# Patient Record
Sex: Male | Born: 1945 | Race: Black or African American | Hispanic: No | Marital: Married | Smoking: Former smoker
Health system: Southern US, Community
[De-identification: ages and names within clinical notes are randomized; demographics above are authoritative.]

## PROBLEM LIST (undated history)

## (undated) DIAGNOSIS — K7460 Unspecified cirrhosis of liver: Secondary | ICD-10-CM

## (undated) DIAGNOSIS — F419 Anxiety disorder, unspecified: Secondary | ICD-10-CM

## (undated) DIAGNOSIS — R0609 Other forms of dyspnea: Secondary | ICD-10-CM

## (undated) DIAGNOSIS — G4730 Sleep apnea, unspecified: Secondary | ICD-10-CM

## (undated) DIAGNOSIS — I272 Pulmonary hypertension, unspecified: Secondary | ICD-10-CM

## (undated) DIAGNOSIS — Z95810 Presence of automatic (implantable) cardiac defibrillator: Secondary | ICD-10-CM

## (undated) DIAGNOSIS — Z8489 Family history of other specified conditions: Secondary | ICD-10-CM

## (undated) DIAGNOSIS — I10 Essential (primary) hypertension: Secondary | ICD-10-CM

## (undated) DIAGNOSIS — I639 Cerebral infarction, unspecified: Secondary | ICD-10-CM

## (undated) DIAGNOSIS — E785 Hyperlipidemia, unspecified: Secondary | ICD-10-CM

## (undated) DIAGNOSIS — I2510 Atherosclerotic heart disease of native coronary artery without angina pectoris: Secondary | ICD-10-CM

## (undated) DIAGNOSIS — I219 Acute myocardial infarction, unspecified: Secondary | ICD-10-CM

## (undated) DIAGNOSIS — I255 Ischemic cardiomyopathy: Secondary | ICD-10-CM

## (undated) DIAGNOSIS — R0600 Dyspnea, unspecified: Secondary | ICD-10-CM

## (undated) DIAGNOSIS — I509 Heart failure, unspecified: Secondary | ICD-10-CM

## (undated) DIAGNOSIS — I071 Rheumatic tricuspid insufficiency: Secondary | ICD-10-CM

## (undated) DIAGNOSIS — I2699 Other pulmonary embolism without acute cor pulmonale: Secondary | ICD-10-CM

## (undated) HISTORY — PX: CORONARY ANGIOPLASTY WITH STENT PLACEMENT: SHX49

## (undated) HISTORY — DX: Other pulmonary embolism without acute cor pulmonale: I26.99

## (undated) HISTORY — DX: Ischemic cardiomyopathy: I25.5

## (undated) HISTORY — DX: Other forms of dyspnea: R06.09

## (undated) HISTORY — DX: Essential (primary) hypertension: I10

## (undated) HISTORY — DX: Rheumatic tricuspid insufficiency: I07.1

## (undated) HISTORY — DX: Hyperlipidemia, unspecified: E78.5

## (undated) HISTORY — DX: Acute myocardial infarction, unspecified: I21.9

## (undated) HISTORY — DX: Pulmonary hypertension, unspecified: I27.20

## (undated) HISTORY — DX: Atherosclerotic heart disease of native coronary artery without angina pectoris: I25.10

## (undated) HISTORY — DX: Anxiety disorder, unspecified: F41.9

## (undated) HISTORY — DX: Dyspnea, unspecified: R06.00

---

## 2001-06-05 DIAGNOSIS — I219 Acute myocardial infarction, unspecified: Secondary | ICD-10-CM

## 2001-06-05 HISTORY — DX: Acute myocardial infarction, unspecified: I21.9

## 2001-06-05 HISTORY — PX: CORONARY ARTERY BYPASS GRAFT: SHX141

## 2006-11-09 HISTORY — PX: CARDIAC DEFIBRILLATOR PLACEMENT: SHX171

## 2011-06-21 DIAGNOSIS — I252 Old myocardial infarction: Secondary | ICD-10-CM | POA: Diagnosis not present

## 2011-06-21 DIAGNOSIS — I509 Heart failure, unspecified: Secondary | ICD-10-CM | POA: Diagnosis not present

## 2011-06-21 DIAGNOSIS — I428 Other cardiomyopathies: Secondary | ICD-10-CM | POA: Diagnosis not present

## 2011-06-21 DIAGNOSIS — I5022 Chronic systolic (congestive) heart failure: Secondary | ICD-10-CM | POA: Diagnosis not present

## 2011-06-21 DIAGNOSIS — Z95810 Presence of automatic (implantable) cardiac defibrillator: Secondary | ICD-10-CM | POA: Diagnosis not present

## 2011-06-23 DIAGNOSIS — R9431 Abnormal electrocardiogram [ECG] [EKG]: Secondary | ICD-10-CM | POA: Diagnosis not present

## 2011-06-23 DIAGNOSIS — R0602 Shortness of breath: Secondary | ICD-10-CM | POA: Diagnosis not present

## 2011-06-23 DIAGNOSIS — I509 Heart failure, unspecified: Secondary | ICD-10-CM | POA: Diagnosis not present

## 2011-06-23 DIAGNOSIS — I2699 Other pulmonary embolism without acute cor pulmonale: Secondary | ICD-10-CM | POA: Diagnosis not present

## 2011-06-23 DIAGNOSIS — R079 Chest pain, unspecified: Secondary | ICD-10-CM | POA: Diagnosis not present

## 2011-06-24 DIAGNOSIS — I509 Heart failure, unspecified: Secondary | ICD-10-CM | POA: Diagnosis present

## 2011-06-24 DIAGNOSIS — I2699 Other pulmonary embolism without acute cor pulmonale: Secondary | ICD-10-CM | POA: Diagnosis not present

## 2011-06-24 DIAGNOSIS — E785 Hyperlipidemia, unspecified: Secondary | ICD-10-CM | POA: Diagnosis present

## 2011-06-24 DIAGNOSIS — I2589 Other forms of chronic ischemic heart disease: Secondary | ICD-10-CM | POA: Diagnosis present

## 2011-06-24 DIAGNOSIS — I129 Hypertensive chronic kidney disease with stage 1 through stage 4 chronic kidney disease, or unspecified chronic kidney disease: Secondary | ICD-10-CM | POA: Diagnosis present

## 2011-06-24 DIAGNOSIS — J90 Pleural effusion, not elsewhere classified: Secondary | ICD-10-CM | POA: Diagnosis not present

## 2011-06-24 DIAGNOSIS — R0602 Shortness of breath: Secondary | ICD-10-CM | POA: Diagnosis not present

## 2011-06-24 DIAGNOSIS — I2510 Atherosclerotic heart disease of native coronary artery without angina pectoris: Secondary | ICD-10-CM | POA: Diagnosis not present

## 2011-06-24 DIAGNOSIS — Z9861 Coronary angioplasty status: Secondary | ICD-10-CM | POA: Diagnosis not present

## 2011-06-24 DIAGNOSIS — N189 Chronic kidney disease, unspecified: Secondary | ICD-10-CM | POA: Diagnosis not present

## 2011-06-24 DIAGNOSIS — D689 Coagulation defect, unspecified: Secondary | ICD-10-CM | POA: Diagnosis not present

## 2011-06-24 DIAGNOSIS — I5022 Chronic systolic (congestive) heart failure: Secondary | ICD-10-CM | POA: Diagnosis present

## 2011-06-24 DIAGNOSIS — R7989 Other specified abnormal findings of blood chemistry: Secondary | ICD-10-CM | POA: Diagnosis present

## 2011-06-24 DIAGNOSIS — R071 Chest pain on breathing: Secondary | ICD-10-CM | POA: Diagnosis not present

## 2011-06-28 DIAGNOSIS — I428 Other cardiomyopathies: Secondary | ICD-10-CM | POA: Diagnosis not present

## 2011-06-28 DIAGNOSIS — I2699 Other pulmonary embolism without acute cor pulmonale: Secondary | ICD-10-CM | POA: Diagnosis not present

## 2011-06-28 DIAGNOSIS — D689 Coagulation defect, unspecified: Secondary | ICD-10-CM | POA: Diagnosis not present

## 2011-06-28 DIAGNOSIS — I10 Essential (primary) hypertension: Secondary | ICD-10-CM | POA: Diagnosis not present

## 2011-06-28 DIAGNOSIS — I2510 Atherosclerotic heart disease of native coronary artery without angina pectoris: Secondary | ICD-10-CM | POA: Diagnosis not present

## 2011-06-29 DIAGNOSIS — I428 Other cardiomyopathies: Secondary | ICD-10-CM | POA: Diagnosis not present

## 2011-06-29 DIAGNOSIS — I2699 Other pulmonary embolism without acute cor pulmonale: Secondary | ICD-10-CM | POA: Diagnosis not present

## 2011-06-29 DIAGNOSIS — I10 Essential (primary) hypertension: Secondary | ICD-10-CM | POA: Diagnosis not present

## 2011-06-29 DIAGNOSIS — I2510 Atherosclerotic heart disease of native coronary artery without angina pectoris: Secondary | ICD-10-CM | POA: Diagnosis not present

## 2011-06-29 DIAGNOSIS — D689 Coagulation defect, unspecified: Secondary | ICD-10-CM | POA: Diagnosis not present

## 2011-06-30 DIAGNOSIS — D689 Coagulation defect, unspecified: Secondary | ICD-10-CM | POA: Diagnosis not present

## 2011-06-30 DIAGNOSIS — I428 Other cardiomyopathies: Secondary | ICD-10-CM | POA: Diagnosis not present

## 2011-06-30 DIAGNOSIS — I2699 Other pulmonary embolism without acute cor pulmonale: Secondary | ICD-10-CM | POA: Diagnosis not present

## 2011-06-30 DIAGNOSIS — I10 Essential (primary) hypertension: Secondary | ICD-10-CM | POA: Diagnosis not present

## 2011-06-30 DIAGNOSIS — I2510 Atherosclerotic heart disease of native coronary artery without angina pectoris: Secondary | ICD-10-CM | POA: Diagnosis not present

## 2011-07-04 DIAGNOSIS — Z7901 Long term (current) use of anticoagulants: Secondary | ICD-10-CM | POA: Diagnosis not present

## 2011-07-11 DIAGNOSIS — Z7901 Long term (current) use of anticoagulants: Secondary | ICD-10-CM | POA: Diagnosis not present

## 2011-07-25 DIAGNOSIS — I2699 Other pulmonary embolism without acute cor pulmonale: Secondary | ICD-10-CM | POA: Diagnosis not present

## 2011-07-25 DIAGNOSIS — I2510 Atherosclerotic heart disease of native coronary artery without angina pectoris: Secondary | ICD-10-CM | POA: Diagnosis not present

## 2011-07-25 DIAGNOSIS — Z7901 Long term (current) use of anticoagulants: Secondary | ICD-10-CM | POA: Diagnosis not present

## 2011-07-25 DIAGNOSIS — R0989 Other specified symptoms and signs involving the circulatory and respiratory systems: Secondary | ICD-10-CM | POA: Diagnosis not present

## 2011-08-11 DIAGNOSIS — Z7901 Long term (current) use of anticoagulants: Secondary | ICD-10-CM | POA: Diagnosis not present

## 2011-08-14 DIAGNOSIS — Z7901 Long term (current) use of anticoagulants: Secondary | ICD-10-CM | POA: Diagnosis not present

## 2011-08-16 DIAGNOSIS — Z7901 Long term (current) use of anticoagulants: Secondary | ICD-10-CM | POA: Diagnosis not present

## 2011-09-13 DIAGNOSIS — I509 Heart failure, unspecified: Secondary | ICD-10-CM | POA: Diagnosis not present

## 2011-09-13 DIAGNOSIS — Z95810 Presence of automatic (implantable) cardiac defibrillator: Secondary | ICD-10-CM | POA: Diagnosis not present

## 2011-09-16 DIAGNOSIS — R05 Cough: Secondary | ICD-10-CM | POA: Diagnosis not present

## 2011-09-16 DIAGNOSIS — I428 Other cardiomyopathies: Secondary | ICD-10-CM | POA: Diagnosis not present

## 2011-09-16 DIAGNOSIS — I5022 Chronic systolic (congestive) heart failure: Secondary | ICD-10-CM | POA: Diagnosis not present

## 2011-09-16 DIAGNOSIS — J90 Pleural effusion, not elsewhere classified: Secondary | ICD-10-CM | POA: Diagnosis not present

## 2011-09-16 DIAGNOSIS — R0609 Other forms of dyspnea: Secondary | ICD-10-CM | POA: Diagnosis not present

## 2011-09-22 DIAGNOSIS — Z7901 Long term (current) use of anticoagulants: Secondary | ICD-10-CM | POA: Diagnosis not present

## 2011-09-22 DIAGNOSIS — I428 Other cardiomyopathies: Secondary | ICD-10-CM | POA: Diagnosis not present

## 2011-09-22 DIAGNOSIS — I5022 Chronic systolic (congestive) heart failure: Secondary | ICD-10-CM | POA: Diagnosis not present

## 2011-09-22 DIAGNOSIS — J069 Acute upper respiratory infection, unspecified: Secondary | ICD-10-CM | POA: Diagnosis not present

## 2011-09-22 DIAGNOSIS — R0989 Other specified symptoms and signs involving the circulatory and respiratory systems: Secondary | ICD-10-CM | POA: Diagnosis not present

## 2011-10-04 DIAGNOSIS — I428 Other cardiomyopathies: Secondary | ICD-10-CM | POA: Diagnosis not present

## 2011-10-04 DIAGNOSIS — R05 Cough: Secondary | ICD-10-CM | POA: Diagnosis not present

## 2011-10-04 DIAGNOSIS — I5022 Chronic systolic (congestive) heart failure: Secondary | ICD-10-CM | POA: Diagnosis not present

## 2011-11-06 DIAGNOSIS — I5022 Chronic systolic (congestive) heart failure: Secondary | ICD-10-CM | POA: Diagnosis not present

## 2011-11-06 DIAGNOSIS — I10 Essential (primary) hypertension: Secondary | ICD-10-CM | POA: Diagnosis not present

## 2011-11-06 DIAGNOSIS — Z7901 Long term (current) use of anticoagulants: Secondary | ICD-10-CM | POA: Diagnosis not present

## 2011-11-06 DIAGNOSIS — I428 Other cardiomyopathies: Secondary | ICD-10-CM | POA: Diagnosis not present

## 2011-11-11 DIAGNOSIS — R0989 Other specified symptoms and signs involving the circulatory and respiratory systems: Secondary | ICD-10-CM | POA: Diagnosis not present

## 2011-11-11 DIAGNOSIS — J40 Bronchitis, not specified as acute or chronic: Secondary | ICD-10-CM | POA: Diagnosis not present

## 2011-11-11 DIAGNOSIS — I517 Cardiomegaly: Secondary | ICD-10-CM | POA: Diagnosis not present

## 2011-11-11 DIAGNOSIS — R935 Abnormal findings on diagnostic imaging of other abdominal regions, including retroperitoneum: Secondary | ICD-10-CM | POA: Diagnosis not present

## 2011-11-11 DIAGNOSIS — R918 Other nonspecific abnormal finding of lung field: Secondary | ICD-10-CM | POA: Diagnosis not present

## 2011-11-11 DIAGNOSIS — R948 Abnormal results of function studies of other organs and systems: Secondary | ICD-10-CM | POA: Diagnosis not present

## 2011-11-11 DIAGNOSIS — R0609 Other forms of dyspnea: Secondary | ICD-10-CM | POA: Diagnosis not present

## 2011-11-11 DIAGNOSIS — J90 Pleural effusion, not elsewhere classified: Secondary | ICD-10-CM | POA: Diagnosis not present

## 2011-11-11 DIAGNOSIS — R6889 Other general symptoms and signs: Secondary | ICD-10-CM | POA: Diagnosis not present

## 2011-11-11 DIAGNOSIS — I509 Heart failure, unspecified: Secondary | ICD-10-CM | POA: Diagnosis not present

## 2011-11-11 DIAGNOSIS — R188 Other ascites: Secondary | ICD-10-CM | POA: Diagnosis not present

## 2011-11-12 DIAGNOSIS — R112 Nausea with vomiting, unspecified: Secondary | ICD-10-CM | POA: Diagnosis not present

## 2011-11-12 DIAGNOSIS — R569 Unspecified convulsions: Secondary | ICD-10-CM | POA: Diagnosis not present

## 2011-11-13 DIAGNOSIS — I129 Hypertensive chronic kidney disease with stage 1 through stage 4 chronic kidney disease, or unspecified chronic kidney disease: Secondary | ICD-10-CM | POA: Diagnosis not present

## 2011-11-13 DIAGNOSIS — R55 Syncope and collapse: Secondary | ICD-10-CM | POA: Diagnosis not present

## 2011-11-13 DIAGNOSIS — I951 Orthostatic hypotension: Secondary | ICD-10-CM | POA: Diagnosis not present

## 2011-11-13 DIAGNOSIS — F411 Generalized anxiety disorder: Secondary | ICD-10-CM | POA: Diagnosis present

## 2011-11-13 DIAGNOSIS — R197 Diarrhea, unspecified: Secondary | ICD-10-CM | POA: Diagnosis not present

## 2011-11-13 DIAGNOSIS — K5289 Other specified noninfective gastroenteritis and colitis: Secondary | ICD-10-CM | POA: Diagnosis present

## 2011-11-13 DIAGNOSIS — E785 Hyperlipidemia, unspecified: Secondary | ICD-10-CM | POA: Diagnosis present

## 2011-11-13 DIAGNOSIS — Z951 Presence of aortocoronary bypass graft: Secondary | ICD-10-CM | POA: Diagnosis not present

## 2011-11-13 DIAGNOSIS — F329 Major depressive disorder, single episode, unspecified: Secondary | ICD-10-CM | POA: Diagnosis present

## 2011-11-13 DIAGNOSIS — R1909 Other intra-abdominal and pelvic swelling, mass and lump: Secondary | ICD-10-CM | POA: Diagnosis not present

## 2011-11-13 DIAGNOSIS — R404 Transient alteration of awareness: Secondary | ICD-10-CM | POA: Diagnosis not present

## 2011-11-13 DIAGNOSIS — Z7901 Long term (current) use of anticoagulants: Secondary | ICD-10-CM | POA: Diagnosis not present

## 2011-11-13 DIAGNOSIS — Z9861 Coronary angioplasty status: Secondary | ICD-10-CM | POA: Diagnosis not present

## 2011-11-13 DIAGNOSIS — I959 Hypotension, unspecified: Secondary | ICD-10-CM | POA: Diagnosis not present

## 2011-11-13 DIAGNOSIS — Z8673 Personal history of transient ischemic attack (TIA), and cerebral infarction without residual deficits: Secondary | ICD-10-CM | POA: Diagnosis not present

## 2011-11-13 DIAGNOSIS — Z86711 Personal history of pulmonary embolism: Secondary | ICD-10-CM | POA: Diagnosis not present

## 2011-11-13 DIAGNOSIS — I2510 Atherosclerotic heart disease of native coronary artery without angina pectoris: Secondary | ICD-10-CM | POA: Diagnosis present

## 2011-11-13 DIAGNOSIS — Z95810 Presence of automatic (implantable) cardiac defibrillator: Secondary | ICD-10-CM | POA: Diagnosis not present

## 2011-11-13 DIAGNOSIS — N179 Acute kidney failure, unspecified: Secondary | ICD-10-CM | POA: Diagnosis not present

## 2011-11-13 DIAGNOSIS — R0602 Shortness of breath: Secondary | ICD-10-CM | POA: Diagnosis not present

## 2011-11-13 DIAGNOSIS — R188 Other ascites: Secondary | ICD-10-CM | POA: Diagnosis not present

## 2011-11-13 DIAGNOSIS — R1110 Vomiting, unspecified: Secondary | ICD-10-CM | POA: Diagnosis not present

## 2011-11-13 DIAGNOSIS — I509 Heart failure, unspecified: Secondary | ICD-10-CM | POA: Diagnosis present

## 2011-11-13 DIAGNOSIS — R112 Nausea with vomiting, unspecified: Secondary | ICD-10-CM | POA: Diagnosis not present

## 2011-11-13 DIAGNOSIS — I2589 Other forms of chronic ischemic heart disease: Secondary | ICD-10-CM | POA: Diagnosis not present

## 2011-11-17 DIAGNOSIS — I2699 Other pulmonary embolism without acute cor pulmonale: Secondary | ICD-10-CM | POA: Diagnosis not present

## 2011-11-20 DIAGNOSIS — R55 Syncope and collapse: Secondary | ICD-10-CM | POA: Diagnosis not present

## 2011-11-21 ENCOUNTER — Encounter: Payer: Self-pay | Admitting: Cardiology

## 2011-11-21 ENCOUNTER — Telehealth: Payer: Self-pay | Admitting: *Deleted

## 2011-11-21 ENCOUNTER — Ambulatory Visit (INDEPENDENT_AMBULATORY_CARE_PROVIDER_SITE_OTHER): Payer: Medicare Other | Admitting: Cardiology

## 2011-11-21 VITALS — BP 114/75 | HR 55 | Ht 71.0 in | Wt 202.8 lb

## 2011-11-21 DIAGNOSIS — I428 Other cardiomyopathies: Secondary | ICD-10-CM

## 2011-11-21 DIAGNOSIS — I255 Ischemic cardiomyopathy: Secondary | ICD-10-CM

## 2011-11-21 DIAGNOSIS — I2699 Other pulmonary embolism without acute cor pulmonale: Secondary | ICD-10-CM

## 2011-11-21 DIAGNOSIS — E785 Hyperlipidemia, unspecified: Secondary | ICD-10-CM

## 2011-11-21 DIAGNOSIS — I10 Essential (primary) hypertension: Secondary | ICD-10-CM | POA: Diagnosis not present

## 2011-11-21 DIAGNOSIS — Z95810 Presence of automatic (implantable) cardiac defibrillator: Secondary | ICD-10-CM | POA: Diagnosis not present

## 2011-11-21 DIAGNOSIS — Z8679 Personal history of other diseases of the circulatory system: Secondary | ICD-10-CM | POA: Diagnosis not present

## 2011-11-21 DIAGNOSIS — Z4502 Encounter for adjustment and management of automatic implantable cardiac defibrillator: Secondary | ICD-10-CM | POA: Insufficient documentation

## 2011-11-21 DIAGNOSIS — I2589 Other forms of chronic ischemic heart disease: Secondary | ICD-10-CM

## 2011-11-21 DIAGNOSIS — I429 Cardiomyopathy, unspecified: Secondary | ICD-10-CM

## 2011-11-21 DIAGNOSIS — R0609 Other forms of dyspnea: Secondary | ICD-10-CM | POA: Diagnosis not present

## 2011-11-21 NOTE — Assessment & Plan Note (Signed)
I will arrange for his followup in our EP clinic.

## 2011-11-21 NOTE — Progress Notes (Signed)
HPI The patient is presenting as a new patient for followup of an apparent ischemic cardiomyopathy. He has a history of distant bypass grafting done [place]. He's also had stenting in the past. I don't have a record of the bypass grafts but he reports 4 vessels. He doesn't think he's had a cardiac catheterization since that time. He was in the hospital in [place] last month or syncope and I was able to obtain and review these records. The etiology of this event was thought possibly to be related to hypotension. He did have an echocardiogram which demonstrated severe global hypokinesis, severe tricuspid regurgitation an EF of about 20%. This was apparently not new. He has had an ICD placed. Of note he's also on Coumadin for pulmonary emboli which he thinks was last November.  Since discharge he says he's had no further episodes. He does not describe presyncope or syncope. He doesn't notice palpitations. He has had no chest pressure, neck or arm discomfort. He has had chronic dyspnea with exertion  No Known Allergies  Current Outpatient Prescriptions  Medication Sig Dispense Refill  . aspirin 325 MG tablet Take 325 mg by mouth daily.      . carvedilol (COREG) 25 MG tablet Take 25 mg by mouth 2 (two) times daily with a meal.      . doxycycline (VIBRAMYCIN) 100 MG capsule Take 100 mg by mouth 2 (two) times daily. Patient will finish this medicine in 2-3 days.      [name] FLUoxetine (PROZAC) 40 MG capsule Take 40 mg by mouth daily.      . furosemide (LASIX) 40 MG tablet Take 40 mg by mouth daily.      . isosorbide mononitrate (IMDUR) 30 MG 24 hr tablet Take 30 mg by mouth daily.      [name] omeprazole (PRILOSEC) 20 MG capsule Take 20 mg by mouth daily.      . potassium chloride (K-DUR) 10 MEQ tablet Take 10 mEq by mouth daily.      . WARFARIN SODIUM PO Take by mouth as directed.        Past Medical History  Diagnosis Date  . Essential hypertension   . H/O atherosclerotic cardiovascular disease   .  Myocardial infarction acute   . Cardiomyopathy   . Other pulmonary embolism and infarction   . Disorder of lipoid metabolism   . DOE (dyspnea on exertion)   . Chronic systolic heart failure     Past Surgical History  Procedure Date  . Cardiac catheterization     Family History  Problem Relation Age of Onset  . Heart disease Mother   . Kidney disease Father     History   Social History  . Marital Status: Married    Spouse Name: N/A    Number of Children: N/A  . Years of Education: N/A   Occupational History  . Not on file.   Social History Main Topics  . Smoking status: Former Games developer  . Smokeless tobacco: Not on file  . Alcohol Use: No  . Drug Use: No  . Sexually Active: Not on file   Other Topics Concern  . Not on file   Social History Narrative  . No narrative on file    ROS:  As stated in the HPI and negative for all other systems.  PHYSICAL EXAM BP 114/75  Pulse 55  Ht 5\' 11"  (1.803 m)  Wt 202 lb 12.8 oz (91.989 kg)  BMI 28.28 kg/m2 GENERAL:  Well appearing  HEENT:  Pupils equal round and reactive, fundi not visualized, oral mucosa unremarkable, dentures NECK:  Jugular venous distention to the jaw with a CV wave, waveform within normal limits, carotid upstroke brisk and symmetric, no bruits, no thyromegaly LYMPHATICS:  No cervical, inguinal adenopathy LUNGS:  Clear to auscultation bilaterally BACK:  No CVA tenderness CHEST:  Well healed ICD pocket, Well healed sternotomy scar. HEART:  PMI not displaced or sustained,S1 and S2 within normal limits, no S3, no S4, no clicks, no rubs, 3/6 left mid sternal border holosystolic murmurs ABD:  Flat, positive bowel sounds normal in frequency in pitch, no bruits, no rebound, no guarding, no midline pulsatile mass, no hepatomegaly, no splenomegaly EXT:  2 plus pulses throughout, no edema, no cyanosis no clubbing, absent left radial SKIN:  No rashes no nodules NEURO:  Cranial nerves II through XII grossly intact,  motor grossly intact throughout PSYCH:  Cognitively intact, oriented to person place and time  EKG:  Ventricular paced rhythm rate 55. 11/21/2011  ASSESSMENT AND PLAN

## 2011-11-21 NOTE — Assessment & Plan Note (Signed)
His blood pressure is controlled on the medications as listed. He will continue on this therapy.

## 2011-11-21 NOTE — Patient Instructions (Addendum)
Your physician recommends that you schedule a follow-up appointment in:1 month with Dr. Antoine Poche.  Your physician recommends that you continue on your current medications as directed. Please refer to the Current Medication list given to you today.   We are requesting records from [facility].  You will get established here at our device clinic.

## 2011-11-21 NOTE — Assessment & Plan Note (Signed)
I have been able to review the patient's [facility] records and understand his ejection fraction. I would like to check a BNP level to further understand his dyspnea. He may also need stress perfusion imaging in the future though I will discuss this with him when he returns. For now he will remain on the medications as listed.

## 2011-11-21 NOTE — Telephone Encounter (Signed)
Patient's wife informed and lab order faxed to [facility] lab.

## 2011-11-21 NOTE — Telephone Encounter (Signed)
Message copied by Eustace Moore on Tue Nov 21, 2011  2:42 PM ------      Message from: Rollene Rotunda      Created: Tue Nov 21, 2011  2:00 PM       He needs a BNP level.  I would like other records from [place] (?Nov of last year).  He was in the hospital with a PE at around that time.

## 2011-11-21 NOTE — Assessment & Plan Note (Signed)
I will try to obtain records from [place] concerning his pulmonary embolism to determine the duration of therapy.

## 2011-11-21 NOTE — Assessment & Plan Note (Signed)
This will be evaluated as above. 

## 2011-11-27 ENCOUNTER — Encounter: Payer: Self-pay | Admitting: Cardiology

## 2011-12-11 ENCOUNTER — Ambulatory Visit: Payer: Self-pay | Admitting: Cardiology

## 2011-12-18 ENCOUNTER — Ambulatory Visit (INDEPENDENT_AMBULATORY_CARE_PROVIDER_SITE_OTHER): Payer: Medicare Other | Admitting: *Deleted

## 2011-12-18 ENCOUNTER — Encounter: Payer: Self-pay | Admitting: Cardiology

## 2011-12-18 ENCOUNTER — Ambulatory Visit (INDEPENDENT_AMBULATORY_CARE_PROVIDER_SITE_OTHER): Payer: Medicare Other | Admitting: Cardiology

## 2011-12-18 VITALS — BP 133/76 | HR 67 | Ht 71.0 in | Wt 202.1 lb

## 2011-12-18 DIAGNOSIS — I2699 Other pulmonary embolism without acute cor pulmonale: Secondary | ICD-10-CM

## 2011-12-18 DIAGNOSIS — E785 Hyperlipidemia, unspecified: Secondary | ICD-10-CM

## 2011-12-18 DIAGNOSIS — I255 Ischemic cardiomyopathy: Secondary | ICD-10-CM

## 2011-12-18 DIAGNOSIS — I2589 Other forms of chronic ischemic heart disease: Secondary | ICD-10-CM | POA: Diagnosis not present

## 2011-12-18 DIAGNOSIS — Z7901 Long term (current) use of anticoagulants: Secondary | ICD-10-CM | POA: Insufficient documentation

## 2011-12-18 DIAGNOSIS — I10 Essential (primary) hypertension: Secondary | ICD-10-CM

## 2011-12-18 MED ORDER — LISINOPRIL 2.5 MG PO TABS: 2.5000 mg | ORAL_TABLET | Freq: Every day | ORAL | Status: DC

## 2011-12-18 MED ORDER — CARVEDILOL 6.25 MG PO TABS: 6.2500 mg | ORAL_TABLET | Freq: Two times a day (BID) | ORAL | Status: DC

## 2011-12-18 NOTE — Patient Instructions (Addendum)
Your physician recommends that you schedule a follow-up appointment on 12/25/11 @2 :00pm with Gene Serpe.  Your physician has recommended you make the following change in your medication: increase carvedilol 6.25 mg twice daily. You may take 2 of your 3.125 mg twice daily until they are finished.  Start lisinopril 2.5 mg daily.   Please expect a call from our coumadin nurse today. If you don't receive this call, please contact our office.

## 2011-12-18 NOTE — Progress Notes (Signed)
HPI The patient is presenting as a new patient for followup of an ischemic cardiomyopathy. He has a history of distant bypass grafting done [place]. He's also had stenting in the past.  He has severe tricuspid regurgitation with an EF of about 20%.  Since I last saw him he has had no new cardiovascular complaints. His wife seems to describe some Cheyne-Stokes respirations. However, he has no new exertional dyspnea, PND or orthopnea. He's not had any further syncopal episodes. He doesn't notice any palpitations, presyncope. He's had no chest pressure, neck or arm discomfort. He has no weight gain or edema. It does seem like he watches his salt somewhat.   No Known Allergies  Current Outpatient Prescriptions  Medication Sig Dispense Refill  . aspirin 325 MG tablet Take 325 mg by mouth daily.      . carvedilol (COREG) 3.125 MG tablet Take 3.125 mg by mouth 2 (two) times daily with a meal.      . FLUoxetine (PROZAC) 40 MG capsule Take 40 mg by mouth daily.      . furosemide (LASIX) 40 MG tablet Take 40 mg by mouth daily.      . isosorbide mononitrate (IMDUR) 30 MG 24 hr tablet Take 30 mg by mouth daily.      . potassium chloride (K-DUR) 10 MEQ tablet Take 10 mEq by mouth daily.      . WARFARIN SODIUM PO Take by mouth as directed.        Past Medical History  Diagnosis Date  . Essential hypertension   . H/O atherosclerotic cardiovascular disease   . Myocardial infarction acute   . Ischemic cardiomyopathy     20% he echo and rule 2013  . Other pulmonary embolism and infarction   . Hyperlipidemia   . DOE (dyspnea on exertion)   . CAD (coronary artery disease)   . CVA (cerebral infarction)   . Anxiety   . Tricuspid regurgitation     Severe  . Pulmonary hypertension     56 mm Hg    Past Surgical History  Procedure Date  . Coronary artery bypass graft 2003    4 vessel  . ICD     ROS:  As stated in the HPI and negative for all other systems.  PHYSICAL EXAM BP 133/76   Pulse 67  Ht 5\' 11"  (1.803 m)  Wt 202 lb 1.9 oz (91.681 kg)  BMI 28.19 kg/m2  SpO2 100% GENERAL:  Well appearing HEENT:  Pupils equal round and reactive, fundi not visualized, oral mucosa unremarkable, dentures NECK:  Jugular venous distention to the jaw with a CV wave, waveform within normal limits, carotid upstroke brisk and symmetric, no bruits, no thyromegaly LYMPHATICS:  No cervical, inguinal adenopathy LUNGS:  Clear to auscultation bilaterally BACK:  No CVA tenderness CHEST:  Well healed ICD pocket, Well healed sternotomy scar. HEART:  PMI not displaced or sustained,S1 and S2 within normal limits, no S3, no S4, no clicks, no rubs, 3/6 left mid sternal border holosystolic murmurs ABD:  Flat, positive bowel sounds normal in frequency in pitch, no bruits, no rebound, no guarding, no midline pulsatile mass, no hepatomegaly, no splenomegaly EXT:  2 plus pulses throughout, no edema, no cyanosis no clubbing, absent left radial SKIN:  No rashes no nodules NEURO:  Cranial nerves II through XII grossly intact, motor grossly intact throughout PSYCH:  Cognitively intact, oriented to person place and time  ASSESSMENT AND PLAN  Ischemic cardiomyopathy -   Today I  am going to increase his carvedilol to 6.25 mg twice a day. I'm going to also as lisinopril 2.5 mg daily. His last creatinine was 1.36. I will check a basic metabolic profile in one week. We will continue with med titration. At this point I don't see need for ischemia workup.  Other pulmonary embolism and infarction -  At this point with a severely reduced ejection fraction and this history  I will continue his warfarin. It turns out he has not had this checked is relocating and we will get him into our Coumadin clinic as soon as possible.  Essential hypertension -  This is being managed in the context of treating his CHF  H/O atherosclerotic cardiovascular disease -  The patient has no new sypmtoms.  No further cardiovascular testing  is indicated.  We will continue with aggressive risk reduction and meds as listed.  ICD (implantable cardiac defibrillator) battery depletion -  The patient has follow up as a new ICD patient in August

## 2011-12-25 ENCOUNTER — Ambulatory Visit (INDEPENDENT_AMBULATORY_CARE_PROVIDER_SITE_OTHER): Payer: Medicare Other | Admitting: Physician Assistant

## 2011-12-25 ENCOUNTER — Encounter: Payer: Self-pay | Admitting: Physician Assistant

## 2011-12-25 VITALS — BP 90/60 | HR 60 | Ht 71.0 in | Wt 205.1 lb

## 2011-12-25 DIAGNOSIS — I5022 Chronic systolic (congestive) heart failure: Secondary | ICD-10-CM | POA: Diagnosis not present

## 2011-12-25 DIAGNOSIS — R0609 Other forms of dyspnea: Secondary | ICD-10-CM | POA: Diagnosis not present

## 2011-12-25 DIAGNOSIS — I10 Essential (primary) hypertension: Secondary | ICD-10-CM | POA: Diagnosis not present

## 2011-12-25 DIAGNOSIS — Z79899 Other long term (current) drug therapy: Secondary | ICD-10-CM | POA: Diagnosis not present

## 2011-12-25 MED ORDER — ASPIRIN EC 81 MG PO TBEC: 81.0000 mg | DELAYED_RELEASE_TABLET | Freq: Every day | ORAL | Status: DC

## 2011-12-25 MED ORDER — POTASSIUM CHLORIDE CRYS ER 20 MEQ PO TBCR: 20.0000 meq | EXTENDED_RELEASE_TABLET | Freq: Every day | ORAL | Status: DC

## 2011-12-25 MED ORDER — FUROSEMIDE 40 MG PO TABS: 60.0000 mg | ORAL_TABLET | Freq: Every day | ORAL | Status: DC

## 2011-12-25 NOTE — Assessment & Plan Note (Signed)
Unable to further uptitrate carvedilol or ACE inhibitor, secondary to relative hypotension. We'll decrease ASA to 81 mg daily, given that patient is on  Coumadin. However, will increase Lasix to 60 mg daily for management of volume overload and chronic SHF. Will check followup labs today, with repeat BMET/BNP level in one week. We'll reassess clinical status in the following 2-3 weeks.

## 2011-12-25 NOTE — Progress Notes (Signed)
Primary Cardiologist: Rollene Rotunda, MD   HPI: Patient presents for early scheduled followup following recent referral to Dr. Antoine Poche, with whom he established. He presents with severe ICM (EF 20%), status post remote CABG, ICD implantation, and status post pulmonary embolism, earlier this year. He has since been enrolled in our Coumadin clinic. He is also scheduled to establish with Dr. Johney Frame, in the next few weeks.  Medication adjustments notable for up titration of carvedilol, and addition of lisinopril. He has not yet had followup labs.  Clinically, he reports some weight gain, but denies dietary sodium indiscretion or excessive fluid intake. He denies CP, tachycardia palpitations, syncope, or ICD discharges. He does have significant DOE with walking on level ground. He denies PND, orthopnea, or LE edema exacerbation.   No Known Allergies  Current Outpatient Prescriptions  Medication Sig Dispense Refill  . carvedilol (COREG) 6.25 MG tablet Take 1 tablet (6.25 mg total) by mouth 2 (two) times daily with a meal.  60 tablet  3  . FLUoxetine (PROZAC) 40 MG capsule Take 40 mg by mouth daily.      . furosemide (LASIX) 40 MG tablet Take 1.5 tablets (60 mg total) by mouth daily.  45 tablet  6  . isosorbide mononitrate (IMDUR) 30 MG 24 hr tablet Take 30 mg by mouth daily.      [name] lisinopril (PRINIVIL,ZESTRIL) 2.5 MG tablet Take 1 tablet (2.5 mg total) by mouth daily.  30 tablet  3  . warfarin (COUMADIN) 3 MG tablet Take 3 mg by mouth as directed. 1 daily except M-W-F take 1/2      . DISCONTD: furosemide (LASIX) 40 MG tablet Take 40 mg by mouth daily.      [name] aspirin EC 81 MG tablet Take 1 tablet (81 mg total) by mouth daily.      . potassium chloride SA (K-DUR,KLOR-CON) 20 MEQ tablet Take 1 tablet (20 mEq total) by mouth daily.  30 tablet  6    Past Medical History  Diagnosis Date  . Essential hypertension   . H/O atherosclerotic cardiovascular disease   . Myocardial infarction acute   .  Ischemic cardiomyopathy     20% he echo and rule 2013  . Other pulmonary embolism and infarction   . Hyperlipidemia   . DOE (dyspnea on exertion)   . CAD (coronary artery disease)   . CVA (cerebral infarction)   . Anxiety   . Tricuspid regurgitation     Severe  . Pulmonary hypertension     56 mm Hg    Past Surgical History  Procedure Date  . Coronary artery bypass graft 2003    4 vessel  . Icd     History   Social History  . Marital Status: Married    Spouse Name: N/A    Number of Children: 4  . Years of Education: N/A   Occupational History  . Disabled.    Social History Main Topics  . Smoking status: Former Games developer  . Smokeless tobacco: Not on file  . Alcohol Use: No  . Drug Use: No  . Sexually Active: Not on file   Other Topics Concern  . Not on file   Social History Narrative   Lives with wife and brother in [place] and grandson.      Family History  Problem Relation Age of Onset  . Heart disease Mother     CAD  . Leukemia Father     ROS: no nausea, vomiting; no fever, chills; no  melena, hematochezia; no claudication  PHYSICAL EXAM: BP 90/60  Pulse 60  Ht 5\' 11"  (1.803 m)  Wt 205 lb 1.9 oz (93.042 kg)  BMI 28.61 kg/m2 GENERAL: 66 year-old male, sitting upright; NAD HEENT: NCAT, PERRLA, EOMI; sclera clear; no xanthelasma NECK: no bruits; marked JVD noted on the right LUNGS: Diminished breath sounds, no crackles/wheezes CARDIAC: RRR (S1, S2); no significant murmurs; no rubs or gallops ABDOMEN: soft, non-tender; intact BS EXTREMETIES: 1+ bilateral peripheral edema SKIN: warm/dry; no obvious rash/lesions MUSCULOSKELETAL: no joint deformity NEURO: no focal deficit; NL affect   EKG:    ASSESSMENT & PLAN:  Ischemic cardiomyopathy Unable to further uptitrate carvedilol or ACE inhibitor, secondary to relative hypotension. We'll decrease ASA to 81 mg daily, given that patient is on  Coumadin. However, will increase Lasix to 60 mg daily for  management of volume overload and chronic SHF. Will check followup labs today, with repeat BMET/BNP level in one week. We'll reassess clinical status in the following 2-3 weeks.  ICD (implantable cardiac defibrillator) battery depletion Patient is scheduled to establish with Dr. Hillis Range of our device clinic on August 7.    Gene Dalylah Ramey, PAC

## 2011-12-25 NOTE — Assessment & Plan Note (Signed)
Patient is scheduled to establish with Dr. Hillis Range of our device clinic on August 7.

## 2011-12-25 NOTE — Patient Instructions (Signed)
   Labs:  TODAY - BMET, BNP, CBC, TSH   Labs:  Due in 7-10 days - BMET, BNP  Office will contact with results  Weigh daily  Decrease Aspirin to 81mg  daily  No added salt  Increase Lasix to 60mg  daily  Increase Potassium to daily Follow up in  2 weeks

## 2011-12-27 ENCOUNTER — Telehealth: Payer: Self-pay | Admitting: *Deleted

## 2011-12-27 NOTE — Telephone Encounter (Signed)
Notes Recorded by Lesle Chris, LPN on 1/61/0960 at 2:04 PM Patient notified and verbalized understanding.

## 2011-12-27 NOTE — Telephone Encounter (Signed)
Message copied by Lesle Chris on Wed Dec 27, 2011  2:04 PM ------      Message from: Rande Brunt      Created: Tue Dec 26, 2011 11:32 AM       NL K, TSH, and renal fxn. BNP markedly elevated at 3200 (2300, 6/13). Lasix increased to total 60 daily, at OV yesterday. F/u labs ordered. Will need close monitoring for worsening sxs. He should have early f/u OV scheduled with me, but may need intervention sooner if BNP/weight does not improve with current Lasix dose.

## 2012-01-02 ENCOUNTER — Ambulatory Visit (INDEPENDENT_AMBULATORY_CARE_PROVIDER_SITE_OTHER): Payer: Medicare Other | Admitting: *Deleted

## 2012-01-02 DIAGNOSIS — I10 Essential (primary) hypertension: Secondary | ICD-10-CM | POA: Diagnosis not present

## 2012-01-02 DIAGNOSIS — I2699 Other pulmonary embolism without acute cor pulmonale: Secondary | ICD-10-CM | POA: Diagnosis not present

## 2012-01-02 DIAGNOSIS — I5022 Chronic systolic (congestive) heart failure: Secondary | ICD-10-CM | POA: Diagnosis not present

## 2012-01-02 DIAGNOSIS — Z7901 Long term (current) use of anticoagulants: Secondary | ICD-10-CM

## 2012-01-02 DIAGNOSIS — R0609 Other forms of dyspnea: Secondary | ICD-10-CM | POA: Diagnosis not present

## 2012-01-02 DIAGNOSIS — Z79899 Other long term (current) drug therapy: Secondary | ICD-10-CM | POA: Diagnosis not present

## 2012-01-02 LAB — POCT INR: INR: 3.4

## 2012-01-08 ENCOUNTER — Ambulatory Visit (INDEPENDENT_AMBULATORY_CARE_PROVIDER_SITE_OTHER): Payer: Medicare Other | Admitting: Physician Assistant

## 2012-01-08 ENCOUNTER — Encounter: Payer: Self-pay | Admitting: Physician Assistant

## 2012-01-08 VITALS — BP 115/80 | HR 74 | Ht 71.0 in | Wt 198.8 lb

## 2012-01-08 DIAGNOSIS — I2589 Other forms of chronic ischemic heart disease: Secondary | ICD-10-CM | POA: Diagnosis not present

## 2012-01-08 DIAGNOSIS — Z7901 Long term (current) use of anticoagulants: Secondary | ICD-10-CM | POA: Diagnosis not present

## 2012-01-08 DIAGNOSIS — R0602 Shortness of breath: Secondary | ICD-10-CM

## 2012-01-08 DIAGNOSIS — E785 Hyperlipidemia, unspecified: Secondary | ICD-10-CM | POA: Diagnosis not present

## 2012-01-08 DIAGNOSIS — I255 Ischemic cardiomyopathy: Secondary | ICD-10-CM

## 2012-01-08 DIAGNOSIS — Z4502 Encounter for adjustment and management of automatic implantable cardiac defibrillator: Secondary | ICD-10-CM | POA: Diagnosis not present

## 2012-01-08 MED ORDER — LISINOPRIL 2.5 MG PO TABS: 2.5000 mg | ORAL_TABLET | Freq: Two times a day (BID) | ORAL | Status: DC

## 2012-01-08 NOTE — Assessment & Plan Note (Signed)
Patient currently not on a statin. Will assess lipid status, with further recommendations to follow.

## 2012-01-08 NOTE — Assessment & Plan Note (Addendum)
Continue scheduled followup in our Coumadin clinic. Following review Dr. Antoine Poche, ASA will be discontinued.

## 2012-01-08 NOTE — Assessment & Plan Note (Signed)
Patient is scheduled to establish with Dr. Hillis Range, this week.

## 2012-01-08 NOTE — Progress Notes (Addendum)
Primary Cardiologist: Rollene Rotunda, MD   HPI: Patient returns for early scheduled office followup.   Lasix was increased to 60 daily. Baseline labs: NL K, TSH, and renal fxn. BNP markedly elevated at 3200 (2300, 6/13). Repeat labs, July 30: Improved renal function with BUN 17, creatinine 1.1. Potassium 4.1. BNP slightly improved at 3000.  Clinically, he is reporting much less DOE. He has lost 7 pounds since last OV. He denies PND, orthopnea, and his peripheral edema has remained stable. He denies CP, taking palpitations, syncope, or ICD discharges.  He denies any overt bleeding, but has had some mild epistaxis. No hematochezia or melena. He has had intermittent redness of his right eye.  No Known Allergies  Current Outpatient Prescriptions  Medication Sig Dispense Refill  . aspirin EC 81 MG tablet Take 1 tablet (81 mg total) by mouth daily.      . carvedilol (COREG) 6.25 MG tablet Take 1 tablet (6.25 mg total) by mouth 2 (two) times daily with a meal.  60 tablet  3  . FLUoxetine (PROZAC) 40 MG capsule Take 40 mg by mouth daily.      . furosemide (LASIX) 40 MG tablet Take 1.5 tablets (60 mg total) by mouth daily.  45 tablet  6  . isosorbide mononitrate (IMDUR) 30 MG 24 hr tablet Take 30 mg by mouth daily.      [name] lisinopril (PRINIVIL,ZESTRIL) 2.5 MG tablet Take 1 tablet (2.5 mg total) by mouth daily.  30 tablet  3  . potassium chloride SA (K-DUR,KLOR-CON) 20 MEQ tablet Take 1 tablet (20 mEq total) by mouth daily.  30 tablet  6  . warfarin (COUMADIN) 3 MG tablet Take 3 mg by mouth as directed. 1 daily except M-W-F take 1/2        Past Medical History  Diagnosis Date  . Essential hypertension   . H/O atherosclerotic cardiovascular disease   . Myocardial infarction acute   . Ischemic cardiomyopathy     20% he echo and rule 2013  . Other pulmonary embolism and infarction   . Hyperlipidemia   . DOE (dyspnea on exertion)   . CAD (coronary artery disease)   . CVA (cerebral infarction)     . Anxiety   . Tricuspid regurgitation     Severe  . Pulmonary hypertension     56 mm Hg    Past Surgical History  Procedure Date  . Coronary artery bypass graft 2003    4 vessel  . Icd     History   Social History  . Marital Status: Married    Spouse Name: N/A    Number of Children: 4  . Years of Education: N/A   Occupational History  . Disabled.    Social History Main Topics  . Smoking status: Former Games developer  . Smokeless tobacco: Not on file  . Alcohol Use: No  . Drug Use: No  . Sexually Active: Not on file   Other Topics Concern  . Not on file   Social History Narrative   Lives with wife and brother in [place] and grandson.      Family History  Problem Relation Age of Onset  . Heart disease Mother     CAD  . Leukemia Father     ROS: no nausea, vomiting; no fever, chills; no melena, hematochezia; no claudication  PHYSICAL EXAM: BP 115/80  Pulse 74  Ht 5\' 11"  (1.803 m)  Wt 198 lb 12 oz (90.152 kg)  BMI 27.72 kg/m2 GENERAL:  66 year-old male, sitting upright; NAD  HEENT: NCAT, PERRLA, EOMI; sclera clear; no xanthelasma  NECK: no bruits; marked JVD noted on the right  LUNGS: Diminished breath sounds, no crackles/wheezes  CARDIAC: RRR (S1, S2); 213/6 holosystolic murmur at the base; no rubs or gallops  ABDOMEN: soft, non-tender; intact BS  EXTREMETIES: 1+ bilateral peripheral edema  SKIN: warm/dry; no obvious rash/lesions  MUSCULOSKELETAL: no joint deformity  NEURO: no focal deficit; NL affect   EKG:    ASSESSMENT & PLAN:  Ischemic cardiomyopathy Will proceed with medication titration, this time with lisinopril to twice a day dosing regimen. We'll continue carvedilol at current dose. If blood pressure remains stable, would attempt to further uptitrate carvedilol to target 25 twice a day, if BP remains stable. Will order followup metabolic profile and BNP level in one week.  ICD (implantable cardiac defibrillator) battery depletion  Patient is  scheduled to establish with Dr. Hillis Range, this week.  Encounter for long-term (current) use of anticoagulants Continue scheduled followup in our Coumadin clinic. Following review Dr. Antoine Poche, ASA will be discontinued.  Hyperlipidemia Patient currently not on a statin. Will assess lipid status, with further recommendations to follow.    Gene Klee Kolek, PAC   History and all data above reviewed.  Patient examined.  I agree with the findings as above.  He did well after the last visit.  We will adjust his medications as above.  I will plan an ischemia work up in the future. The patient exam reveals COR:No murmur,  Lungs: Clear  ,  Abd: Positive bowel sounds, no rebound no guarding, Ext  Mild edema  .  All available labs, radiology testing, previous records reviewed. Agree with documented assessment and plan. Meds titrated as above.  Marrio Hochrein  4:55 PM  01/08/2012

## 2012-01-08 NOTE — Patient Instructions (Addendum)
   Increase Lisinopril to 2.5mg  twice a day   Stop Aspirin   Labs:  CMET, BNP, FLP Reminder:  Nothing to eat or drink after 12 midnight prior to labs. Office will contact with results Follow up in  3 months

## 2012-01-08 NOTE — Assessment & Plan Note (Signed)
Will proceed with medication titration, this time with lisinopril to twice a day dosing regimen. We'll continue carvedilol at current dose. If blood pressure remains stable, would attempt to further uptitrate carvedilol to target 25 twice a day, if BP remains stable. Will order followup metabolic profile and BNP level in one week.

## 2012-01-10 ENCOUNTER — Encounter: Payer: Self-pay | Admitting: Internal Medicine

## 2012-01-10 ENCOUNTER — Encounter: Payer: Self-pay | Admitting: *Deleted

## 2012-01-10 ENCOUNTER — Ambulatory Visit (INDEPENDENT_AMBULATORY_CARE_PROVIDER_SITE_OTHER): Payer: Medicare Other | Admitting: *Deleted

## 2012-01-10 ENCOUNTER — Ambulatory Visit (INDEPENDENT_AMBULATORY_CARE_PROVIDER_SITE_OTHER): Payer: Medicare Other | Admitting: Internal Medicine

## 2012-01-10 VITALS — BP 126/82 | HR 62 | Ht 71.0 in | Wt 198.0 lb

## 2012-01-10 DIAGNOSIS — I10 Essential (primary) hypertension: Secondary | ICD-10-CM

## 2012-01-10 DIAGNOSIS — I255 Ischemic cardiomyopathy: Secondary | ICD-10-CM

## 2012-01-10 DIAGNOSIS — I2699 Other pulmonary embolism without acute cor pulmonale: Secondary | ICD-10-CM

## 2012-01-10 DIAGNOSIS — Z7901 Long term (current) use of anticoagulants: Secondary | ICD-10-CM

## 2012-01-10 DIAGNOSIS — R0602 Shortness of breath: Secondary | ICD-10-CM | POA: Diagnosis not present

## 2012-01-10 DIAGNOSIS — I2589 Other forms of chronic ischemic heart disease: Secondary | ICD-10-CM

## 2012-01-10 DIAGNOSIS — I5022 Chronic systolic (congestive) heart failure: Secondary | ICD-10-CM | POA: Diagnosis not present

## 2012-01-10 LAB — ICD DEVICE OBSERVATION
ATRIAL PACING ICD: 0 pct
BATTERY VOLTAGE: 2.49 V
BRDY-0002RV: 50 {beats}/min
BRDY-0004RV: 95 {beats}/min
HV IMPEDENCE: 34 Ohm
RV LEAD AMPLITUDE: 8.4 mv
RV LEAD IMPEDENCE ICD: 485 Ohm
RV LEAD THRESHOLD: 0.5 V
TOT-0008: 0
TZAT-0001SLOWVT: 1
TZAT-0019SLOWVT: 7.5 V
TZAT-0020SLOWVT: 1 ms
TZON-0004SLOWVT: 12
TZON-0005SLOWVT: 6
TZON-0008SLOWVT: 20 ms
TZST-0001SLOWVT: 2
TZST-0001SLOWVT: 4
TZST-0003SLOWVT: 30 J
TZST-0003SLOWVT: 30 J
TZST-0003SLOWVT: 30 J
VENTRICULAR PACING ICD: 0 pct

## 2012-01-10 NOTE — Assessment & Plan Note (Signed)
The patient has chronic systolic dysfunction and NYHA Class II/III CHF.  He is s/p ICD implantation in [place] in 2008.  His atrial lead is not functional due to low impedance.  Device function is otherwise normal.  He is approaching ERI battery status. We will follow with office checks frequently.  He will see Baxter Hire in 2 months and then likely monthly thereafter until he reaches ERI. We will defer conversation about any possible lead revisions until that time.

## 2012-01-10 NOTE — Assessment & Plan Note (Signed)
Stable No change required today  

## 2012-01-10 NOTE — Progress Notes (Signed)
icd check in clinic  

## 2012-01-10 NOTE — Patient Instructions (Addendum)
Your physician recommends that you schedule a follow-up appointment in: 2 months in [place] with [place]

## 2012-01-10 NOTE — Progress Notes (Signed)
Rick [name],[name], MD: Primary Cardiologist:  Dr Rick Rick [name] is a 66 y.o. male with a h/o ischemic cardiomyopathy sp ICD (SJM) in [place] 11/09/06 who presents today to establish care in the Electrophysiology device clinic.   The patient reports doing very well since having an ICD implanted and remains very active despite his age.  He is a bus Nature conservation officer.   His defibrillator atrial lead impedance is chronically low and has therefore been turned off.  He appears to have done well with this.   Recently he presented to establish care with Dr Rick Rick [name] in [place].  Today, he  denies symptoms of palpitations, chest pain, lower extremity edema, dizziness, or neurologic sequela.   He reports SOB with moderate activity.  He also reports having an episode of syncope in early June.  He states that he got up early am from sleeping and while walking to his bathroom passed out.  He denies any other episodes.  The patientis tolerating medications without difficulties and is otherwise without complaint today.   Past Medical History  Diagnosis Date  . Essential hypertension   . Myocardial infarction acute   . Ischemic cardiomyopathy     EF 20% by echo 2013  . Other pulmonary embolism and infarction     on coumadin  . Hyperlipidemia   . DOE (dyspnea on exertion)   . CAD (coronary artery disease)   . CVA (cerebral infarction)   . Anxiety   . Tricuspid regurgitation     Severe  . Pulmonary hypertension     56 mm Hg   Past Surgical History  Procedure Date  . Coronary artery bypass graft 2003    4 vessel  . Cardiac defibrillator placement 11/09/06    SJM Epic II DR ICD implanted in [place], atrial lead no longer functions due to low impedance    History   Social History  . Marital Status: Married    Spouse Name: N/A    Number of Children: 4  . Years of Education: N/A   Occupational History  . Disabled.    Social History Main Topics  . Smoking status: Former Games developer  .  Smokeless tobacco: Not on file  . Alcohol Use: No  . Drug Use: No  . Sexually Active: Not on file   Other Topics Concern  . Not on file   Social History Narrative   Lives with wife and brother in Social worker and grandson in [place].      Family History  Problem Relation Age of Onset  . Heart disease Mother     CAD  . Leukemia Father     No Known Allergies  Current Outpatient Prescriptions  Medication Sig Dispense Refill  . carvedilol (COREG) 6.25 MG tablet Take 1 tablet (6.25 mg total) by mouth 2 (two) times daily with a meal.  60 tablet  3  . FLUoxetine (PROZAC) 40 MG capsule Take 40 mg by mouth daily.      . furosemide (LASIX) 40 MG tablet Take 1.5 tablets (60 mg total) by mouth daily.  45 tablet  6  . isosorbide mononitrate (IMDUR) 30 MG 24 hr tablet Take 30 mg by mouth daily.      [name] lisinopril (PRINIVIL,ZESTRIL) 2.5 MG tablet Take 1 tablet (2.5 mg total) by mouth 2 (two) times daily.  60 tablet  6  . potassium chloride SA (K-DUR,KLOR-CON) 20 MEQ tablet Take 1 tablet (20 mEq total) by mouth daily.  30 tablet  6  .  warfarin (COUMADIN) 3 MG tablet Take 3 mg by mouth as directed. 1 daily except M-W-F take 1/2        ROS- all systems are reviewed and negative except as per HPI  Physical Exam: Filed Vitals:   01/10/12 1010  BP: 126/82  Pulse: 62  Height: 5\' 11"  (1.803 m)  Weight: 198 lb (89.812 kg)    GEN- The patient is well appearing, alert and oriented x 3 today.   Head- normocephalic, atraumatic Eyes-  Sclera clear, conjunctiva pink Ears- hearing intact Oropharynx- clear Neck- supple, no JVP Lymph- no cervical lymphadenopathy Lungs- Clear to ausculation bilaterally, normal work of breathing Chest- ICD pocket is well healed Heart- Regular rate and rhythm  GI- soft, NT, ND, + BS Extremities- no clubbing, cyanosis, or edema MS- no significant deformity or atrophy Skin- no rash or lesion Psych- euthymic mood, full affect Neuro- strength and sensation are  intact  ICD interrogation- reviewed in detail today,  See PACEART report EKG today reveals sinus rhythm with RBBB/LAHB, QRS , Qtc 558  Assessment and Plan:

## 2012-01-19 ENCOUNTER — Ambulatory Visit (INDEPENDENT_AMBULATORY_CARE_PROVIDER_SITE_OTHER): Payer: Medicare Other | Admitting: *Deleted

## 2012-01-19 ENCOUNTER — Telehealth: Payer: Self-pay | Admitting: *Deleted

## 2012-01-19 ENCOUNTER — Other Ambulatory Visit: Payer: Self-pay | Admitting: *Deleted

## 2012-01-19 DIAGNOSIS — I10 Essential (primary) hypertension: Secondary | ICD-10-CM

## 2012-01-19 DIAGNOSIS — Z7901 Long term (current) use of anticoagulants: Secondary | ICD-10-CM | POA: Diagnosis not present

## 2012-01-19 DIAGNOSIS — R0602 Shortness of breath: Secondary | ICD-10-CM

## 2012-01-19 DIAGNOSIS — I2699 Other pulmonary embolism without acute cor pulmonale: Secondary | ICD-10-CM | POA: Diagnosis not present

## 2012-01-19 DIAGNOSIS — I255 Ischemic cardiomyopathy: Secondary | ICD-10-CM

## 2012-01-19 LAB — POCT INR: INR: 3

## 2012-01-19 MED ORDER — SPIRONOLACTONE 25 MG PO TABS: 25.0000 mg | ORAL_TABLET | Freq: Every day | ORAL | Status: DC

## 2012-01-19 NOTE — Telephone Encounter (Signed)
Message copied by Eustace Moore on Fri Jan 19, 2012 12:04 PM ------      Message from: Rande Brunt      Created: Fri Jan 12, 2012 11:16 AM       Renal fxn stable. Albumin 2.9. LDL 132, but Total Bilirubin 4.9. Therefore, would not add statin at this time. Recommend U/A, to r/o proteinuria, and refer these results to primary MD. Pt may need GI eval for elevated T. Bilirubin. Regarding BNP level, trending down but still elevated at 2700. Therefore, recommend adding Aldactone 25 mg daily, D/cing supplemental K, and checking BMET/BNP in 5 days.

## 2012-01-19 NOTE — Telephone Encounter (Signed)
Patient and wife informed. Copy of results forwarded to PCP. Lab orders faxed to [facility] lab.

## 2012-01-22 DIAGNOSIS — I10 Essential (primary) hypertension: Secondary | ICD-10-CM | POA: Diagnosis not present

## 2012-01-22 DIAGNOSIS — R0602 Shortness of breath: Secondary | ICD-10-CM | POA: Diagnosis not present

## 2012-01-22 DIAGNOSIS — I2589 Other forms of chronic ischemic heart disease: Secondary | ICD-10-CM | POA: Diagnosis not present

## 2012-01-26 ENCOUNTER — Telehealth: Payer: Self-pay | Admitting: *Deleted

## 2012-01-26 NOTE — Telephone Encounter (Signed)
Spoke with wife and she states patient is compliant with medications; however, patient isn't refraining from added salt since she cooks with salt and patient drinks soft drinks as well.

## 2012-01-26 NOTE — Telephone Encounter (Signed)
Please emphasize importance of refraining from added Na, and to not drink more than 1.5 Liters/24 hr

## 2012-01-26 NOTE — Telephone Encounter (Signed)
Message copied by Eustace Moore on Fri Jan 26, 2012  3:15 PM ------      Message from: Rande Brunt      Created: Wed Jan 24, 2012  5:29 PM       NL K, renal fxn, but BNP still up at 2700. Is pt compliant with meds? Refraining from added salt? Weighing himself daily? Please confirm current diuretic regimen.

## 2012-01-30 ENCOUNTER — Other Ambulatory Visit: Payer: Self-pay | Admitting: *Deleted

## 2012-01-30 MED ORDER — WARFARIN SODIUM 3 MG PO TABS: 3.0000 mg | ORAL_TABLET | ORAL | Status: DC

## 2012-01-30 NOTE — Telephone Encounter (Signed)
Wife informed via voicemail.

## 2012-02-09 ENCOUNTER — Ambulatory Visit (INDEPENDENT_AMBULATORY_CARE_PROVIDER_SITE_OTHER): Payer: Medicare Other | Admitting: *Deleted

## 2012-02-09 DIAGNOSIS — Z7901 Long term (current) use of anticoagulants: Secondary | ICD-10-CM | POA: Diagnosis not present

## 2012-02-09 DIAGNOSIS — I2699 Other pulmonary embolism without acute cor pulmonale: Secondary | ICD-10-CM | POA: Diagnosis not present

## 2012-03-08 ENCOUNTER — Ambulatory Visit (INDEPENDENT_AMBULATORY_CARE_PROVIDER_SITE_OTHER): Payer: Medicare Other | Admitting: *Deleted

## 2012-03-08 DIAGNOSIS — Z7901 Long term (current) use of anticoagulants: Secondary | ICD-10-CM

## 2012-03-08 DIAGNOSIS — I2699 Other pulmonary embolism without acute cor pulmonale: Secondary | ICD-10-CM

## 2012-03-11 DIAGNOSIS — R29810 Facial weakness: Secondary | ICD-10-CM | POA: Diagnosis not present

## 2012-03-11 DIAGNOSIS — I2510 Atherosclerotic heart disease of native coronary artery without angina pectoris: Secondary | ICD-10-CM | POA: Diagnosis not present

## 2012-03-11 DIAGNOSIS — I517 Cardiomegaly: Secondary | ICD-10-CM | POA: Diagnosis not present

## 2012-03-11 DIAGNOSIS — I2789 Other specified pulmonary heart diseases: Secondary | ICD-10-CM | POA: Diagnosis not present

## 2012-03-11 DIAGNOSIS — I959 Hypotension, unspecified: Secondary | ICD-10-CM | POA: Diagnosis not present

## 2012-03-11 DIAGNOSIS — I428 Other cardiomyopathies: Secondary | ICD-10-CM | POA: Diagnosis not present

## 2012-03-11 DIAGNOSIS — Z951 Presence of aortocoronary bypass graft: Secondary | ICD-10-CM | POA: Diagnosis not present

## 2012-03-11 DIAGNOSIS — K7689 Other specified diseases of liver: Secondary | ICD-10-CM | POA: Diagnosis not present

## 2012-03-11 DIAGNOSIS — Z8673 Personal history of transient ischemic attack (TIA), and cerebral infarction without residual deficits: Secondary | ICD-10-CM | POA: Diagnosis not present

## 2012-03-11 DIAGNOSIS — R109 Unspecified abdominal pain: Secondary | ICD-10-CM | POA: Diagnosis not present

## 2012-03-11 DIAGNOSIS — I509 Heart failure, unspecified: Secondary | ICD-10-CM | POA: Diagnosis not present

## 2012-03-11 DIAGNOSIS — F411 Generalized anxiety disorder: Secondary | ICD-10-CM | POA: Diagnosis not present

## 2012-03-11 DIAGNOSIS — Z86711 Personal history of pulmonary embolism: Secondary | ICD-10-CM | POA: Diagnosis not present

## 2012-03-11 DIAGNOSIS — I5022 Chronic systolic (congestive) heart failure: Secondary | ICD-10-CM | POA: Diagnosis not present

## 2012-03-11 DIAGNOSIS — E876 Hypokalemia: Secondary | ICD-10-CM | POA: Diagnosis not present

## 2012-03-11 DIAGNOSIS — R4182 Altered mental status, unspecified: Secondary | ICD-10-CM | POA: Diagnosis not present

## 2012-03-11 DIAGNOSIS — Z9861 Coronary angioplasty status: Secondary | ICD-10-CM | POA: Diagnosis not present

## 2012-03-11 DIAGNOSIS — R55 Syncope and collapse: Secondary | ICD-10-CM | POA: Diagnosis not present

## 2012-03-11 DIAGNOSIS — I2589 Other forms of chronic ischemic heart disease: Secondary | ICD-10-CM | POA: Diagnosis not present

## 2012-03-11 DIAGNOSIS — F329 Major depressive disorder, single episode, unspecified: Secondary | ICD-10-CM | POA: Diagnosis not present

## 2012-03-11 DIAGNOSIS — R0602 Shortness of breath: Secondary | ICD-10-CM | POA: Diagnosis not present

## 2012-03-11 DIAGNOSIS — Z95818 Presence of other cardiac implants and grafts: Secondary | ICD-10-CM | POA: Diagnosis not present

## 2012-03-11 DIAGNOSIS — R17 Unspecified jaundice: Secondary | ICD-10-CM | POA: Diagnosis not present

## 2012-03-11 DIAGNOSIS — I10 Essential (primary) hypertension: Secondary | ICD-10-CM | POA: Diagnosis not present

## 2012-03-11 DIAGNOSIS — I079 Rheumatic tricuspid valve disease, unspecified: Secondary | ICD-10-CM | POA: Diagnosis not present

## 2012-03-11 DIAGNOSIS — Z7901 Long term (current) use of anticoagulants: Secondary | ICD-10-CM | POA: Diagnosis not present

## 2012-03-11 DIAGNOSIS — R188 Other ascites: Secondary | ICD-10-CM | POA: Diagnosis not present

## 2012-03-11 DIAGNOSIS — D696 Thrombocytopenia, unspecified: Secondary | ICD-10-CM | POA: Diagnosis not present

## 2012-03-11 DIAGNOSIS — E785 Hyperlipidemia, unspecified: Secondary | ICD-10-CM | POA: Diagnosis not present

## 2012-03-11 DIAGNOSIS — K869 Disease of pancreas, unspecified: Secondary | ICD-10-CM | POA: Diagnosis not present

## 2012-03-12 ENCOUNTER — Encounter: Payer: Self-pay | Admitting: *Deleted

## 2012-03-12 DIAGNOSIS — Z95810 Presence of automatic (implantable) cardiac defibrillator: Secondary | ICD-10-CM | POA: Insufficient documentation

## 2012-03-12 DIAGNOSIS — R188 Other ascites: Secondary | ICD-10-CM | POA: Diagnosis not present

## 2012-03-12 DIAGNOSIS — R109 Unspecified abdominal pain: Secondary | ICD-10-CM | POA: Diagnosis not present

## 2012-03-12 DIAGNOSIS — I509 Heart failure, unspecified: Secondary | ICD-10-CM | POA: Diagnosis not present

## 2012-03-12 DIAGNOSIS — R17 Unspecified jaundice: Secondary | ICD-10-CM | POA: Diagnosis not present

## 2012-03-12 DIAGNOSIS — R55 Syncope and collapse: Secondary | ICD-10-CM | POA: Diagnosis not present

## 2012-03-13 DIAGNOSIS — I509 Heart failure, unspecified: Secondary | ICD-10-CM | POA: Diagnosis not present

## 2012-03-13 DIAGNOSIS — R17 Unspecified jaundice: Secondary | ICD-10-CM | POA: Diagnosis not present

## 2012-03-13 DIAGNOSIS — R55 Syncope and collapse: Secondary | ICD-10-CM | POA: Diagnosis not present

## 2012-03-14 DIAGNOSIS — I509 Heart failure, unspecified: Secondary | ICD-10-CM | POA: Diagnosis not present

## 2012-03-14 DIAGNOSIS — R55 Syncope and collapse: Secondary | ICD-10-CM | POA: Diagnosis not present

## 2012-03-14 DIAGNOSIS — R17 Unspecified jaundice: Secondary | ICD-10-CM | POA: Diagnosis not present

## 2012-03-20 ENCOUNTER — Ambulatory Visit (INDEPENDENT_AMBULATORY_CARE_PROVIDER_SITE_OTHER): Payer: Medicare Other | Admitting: *Deleted

## 2012-03-20 ENCOUNTER — Encounter: Payer: Self-pay | Admitting: Internal Medicine

## 2012-03-20 DIAGNOSIS — I2589 Other forms of chronic ischemic heart disease: Secondary | ICD-10-CM

## 2012-03-20 DIAGNOSIS — I255 Ischemic cardiomyopathy: Secondary | ICD-10-CM

## 2012-03-20 LAB — ICD DEVICE OBSERVATION
AL AMPLITUDE: 1.6 mv
BRDY-0002RA: 50 {beats}/min
HV IMPEDENCE: 34 Ohm
RV LEAD IMPEDENCE ICD: 465 Ohm
TOT-0009: 0
TOT-0010: 51
TZAT-0001SLOWVT: 1
TZAT-0019SLOWVT: 7.5 V
TZAT-0020SLOWVT: 1 ms
TZON-0004SLOWVT: 12
TZON-0005SLOWVT: 6
TZON-0010SLOWVT: 80 ms
TZST-0001SLOWVT: 4
TZST-0003SLOWVT: 30 J
TZST-0003SLOWVT: 30 J
TZST-0003SLOWVT: 30 J

## 2012-03-20 NOTE — Progress Notes (Signed)
defib check in clinic  

## 2012-03-21 DIAGNOSIS — R0989 Other specified symptoms and signs involving the circulatory and respiratory systems: Secondary | ICD-10-CM | POA: Diagnosis not present

## 2012-03-21 DIAGNOSIS — E785 Hyperlipidemia, unspecified: Secondary | ICD-10-CM | POA: Diagnosis not present

## 2012-03-21 DIAGNOSIS — R0609 Other forms of dyspnea: Secondary | ICD-10-CM | POA: Diagnosis not present

## 2012-03-21 DIAGNOSIS — I10 Essential (primary) hypertension: Secondary | ICD-10-CM | POA: Diagnosis not present

## 2012-03-21 DIAGNOSIS — R7301 Impaired fasting glucose: Secondary | ICD-10-CM | POA: Diagnosis not present

## 2012-03-21 DIAGNOSIS — I428 Other cardiomyopathies: Secondary | ICD-10-CM | POA: Diagnosis not present

## 2012-03-21 DIAGNOSIS — I5022 Chronic systolic (congestive) heart failure: Secondary | ICD-10-CM | POA: Diagnosis not present

## 2012-04-10 ENCOUNTER — Ambulatory Visit: Payer: Medicare Other | Admitting: Physician Assistant

## 2012-04-12 ENCOUNTER — Ambulatory Visit (INDEPENDENT_AMBULATORY_CARE_PROVIDER_SITE_OTHER): Payer: Medicare Other | Admitting: *Deleted

## 2012-04-12 ENCOUNTER — Ambulatory Visit (INDEPENDENT_AMBULATORY_CARE_PROVIDER_SITE_OTHER): Payer: Medicare Other | Admitting: Physician Assistant

## 2012-04-12 ENCOUNTER — Encounter: Payer: Self-pay | Admitting: Physician Assistant

## 2012-04-12 ENCOUNTER — Other Ambulatory Visit: Payer: Self-pay | Admitting: Physician Assistant

## 2012-04-12 VITALS — BP 117/82 | HR 66 | Ht 71.0 in | Wt 206.0 lb

## 2012-04-12 DIAGNOSIS — M7989 Other specified soft tissue disorders: Secondary | ICD-10-CM | POA: Diagnosis not present

## 2012-04-12 DIAGNOSIS — R0602 Shortness of breath: Secondary | ICD-10-CM | POA: Diagnosis not present

## 2012-04-12 DIAGNOSIS — I2699 Other pulmonary embolism without acute cor pulmonale: Secondary | ICD-10-CM

## 2012-04-12 DIAGNOSIS — I5022 Chronic systolic (congestive) heart failure: Secondary | ICD-10-CM | POA: Insufficient documentation

## 2012-04-12 DIAGNOSIS — R945 Abnormal results of liver function studies: Secondary | ICD-10-CM | POA: Insufficient documentation

## 2012-04-12 DIAGNOSIS — R609 Edema, unspecified: Secondary | ICD-10-CM | POA: Insufficient documentation

## 2012-04-12 DIAGNOSIS — R7989 Other specified abnormal findings of blood chemistry: Secondary | ICD-10-CM

## 2012-04-12 DIAGNOSIS — M79609 Pain in unspecified limb: Secondary | ICD-10-CM | POA: Diagnosis not present

## 2012-04-12 DIAGNOSIS — G4733 Obstructive sleep apnea (adult) (pediatric): Secondary | ICD-10-CM

## 2012-04-12 DIAGNOSIS — E785 Hyperlipidemia, unspecified: Secondary | ICD-10-CM

## 2012-04-12 DIAGNOSIS — R17 Unspecified jaundice: Secondary | ICD-10-CM | POA: Diagnosis not present

## 2012-04-12 DIAGNOSIS — E8809 Other disorders of plasma-protein metabolism, not elsewhere classified: Secondary | ICD-10-CM | POA: Diagnosis not present

## 2012-04-12 DIAGNOSIS — Z95810 Presence of automatic (implantable) cardiac defibrillator: Secondary | ICD-10-CM

## 2012-04-12 DIAGNOSIS — Z7901 Long term (current) use of anticoagulants: Secondary | ICD-10-CM

## 2012-04-12 DIAGNOSIS — R55 Syncope and collapse: Secondary | ICD-10-CM | POA: Insufficient documentation

## 2012-04-12 DIAGNOSIS — G4730 Sleep apnea, unspecified: Secondary | ICD-10-CM

## 2012-04-12 LAB — POCT INR: INR: 5.9

## 2012-04-12 MED ORDER — FUROSEMIDE 80 MG PO TABS: 80.0000 mg | ORAL_TABLET | Freq: Every day | ORAL | Status: DC

## 2012-04-12 NOTE — Patient Instructions (Addendum)
   Referral to Dr. Karilyn Cota  Referral to Dr. Andrey Campanile  Referral to Cardiac Rehab  Left upper extremity doppler - today  Labs - today - CMET, CBC, BNP  Increase Lasix to 80mg  daily  Continue all other current medications.  Repeat labs in 1 week (around November 15) for BMET, BNP Office will contact with results Establish with primary MD - list provided for [facility] Follow up in  1 month

## 2012-04-12 NOTE — Assessment & Plan Note (Addendum)
Patient has gained 8 pounds since last OV. Will increase Lasix to 80 mg every morning. Check followup labs today and repeat BMET/BNP level in 1 week. We'll not uptitrate carvedilol or ACE inhibitor at present time, given recent LOC secondary to probable OH. Of note, I strongly recommend that he increase his baseline exercise tolerance level, and we will refer him to cardiac rehabilitation. Patient would also like to establish with a primary M.D., here in [place].

## 2012-04-12 NOTE — Assessment & Plan Note (Signed)
We'll order LUE duplex study to rule out DVT.

## 2012-04-12 NOTE — Assessment & Plan Note (Signed)
Will refer patient to Dr. Lionel December in [place], [place], for formal evaluation and further recommendations. It may be that this is all due to hepatic congestion from right heart failure/severe tricuspid regurgitation.

## 2012-04-12 NOTE — Progress Notes (Signed)
Primary Cardiologist: Rollene Rotunda, MD   HPI: Patient returns for early scheduled followup. Recommended labs at time of last OV, as follows:   - August 19: BUN 18, creatinine 1.2 (1.3), and potassium 3.6. LFTs: Albumin 2.9, elevated total bilirubin 4.9. BNP 2900 (2700)  Medication adjustments, as follows: ASA DC'd, lisinopril increased to 2.5 twice a day.  Since his last OV, patient was briefly hospitalized in [place] in early October, status post syncope. His wife reports that his ICD was interrogated, and apparently was within NL limits. Records indicate that head CT was negative, cardiac markers NL, potassium 3.1, and normal TSH. Creatinine 1.2. Carotid Dopplers were normal.  Patient reportedly appear jaundiced, and LFTs elevated with peak total bilirubin 7.0 and direct bilirubin 3.3. AST/ALT NL, Albumin 2.5. CT scan of abdomen: Mild ascites, markedly thickened gallbladder wall, but without evidence of gallstone, and reported negative sonographic Murphy's sign; echogenic liver suggesting fatty infiltration, but without focal liver lesion or bile duct dilatation; unremarkable appearing pancreas; evidence of underlying medical renal disease.  Patient has not had any post hospital blood work for studies.  Clinically, he continues to suggest NYHA class II/III heart failure symptoms. Of note, he has gained 8 pounds since last OV. He is extremely limited in his mobility but, when ambulating with assistance, denies any exertional CP. He does have significant DOE. He denies any orthopnea/PND, or worsening of chronic peripheral edema. He denies any ICD discharge, or recurrent syncope. He also reports sudden onset of LUE edema in the last one happened 2 days, with no prior history of such.  No Known Allergies  Current Outpatient Prescriptions  Medication Sig Dispense Refill  . aspirin 81 MG tablet Take 81 mg by mouth daily.      . carvedilol (COREG) 6.25 MG tablet Take 1 tablet (6.25 mg total)  by mouth 2 (two) times daily with a meal.  60 tablet  3  . FLUoxetine (PROZAC) 40 MG capsule Take 40 mg by mouth daily.      . furosemide (LASIX) 80 MG tablet Take 1 tablet (80 mg total) by mouth daily.  30 tablet  6  . isosorbide mononitrate (IMDUR) 30 MG 24 hr tablet Take 30 mg by mouth daily.      [name] lisinopril (PRINIVIL,ZESTRIL) 2.5 MG tablet Take 1 tablet (2.5 mg total) by mouth 2 (two) times daily.  60 tablet  6  . warfarin (COUMADIN) 3 MG tablet Take 1 tablet (3 mg total) by mouth as directed. 1 daily except M-W-F take 1/2  30 tablet  2  . [DISCONTINUED] furosemide (LASIX) 40 MG tablet Take 1.5 tablets (60 mg total) by mouth daily.  45 tablet  6  . spironolactone (ALDACTONE) 25 MG tablet Take 1 tablet (25 mg total) by mouth daily.  30 tablet  3    Past Medical History  Diagnosis Date  . Essential hypertension   . Myocardial infarction acute   . Ischemic cardiomyopathy     EF 20% by echo 2013  . Other pulmonary embolism and infarction     on coumadin  . Hyperlipidemia   . DOE (dyspnea on exertion)   . CAD (coronary artery disease)   . CVA (cerebral infarction)   . Anxiety   . Tricuspid regurgitation     Severe  . Pulmonary hypertension     56 mm Hg    Past Surgical History  Procedure Date  . Coronary artery bypass graft 2003    4 vessel  . Cardiac defibrillator  placement 11/09/06    SJM Epic II DR ICD implanted in [place], atrial lead no longer functions due to low impedance    History   Social History  . Marital Status: Married    Spouse Name: N/A    Number of Children: 4  . Years of Education: N/A   Occupational History  . Disabled.    Social History Main Topics  . Smoking status: Former Games developer  . Smokeless tobacco: Not on file  . Alcohol Use: No  . Drug Use: No  . Sexually Active: Not on file   Other Topics Concern  . Not on file   Social History Narrative   Lives with wife and brother in Social worker and grandson in [place].      Family History   Problem Relation Age of Onset  . Heart disease Mother     CAD  . Leukemia Father     ROS: no nausea, vomiting; no fever, chills; no melena, hematochezia; no claudication  PHYSICAL EXAM: BP 117/82  Pulse 66  Ht 5\' 11"  (1.803 m)  Wt 206 lb (93.441 kg)  BMI 28.73 kg/m2 GENERAL: 66 year-old male, sitting upright; NAD  HEENT: NCAT, PERRLA, EOMI; sclera clear; no xanthelasma  NECK: no bruits; marked JVD noted on the right  LUNGS: Diminished breath sounds, no crackles/wheezes  CARDIAC: RRR (S1, S2); 213/6 holosystolic murmur at the base; no rubs or gallops  ABDOMEN: soft, non-tender; intact BS  EXTREMETIES: 1+ bilateral peripheral edema; swollen LUE, nonpalpable left RP/BP  SKIN: warm/dry; no obvious rash/lesions  MUSCULOSKELETAL: no joint deformity  NEURO: no focal deficit; NL affect    EKG:    ASSESSMENT & PLAN:  Syncope This may have been secondary to orthostatic hypotension, according to the patient's wife's report. Interrogation of his ICD was apparently within NL limits, and he is to keep previously scheduled followup with Dr. Johney Frame here in our device clinic, in the next several weeks.  Abnormal liver function tests Will refer patient to Dr. Lionel December in [place], [place], for formal evaluation and further recommendations. It may be that this is all due to hepatic congestion from right heart failure/severe tricuspid regurgitation.  Chronic systolic heart failure Patient has gained 8 pounds since last OV. Will increase Lasix to 80 mg every morning. Check followup labs today and repeat BMET/BNP level in 1 week. We'll not uptitrate carvedilol or ACE inhibitor at present time, given recent LOC secondary to probable OH. Of note, I strongly recommend that he increase his baseline exercise tolerance level, and we will refer him to cardiac rehabilitation. Patient would also like to establish with a primary M.D., here in [place].  Edema We'll order LUE duplex study to rule out  DVT.  Sleep apnea Patient reportedly has been previously diagnosed with OSA, but refused CPAP. He is now inclined to reconsider this, and we'll refer him to Dr. Josephina Shih for reassessment and further recommendations.  Hyperlipidemia Recent followup LDL 132. Given underlying liver pathology, do not recommend treating with a statin.    Gene Leinaala Catanese, PAC

## 2012-04-12 NOTE — Assessment & Plan Note (Signed)
This may have been secondary to orthostatic hypotension, according to the patient's wife's report. Interrogation of his ICD was apparently within NL limits, and he is to keep previously scheduled followup with Dr. Johney Frame here in our device clinic, in the next several weeks.

## 2012-04-12 NOTE — Assessment & Plan Note (Signed)
Recent followup LDL 132. Given underlying liver pathology, do not recommend treating with a statin.

## 2012-04-12 NOTE — Assessment & Plan Note (Signed)
Patient reportedly has been previously diagnosed with OSA, but refused CPAP. He is now inclined to reconsider this, and we'll refer him to Dr. Josephina Shih for reassessment and further recommendations.

## 2012-04-16 ENCOUNTER — Telehealth: Payer: Self-pay | Admitting: *Deleted

## 2012-04-16 NOTE — Telephone Encounter (Signed)
Notes Recorded by Lesle Chris, LPN on 16/03/9603 at 9:32 AM Wife Lucendia Herrlich) notified of below. Notes Recorded by Lesle Chris, LPN on 54/02/8118 at 3:58 PM Left message to return call.

## 2012-04-16 NOTE — Telephone Encounter (Signed)
Message copied by Lesle Chris on Tue Apr 16, 2012  9:32 AM ------      Message from: Prescott Parma C      Created: Mon Apr 15, 2012 11:12 AM       Baseline labs reviewed. BNP much increased at 3900 (2900). Recommend further increasing current Lasix dose to 80 bid (x3 days), then back to 80 daily. Will reassess after f/u repeat labs, including BNP level, scheduled for later this week.

## 2012-04-19 ENCOUNTER — Encounter (INDEPENDENT_AMBULATORY_CARE_PROVIDER_SITE_OTHER): Payer: Self-pay | Admitting: Internal Medicine

## 2012-04-19 ENCOUNTER — Ambulatory Visit (HOSPITAL_COMMUNITY)
Admission: RE | Admit: 2012-04-19 | Discharge: 2012-04-19 | Disposition: A | Discharge status: Home / Self Care | Payer: Medicare Other | Source: Ambulatory Visit | Attending: Internal Medicine | Admitting: Internal Medicine

## 2012-04-19 ENCOUNTER — Ambulatory Visit (INDEPENDENT_AMBULATORY_CARE_PROVIDER_SITE_OTHER): Payer: Medicare Other | Admitting: Internal Medicine

## 2012-04-19 ENCOUNTER — Ambulatory Visit (INDEPENDENT_AMBULATORY_CARE_PROVIDER_SITE_OTHER): Payer: Medicare Other | Admitting: *Deleted

## 2012-04-19 VITALS — BP 90/60 | HR 66 | Temp 97.7°F | Ht 71.0 in | Wt 198.6 lb

## 2012-04-19 DIAGNOSIS — R17 Unspecified jaundice: Secondary | ICD-10-CM | POA: Insufficient documentation

## 2012-04-19 DIAGNOSIS — K7460 Unspecified cirrhosis of liver: Secondary | ICD-10-CM

## 2012-04-19 DIAGNOSIS — E8779 Other fluid overload: Secondary | ICD-10-CM

## 2012-04-19 DIAGNOSIS — I509 Heart failure, unspecified: Secondary | ICD-10-CM

## 2012-04-19 DIAGNOSIS — E8770 Fluid overload, unspecified: Secondary | ICD-10-CM

## 2012-04-19 DIAGNOSIS — I2699 Other pulmonary embolism without acute cor pulmonale: Secondary | ICD-10-CM

## 2012-04-19 DIAGNOSIS — I2510 Atherosclerotic heart disease of native coronary artery without angina pectoris: Secondary | ICD-10-CM

## 2012-04-19 DIAGNOSIS — Z7901 Long term (current) use of anticoagulants: Secondary | ICD-10-CM

## 2012-04-19 DIAGNOSIS — R748 Abnormal levels of other serum enzymes: Secondary | ICD-10-CM

## 2012-04-19 DIAGNOSIS — R0602 Shortness of breath: Secondary | ICD-10-CM | POA: Diagnosis not present

## 2012-04-19 DIAGNOSIS — R188 Other ascites: Secondary | ICD-10-CM

## 2012-04-19 LAB — COMPREHENSIVE METABOLIC PANEL
AST: 17 U/L (ref 0–37)
Alkaline Phosphatase: 67 U/L (ref 39–117)
BUN: 15 mg/dL (ref 6–23)
Creat: 1.01 mg/dL (ref 0.50–1.35)
Total Bilirubin: 5.6 mg/dL — ABNORMAL HIGH (ref 0.3–1.2)

## 2012-04-19 LAB — PROTIME-INR: INR: 2.5 — ABNORMAL HIGH (ref <1.50)

## 2012-04-19 LAB — POCT INR: INR: 2.8

## 2012-04-19 NOTE — Progress Notes (Signed)
Subjective:     Patient ID: Rick [name], male   DOB: 1946-01-27, 66 y.o.   MRN: [id number]  HPI  Referred to our office by Gaspar Skeeters MD for elevated bilirubin. Appetite is good.  NO weight loss. If he becomes SOB he will have upper abdominal pain. He has a BM about every day.  He say he has noticed the jaundice for about a month.   04/12/2012 CMet: ALP 72. AST 18. Total; bili 6.3, ALT 13, Albumin 2.6, Glucose 143, BUN 1, Creatinine 1.28 H and H 12.2, 37.5, MCV 97.3, Platelet ct Low 95  US abdomen 03/12/2012: Mild amt of ascites seen in the rt upper quadrant. Markedly thickened gallbladder wall without evidence of gallstone and by report a negative sonographic Murphy's sign. GB wall measure 8.62mm in thickness. This could be factitious wall thickening due to hypoproteinemia or other underlying medical conditions, though chronic cholecystitis cannot be excluded if suspected clinically. Echogenic liver suggesting fatty infiltration or other form of intrinsic liver disease. No focal liver lesion or bile duct dilatation is noted. Mildly dilated and ectatic pancreatic duct, but otherwise sonographically unremarkable appearing pancreas. Increased echogenicity of renal parenchyma suggesting underlying medical renal disease.  Review of Systems see hpi Current Outpatient Prescriptions  Medication Sig Dispense Refill  . aspirin 81 MG tablet Take 81 mg by mouth daily.      . carvedilol (COREG) 6.25 MG tablet Take 1 tablet (6.25 mg total) by mouth 2 (two) times daily with a meal.  60 tablet  3  . FLUoxetine (PROZAC) 40 MG capsule Take 40 mg by mouth daily.      . furosemide (LASIX) 80 MG tablet Take 1 tablet (80 mg total) by mouth daily.  30 tablet  6  . isosorbide mononitrate (IMDUR) 30 MG 24 hr tablet Take 30 mg by mouth daily.      . potassium chloride (K-DUR) 10 MEQ tablet Take 20 mEq by mouth 2 (two) times daily.      [name] spironolactone (ALDACTONE) 25 MG tablet Take 1 tablet (25 mg total) by mouth  daily.  30 tablet  3  . warfarin (COUMADIN) 3 MG tablet Take 3 mg by mouth as directed. take 1/2 daily of a 3mg       . lisinopril (PRINIVIL,ZESTRIL) 2.5 MG tablet Take 1 tablet (2.5 mg total) by mouth 2 (two) times daily.  60 tablet  6   Past Medical History  Diagnosis Date  . Essential hypertension   . Myocardial infarction acute   . Ischemic cardiomyopathy     EF 20% by echo 2013  . Other pulmonary embolism and infarction     on coumadin  . Hyperlipidemia   . DOE (dyspnea on exertion)   . CAD (coronary artery disease)   . CVA (cerebral infarction)   . Anxiety   . Tricuspid regurgitation     Severe  . Pulmonary hypertension     56 mm Hg   Past Surgical History  Procedure Date  . Coronary artery bypass graft 2003    4 vessel  . Cardiac defibrillator placement 11/09/06    SJM Epic II DR ICD implanted in [place], atrial lead no longer functions due to low impedance   No Known Allergies      Objective:   Physical Exam Filed Vitals:   04/19/12 1014  BP: 90/60  Pulse: 66  Temp: 97.7 F (36.5 C)  Height: 5\' 11"  (1.803 m)  Weight: 198 lb 9.6 oz (90.084 kg)  Alert and oriented. Skin warm and dry. Oral mucosa is moist.   . Sclera icteric, conjunctivae is pink. Thyroid not enlarged. No cervical lymphadenopathy. Lungs clear. Heart regular rate and rhythm.  Abdomen is soft. Bowel sounds are positive. No hepatomegaly. No abdominal masses felt. No tenderness.  No edema to lower extremities. Patient is alert and oriented.      Assessment:    Jaundice. Liver disease needs to be ruled out such as NAFLD. I discussed this case with Dr. Karilyn Cota.     Plan:    US abdomen, Cmet, BNP for [place] [place] in [place], AFP.

## 2012-04-19 NOTE — Patient Instructions (Addendum)
CMET, US abdomen, AFP,

## 2012-04-22 ENCOUNTER — Telehealth (INDEPENDENT_AMBULATORY_CARE_PROVIDER_SITE_OTHER): Payer: Self-pay | Admitting: Internal Medicine

## 2012-04-22 DIAGNOSIS — K7460 Unspecified cirrhosis of liver: Secondary | ICD-10-CM | POA: Diagnosis not present

## 2012-04-22 DIAGNOSIS — R17 Unspecified jaundice: Secondary | ICD-10-CM | POA: Diagnosis not present

## 2012-04-22 NOTE — Telephone Encounter (Signed)
I discussed this case with Dr. Karilyn Cota;. Increase Spironolactone to 75mg . Stop the Lasix.   Labs, SMA, ANA, Ceruplasmin, ferritin, alpha 1 antitrypsin, Hep B and C. sedrate   Rick [name] Needs OV in 1 month.

## 2012-04-23 ENCOUNTER — Other Ambulatory Visit (INDEPENDENT_AMBULATORY_CARE_PROVIDER_SITE_OTHER): Payer: Self-pay | Admitting: Internal Medicine

## 2012-04-23 DIAGNOSIS — R17 Unspecified jaundice: Secondary | ICD-10-CM | POA: Diagnosis not present

## 2012-04-23 DIAGNOSIS — K7460 Unspecified cirrhosis of liver: Secondary | ICD-10-CM | POA: Diagnosis not present

## 2012-04-23 LAB — HEPATITIS B SURFACE ANTIGEN: Hepatitis B Surface Ag: NEGATIVE

## 2012-04-24 LAB — ALPHA-1-ANTITRYPSIN: A-1 Antitrypsin, Ser: 203 mg/dL — ABNORMAL HIGH (ref 90–200)

## 2012-04-24 LAB — SEDIMENTATION RATE: Sed Rate: 13 mm/hr (ref 0–16)

## 2012-04-24 LAB — ANA: Anti Nuclear Antibody(ANA): NEGATIVE

## 2012-04-24 LAB — CERULOPLASMIN: Ceruloplasmin: 36 mg/dL (ref 20–60)

## 2012-04-25 NOTE — Telephone Encounter (Signed)
Apt has been scheduled for 05/21/12 with Dorene Ar, NP.

## 2012-04-26 ENCOUNTER — Ambulatory Visit (INDEPENDENT_AMBULATORY_CARE_PROVIDER_SITE_OTHER): Payer: Medicare Other | Admitting: *Deleted

## 2012-04-26 DIAGNOSIS — Z7901 Long term (current) use of anticoagulants: Secondary | ICD-10-CM | POA: Diagnosis not present

## 2012-04-26 DIAGNOSIS — I2699 Other pulmonary embolism without acute cor pulmonale: Secondary | ICD-10-CM | POA: Diagnosis not present

## 2012-04-26 MED ORDER — ISOSORBIDE MONONITRATE ER 30 MG PO TB24: 30.0000 mg | ORAL_TABLET | Freq: Every day | ORAL | Status: DC

## 2012-05-01 DIAGNOSIS — G4730 Sleep apnea, unspecified: Secondary | ICD-10-CM | POA: Diagnosis not present

## 2012-05-01 DIAGNOSIS — I2510 Atherosclerotic heart disease of native coronary artery without angina pectoris: Secondary | ICD-10-CM | POA: Diagnosis not present

## 2012-05-01 DIAGNOSIS — I10 Essential (primary) hypertension: Secondary | ICD-10-CM | POA: Diagnosis not present

## 2012-05-15 ENCOUNTER — Ambulatory Visit (INDEPENDENT_AMBULATORY_CARE_PROVIDER_SITE_OTHER): Payer: Medicare Other | Admitting: Physician Assistant

## 2012-05-15 ENCOUNTER — Ambulatory Visit (INDEPENDENT_AMBULATORY_CARE_PROVIDER_SITE_OTHER): Payer: Medicare Other | Admitting: *Deleted

## 2012-05-15 ENCOUNTER — Encounter: Payer: Self-pay | Admitting: Physician Assistant

## 2012-05-15 VITALS — BP 107/70 | HR 66 | Ht 71.0 in | Wt 198.8 lb

## 2012-05-15 DIAGNOSIS — Z95810 Presence of automatic (implantable) cardiac defibrillator: Secondary | ICD-10-CM | POA: Diagnosis not present

## 2012-05-15 DIAGNOSIS — Z7901 Long term (current) use of anticoagulants: Secondary | ICD-10-CM

## 2012-05-15 DIAGNOSIS — I2699 Other pulmonary embolism without acute cor pulmonale: Secondary | ICD-10-CM

## 2012-05-15 DIAGNOSIS — I5022 Chronic systolic (congestive) heart failure: Secondary | ICD-10-CM | POA: Diagnosis not present

## 2012-05-15 DIAGNOSIS — R609 Edema, unspecified: Secondary | ICD-10-CM

## 2012-05-15 DIAGNOSIS — R17 Unspecified jaundice: Secondary | ICD-10-CM

## 2012-05-15 DIAGNOSIS — I517 Cardiomegaly: Secondary | ICD-10-CM | POA: Diagnosis not present

## 2012-05-15 DIAGNOSIS — R0602 Shortness of breath: Secondary | ICD-10-CM | POA: Diagnosis not present

## 2012-05-15 NOTE — Assessment & Plan Note (Signed)
Patient remains on Coumadin anticoagulation, followed here in our clinic, but which was initiated back in January of this year in [place], [place]. I instructed the patient to confer with his primary M.D. as to the duration of warfarin therapy, given that he has now been treated with it for greater than 6 months.

## 2012-05-15 NOTE — Assessment & Plan Note (Signed)
Undergoing current evaluation by Dr. Karilyn Cota. Aldactone recently increased to 75 daily, per his recommendation. Followup as scheduled.

## 2012-05-15 NOTE — Progress Notes (Signed)
Primary Cardiologist: Rollene Rotunda, MD   HPI: Patient returns for scheduled one-month followup.  When last seen, Lasix dose increased to 80 daily. Same day labs, as follows:   - BUN 17, creatinine 1.3, potassium 3.4, and BNP 3900 (2900)  Clinically, he reports palpable improvement since last OV, with respect to both less DOE and peripheral edema. He has diuresed 8 pounds since last OV. He sleeps on 2 pillows, but denies PND. He denies ICD discharge.  No Known Allergies  Current Outpatient Prescriptions  Medication Sig Dispense Refill  . aspirin 81 MG tablet Take 81 mg by mouth daily.      . carvedilol (COREG) 6.25 MG tablet Take 1 tablet (6.25 mg total) by mouth 2 (two) times daily with a meal.  60 tablet  3  . FLUoxetine (PROZAC) 40 MG capsule Take 40 mg by mouth daily.      . furosemide (LASIX) 80 MG tablet Take 1 tablet (80 mg total) by mouth daily.  30 tablet  6  . isosorbide mononitrate (IMDUR) 30 MG 24 hr tablet Take 1 tablet (30 mg total) by mouth daily.  30 tablet  3  . lisinopril (PRINIVIL,ZESTRIL) 2.5 MG tablet Take 1 tablet (2.5 mg total) by mouth 2 (two) times daily.  60 tablet  6  . spironolactone (ALDACTONE) 25 MG tablet Take 75 mg by mouth daily.      [name] warfarin (COUMADIN) 3 MG tablet Take 3 mg by mouth as directed. take 1/2 daily of a 3mg  MANAGED BY LISA        Past Medical History  Diagnosis Date  . Essential hypertension   . Myocardial infarction acute   . Ischemic cardiomyopathy     EF 20% by echo 2013  . Other pulmonary embolism and infarction     on coumadin  . Hyperlipidemia   . DOE (dyspnea on exertion)   . CAD (coronary artery disease)   . CVA (cerebral infarction)   . Anxiety   . Tricuspid regurgitation     Severe  . Pulmonary hypertension     56 mm Hg    Past Surgical History  Procedure Date  . Coronary artery bypass graft 2003    4 vessel  . Cardiac defibrillator placement 11/09/06    SJM Epic II DR ICD implanted in [place], atrial  lead no longer functions due to low impedance    History   Social History  . Marital Status: Married    Spouse Name: N/A    Number of Children: 4  . Years of Education: N/A   Occupational History  . Disabled.    Social History Main Topics  . Smoking status: Former Smoker    Quit date: 06/06/1995  . Smokeless tobacco: Not on file  . Alcohol Use: No     Comment: Quit in 1997. Drank on the weekends  . Drug Use: No  . Sexually Active: Not on file   Other Topics Concern  . Not on file   Social History Narrative   Lives with wife and brother in Social worker and grandson in [place].     Social History Narrative   Lives with wife and brother in Social worker and grandson in [place].      Problem Relation Age of Onset  . Heart disease Mother     CAD  . Leukemia Father     ROS: no nausea, vomiting; no fever, chills; no melena, hematochezia; no claudication  PHYSICAL EXAM: BP 107/70  Pulse 66  Ht 5\' 11"  (1.803 m)  Wt 198 lb 12.8 oz (90.175 kg)  BMI 27.73 kg/m2  SpO2 99% GENERAL: 66 year-old male, sitting upright; NAD  HEENT: NCAT, PERRLA, EOMI; sclera icteric; no xanthelasma  NECK: no bruits; marked JVD noted on the right  LUNGS: Diminished breath sounds, no crackles/wheezes  CARDIAC: RRR (S1, S2); 213/6 holosystolic murmur at the base; no rubs or gallops  ABDOMEN: soft, non-tender; intact BS  EXTREMETIES: 1+ bilateral peripheral edema or SKIN: warm/dry; no obvious rash/lesions  MUSCULOSKELETAL: no joint deformity  NEURO: no focal deficit; NL affect    EKG:    ASSESSMENT & PLAN:  Chronic systolic heart failure Continue current diuretic regimen, given significant weight loss since last OV. We'll check followup labs, including repeat BNP level. Reassess clinical status in 6 months, with Dr. Antoine Poche.  Jaundice Undergoing current evaluation by Dr. Karilyn Cota. Aldactone recently increased to 75 daily, per his recommendation. Followup as  scheduled.  ICD-[employer] Followup with Dr. Johney Frame, as scheduled.  Other pulmonary embolism and infarction Patient remains on Coumadin anticoagulation, followed here in our clinic, but which was initiated back in January of this year in [place], [place]. I instructed the patient to confer with his primary M.D. as to the duration of warfarin therapy, given that he has now been treated with it for greater than 6 months. In the meanwhile, we will DC ASA, which we have previously recommended, until Coumadin therapy has been completed.    Gene Adamae Ricklefs, PAC

## 2012-05-15 NOTE — Assessment & Plan Note (Signed)
Continue current diuretic regimen, given significant weight loss since last OV. We'll check followup labs, including repeat BNP level. Reassess clinical status in 6 months, with Dr. Antoine Poche.

## 2012-05-15 NOTE — Patient Instructions (Addendum)
   Chest x-ray - today  Labs today for BMET, BNP  Office will contact with results  Stop Aspirin  Continue all other current medications. Your physician wants you to follow up in: 6 months.  You will receive a reminder letter in the mail one-two months in advance.  If you don't receive a letter, please call our office to schedule the follow up appointment

## 2012-05-15 NOTE — Assessment & Plan Note (Signed)
Followup with Dr. Allred, as scheduled 

## 2012-05-17 ENCOUNTER — Telehealth: Payer: Self-pay | Admitting: *Deleted

## 2012-05-17 ENCOUNTER — Ambulatory Visit (INDEPENDENT_AMBULATORY_CARE_PROVIDER_SITE_OTHER): Payer: Medicare Other | Admitting: *Deleted

## 2012-05-17 ENCOUNTER — Encounter: Payer: Self-pay | Admitting: Internal Medicine

## 2012-05-17 DIAGNOSIS — R0602 Shortness of breath: Secondary | ICD-10-CM

## 2012-05-17 DIAGNOSIS — I2589 Other forms of chronic ischemic heart disease: Secondary | ICD-10-CM | POA: Diagnosis not present

## 2012-05-17 DIAGNOSIS — I255 Ischemic cardiomyopathy: Secondary | ICD-10-CM

## 2012-05-17 DIAGNOSIS — I5022 Chronic systolic (congestive) heart failure: Secondary | ICD-10-CM

## 2012-05-17 LAB — ICD DEVICE OBSERVATION
AL AMPLITUDE: 1.7 mv
AL THRESHOLD: 1.25 V
ATRIAL PACING ICD: 13 pct
BAMS-0001: 180 {beats}/min
BATTERY VOLTAGE: 2.39 V
CHARGE TIME: 10.9 s
RV LEAD AMPLITUDE: 6.6 mv
RV LEAD IMPEDENCE ICD: 455 Ohm
RV LEAD THRESHOLD: 0.5 V
TZAT-0001SLOWVT: 1
TZAT-0004SLOWVT: 8
TZAT-0012SLOWVT: 200 ms
TZAT-0020SLOWVT: 1 ms
TZON-0005SLOWVT: 6
TZST-0001SLOWVT: 4
TZST-0001SLOWVT: 5
TZST-0003SLOWVT: 30 J
TZST-0003SLOWVT: 30 J
VENTRICULAR PACING ICD: 13 pct

## 2012-05-17 NOTE — Telephone Encounter (Signed)
Message copied by Eustace Moore on Fri May 17, 2012 10:29 AM ------      Message from: Rande Brunt      Created: Thu May 16, 2012  3:29 PM       K 3.1, BNP improving, but still up at 3400 (3900). Pt diuresed 8 lbs since last OV. Given persistently elevated BNP, however, I recommend increasing Lasix slightly to 120 mg qAM, and continuing PM dose at 80 daily. He also needs KDUR 40 mEq x2 doses total. He is otherwise not to be on maintenance dose KDUR, given that he is on Aldactone. Repeat BMET/BNP in 5 days.

## 2012-05-17 NOTE — Telephone Encounter (Signed)
Patient and wife informed. Lab orders given to patient and copy of medications with directions.

## 2012-05-17 NOTE — Progress Notes (Signed)
defib check in clinic  

## 2012-05-20 DIAGNOSIS — I2510 Atherosclerotic heart disease of native coronary artery without angina pectoris: Secondary | ICD-10-CM | POA: Diagnosis not present

## 2012-05-20 DIAGNOSIS — Z8673 Personal history of transient ischemic attack (TIA), and cerebral infarction without residual deficits: Secondary | ICD-10-CM | POA: Diagnosis not present

## 2012-05-20 DIAGNOSIS — Z6829 Body mass index (BMI) 29.0-29.9, adult: Secondary | ICD-10-CM | POA: Diagnosis not present

## 2012-05-20 DIAGNOSIS — I2789 Other specified pulmonary heart diseases: Secondary | ICD-10-CM | POA: Diagnosis not present

## 2012-05-20 DIAGNOSIS — Z7901 Long term (current) use of anticoagulants: Secondary | ICD-10-CM | POA: Diagnosis not present

## 2012-05-20 DIAGNOSIS — Z95810 Presence of automatic (implantable) cardiac defibrillator: Secondary | ICD-10-CM | POA: Diagnosis not present

## 2012-05-20 DIAGNOSIS — I509 Heart failure, unspecified: Secondary | ICD-10-CM | POA: Diagnosis not present

## 2012-05-20 DIAGNOSIS — Z79899 Other long term (current) drug therapy: Secondary | ICD-10-CM | POA: Diagnosis not present

## 2012-05-20 DIAGNOSIS — G4730 Sleep apnea, unspecified: Secondary | ICD-10-CM | POA: Diagnosis not present

## 2012-05-20 DIAGNOSIS — G4733 Obstructive sleep apnea (adult) (pediatric): Secondary | ICD-10-CM | POA: Diagnosis not present

## 2012-05-21 ENCOUNTER — Other Ambulatory Visit (INDEPENDENT_AMBULATORY_CARE_PROVIDER_SITE_OTHER): Payer: Self-pay | Admitting: Internal Medicine

## 2012-05-21 ENCOUNTER — Ambulatory Visit (HOSPITAL_COMMUNITY)
Admission: RE | Admit: 2012-05-21 | Discharge: 2012-05-21 | Disposition: A | Discharge status: Home / Self Care | Payer: Medicare Other | Source: Ambulatory Visit | Attending: Internal Medicine | Admitting: Internal Medicine

## 2012-05-21 ENCOUNTER — Other Ambulatory Visit (INDEPENDENT_AMBULATORY_CARE_PROVIDER_SITE_OTHER): Payer: Self-pay | Admitting: *Deleted

## 2012-05-21 ENCOUNTER — Ambulatory Visit (INDEPENDENT_AMBULATORY_CARE_PROVIDER_SITE_OTHER): Payer: Medicare Other | Admitting: Internal Medicine

## 2012-05-21 ENCOUNTER — Encounter (INDEPENDENT_AMBULATORY_CARE_PROVIDER_SITE_OTHER): Payer: Self-pay | Admitting: Internal Medicine

## 2012-05-21 ENCOUNTER — Telehealth (INDEPENDENT_AMBULATORY_CARE_PROVIDER_SITE_OTHER): Payer: Self-pay | Admitting: *Deleted

## 2012-05-21 VITALS — BP 80/60 | HR 60 | Temp 97.6°F | Ht 71.0 in | Wt 193.2 lb

## 2012-05-21 DIAGNOSIS — K7460 Unspecified cirrhosis of liver: Secondary | ICD-10-CM

## 2012-05-21 DIAGNOSIS — I517 Cardiomegaly: Secondary | ICD-10-CM | POA: Insufficient documentation

## 2012-05-21 DIAGNOSIS — K625 Hemorrhage of anus and rectum: Secondary | ICD-10-CM

## 2012-05-21 DIAGNOSIS — R109 Unspecified abdominal pain: Secondary | ICD-10-CM | POA: Insufficient documentation

## 2012-05-21 DIAGNOSIS — Z1211 Encounter for screening for malignant neoplasm of colon: Secondary | ICD-10-CM

## 2012-05-21 DIAGNOSIS — R188 Other ascites: Secondary | ICD-10-CM | POA: Insufficient documentation

## 2012-05-21 LAB — HEPATIC FUNCTION PANEL
AST: 16 U/L (ref 0–37)
Alkaline Phosphatase: 80 U/L (ref 39–117)
Bilirubin, Direct: 3.5 mg/dL — ABNORMAL HIGH (ref 0.0–0.3)
Total Bilirubin: 5.6 mg/dL — ABNORMAL HIGH (ref 0.3–1.2)

## 2012-05-21 MED ORDER — IOHEXOL 300 MG/ML  SOLN
100.0000 mL | Freq: Once | INTRAMUSCULAR | Status: AC | PRN
  Administered 2012-05-21: 100 mL via INTRAVENOUS

## 2012-05-21 MED ORDER — PEG-KCL-NACL-NASULF-NA ASC-C 100 G PO SOLR: 1.0000 | Freq: Once | ORAL | Status: DC

## 2012-05-21 NOTE — Patient Instructions (Addendum)
[facility] to [facility] and get Hepatitis A and B vaccine. Ct abdomen/pelvis with CM. Hepatic profile

## 2012-05-21 NOTE — Progress Notes (Addendum)
Subjective:     Patient ID: Rick [name], male   DOB: [birth year], 65 y.o.   MRN: [id number]  HPI Here today for f/u. Seen one month ago for elevated bilirubin.  Appetite is okay. Wife says he has lost about 8 pounds. Some SOB.  Sclera icteric.. Some abdominal distention. Stopped drinking in 1997 and this was social per patient and family He tells me he has rectal bleeding about once a week. Bright red in color. BM x 1 a day. No change in stools.Last colonoscopy ? 2007 in [place] and he thinks it was normal.  Appetite is okay.  Alpha 1 antitrypsin normal, Ceruloplasma normal, AFP 4.7, SMA normal. ANA negative. Ferritin 258 Hepatitis B and C markers were negative.     CBC 04/12/2012 H aND h 12.2 AND 37.5, Platelet ct 95    CMP     Component Value Date/Time   NA 141 04/19/2012 1030   K 3.7 04/19/2012 1030   CL 105 04/19/2012 1030   CO2 24 04/19/2012 1030   GLUCOSE 90 04/19/2012 1030   BUN 15 04/19/2012 1030   CREATININE 1.01 04/19/2012 1030   CALCIUM 8.9 04/19/2012 1030   PROT 6.4 04/19/2012 1030   ALBUMIN 3.2* 04/19/2012 1030   AST 17 04/19/2012 1030   ALT 12 04/19/2012 1030   ALKPHOS 67 04/19/2012 1030   BILITOT 5.6* 04/19/2012 1030      US abdomen 03/12/2012:  Mild amt of ascites seen in the rt upper quadrant. Markedly thickened gallbladder wall without evidence of gallstone and by report a negative sonographic Murphy's sign. GB wall measure 8.45mm in thickness. This could be factitious wall thickening due to hypoproteinemia or other underlying medical conditions, though chronic cholecystitis cannot be excluded if suspected clinically. Echogenic liver suggesting fatty infiltration or other form of intrinsic liver disease. No focal liver lesion or bile duct dilatation is noted. Mildly dilated and ectatic pancreatic duct, but otherwise sonographically unremarkable appearing pancreas. Increased echogenicity of renal parenchyma suggesting underlying medical renal  disease.  Review of Systems     Objective:   Physical Exam Filed Vitals:   05/21/12 1052  BP: 80/60  Pulse: 60  Temp: 97.6 F (36.4 C)  Height: 5\' 11"  (1.803 m)  Weight: 193 lb 3.2 oz (87.635 kg)    Alert and oriented. Skin warm and dry. Oral mucosa is moist.   . Sclera anicteric, conjunctivae is pink. Thyroid not enlarged. No cervical lymphadenopathy. Lungs clear. Heart regular rate and rhythm.  Abdomen is soft. Bowel sounds are positive. No hepatomegaly. No abdominal masses felt. No tenderness.Abdomen appears distended but is not tense. Soft.   No edema to lower extremities.Stool grossly positive for blood.     Assessment:    Rectal bleeding: colonic neoplasm needs to be ruled out. Elevated liver enzymes and ascites. I discussed this case with Dr. Karilyn Cota.    Plan:    Needs colonoscopy. CT abdomen and pelvis with CM today.  Hepatic profile today. Advised to go to the [facility] and get Hep A and B vaccine.   Needs EGD/Colonoscopy.

## 2012-05-21 NOTE — Telephone Encounter (Signed)
Patient needs movi prep 

## 2012-05-22 ENCOUNTER — Other Ambulatory Visit: Payer: Self-pay | Admitting: *Deleted

## 2012-05-22 ENCOUNTER — Encounter (INDEPENDENT_AMBULATORY_CARE_PROVIDER_SITE_OTHER): Payer: Self-pay

## 2012-05-22 ENCOUNTER — Telehealth: Payer: Self-pay | Admitting: Internal Medicine

## 2012-05-22 ENCOUNTER — Encounter: Payer: Self-pay | Admitting: *Deleted

## 2012-05-22 ENCOUNTER — Ambulatory Visit (HOSPITAL_COMMUNITY): Payer: Medicare Other

## 2012-05-22 ENCOUNTER — Ambulatory Visit (INDEPENDENT_AMBULATORY_CARE_PROVIDER_SITE_OTHER): Payer: Medicare Other | Admitting: Internal Medicine

## 2012-05-22 ENCOUNTER — Encounter: Payer: Self-pay | Admitting: Internal Medicine

## 2012-05-22 VITALS — BP 129/94 | HR 68 | Ht 71.0 in | Wt 194.0 lb

## 2012-05-22 DIAGNOSIS — I255 Ischemic cardiomyopathy: Secondary | ICD-10-CM

## 2012-05-22 DIAGNOSIS — I2589 Other forms of chronic ischemic heart disease: Secondary | ICD-10-CM | POA: Diagnosis not present

## 2012-05-22 DIAGNOSIS — R0602 Shortness of breath: Secondary | ICD-10-CM

## 2012-05-22 DIAGNOSIS — Z95810 Presence of automatic (implantable) cardiac defibrillator: Secondary | ICD-10-CM

## 2012-05-22 DIAGNOSIS — I2699 Other pulmonary embolism without acute cor pulmonale: Secondary | ICD-10-CM | POA: Diagnosis not present

## 2012-05-22 DIAGNOSIS — I5020 Unspecified systolic (congestive) heart failure: Secondary | ICD-10-CM | POA: Diagnosis not present

## 2012-05-22 DIAGNOSIS — Z01810 Encounter for preprocedural cardiovascular examination: Secondary | ICD-10-CM

## 2012-05-22 DIAGNOSIS — I5022 Chronic systolic (congestive) heart failure: Secondary | ICD-10-CM

## 2012-05-22 NOTE — Telephone Encounter (Signed)
Device change out - Monday, 12/23 @ [facility] Dr. Johney Frame Checking percert

## 2012-05-22 NOTE — Patient Instructions (Signed)
   Labs today for BMET, CBC, PT, PTT Continue all current medications. Device change out - see info sheet given Follow up will be given after procedure

## 2012-05-22 NOTE — Telephone Encounter (Signed)
No precert required.  Medicare only 

## 2012-05-22 NOTE — Progress Notes (Signed)
  PCP:  STAMBAUGH,[name], MD  The patient presents today for electrophysiology followup.  His ICD has reached ERI.  Since last being seen in our clinic, the patient reports doing very well.  Today, he denies symptoms of palpitations, chest pain, shortness of breath, orthopnea, PND, lower extremity edema, dizziness, presyncope, syncope, or neurologic sequela.  The patient feels that he is tolerating medications without difficulties and is otherwise without complaint today.   Past Medical History  Diagnosis Date  . Essential hypertension   . Myocardial infarction acute   . Ischemic cardiomyopathy     EF 20% by echo 2013  . Other pulmonary embolism and infarction     on coumadin  . Hyperlipidemia   . DOE (dyspnea on exertion)   . CAD (coronary artery disease)   . CVA (cerebral infarction)   . Anxiety   . Tricuspid regurgitation     Severe  . Pulmonary hypertension     56 mm Hg   Past Surgical History  Procedure Date  . Coronary artery bypass graft 2003    4 vessel  . Cardiac defibrillator placement 11/09/06    SJM Epic II DR ICD implanted in [place], atrial lead no longer functions due to low impedance    Current Outpatient Prescriptions  Medication Sig Dispense Refill  . carvedilol (COREG) 6.25 MG tablet Take 1 tablet (6.25 mg total) by mouth 2 (two) times daily with a meal.  60 tablet  3  . FLUoxetine (PROZAC) 40 MG capsule Take 40 mg by mouth daily.      . furosemide (LASIX) 80 MG tablet Take 120 mg by mouth every morning. & 80 mg in the evening      . isosorbide mononitrate (IMDUR) 30 MG 24 hr tablet Take 1 tablet (30 mg total) by mouth daily.  30 tablet  3  . lisinopril (PRINIVIL,ZESTRIL) 2.5 MG tablet Take 1 tablet (2.5 mg total) by mouth 2 (two) times daily.  60 tablet  6  . peg 3350 powder (MOVIPREP) 100 G SOLR Take 1 kit (100 g total) by mouth once.  1 kit  0  . spironolactone (ALDACTONE) 25 MG tablet Take 75 mg by mouth daily.      . warfarin (COUMADIN) 3 MG  tablet Take 3 mg by mouth as directed. take 1/2 daily of a 3mg MANAGED BY LISA        No Known Allergies  History   Social History  . Marital Status: Married    Spouse Name: N/A    Number of Children: 4  . Years of Education: N/A   Occupational History  . Disabled.    Social History Main Topics  . Smoking status: Former Smoker    Quit date: 06/06/1995  . Smokeless tobacco: Not on file  . Alcohol Use: No     Comment: Quit in 1997. Drank on the weekends  . Drug Use: No  . Sexually Active: Not on file   Other Topics Concern  . Not on file   Social History Narrative   Lives with wife and brother in law and grandson in [place].      Family History  Problem Relation Age of Onset  . Heart disease Mother     CAD  . Leukemia Father     ROS-  All systems are reviewed and are negative except as outlined in the HPI above   Physical Exam: Filed Vitals:   05/22/12 0909  BP: 129/94  Pulse: 68  Height:   5' 11" (1.803 m)  Weight: 194 lb (87.998 kg)  SpO2: 94%    GEN- The patient is well appearing, alert and oriented x 3 today.   Head- normocephalic, atraumatic Eyes-  Sclera clear, conjunctiva pink Ears- hearing intact Oropharynx- clear Neck- supple,  Lungs- Clear to ausculation bilaterally, normal work of breathing Chest- ICD pocket is well healed Heart- Regular rate and rhythm,   GI- soft, NT, ND, + BS Extremities- no clubbing, cyanosis, or edema MS- no significant deformity or atrophy Skin- no rash or lesion Psych- euthymic mood, full affect Neuro- strength and sensation are intact  ICD interrogation- reviewed in detail today,  See PACEART report  Assessment and Plan:   Ischemic cardiomyopathy  The patient has chronic systolic dysfunction and NYHA Class II/III CHF. He is s/p ICD implantation in [place] in 2008.  He has not had any recent arrhythmias or received ICD therapy.  His atrial lead is not functional due to low impedance and has  chronically been turned off.  Device function is otherwise normal. He has now reached ERI battery status.  Risks, benefits, and alternatives to ICD pulse generator replacement have been discussed in detail today.  At this time, he is ready to proceed.  I do not plan to replace his nonfunctional atrial lead as he does not presently have acute indication for atrial sensing or pacing.  Essential hypertension  Stable  No change required today  H/o Stroke/ PTE  Stable INRs recently He will not hold coumadin but will come to our [place] office prior to his procedure to make sure that his INR is <3. No change required today   

## 2012-05-23 ENCOUNTER — Encounter (HOSPITAL_COMMUNITY): Payer: Self-pay | Admitting: Pharmacy Technician

## 2012-05-23 DIAGNOSIS — Z23 Encounter for immunization: Secondary | ICD-10-CM | POA: Diagnosis not present

## 2012-05-26 DIAGNOSIS — I10 Essential (primary) hypertension: Secondary | ICD-10-CM | POA: Diagnosis not present

## 2012-05-26 DIAGNOSIS — Z86711 Personal history of pulmonary embolism: Secondary | ICD-10-CM | POA: Diagnosis not present

## 2012-05-26 DIAGNOSIS — I079 Rheumatic tricuspid valve disease, unspecified: Secondary | ICD-10-CM | POA: Diagnosis not present

## 2012-05-26 DIAGNOSIS — I2510 Atherosclerotic heart disease of native coronary artery without angina pectoris: Secondary | ICD-10-CM | POA: Diagnosis not present

## 2012-05-26 DIAGNOSIS — Z7901 Long term (current) use of anticoagulants: Secondary | ICD-10-CM | POA: Diagnosis not present

## 2012-05-26 DIAGNOSIS — I2589 Other forms of chronic ischemic heart disease: Secondary | ICD-10-CM | POA: Diagnosis not present

## 2012-05-26 DIAGNOSIS — I2789 Other specified pulmonary heart diseases: Secondary | ICD-10-CM | POA: Diagnosis not present

## 2012-05-26 DIAGNOSIS — I509 Heart failure, unspecified: Secondary | ICD-10-CM | POA: Diagnosis not present

## 2012-05-26 DIAGNOSIS — Z4502 Encounter for adjustment and management of automatic implantable cardiac defibrillator: Secondary | ICD-10-CM | POA: Diagnosis not present

## 2012-05-26 DIAGNOSIS — I4510 Unspecified right bundle-branch block: Secondary | ICD-10-CM | POA: Diagnosis not present

## 2012-05-26 DIAGNOSIS — Z8673 Personal history of transient ischemic attack (TIA), and cerebral infarction without residual deficits: Secondary | ICD-10-CM | POA: Diagnosis not present

## 2012-05-26 MED ORDER — SODIUM CHLORIDE 0.9 % IR SOLN
80.0000 mg | Status: AC
  Filled 2012-05-26: qty 2

## 2012-05-26 MED ORDER — CEFAZOLIN SODIUM-DEXTROSE 2-3 GM-% IV SOLR
2.0000 g | INTRAVENOUS | Status: AC
  Filled 2012-05-26: qty 50

## 2012-05-27 ENCOUNTER — Ambulatory Visit (HOSPITAL_COMMUNITY)
Admission: RE | Admit: 2012-05-27 | Discharge: 2012-05-27 | Disposition: A | Discharge status: Home / Self Care | Payer: Medicare Other | Source: Ambulatory Visit | Attending: Internal Medicine | Admitting: Internal Medicine

## 2012-05-27 ENCOUNTER — Ambulatory Visit (INDEPENDENT_AMBULATORY_CARE_PROVIDER_SITE_OTHER): Payer: Medicare Other | Admitting: *Deleted

## 2012-05-27 ENCOUNTER — Encounter (HOSPITAL_COMMUNITY)
Admission: RE | Disposition: A | Discharge status: Home / Self Care | Payer: Self-pay | Source: Ambulatory Visit | Attending: Internal Medicine

## 2012-05-27 DIAGNOSIS — Z4502 Encounter for adjustment and management of automatic implantable cardiac defibrillator: Secondary | ICD-10-CM | POA: Insufficient documentation

## 2012-05-27 DIAGNOSIS — Z8673 Personal history of transient ischemic attack (TIA), and cerebral infarction without residual deficits: Secondary | ICD-10-CM | POA: Insufficient documentation

## 2012-05-27 DIAGNOSIS — I2699 Other pulmonary embolism without acute cor pulmonale: Secondary | ICD-10-CM | POA: Diagnosis present

## 2012-05-27 DIAGNOSIS — I079 Rheumatic tricuspid valve disease, unspecified: Secondary | ICD-10-CM | POA: Insufficient documentation

## 2012-05-27 DIAGNOSIS — I4510 Unspecified right bundle-branch block: Secondary | ICD-10-CM | POA: Insufficient documentation

## 2012-05-27 DIAGNOSIS — I2589 Other forms of chronic ischemic heart disease: Secondary | ICD-10-CM | POA: Diagnosis not present

## 2012-05-27 DIAGNOSIS — Z95810 Presence of automatic (implantable) cardiac defibrillator: Secondary | ICD-10-CM | POA: Diagnosis present

## 2012-05-27 DIAGNOSIS — I255 Ischemic cardiomyopathy: Secondary | ICD-10-CM | POA: Diagnosis present

## 2012-05-27 DIAGNOSIS — I5022 Chronic systolic (congestive) heart failure: Secondary | ICD-10-CM | POA: Diagnosis present

## 2012-05-27 DIAGNOSIS — I2789 Other specified pulmonary heart diseases: Secondary | ICD-10-CM | POA: Insufficient documentation

## 2012-05-27 DIAGNOSIS — Z7901 Long term (current) use of anticoagulants: Secondary | ICD-10-CM | POA: Diagnosis not present

## 2012-05-27 DIAGNOSIS — I10 Essential (primary) hypertension: Secondary | ICD-10-CM | POA: Insufficient documentation

## 2012-05-27 DIAGNOSIS — Z86711 Personal history of pulmonary embolism: Secondary | ICD-10-CM | POA: Insufficient documentation

## 2012-05-27 DIAGNOSIS — I509 Heart failure, unspecified: Secondary | ICD-10-CM | POA: Insufficient documentation

## 2012-05-27 DIAGNOSIS — I2510 Atherosclerotic heart disease of native coronary artery without angina pectoris: Secondary | ICD-10-CM | POA: Insufficient documentation

## 2012-05-27 HISTORY — PX: IMPLANTABLE CARDIOVERTER DEFIBRILLATOR (ICD) GENERATOR CHANGE: SHX5469

## 2012-05-27 HISTORY — PX: IMPLANTABLE CARDIOVERTER DEFIBRILLATOR GENERATOR CHANGE: SHX5859

## 2012-05-27 LAB — POCT INR: INR: 2.2

## 2012-05-27 SURGERY — ICD GENERATOR CHANGE
Anesthesia: LOCAL

## 2012-05-27 MED ORDER — FENTANYL CITRATE 0.05 MG/ML IJ SOLN
INTRAMUSCULAR | Status: AC
  Filled 2012-05-27: qty 2

## 2012-05-27 MED ORDER — MIDAZOLAM HCL 5 MG/5ML IJ SOLN
INTRAMUSCULAR | Status: AC
  Filled 2012-05-27: qty 5

## 2012-05-27 MED ORDER — MUPIROCIN 2 % EX OINT
TOPICAL_OINTMENT | Freq: Two times a day (BID) | CUTANEOUS | Status: DC
  Administered 2012-05-27: 1 via NASAL
  Filled 2012-05-27: qty 22

## 2012-05-27 MED ORDER — SODIUM CHLORIDE 0.45 % IV SOLN
INTRAVENOUS | Status: DC
  Administered 2012-05-27: 13:00:00 via INTRAVENOUS

## 2012-05-27 MED ORDER — CHLORHEXIDINE GLUCONATE 4 % EX LIQD
60.0000 mL | Freq: Once | CUTANEOUS | Status: DC
  Filled 2012-05-27: qty 60

## 2012-05-27 MED ORDER — LIDOCAINE HCL (PF) 1 % IJ SOLN
INTRAMUSCULAR | Status: AC
  Filled 2012-05-27: qty 60

## 2012-05-27 NOTE — H&P (View-Only) (Signed)
PCP:  STAMBAUGH,[name], MD  The patient presents today for electrophysiology followup.  His ICD has reached ERI.  Since last being seen in our clinic, the patient reports doing very well.  Today, he denies symptoms of palpitations, chest pain, shortness of breath, orthopnea, PND, lower extremity edema, dizziness, presyncope, syncope, or neurologic sequela.  The patient feels that he is tolerating medications without difficulties and is otherwise without complaint today.   Past Medical History  Diagnosis Date  . Essential hypertension   . Myocardial infarction acute   . Ischemic cardiomyopathy     EF 20% by echo 2013  . Other pulmonary embolism and infarction     on coumadin  . Hyperlipidemia   . DOE (dyspnea on exertion)   . CAD (coronary artery disease)   . CVA (cerebral infarction)   . Anxiety   . Tricuspid regurgitation     Severe  . Pulmonary hypertension     56 mm Hg   Past Surgical History  Procedure Date  . Coronary artery bypass graft 2003    4 vessel  . Cardiac defibrillator placement 11/09/06    SJM Epic II DR ICD implanted in [place], atrial lead no longer functions due to low impedance    Current Outpatient Prescriptions  Medication Sig Dispense Refill  . carvedilol (COREG) 6.25 MG tablet Take 1 tablet (6.25 mg total) by mouth 2 (two) times daily with a meal.  60 tablet  3  . FLUoxetine (PROZAC) 40 MG capsule Take 40 mg by mouth daily.      . furosemide (LASIX) 80 MG tablet Take 120 mg by mouth every morning. & 80 mg in the evening      . isosorbide mononitrate (IMDUR) 30 MG 24 hr tablet Take 1 tablet (30 mg total) by mouth daily.  30 tablet  3  . lisinopril (PRINIVIL,ZESTRIL) 2.5 MG tablet Take 1 tablet (2.5 mg total) by mouth 2 (two) times daily.  60 tablet  6  . peg 3350 powder (MOVIPREP) 100 G SOLR Take 1 kit (100 g total) by mouth once.  1 kit  0  . spironolactone (ALDACTONE) 25 MG tablet Take 75 mg by mouth daily.      [name] warfarin (COUMADIN) 3 MG  tablet Take 3 mg by mouth as directed. take 1/2 daily of a 3mg  MANAGED BY LISA        No Known Allergies  History   Social History  . Marital Status: Married    Spouse Name: N/A    Number of Children: 4  . Years of Education: N/A   Occupational History  . Disabled.    Social History Main Topics  . Smoking status: Former Smoker    Quit date: 06/06/1995  . Smokeless tobacco: Not on file  . Alcohol Use: No     Comment: Quit in 1997. Drank on the weekends  . Drug Use: No  . Sexually Active: Not on file   Other Topics Concern  . Not on file   Social History Narrative   Lives with wife and brother in [place] and grandson in [place].      Family History  Problem Relation Age of Onset  . Heart disease Mother     CAD  . Leukemia Father     ROS-  All systems are reviewed and are negative except as outlined in the HPI above   Physical Exam: Filed Vitals:   05/22/12 0909  BP: 129/94  Pulse: 68  Height:  5\' 11"  (1.803 m)  Weight: 194 lb (87.998 kg)  SpO2: 94%    GEN- The patient is well appearing, alert and oriented x 3 today.   Head- normocephalic, atraumatic Eyes-  Sclera clear, conjunctiva pink Ears- hearing intact Oropharynx- clear Neck- supple,  Lungs- Clear to ausculation bilaterally, normal work of breathing Chest- ICD pocket is well healed Heart- Regular rate and rhythm,   GI- soft, NT, ND, + BS Extremities- no clubbing, cyanosis, or edema MS- no significant deformity or atrophy Skin- no rash or lesion Psych- euthymic mood, full affect Neuro- strength and sensation are intact  ICD interrogation- reviewed in detail today,  See PACEART report  Assessment and Plan:   Ischemic cardiomyopathy  The patient has chronic systolic dysfunction and NYHA Class II/III CHF. He is s/p ICD implantation in [place] in 2008.  He has not had any recent arrhythmias or received ICD therapy.  His atrial lead is not functional due to low impedance and has  chronically been turned off.  Device function is otherwise normal. He has now reached ERI battery status.  Risks, benefits, and alternatives to ICD pulse generator replacement have been discussed in detail today.  At this time, he is ready to proceed.  I do not plan to replace his nonfunctional atrial lead as he does not presently have acute indication for atrial sensing or pacing.  Essential hypertension  Stable  No change required today  H/o Stroke/ PTE  Stable INRs recently He will not hold coumadin but will come to our [place] office prior to his procedure to make sure that his INR is <3. No change required today

## 2012-05-27 NOTE — Interval H&P Note (Signed)
History and Physical Interval Note:  05/27/2012 1:40 PM  Rick [name]  has presented today for surgery, with the diagnosis of eri  The various methods of treatment have been discussed with the patient and family. After consideration of risks, benefits and other options for treatment, the patient has consented to  Procedure(s) (LRB) with comments: ICD GENERATOR CHANGE (N/A) as a surgical intervention .  The patient's history has been reviewed, patient examined, no change in status, stable for surgery.  I have reviewed the patient's chart and labs.  Questions were answered to the patient's satisfaction.     Hillis Range

## 2012-05-27 NOTE — Op Note (Signed)
SURGEON:  Rick Range, MD      PREPROCEDURE DIAGNOSES:   1. Ischemic cardiomyopathy.   2. New York Heart Association class III, heart failure chronically.   3. CAD     POSTPROCEDURE DIAGNOSES:   1. Ischemic cardiomyopathy.   2. New York Heart Association class III heart failure chronically.   3. CAD   PROCEDURES:    1. ICD lead evaluation under fluoroscopy.  2. ICD pulse generator replacement with DFT testing  3. Skin pocket revision     INTRODUCTION:  Rick [name] is a 66 y.o. male with an ischemic CM,  NYHA Class III CHF, AND CAD who presents today for ICD pulse generator replacement for ERI battery status.  She has a SJM 7040 Riata lead.  He has never received appropriate ICD therapy.  His atrial lead impedance has been chronically <200 and therefore his atrial lead was previously programmed off, without significant V pacing. The patient therefore presents today for lead fluoroscopy with possible revision and ICD pulse generator replacement.      DESCRIPTION OF PROCEDURE:  Informed written consent was obtained and the patient was brought to the electrophysiology lab in the fasting state.  Her previously implanted SJM 7040 Riata lead was thoroughly evaluated with Cine (5 minutes required).  There was no evidence for wire extrusion from the lead.  The device was interrogated and confirmed to be at [facility] battery status.  The Riata lead measurements were all satisfactory and stable.  I therefore elected to not replace the lead today.  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  Given his advanced age, severe TR, and liver disease,  His requests to have only a generator replacement at this time is felt to be reasonable.  The patient required no sedation for the procedure today.  The patient's left chest was prepped and draped in the usual sterile fashion by the EP lab staff.  The skin overlying the left  deltopectoral region was infiltrated with lidocaine for local analgesia.  A 5-cm incision was made over the existing ICD pocket.  Electrocautery was used to assure hemostasis.  The device was exposed and removed from the pocket.  The device was disconnected from the leads.  The leads were examined thoroughly and their integrity confirmed to be intact.   The right atrial lead was confirmed to be a [employer]ery [id number]  (serial # [id number]) lead implanted 11/09/2006.  The right ventricular lead was confirmed to be a [employer], model [id number] (serial number [id number]) right ventricular defibrillator lead also implanted 11/09/2006.  Atrial lead P-waves measured 1.5 mV with an impedance of <200 ohms and a threshold of 1.25 volts at 0.5 milliseconds.  The right ventricular lead R-wave measured 6 mV with impedance of 470 ohms and a threshold of 0.7 volts at 0.5 milliseconds.   Due to atrial lead impedance suggesting insulation defect, this lead was capped off and returned to the pocket. The ventricular lead was then connected to a [facility] Assura model [phone] ICD. The pocket was revised to accomodate this new device.  The pocket was  irrigated with copious gentamicin solution.  The defibrillator was placed into the pocket.  The pocket was then closed in 2 layers with 2.0 Vicryl suture  for the subcutaneous and subcuticular layers.  Steri-Strips and a  sterile dressing were then applied.   Defibrillation Threshold testing was not  performed today.  There were no early apparent complications.     CONCLUSIONS:   1. Ischemic cardiomyopathy with chronic New York Heart Association class III heart failure, CAD   2. ICD at elective replacement indicator  3. Successful ICD pulse generator replacement   4. No early apparent complications.   Rick Joya Willmott,MD 05/27/12

## 2012-06-06 ENCOUNTER — Ambulatory Visit: Payer: Medicare Other

## 2012-06-06 ENCOUNTER — Encounter (HOSPITAL_COMMUNITY)
Admission: RE | Disposition: A | Discharge status: Home / Self Care | Payer: Self-pay | Source: Ambulatory Visit | Attending: Internal Medicine

## 2012-06-06 ENCOUNTER — Ambulatory Visit (HOSPITAL_COMMUNITY)
Admission: RE | Admit: 2012-06-06 | Discharge: 2012-06-06 | Disposition: A | Discharge status: Home / Self Care | Payer: Medicare Other | Source: Ambulatory Visit | Attending: Internal Medicine | Admitting: Internal Medicine

## 2012-06-06 ENCOUNTER — Encounter (HOSPITAL_COMMUNITY): Payer: Self-pay | Admitting: *Deleted

## 2012-06-06 DIAGNOSIS — K644 Residual hemorrhoidal skin tags: Secondary | ICD-10-CM | POA: Insufficient documentation

## 2012-06-06 DIAGNOSIS — Z7901 Long term (current) use of anticoagulants: Secondary | ICD-10-CM | POA: Insufficient documentation

## 2012-06-06 DIAGNOSIS — K625 Hemorrhage of anus and rectum: Secondary | ICD-10-CM | POA: Diagnosis not present

## 2012-06-06 DIAGNOSIS — K319 Disease of stomach and duodenum, unspecified: Secondary | ICD-10-CM | POA: Diagnosis not present

## 2012-06-06 DIAGNOSIS — I10 Essential (primary) hypertension: Secondary | ICD-10-CM | POA: Diagnosis not present

## 2012-06-06 DIAGNOSIS — D1779 Benign lipomatous neoplasm of other sites: Secondary | ICD-10-CM | POA: Insufficient documentation

## 2012-06-06 HISTORY — PX: COLONOSCOPY WITH ESOPHAGOGASTRODUODENOSCOPY (EGD): SHX5779

## 2012-06-06 SURGERY — COLONOSCOPY WITH ESOPHAGOGASTRODUODENOSCOPY (EGD)
Anesthesia: Moderate Sedation

## 2012-06-06 MED ORDER — MIDAZOLAM HCL 5 MG/5ML IJ SOLN
INTRAMUSCULAR | Status: AC
  Filled 2012-06-06: qty 10

## 2012-06-06 MED ORDER — MEPERIDINE HCL 100 MG/ML IJ SOLN
INTRAMUSCULAR | Status: AC
  Filled 2012-06-06: qty 1

## 2012-06-06 MED ORDER — MEPERIDINE HCL 50 MG/ML IJ SOLN
INTRAMUSCULAR | Status: DC | PRN
  Administered 2012-06-06: 20 mg via INTRAVENOUS

## 2012-06-06 MED ORDER — SODIUM CHLORIDE 0.45 % IV SOLN
INTRAVENOUS | Status: DC
  Administered 2012-06-06: 1000 mL via INTRAVENOUS

## 2012-06-06 MED ORDER — MIDAZOLAM HCL 5 MG/5ML IJ SOLN
INTRAMUSCULAR | Status: DC | PRN
  Administered 2012-06-06: 1 mg via INTRAVENOUS
  Administered 2012-06-06: 2 mg via INTRAVENOUS

## 2012-06-06 MED ORDER — BUTAMBEN-TETRACAINE-BENZOCAINE 2-2-14 % EX AERO
INHALATION_SPRAY | CUTANEOUS | Status: DC | PRN
  Administered 2012-06-06: 2 via TOPICAL

## 2012-06-06 NOTE — H&P (Addendum)
Rick [name] is an 67 y.o. male.   Chief Complaint: Patient is here for EGD and colonoscopy. HPI: Patient is 65 old [place] American male with multiple medical problems including ischemic cardiomyopathy, placement of cardiac defibrillator initially in 2008 and redo on 05/27/2012 who was recently evaluated for jaundice and rectal bleeding. He also has very low serum albumin. Biochemical studies for liver disease all negative. CT of the abdomen does not show changes of advanced cirrhosis but he does have prominent caudate and left hepatic lobes He is undergoing EGD looking for esophageal varices and diagnostic colonoscopy. He denies nausea vomiting abdominal pain or melena. While  he has not had frank rectal bleeding he has noted intermittent hematochezia on straining. Patient has not had any alcohol since 2007 and used to drink socially and on weekends prior to that. Family history is negative for colorectal carcinoma.  Past Medical History  Diagnosis Date  . Essential hypertension   . Myocardial infarction acute   . Ischemic cardiomyopathy     EF 20% by echo 2013  . Other pulmonary embolism and infarction     on coumadin  . Hyperlipidemia   . DOE (dyspnea on exertion)   . CAD (coronary artery disease)   . CVA (cerebral infarction)   . Anxiety   . Tricuspid regurgitation     Severe  . Pulmonary hypertension     56 mm Hg    Past Surgical History  Procedure Date  . Coronary artery bypass graft 2003    4 vessel  . Cardiac defibrillator placement 11/09/06    SJM Epic II DR ICD implanted in [place], atrial lead no longer functions due to low impedance  . [employer] assura icd 05/27/2012    Model # [id number]    Family History  Problem Relation Age of Onset  . Heart disease Mother     CAD  . Leukemia Father    Social History:  reports that he quit smoking about 17 years ago. He does not have any smokeless tobacco history on file. He reports that he does not drink alcohol  or use illicit drugs.  Allergies: No Known Allergies  Medications Prior to Admission  Medication Sig Dispense Refill  . carvedilol (COREG) 6.25 MG tablet Take 1 tablet (6.25 mg total) by mouth 2 (two) times daily with a meal.  60 tablet  3  . FLUoxetine (PROZAC) 40 MG capsule Take 40 mg by mouth daily.      . furosemide (LASIX) 80 MG tablet Take 80-120 mg by mouth 2 (two) times daily. & 80 mg in the evening      . isosorbide mononitrate (IMDUR) 30 MG 24 hr tablet Take 1 tablet (30 mg total) by mouth daily.  30 tablet  3  . lisinopril (PRINIVIL,ZESTRIL) 2.5 MG tablet Take 1 tablet (2.5 mg total) by mouth 2 (two) times daily.  60 tablet  6  . peg 3350 powder (MOVIPREP) 100 G SOLR Take 1 kit (100 g total) by mouth once.  1 kit  0  . spironolactone (ALDACTONE) 25 MG tablet Take 75 mg by mouth daily.      [name] warfarin (COUMADIN) 3 MG tablet Take 3 mg by mouth as directed. take 1/2 daily of a 3mg  MANAGED BY LISA        No results found for this or any previous visit (from the past 48 hour(s)). No results found.  ROS  Blood pressure 112/75, pulse 65, temperature 98 F (36.7 C), temperature  source Oral, resp. rate 20, height 5\' 11"  (1.803 m), weight 189 lb (85.73 kg), SpO2 99.00%. Physical Exam  Constitutional: He appears well-developed and well-nourished.  HENT:  Mouth/Throat: Oropharynx is clear and moist.  Eyes: Scleral icterus is present.  Neck: No thyromegaly present.  Cardiovascular: Normal rate, regular rhythm and normal heart sounds.   Murmur: grade 3/6 holosystolic murmur at apex. Respiratory: Effort normal and breath sounds normal.  GI: Soft. He exhibits no distension and no mass. There is no tenderness.  Musculoskeletal: Edema: trace edema around ankles.  Lymphadenopathy:    He has no cervical adenopathy.  Neurological: He is alert.  Skin: Skin is warm and dry.     Assessment/Plan Jaundice possibly due to congestive hepatopathy but cannot rule out cirrhosis. Rectal  bleeding. EGD and colonoscopy.  Jordell Outten U 06/06/2012, 4:13 PM

## 2012-06-06 NOTE — Op Note (Signed)
EGD PROCEDURE REPORT  PATIENT:  Rick [name]  MR#:  [id number] Birthdate:  1946/04/11, 67 y.o., male Endoscopist:  Dr. Malissa Hippo, MD Referred By:  Dr. Gaspar [name], M.D. Procedure Date: 06/06/2012  Procedure:   EGD & Colonoscopy  Indications:  Patient is 67 year old African male with multiple medical problems including ischemic cardiomyopathy who was been jaundiced for the last few months. He also has low serum albumin her transaminases have been normal. Abdominopelvic CT shows scant amount of ascites gallbladder wall thickening as well as prominent caudate and left hepatic lobes. He is therefore undergoing EGD looking for varices followed by diagnostic colonoscopy because of rectal bleeding. Patient underwent ICD replacement on 05/27/2012 without any problems.           Informed Consent:  The risks, benefits, alternatives & imponderables which include, but are not limited to, bleeding, infection, perforation, drug reaction and potential missed lesion have been reviewed.  The potential for biopsy, lesion removal, esophageal dilation, etc. have also been discussed.  Questions have been answered.  All parties agreeable.  Please see history & physical in medical record for more information.  Medications:  Demerol 20 mg IV Versed 3 mg IV Cetacaine spray topically for oropharyngeal anesthesia  EGD  Description of procedure:  The endoscope was introduced through the mouth and advanced to the second portion of the duodenum without difficulty or limitations. The mucosal surfaces were surveyed very carefully during advancement of the scope and upon withdrawal.  Findings:  Esophagus:  Mucosa of the esophagus was normal. Wavy GE junction but no erosions or ring noted. GEJ:  41 cm Stomach:  Stomach was empty and distended very well with insufflation. Folds in the proximal revealed mosaic pattern or snake skinning as well as patchy erythema. Antral mucosa was normal. Pyloric channel was patent.  Angularis fundus and cardia were examined by retroflexing the scope and no abnormality noted. Duodenum:  Normal bulbar and post bulbar mucosa.  Therapeutic/Diagnostic Maneuvers Performed:  None  COLONOSCOPY Description of procedure:  After a digital rectal exam was performed, that colonoscope was advanced from the anus through the rectum and colon to the area of the cecum, ileocecal valve and appendiceal orifice. The cecum was deeply intubated. These structures were well-seen and photographed for the record. From the level of the cecum and ileocecal valve, the scope was slowly and cautiously withdrawn. The mucosal surfaces were carefully surveyed utilizing scope tip to flexion to facilitate fold flattening as needed. The scope was pulled down into the rectum where a thorough exam including retroflexion was performed.  Findings:   Prep excellent. Normal mucosa of the area segments of the colon. Small submucosal lesion noted in rectum. Appearance is consistent with lipoma. Small hemorrhoids below the dentate line.  Therapeutic/Diagnostic Maneuvers Performed:  None  Complications:  None  Cecal Withdrawal Time:  7 minutes  Impression:  Mild portal gastropathy but no evidence of esophageal or gastric varices. Normal colonoscopy except external hemorrhoids and small rectal lipoma. Suspect rectal bleeding secondary to hemorrhoids in the setting of anticoagulation. Cholestasis most likely secondary to congestive hepatopathy. He could also have cardiac cirrhosis but liver into her does not suggest advanced cirrhosis.  Recommendations:  Patient will resume warfarin at usual dose and get INR checked in 7-10 days. Office visit in 8 weeks at which time we'll repeat LFTs.  RickNAJEEB [name]  06/06/2012 5:02 PM  CC: Dr. Gaspar Skeeters, MD & Dr. Bonnetta [name] ref. provider found  Rick Parma, PA

## 2012-06-10 ENCOUNTER — Encounter (HOSPITAL_COMMUNITY): Payer: Self-pay | Admitting: Internal Medicine

## 2012-06-10 ENCOUNTER — Encounter: Payer: Self-pay | Admitting: Internal Medicine

## 2012-06-10 ENCOUNTER — Ambulatory Visit (INDEPENDENT_AMBULATORY_CARE_PROVIDER_SITE_OTHER): Payer: Medicare Other | Admitting: *Deleted

## 2012-06-10 DIAGNOSIS — I255 Ischemic cardiomyopathy: Secondary | ICD-10-CM

## 2012-06-10 DIAGNOSIS — I2589 Other forms of chronic ischemic heart disease: Secondary | ICD-10-CM

## 2012-06-10 DIAGNOSIS — Z95810 Presence of automatic (implantable) cardiac defibrillator: Secondary | ICD-10-CM

## 2012-06-10 LAB — ICD DEVICE OBSERVATION
DEVICE MODEL ICD: 705822
HV IMPEDENCE: 31 Ohm
RV LEAD AMPLITUDE: 11.7 mv
RV LEAD IMPEDENCE ICD: 450 Ohm
RV LEAD THRESHOLD: 0.75 V
VENTRICULAR PACING ICD: 1 pct

## 2012-06-10 NOTE — Progress Notes (Signed)
Pt seen in clinic for wound check of ICD.  No complaints of chest pain, shortness of breath, dizziness, palpitations, or shocks. Wound well healed.  Pt very thin and so device is prominent on chest.   Device functioning normally at this time.  For full details, see PaceArt report.  No programming changes made today.  Plan to follow up in 3 months with Dr Johney Frame in [place].   Gypsy Balsam, RN, BSN 06/10/2012 2:41 PM

## 2012-06-11 ENCOUNTER — Encounter: Payer: Self-pay | Admitting: *Deleted

## 2012-06-12 DIAGNOSIS — G4730 Sleep apnea, unspecified: Secondary | ICD-10-CM | POA: Diagnosis not present

## 2012-06-12 DIAGNOSIS — Z7901 Long term (current) use of anticoagulants: Secondary | ICD-10-CM | POA: Diagnosis not present

## 2012-06-12 DIAGNOSIS — Z95810 Presence of automatic (implantable) cardiac defibrillator: Secondary | ICD-10-CM | POA: Diagnosis not present

## 2012-06-12 DIAGNOSIS — G4733 Obstructive sleep apnea (adult) (pediatric): Secondary | ICD-10-CM | POA: Diagnosis not present

## 2012-06-12 DIAGNOSIS — G4761 Periodic limb movement disorder: Secondary | ICD-10-CM | POA: Diagnosis not present

## 2012-06-12 DIAGNOSIS — Z79899 Other long term (current) drug therapy: Secondary | ICD-10-CM | POA: Diagnosis not present

## 2012-06-12 DIAGNOSIS — Z6829 Body mass index (BMI) 29.0-29.9, adult: Secondary | ICD-10-CM | POA: Diagnosis not present

## 2012-06-12 DIAGNOSIS — Z7982 Long term (current) use of aspirin: Secondary | ICD-10-CM | POA: Diagnosis not present

## 2012-06-25 ENCOUNTER — Telehealth: Payer: Self-pay | Admitting: Internal Medicine

## 2012-06-25 NOTE — Telephone Encounter (Signed)
Mr. Rick [name] is feeling weak states that he is having a little pain around his heart. No Shortness of breath. Rick [name] thinks his ICD shocked him this am. States that he thinks he is losing weight.

## 2012-06-25 NOTE — Telephone Encounter (Signed)
Pt scheduled for device check in [facility] office on 06-26-12.

## 2012-06-26 ENCOUNTER — Ambulatory Visit (INDEPENDENT_AMBULATORY_CARE_PROVIDER_SITE_OTHER): Payer: Medicare Other | Admitting: *Deleted

## 2012-06-26 DIAGNOSIS — I5022 Chronic systolic (congestive) heart failure: Secondary | ICD-10-CM

## 2012-06-26 DIAGNOSIS — I2589 Other forms of chronic ischemic heart disease: Secondary | ICD-10-CM

## 2012-06-26 DIAGNOSIS — I255 Ischemic cardiomyopathy: Secondary | ICD-10-CM

## 2012-06-26 LAB — ICD DEVICE OBSERVATION
CHARGE TIME: 8.3 s
DEV-0020ICD: NEGATIVE
RV LEAD IMPEDENCE ICD: 450 Ohm
TZON-0003FASTVT: 337 ms

## 2012-06-26 NOTE — Progress Notes (Signed)
ICD check with [employer] 

## 2012-07-01 ENCOUNTER — Ambulatory Visit (INDEPENDENT_AMBULATORY_CARE_PROVIDER_SITE_OTHER): Payer: Medicare Other | Admitting: Physician Assistant

## 2012-07-01 ENCOUNTER — Encounter: Payer: Self-pay | Admitting: Physician Assistant

## 2012-07-01 VITALS — BP 119/82 | HR 79 | Ht 71.0 in | Wt 173.0 lb

## 2012-07-01 DIAGNOSIS — R55 Syncope and collapse: Secondary | ICD-10-CM

## 2012-07-01 DIAGNOSIS — R072 Precordial pain: Secondary | ICD-10-CM

## 2012-07-01 DIAGNOSIS — Z95810 Presence of automatic (implantable) cardiac defibrillator: Secondary | ICD-10-CM

## 2012-07-01 DIAGNOSIS — Z79899 Other long term (current) drug therapy: Secondary | ICD-10-CM | POA: Diagnosis not present

## 2012-07-01 DIAGNOSIS — R079 Chest pain, unspecified: Secondary | ICD-10-CM | POA: Diagnosis not present

## 2012-07-01 DIAGNOSIS — R0602 Shortness of breath: Secondary | ICD-10-CM | POA: Diagnosis not present

## 2012-07-01 DIAGNOSIS — I5022 Chronic systolic (congestive) heart failure: Secondary | ICD-10-CM

## 2012-07-01 NOTE — Progress Notes (Signed)
Primary Cardiologist: Rollene Rotunda, MD   HPI: Patient seen as an add-on for evaluation of chest pain. He is status post recent ICD generator replacement, by Dr. Johney Frame, secondary to ERI.  He presented to our [place] clinic 5 days ago, complaining of possible ICD shock. This was interrogated and revealed no episodes. It was felt that he was possibly experiencing CP, and is referred today for further evaluation.  Patient remains a difficult historian; he suggests awakening to a sensation in his chest, the morning of the aforementioned ICD interrogation. He has not had any recurrent episodes, and denies any exertional CP. He also suggests reproduction of his discomfort, when turning or lying on his right side.  No Known Allergies  Current Outpatient Prescriptions  Medication Sig Dispense Refill  . carvedilol (COREG) 6.25 MG tablet Take 1 tablet (6.25 mg total) by mouth 2 (two) times daily with a meal.  60 tablet  3  . FLUoxetine (PROZAC) 40 MG capsule Take 40 mg by mouth daily.      . furosemide (LASIX) 80 MG tablet Take 80-120 mg by mouth 2 (two) times daily. & 80 mg in the evening      . isosorbide mononitrate (IMDUR) 30 MG 24 hr tablet Take 1 tablet (30 mg total) by mouth daily.  30 tablet  3  . lisinopril (PRINIVIL,ZESTRIL) 2.5 MG tablet Take 1 tablet (2.5 mg total) by mouth 2 (two) times daily.  60 tablet  6  . spironolactone (ALDACTONE) 25 MG tablet Take 75 mg by mouth daily.      [name] warfarin (COUMADIN) 3 MG tablet Take 3 mg by mouth as directed. take 1/2 daily of a 3mg  MANAGED BY LISA        Past Medical History  Diagnosis Date  . Essential hypertension   . Myocardial infarction acute   . Ischemic cardiomyopathy     EF 20% by echo 2013  . Other pulmonary embolism and infarction     on coumadin  . Hyperlipidemia   . DOE (dyspnea on exertion)   . CAD (coronary artery disease)   . CVA (cerebral infarction)   . Anxiety   . Tricuspid regurgitation     Severe  . Pulmonary  hypertension     56 mm Hg    Past Surgical History  Procedure Date  . Coronary artery bypass graft 2003    4 vessel  . Cardiac defibrillator placement 11/09/06    SJM Epic II DR ICD implanted in [place], atrial lead no longer functions due to low impedance  . St. jude fortify assura icd 05/27/2012    Model # [id number]  . Colonoscopy with esophagogastroduodenoscopy (egd) 06/06/2012    Procedure: COLONOSCOPY WITH ESOPHAGOGASTRODUODENOSCOPY (EGD);  Surgeon: Malissa Hippo, MD;  Location: [facility];  Service: Endoscopy;  Laterality: N/A;  310    History   Social History  . Marital Status: Married    Spouse Name: N/A    Number of Children: 4  . Years of Education: N/A   Occupational History  . Disabled.    Social History Main Topics  . Smoking status: Former Smoker    Quit date: 06/06/1995  . Smokeless tobacco: Not on file  . Alcohol Use: No     Comment: Quit in 1997. Drank on the weekends  . Drug Use: No  . Sexually Active: Not on file   Other Topics Concern  . Not on file   Social History Narrative   Lives with wife and brother  in law and grandson in [place].     Social History Narrative   Lives with wife and brother in Social worker and grandson in [place].      Problem Relation Age of Onset  . Heart disease Mother     CAD  . Leukemia Father     ROS: no nausea, vomiting; no fever, chills; no melena, hematochezia; no claudication  PHYSICAL EXAM: BP 119/82  Pulse 79  Ht 5\' 11"  (1.803 m)  Wt 173 lb (78.472 kg)  BMI 24.13 kg/m2 GENERAL: 67 year-old male, sitting upright; NAD  HEENT: NCAT, PERRLA, EOMI; sclera icteric; no xanthelasma  NECK: no bruits; marked JVD noted on the right  LUNGS: CTA bilaterally  CARDIAC: RRR (S1, S2); 2-3/6 holosystolic murmur at the base; no rubs or gallops  ABDOMEN: soft, non-tender; intact BS  EXTREMETIES: Trace peripheral edema SKIN: warm/dry; no obvious rash/lesions  MUSCULOSKELETAL: no joint deformity  NEURO: no  focal deficit; NL affect    EKG: reviewed and available in Electronic Records   ASSESSMENT & PLAN:  Chest pain No further workup indicated. Patient's symptoms are atypical, reproducible by movement. He has not had any recurrent episodes, and denies any exertional component.  He also thought that it was possibly due to ICD discharge, but no evidence was found by subsequent interrogation.  Chronic systolic heart failure Patient reports significant improvement since last OV. He has diuresed 25 pounds. Continue current diuretic regimen and check followup metabolic profile, BNP level. Of note, I am reluctant to uptitrate carvedilol, given history of hypotension.  Syncope Patient was briefly hospitalized in [place], back in October 2013. He reports that he was told this was due to "low blood pressure". No records are currently available. He has requested resuming driving, and I recommended that he refrain from doing so for a full 6 months. He can resume driving this May 1610.  ICD-St.Jude Status post recent ICD generator replacement, by Dr. Johney Frame, secondary to ERI. Recent interrogation yielded no episodes. Followup as scheduled.    Gene Addysin Porco, PAC

## 2012-07-01 NOTE — Assessment & Plan Note (Signed)
Status post recent ICD generator replacement, by Dr. Johney Frame, secondary to ERI. Recent interrogation yielded no episodes. Followup as scheduled.

## 2012-07-01 NOTE — Patient Instructions (Signed)
   Patient may resume driving Oct 03, 2012  Labs today for BMET, BNP  Office will notify of results Continue all current medications. Follow up as scheduled

## 2012-07-01 NOTE — Assessment & Plan Note (Signed)
Patient was briefly hospitalized in [place], back in October 2013. He reports that he was told this was due to "low blood pressure". No records are currently available. He has requested resuming driving, and I recommended that he refrain from doing so for a full 6 months. He can resume driving this May 3086.

## 2012-07-01 NOTE — Assessment & Plan Note (Signed)
Patient reports significant improvement since last OV. He has diuresed 25 pounds. Continue current diuretic regimen and check followup metabolic profile, BNP level. Of note, I am reluctant to uptitrate carvedilol, given history of hypotension.

## 2012-07-01 NOTE — Assessment & Plan Note (Signed)
No further workup indicated. Patient's symptoms are atypical, reproducible by movement. He has not had any recurrent episodes, and denies any exertional component.  He also thought that it was possibly due to ICD discharge, but no evidence was found by subsequent interrogation.

## 2012-07-05 DIAGNOSIS — K7460 Unspecified cirrhosis of liver: Secondary | ICD-10-CM | POA: Diagnosis not present

## 2012-07-05 DIAGNOSIS — K644 Residual hemorrhoidal skin tags: Secondary | ICD-10-CM | POA: Diagnosis not present

## 2012-07-05 DIAGNOSIS — Z951 Presence of aortocoronary bypass graft: Secondary | ICD-10-CM | POA: Diagnosis not present

## 2012-07-05 DIAGNOSIS — Z95810 Presence of automatic (implantable) cardiac defibrillator: Secondary | ICD-10-CM | POA: Diagnosis not present

## 2012-07-05 DIAGNOSIS — I10 Essential (primary) hypertension: Secondary | ICD-10-CM | POA: Diagnosis not present

## 2012-07-09 ENCOUNTER — Encounter (INDEPENDENT_AMBULATORY_CARE_PROVIDER_SITE_OTHER): Payer: Self-pay | Admitting: Internal Medicine

## 2012-07-09 ENCOUNTER — Ambulatory Visit (INDEPENDENT_AMBULATORY_CARE_PROVIDER_SITE_OTHER): Payer: Medicare Other | Admitting: Internal Medicine

## 2012-07-09 ENCOUNTER — Ambulatory Visit (INDEPENDENT_AMBULATORY_CARE_PROVIDER_SITE_OTHER): Payer: Medicare Other | Admitting: *Deleted

## 2012-07-09 VITALS — BP 98/68 | HR 76 | Temp 97.6°F | Ht 71.0 in | Wt 174.6 lb

## 2012-07-09 DIAGNOSIS — I2699 Other pulmonary embolism without acute cor pulmonale: Secondary | ICD-10-CM | POA: Diagnosis not present

## 2012-07-09 DIAGNOSIS — K649 Unspecified hemorrhoids: Secondary | ICD-10-CM | POA: Diagnosis not present

## 2012-07-09 DIAGNOSIS — K7460 Unspecified cirrhosis of liver: Secondary | ICD-10-CM | POA: Diagnosis not present

## 2012-07-09 DIAGNOSIS — R17 Unspecified jaundice: Secondary | ICD-10-CM | POA: Diagnosis not present

## 2012-07-09 DIAGNOSIS — Z7901 Long term (current) use of anticoagulants: Secondary | ICD-10-CM | POA: Diagnosis not present

## 2012-07-09 DIAGNOSIS — K761 Chronic passive congestion of liver: Secondary | ICD-10-CM | POA: Insufficient documentation

## 2012-07-09 LAB — POCT INR: INR: 1.5

## 2012-07-09 NOTE — Patient Instructions (Addendum)
OV in 6 month Hepatic function today. Folic acid and MVI

## 2012-07-09 NOTE — Progress Notes (Signed)
Subjective:     Patient ID: Rick [name], male   DOB: 1946-05-09, 67 y.o.   MRN: [id number]  HPI Her e today for f/u. He tells me he has to strain to have a BM. His stools are hard. Hei s having a BM about every 3 times a day.   Appetite is okay. No weight loss.  No rectal bleeding. He was referred to our office for elevated bilirubin.   Hep B and C markers were negative Hx of cardiomyopathy.  Defibrillator.   Seen in the ED for hemorroids: Given an Rx for Hydrocort AC 25mg  Supp.  06/2012 EGD/Colonoscopy: Impression:  Mild portal gastropathy but no evidence of esophageal or gastric varices.  Normal colonoscopy except external hemorrhoids and small rectal lipoma.  Suspect rectal bleeding secondary to hemorrhoids in the setting of anticoagulation.  Cholestasis most likely secondary to congestive hepatopathy. He could also have cardiac cirrhosis but liver into her does not suggest advanced cirrhosis.  Hepatic Function Panel     Component Value Date/Time   PROT 6.8 05/21/2012 1148   ALBUMIN 2.5* 05/21/2012 1148   AST 16 05/21/2012 1148   ALT 11 05/21/2012 1148   ALKPHOS 80 05/21/2012 1148   BILITOT 5.6* 05/21/2012 1148   BILIDIR 3.5* 05/21/2012 1148   IBILI 2.1* 05/21/2012 1148   AFP 4.6     US abdomen 03/12/2012:  Mild amt of ascites seen in the rt upper quadrant. Markedly thickened gallbladder wall without evidence of gallstone and by report a negative sonographic Murphy's sign. GB wall measure 8.54mm in thickness. This could be factitious wall thickening due to hypoproteinemia or other underlying medical conditions, though chronic cholecystitis cannot be excluded if suspected clinically. Echogenic liver suggesting fatty infiltration or other form of intrinsic liver disease. No focal liver lesion or bile duct dilatation is noted. Mildly dilated and ectatic pancreatic duct, but otherwise sonographically unremarkable appearing pancreas. Increased echogenicity of renal parenchyma suggesting  underlying medical renal disease.  05/2012 Ct abdomen/pelvis with CM: 1. Moderate to large amount of ascites, diffuse body wall edema,  and tiny right pleural effusion.  2. Cardiomegaly and signs of right heart insufficiency.  3. Hepatic cirrhosis. No evidence of neoplasm or splenomegaly  Echo 11/2011: EF of 20%   Review of Systemssee hpi.     Past Surgical History  Procedure Date  . Coronary artery bypass graft 2003    4 vessel  . Cardiac defibrillator placement 11/09/06    SJM Epic II DR ICD implanted in [place], atrial lead no longer functions due to low impedance  . St. jude fortify assura icd 05/27/2012    Model # [id number]  . Colonoscopy with esophagogastroduodenoscopy (egd) 06/06/2012    Procedure: COLONOSCOPY WITH ESOPHAGOGASTRODUODENOSCOPY (EGD);  Surgeon: Malissa Hippo, MD;  Location: [facility];  Service: Endoscopy;  Laterality: N/A;  310   Past Medical History  Diagnosis Date  . Essential hypertension   . Myocardial infarction acute   . Ischemic cardiomyopathy     EF 20% by echo 2013  . Other pulmonary embolism and infarction     on coumadin  . Hyperlipidemia   . DOE (dyspnea on exertion)   . CAD (coronary artery disease)   . CVA (cerebral infarction)   . Anxiety   . Tricuspid regurgitation     Severe  . Pulmonary hypertension     56 mm Hg     Objective:   Physical Exam  Filed Vitals:   07/09/12 1540  BP: 98/68  Pulse: 76  Temp: 97.6 F (36.4 C)  Height: 5\' 11"  (1.803 m)  Weight: 174 lb 9.6 oz (79.198 kg)   Alert and oriented. Skin warm and dry. Oral mucosa is moist.   . Sclera slightly icteric, conjunctivae is pink. Thyroid not enlarged. No cervical lymphadenopathy. Lungs clear. Heart regular rate and rhythm.  Abdomen is soft. Bowel sounds are positive. No hepatomegaly. No abdominal masses felt. No tenderness.  No edema to lower extremities.       Assessment:    Hx of cirrhosis. Elevated bilirubin. Cirrhosis. EF 20%. Patient is not a  candidate for liver transplant Hemorrhoids: presently taking a Hydrocortisone supp for his hemorroids.    Plan:  Hepatic function today. Stool softner BID. OV next month with Dr. Karilyn Cota. Contine Hydrocortisone supp.

## 2012-07-10 ENCOUNTER — Encounter: Payer: Self-pay | Admitting: *Deleted

## 2012-07-10 LAB — HEPATIC FUNCTION PANEL
Bilirubin, Direct: 1.5 mg/dL — ABNORMAL HIGH (ref 0.0–0.3)
Indirect Bilirubin: 1.3 mg/dL — ABNORMAL HIGH (ref 0.0–0.9)

## 2012-07-11 ENCOUNTER — Encounter: Payer: Self-pay | Admitting: Internal Medicine

## 2012-07-11 DIAGNOSIS — Z23 Encounter for immunization: Secondary | ICD-10-CM | POA: Diagnosis not present

## 2012-07-23 ENCOUNTER — Ambulatory Visit (INDEPENDENT_AMBULATORY_CARE_PROVIDER_SITE_OTHER): Payer: Medicare Other | Admitting: *Deleted

## 2012-07-23 DIAGNOSIS — Z7901 Long term (current) use of anticoagulants: Secondary | ICD-10-CM

## 2012-07-23 DIAGNOSIS — I2699 Other pulmonary embolism without acute cor pulmonale: Secondary | ICD-10-CM | POA: Diagnosis not present

## 2012-07-23 LAB — POCT INR: INR: 1.4

## 2012-08-06 ENCOUNTER — Ambulatory Visit (INDEPENDENT_AMBULATORY_CARE_PROVIDER_SITE_OTHER): Payer: Medicare Other | Admitting: Internal Medicine

## 2012-08-06 ENCOUNTER — Ambulatory Visit (INDEPENDENT_AMBULATORY_CARE_PROVIDER_SITE_OTHER): Payer: Medicare Other | Admitting: *Deleted

## 2012-08-06 ENCOUNTER — Encounter (INDEPENDENT_AMBULATORY_CARE_PROVIDER_SITE_OTHER): Payer: Self-pay | Admitting: Internal Medicine

## 2012-08-06 VITALS — BP 98/70 | HR 76 | Temp 97.7°F | Resp 18 | Ht 71.0 in | Wt 169.7 lb

## 2012-08-06 DIAGNOSIS — D649 Anemia, unspecified: Secondary | ICD-10-CM | POA: Diagnosis not present

## 2012-08-06 DIAGNOSIS — Z7901 Long term (current) use of anticoagulants: Secondary | ICD-10-CM

## 2012-08-06 DIAGNOSIS — I2699 Other pulmonary embolism without acute cor pulmonale: Secondary | ICD-10-CM

## 2012-08-06 DIAGNOSIS — K7460 Unspecified cirrhosis of liver: Secondary | ICD-10-CM

## 2012-08-06 LAB — POCT INR: INR: 1.5

## 2012-08-06 NOTE — Progress Notes (Signed)
Presenting complaint;  Followup for cirrhosis and ascites.  Subjective:  Patient is 67 year old African male who is here for scheduled visit accompanied by his wife. He was last seen one month ago. He has lost 5 pounds. He reports no increase in abdominal distention. He feels little bit stronger and able to walk a few more yards before he gets short of breath. He has very good appetite. He still has intermittent small volume hematochezia. He said 4-5 episodes since his endoscopic evaluation in January 2014. He denies nausea vomiting or heartburn. His wife states he's never been confused and his sleeping pattern has not changed. He has chronic constipation and having good results from combination of stool softener and Exlax. He used to drink alcohol socially but has not had any in at least 4-5 years. He has received first 2 doses of Hep A and B vaccination and third dose will be due in 4 months.  Current Medications: Current Outpatient Prescriptions  Medication Sig Dispense Refill  . carvedilol (COREG) 6.25 MG tablet Take 1 tablet (6.25 mg total) by mouth 2 (two) times daily with a meal.  60 tablet  3  . docusate sodium (COLACE) 100 MG capsule Take 100 mg by mouth 3 (three) times daily.      [name] FLUoxetine (PROZAC) 40 MG capsule Take 40 mg by mouth daily.      . folic acid (FOLVITE) 400 MCG tablet Take 400 mcg by mouth daily.      . furosemide (LASIX) 80 MG tablet Take 80-120 mg by mouth 2 (two) times daily. Per patient's wife the patient is taking 1 1/2 tablet in the morning and 1tablet in the evening      . isosorbide mononitrate (IMDUR) 30 MG 24 hr tablet Take 1 tablet (30 mg total) by mouth daily.  30 tablet  3  . lisinopril (PRINIVIL,ZESTRIL) 2.5 MG tablet Take 1 tablet (2.5 mg total) by mouth 2 (two) times daily.  60 tablet  6  . spironolactone (ALDACTONE) 25 MG tablet Take 75 mg by mouth daily.      [name] warfarin (COUMADIN) 3 MG tablet Take 3 mg by mouth as directed. take 1/2 daily of a  3mg  MANAGED BY LISA       No current facility-administered medications for this visit.     Objective: Blood pressure 98/70, pulse 76, temperature 97.7 F (36.5 C), temperature source Oral, resp. rate 18, height 5\' 11"  (1.803 m), weight 169 lb 11.2 oz (76.975 kg). Patient is alert and in no acute distress. Conjunctiva is pink. Sclera is nonicteric Oropharyngeal mucosa is normal. No neck masses or thyromegaly noted. Cardiac exam with regular rhythm normal S1 and S2. He has loud systolic murmur best heard at aortic area. Lungs are clear to auscultation. Abdomen is distended but not tense. Shifting dullness is present. Abdomen is soft with palpable liver which is firm.  No LE edema or clubbing noted.  Labs/studies Results: LFTs from 07/09/2012. Total bilirubin 2.8 direct 1.5, AP 86, AST 12, ALT less than 8 and albumin 3.0    Assessment:  #1. Cirrhosis. Workup is negative for hepatitis B, hepatitis C alpha-1 antitrypsin deficiency autoimmune hepatitis or hemochromatosis. 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 Last serum albumin was 3.0, better than it was 2 months ago and may indicate improvement in hepatic function. Similarly drop in serum bilirubin felt to be secondary to decrease in hepatic congestion. EGD  in January 2014 was negative for esophageal or gastric varices but did reveal mild portal gastropathy. #2. Ascites. He has lost 5 pounds since his last visit. Ascites does not appear to be tense at this time. #3. Anemia felt to be chronic disease anemia. He has intermittent small volume hematochezia not enough to account for anemia. Will keep and I on his H&H.   Plan:  Patient will have CBC and comprehensive chemistry panel. Patient will call the office if he gains 7 pounds or develops abdominal distention. Office visit in 4 months.

## 2012-08-06 NOTE — Patient Instructions (Addendum)
Call if abdominal distention gets worse or if you have gained more than 7 pounds. Physician will contact you with results of blood work when completed.

## 2012-08-07 ENCOUNTER — Telehealth (INDEPENDENT_AMBULATORY_CARE_PROVIDER_SITE_OTHER): Payer: Self-pay | Admitting: *Deleted

## 2012-08-07 NOTE — Telephone Encounter (Signed)
The lab orders have already been put in . Patient was going to have them drawn at the time of his Cardiologist visit

## 2012-08-16 ENCOUNTER — Ambulatory Visit (INDEPENDENT_AMBULATORY_CARE_PROVIDER_SITE_OTHER): Payer: Medicare Other | Admitting: *Deleted

## 2012-08-16 DIAGNOSIS — I2699 Other pulmonary embolism without acute cor pulmonale: Secondary | ICD-10-CM | POA: Diagnosis not present

## 2012-08-16 DIAGNOSIS — D649 Anemia, unspecified: Secondary | ICD-10-CM | POA: Diagnosis not present

## 2012-08-16 DIAGNOSIS — K7460 Unspecified cirrhosis of liver: Secondary | ICD-10-CM | POA: Diagnosis not present

## 2012-08-16 DIAGNOSIS — R188 Other ascites: Secondary | ICD-10-CM | POA: Diagnosis not present

## 2012-08-16 LAB — POCT INR: INR: 1.5

## 2012-08-21 ENCOUNTER — Encounter: Payer: Self-pay | Admitting: Internal Medicine

## 2012-08-21 ENCOUNTER — Ambulatory Visit (INDEPENDENT_AMBULATORY_CARE_PROVIDER_SITE_OTHER): Payer: Medicare Other | Admitting: Internal Medicine

## 2012-08-21 VITALS — BP 109/75 | HR 75 | Ht 71.0 in | Wt 170.0 lb

## 2012-08-21 DIAGNOSIS — I5022 Chronic systolic (congestive) heart failure: Secondary | ICD-10-CM | POA: Diagnosis not present

## 2012-08-21 DIAGNOSIS — R55 Syncope and collapse: Secondary | ICD-10-CM

## 2012-08-21 DIAGNOSIS — I2589 Other forms of chronic ischemic heart disease: Secondary | ICD-10-CM

## 2012-08-21 LAB — ICD DEVICE OBSERVATION
DEVICE MODEL ICD: 705822
RV LEAD AMPLITUDE: 11.7 mv
RV LEAD IMPEDENCE ICD: 510 Ohm
RV LEAD THRESHOLD: 0.75 V
VENTRICULAR PACING ICD: 1 pct

## 2012-08-21 NOTE — Progress Notes (Signed)
PCP: Rick Melena, MD Primary Cardiologist:  Rick [name] is a 67 y.o. male who presents today for routine electrophysiology followup.  Since his recent generator change, Rick [name] reports doing very well.  Today, he denies symptoms of palpitations, chest pain, shortness of breath,  lower extremity edema, dizziness, presyncope, syncope, or ICD shocks.  Rick [name] is otherwise without complaint today.   Past Medical History  Diagnosis Date  . Essential hypertension   . Myocardial infarction acute   . Ischemic cardiomyopathy     EF 20% by echo 2013  . Other pulmonary embolism and infarction     on coumadin  . Hyperlipidemia   . DOE (dyspnea on exertion)   . CAD (coronary artery disease)   . CVA (cerebral infarction)   . Anxiety   . Tricuspid regurgitation     Severe  . Pulmonary hypertension     56 mm Hg   Past Surgical History  Procedure Laterality Date  . Coronary artery bypass graft  2003    4 vessel  . Cardiac defibrillator placement  11/09/06    SJM Epic II Rick ICD implanted in [place], atrial lead no longer functions due to low impedance  . Implantable cardioverter defibrillator generator change  05/27/2012    Gen change.  SJM Assura VR.  Riata V lead  . Colonoscopy with esophagogastroduodenoscopy (egd)  06/06/2012    Procedure: COLONOSCOPY WITH ESOPHAGOGASTRODUODENOSCOPY (EGD);  Surgeon: Malissa Hippo, MD;  Location[facility];  Service: Endoscopy;  Laterality: N/A;  310    Current Outpatient Prescriptions  Medication Sig Dispense Refill  . carvedilol (COREG) 6.25 MG tablet Take 1 tablet (6.25 mg total) by mouth 2 (two) times daily with a meal.  60 tablet  3  . docusate sodium (COLACE) 100 MG capsule Take 100 mg by mouth 3 (three) times daily.      [name] FLUoxetine (PROZAC) 40 MG capsule Take 40 mg by mouth daily.      . folic acid (FOLVITE) 400 MCG tablet Take 400 mcg by mouth daily.      . furosemide (LASIX) 80 MG tablet Take 80-120 mg by mouth 2  (two) times daily. Per [name]'s wife Rick [name] is taking 1 1/2 tablet in Rick morning and 1tablet in Rick evening      . isosorbide mononitrate (IMDUR) 30 MG 24 hr tablet Take 1 tablet (30 mg total) by mouth daily.  30 tablet  3  . lisinopril (PRINIVIL,ZESTRIL) 2.5 MG tablet Take 1 tablet (2.5 mg total) by mouth 2 (two) times daily.  60 tablet  6  . spironolactone (ALDACTONE) 25 MG tablet Take 75 mg by mouth daily.      [name] warfarin (COUMADIN) 3 MG tablet Take 3 mg by mouth as directed. take 1/2 daily of a 3mg  MANAGED BY LISA       No current facility-administered medications for this visit.    Physical Exam: Filed Vitals:   08/21/12 1102  BP: 109/75  Pulse: 75  Height: 5\' 11"  (1.803 m)  Weight: 170 lb (77.111 kg)    GEN- Rick [name] is well appearing, alert and oriented x 3 today.   Head- normocephalic, atraumatic Eyes-  Sclera clear, conjunctiva pink Ears- hearing intact Oropharynx- clear Lungs- Clear to ausculation bilaterally, normal work of breathing Chest- ICD pocket is well healed Heart- Regular rate and rhythm,3/6 SEM LLSB GI- soft, NT, ND, + BS Extremities- no clubbing, cyanosis, or edema  ICD interrogation- reviewed in detail  today,  See PACEART report  Assessment and Plan:  1.  Chronic systolic dysfunction euvolemic today Stable on an appropriate medical regimen Normal ICD function See Pace Art report No changes today  2. Syncope No further episodes Return to driving if no further episodes 5/14.  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

## 2012-08-21 NOTE — Patient Instructions (Addendum)
Your physician recommends that you schedule a follow-up appointment in: 3 months with Dr. Antoine Poche.  Your physician recommends that you schedule a follow-up appointment in 8 months with Dr. Johney Frame.  You will receive a reminder letter in the mail in about 6 months reminding you to call and schedule your appointment. If you don't receive this letter, please contact our office. Your physician recommends that you continue on your current medications as directed. Please refer to the Current Medication list given to you today.

## 2012-08-23 ENCOUNTER — Ambulatory Visit (INDEPENDENT_AMBULATORY_CARE_PROVIDER_SITE_OTHER): Payer: Medicare Other | Admitting: *Deleted

## 2012-08-23 DIAGNOSIS — I2699 Other pulmonary embolism without acute cor pulmonale: Secondary | ICD-10-CM

## 2012-08-23 DIAGNOSIS — Z7901 Long term (current) use of anticoagulants: Secondary | ICD-10-CM

## 2012-09-03 ENCOUNTER — Ambulatory Visit (INDEPENDENT_AMBULATORY_CARE_PROVIDER_SITE_OTHER): Payer: Medicare Other | Admitting: *Deleted

## 2012-09-03 DIAGNOSIS — I2699 Other pulmonary embolism without acute cor pulmonale: Secondary | ICD-10-CM | POA: Diagnosis not present

## 2012-09-03 DIAGNOSIS — Z7901 Long term (current) use of anticoagulants: Secondary | ICD-10-CM | POA: Diagnosis not present

## 2012-09-03 MED ORDER — WARFARIN SODIUM 3 MG PO TABS: ORAL_TABLET | ORAL | Status: DC

## 2012-09-05 ENCOUNTER — Other Ambulatory Visit: Payer: Self-pay | Admitting: Physician Assistant

## 2012-09-12 ENCOUNTER — Other Ambulatory Visit: Payer: Self-pay | Admitting: Cardiology

## 2012-09-16 ENCOUNTER — Ambulatory Visit (INDEPENDENT_AMBULATORY_CARE_PROVIDER_SITE_OTHER): Payer: Medicare Other | Admitting: *Deleted

## 2012-09-16 DIAGNOSIS — I2699 Other pulmonary embolism without acute cor pulmonale: Secondary | ICD-10-CM | POA: Diagnosis not present

## 2012-09-16 DIAGNOSIS — Z7901 Long term (current) use of anticoagulants: Secondary | ICD-10-CM | POA: Diagnosis not present

## 2012-09-16 DIAGNOSIS — I509 Heart failure, unspecified: Secondary | ICD-10-CM | POA: Diagnosis not present

## 2012-09-16 DIAGNOSIS — T81718A Complication of other artery following a procedure, not elsewhere classified, initial encounter: Secondary | ICD-10-CM | POA: Diagnosis not present

## 2012-09-16 DIAGNOSIS — K7460 Unspecified cirrhosis of liver: Secondary | ICD-10-CM | POA: Diagnosis not present

## 2012-09-16 LAB — POCT INR: INR: 2

## 2012-10-07 ENCOUNTER — Ambulatory Visit (INDEPENDENT_AMBULATORY_CARE_PROVIDER_SITE_OTHER): Payer: Medicare Other | Admitting: *Deleted

## 2012-10-07 DIAGNOSIS — Z7901 Long term (current) use of anticoagulants: Secondary | ICD-10-CM | POA: Diagnosis not present

## 2012-10-07 DIAGNOSIS — I2699 Other pulmonary embolism without acute cor pulmonale: Secondary | ICD-10-CM

## 2012-10-17 ENCOUNTER — Encounter: Payer: Self-pay | Admitting: Cardiology

## 2012-10-25 ENCOUNTER — Ambulatory Visit (INDEPENDENT_AMBULATORY_CARE_PROVIDER_SITE_OTHER): Payer: Medicare Other | Admitting: *Deleted

## 2012-10-25 DIAGNOSIS — Z7901 Long term (current) use of anticoagulants: Secondary | ICD-10-CM

## 2012-10-25 DIAGNOSIS — I2699 Other pulmonary embolism without acute cor pulmonale: Secondary | ICD-10-CM | POA: Diagnosis not present

## 2012-10-25 LAB — POCT INR: INR: 2

## 2012-11-25 ENCOUNTER — Encounter: Payer: Medicare Other | Admitting: *Deleted

## 2012-11-25 ENCOUNTER — Encounter: Payer: Self-pay | Admitting: *Deleted

## 2012-11-26 ENCOUNTER — Encounter: Payer: Self-pay | Admitting: Cardiology

## 2012-11-26 ENCOUNTER — Ambulatory Visit (INDEPENDENT_AMBULATORY_CARE_PROVIDER_SITE_OTHER): Payer: Medicare Other | Admitting: *Deleted

## 2012-11-26 ENCOUNTER — Ambulatory Visit (INDEPENDENT_AMBULATORY_CARE_PROVIDER_SITE_OTHER): Payer: Medicare Other | Admitting: Cardiology

## 2012-11-26 VITALS — BP 92/57 | HR 70 | Ht 71.0 in | Wt 175.8 lb

## 2012-11-26 DIAGNOSIS — I5022 Chronic systolic (congestive) heart failure: Secondary | ICD-10-CM | POA: Diagnosis not present

## 2012-11-26 DIAGNOSIS — I2589 Other forms of chronic ischemic heart disease: Secondary | ICD-10-CM

## 2012-11-26 DIAGNOSIS — I2699 Other pulmonary embolism without acute cor pulmonale: Secondary | ICD-10-CM

## 2012-11-26 DIAGNOSIS — I255 Ischemic cardiomyopathy: Secondary | ICD-10-CM

## 2012-11-26 DIAGNOSIS — Z7901 Long term (current) use of anticoagulants: Secondary | ICD-10-CM

## 2012-11-26 DIAGNOSIS — I10 Essential (primary) hypertension: Secondary | ICD-10-CM | POA: Diagnosis not present

## 2012-11-26 LAB — POCT INR: INR: 3.6

## 2012-11-26 NOTE — Patient Instructions (Addendum)

## 2012-11-26 NOTE — Progress Notes (Signed)
HPI The patient is presenting as a new patient for followup of an ischemic cardiomyopathy. He has a history of distant bypass grafting done [place]. He's also had stenting in the past.  He has severe tricuspid regurgitation with an EF of about 20%.  He is status post ICD.    Since I last saw him he has had no new cardiovascular complaints. He has no new exertional dyspnea, PND or orthopnea. He's not had recent syncopal episodes. He doesn't notice any palpitations, presyncope. He's had no chest pressure, neck or arm discomfort. He has no weight gain or edema.  He has mild ankle edema but no weight gain.    No Known Allergies  Current Outpatient Prescriptions  Medication Sig Dispense Refill  . carvedilol (COREG) 6.25 MG tablet Take 1 tablet (6.25 mg total) by mouth 2 (two) times daily with a meal.  60 tablet  3  . docusate sodium (COLACE) 100 MG capsule Take 100 mg by mouth 3 (three) times daily.      [name] FLUoxetine (PROZAC) 40 MG capsule Take 40 mg by mouth daily.      . folic acid (FOLVITE) 400 MCG tablet Take 400 mcg by mouth daily.      . furosemide (LASIX) 80 MG tablet Take 1&1/2 tablets by mouth in the morning and 1 tablet by mouth in the evening  75 tablet  3  . isosorbide mononitrate (IMDUR) 30 MG 24 hr tablet TAKE ONE TABLET BY MOUTH EVERY DAY  30 tablet  3  . lisinopril (PRINIVIL,ZESTRIL) 2.5 MG tablet Take 1 tablet (2.5 mg total) by mouth 2 (two) times daily.  60 tablet  6  . spironolactone (ALDACTONE) 25 MG tablet Take 75 mg by mouth daily.      [name] warfarin (COUMADIN) 3 MG tablet Take coumadin 1 1/2 tablet daily except 1 tablet on Mondays, Wednesdays and Fridays MANAGED BY LISA  45 tablet  3   No current facility-administered medications for this visit.    Past Medical History  Diagnosis Date  . Essential hypertension   . Myocardial infarction acute   . Ischemic cardiomyopathy     EF 20% by echo 2013  . Other pulmonary embolism and infarction     on coumadin  .  Hyperlipidemia   . DOE (dyspnea on exertion)   . CAD (coronary artery disease)   . CVA (cerebral infarction)   . Anxiety   . Tricuspid regurgitation     Severe  . Pulmonary hypertension     56 mm Hg    Past Surgical History  Procedure Date  . Coronary artery bypass graft 2003    4 vessel  . ICD     ROS:  As stated in the HPI and negative for all other systems.  PHYSICAL EXAM BP 92/57  Pulse 70  Ht 5\' 11"  (1.803 m)  Wt 175 lb 12.8 oz (79.742 kg)  BMI 24.53 kg/m2 GENERAL:  Well appearing HEENT:  Pupils equal round and reactive, fundi not visualized, oral mucosa unremarkable, dentures NECK:  Jugular venous distention to the jaw with a CV wave, waveform within normal limits, carotid upstroke brisk and symmetric, no bruits, no thyromegaly LYMPHATICS:  No cervical, inguinal adenopathy LUNGS:  Clear to auscultation bilaterally BACK:  No CVA tenderness CHEST:  Well healed ICD pocket, Well healed sternotomy scar. HEART:  PMI not displaced or sustained,S1 and S2 within normal limits, no S3, no S4, no clicks, no rubs, 3/6 left mid sternal border holosystolic murmurs  ABD:  Flat, positive bowel sounds normal in frequency in pitch, no bruits, no rebound, no guarding, no midline pulsatile mass, no hepatomegaly, no splenomegaly EXT:  2 plus pulses throughout, mild ankle edema, no cyanosis no clubbing, absent left radial  ASSESSMENT AND PLAN  Ischemic cardiomyopathy -  He seems to be euvolemic.  His blood pressure will not allow med titration.  Other pulmonary embolism and infarction -  At this point with a severely reduced ejection fraction and this history I will continue his warfarin.   Essential hypertension -  His blood pressure is actually low but he tolerates this.  No change in therapy.   H/O atherosclerotic cardiovascular disease -  The patient has no new sypmtoms.  No further cardiovascular testing is indicated.  We will continue with aggressive risk reduction and meds as  listed.  ICD (implantable cardiac defibrillator) battery depletion -  The patient is up to date with follow up.

## 2012-12-09 ENCOUNTER — Ambulatory Visit (INDEPENDENT_AMBULATORY_CARE_PROVIDER_SITE_OTHER): Payer: Medicare Other | Admitting: Internal Medicine

## 2012-12-11 ENCOUNTER — Ambulatory Visit (INDEPENDENT_AMBULATORY_CARE_PROVIDER_SITE_OTHER): Payer: Medicare Other | Admitting: *Deleted

## 2012-12-11 ENCOUNTER — Encounter (INDEPENDENT_AMBULATORY_CARE_PROVIDER_SITE_OTHER): Payer: Self-pay | Admitting: Internal Medicine

## 2012-12-11 ENCOUNTER — Ambulatory Visit (INDEPENDENT_AMBULATORY_CARE_PROVIDER_SITE_OTHER): Payer: Medicare Other | Admitting: Internal Medicine

## 2012-12-11 VITALS — BP 98/72 | HR 76 | Temp 98.4°F | Resp 18 | Ht 71.0 in | Wt 173.2 lb

## 2012-12-11 DIAGNOSIS — I2589 Other forms of chronic ischemic heart disease: Secondary | ICD-10-CM

## 2012-12-11 DIAGNOSIS — K761 Chronic passive congestion of liver: Secondary | ICD-10-CM | POA: Diagnosis not present

## 2012-12-11 DIAGNOSIS — R188 Other ascites: Secondary | ICD-10-CM | POA: Diagnosis not present

## 2012-12-11 DIAGNOSIS — Z95810 Presence of automatic (implantable) cardiac defibrillator: Secondary | ICD-10-CM | POA: Diagnosis not present

## 2012-12-11 DIAGNOSIS — I255 Ischemic cardiomyopathy: Secondary | ICD-10-CM

## 2012-12-11 NOTE — Progress Notes (Addendum)
Presenting complaint;  Followup for ascites and cholestasis.  Subjective:  Rick [name] is a 67 year old African male who is here for scheduled visit accompanied by his wife. He was initially seen in our office in November 2013 for bilirubin of 6.3 g and ascites and suspected cirrhosis. Biochemical markers for liver disease were negative. 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 He had EGD and colonoscopy and generally 2014 to get mild portal gastropathy but no evidence of his aphasia or gastric varices. He also had a colonoscopy revealing external hemorrhoids and small rectal lipoma. On his last visit of 08/06/2012 he weighed 169 pounds and 11 ounces. He has gained little over 3 pounds. He has early satiety. He gets filled up with half of his usual meal. He denies heartburn nausea vomiting or abdominal pain. His bowels move daily. He denies melena or rectal bleeding. He is due for third dose of hepatitis B. Vaccination.   Current Medications: Current Outpatient Prescriptions  Medication Sig Dispense Refill  . carvedilol (COREG) 6.25 MG tablet Take 1 tablet (6.25 mg total) by mouth 2 (two) times daily with a meal.  60 tablet  3  . DOCUSATE SODIUM PO Take 50 mg by mouth daily.      [name] FLUoxetine (PROZAC) 40 MG capsule Take 40 mg by mouth daily.      . folic acid (FOLVITE) 400 MCG tablet Take 400 mcg by mouth daily.      . furosemide (LASIX) 80 MG tablet Take 1&1/2 tablets by mouth in the morning and 1 tablet by mouth in the evening  75 tablet  3  . isosorbide mononitrate (IMDUR) 30 MG 24 hr tablet TAKE ONE TABLET BY MOUTH EVERY DAY  30 tablet  3  . lisinopril (PRINIVIL,ZESTRIL) 2.5 MG tablet Take 1 tablet (2.5 mg total) by mouth 2 (two) times daily.  60 tablet  6  . spironolactone (ALDACTONE) 25 MG tablet Take 75 mg by mouth daily.      [name] warfarin (COUMADIN) 3 MG tablet Take coumadin 1 1/2 tablet daily except 1 tablet on Mondays, Wednesdays and Fridays MANAGED BY LISA  45 tablet  3   No current  facility-administered medications for this visit.     Objective: Blood pressure 98/72, pulse 76, temperature 98.4 F (36.9 C), temperature source Oral, resp. rate 18, height 5\' 11"  (1.803 m), weight 173 lb 3.2 oz (78.563 kg). Patient is alert and in no acute distress. Conjunctiva is pink. Sclera is nonicteric Oropharyngeal mucosa is normal. No neck masses or thyromegaly noted. Cardiac exam with regular rhythm normal S1 and S2.  Grade 3/6 holosystolic murmur noted at LLSB Lungs are clear to auscultation. Abdomen is flat. Shifting dullness is absent. Liver edge is 9-10 cm below RCM at end inspiration but span is 15 cm. Liver is pulsatile.  No LE edema or clubbing noted.   Assessment:  #1. Congestive adenopathy secondary to ischemic cardiomyopathy resulting in hyperbilirubinemia and ascites. Clinically he does not have ascites and similarly he does not appear to be jaundiced. He does not have any evidence of primary liver disease but he certainly is at risk for cardiac cirrhosis. #2. Unilateral mild gynecomastia involving left breast secondary to spironolactone. Will monitor.    Plan:  Patient will go to the lab for portal indirect bilirubin and metabolic 7. Office visit in 6 months.

## 2012-12-11 NOTE — Patient Instructions (Signed)
Physician will contact you with results of blood work. 

## 2012-12-12 ENCOUNTER — Encounter: Payer: Self-pay | Admitting: Internal Medicine

## 2012-12-12 LAB — REMOTE ICD DEVICE
BRDY-0002RV: 40 {beats}/min
DEVICE MODEL ICD: 7025822
HV IMPEDENCE: 40 Ohm
RV LEAD AMPLITUDE: 11.7 mv
TZON-0010SLOWVT: 40 ms
VENTRICULAR PACING ICD: 1 pct

## 2012-12-12 LAB — BILIRUBIN, DIRECT: Bilirubin, Direct: 0.7 mg/dL — ABNORMAL HIGH (ref 0.0–0.3)

## 2012-12-12 LAB — BASIC METABOLIC PANEL
CO2: 21 mEq/L (ref 19–32)
Calcium: 9.4 mg/dL (ref 8.4–10.5)
Creat: 1.18 mg/dL (ref 0.50–1.35)
Sodium: 133 mEq/L — ABNORMAL LOW (ref 135–145)

## 2012-12-13 ENCOUNTER — Ambulatory Visit (INDEPENDENT_AMBULATORY_CARE_PROVIDER_SITE_OTHER): Payer: Medicare Other | Admitting: *Deleted

## 2012-12-13 DIAGNOSIS — I2699 Other pulmonary embolism without acute cor pulmonale: Secondary | ICD-10-CM | POA: Diagnosis not present

## 2012-12-13 DIAGNOSIS — Z7901 Long term (current) use of anticoagulants: Secondary | ICD-10-CM

## 2012-12-13 LAB — POCT INR: INR: 2.5

## 2012-12-19 DIAGNOSIS — K7460 Unspecified cirrhosis of liver: Secondary | ICD-10-CM | POA: Diagnosis not present

## 2012-12-19 DIAGNOSIS — I10 Essential (primary) hypertension: Secondary | ICD-10-CM | POA: Diagnosis not present

## 2012-12-19 DIAGNOSIS — I509 Heart failure, unspecified: Secondary | ICD-10-CM | POA: Diagnosis not present

## 2012-12-19 DIAGNOSIS — I259 Chronic ischemic heart disease, unspecified: Secondary | ICD-10-CM | POA: Diagnosis not present

## 2012-12-20 ENCOUNTER — Encounter: Payer: Self-pay | Admitting: Cardiology

## 2012-12-20 ENCOUNTER — Encounter: Payer: Self-pay | Admitting: *Deleted

## 2012-12-25 ENCOUNTER — Other Ambulatory Visit: Payer: Self-pay | Admitting: *Deleted

## 2012-12-25 MED ORDER — CARVEDILOL 6.25 MG PO TABS: 6.2500 mg | ORAL_TABLET | Freq: Two times a day (BID) | ORAL | Status: DC

## 2013-01-03 ENCOUNTER — Ambulatory Visit (INDEPENDENT_AMBULATORY_CARE_PROVIDER_SITE_OTHER): Payer: Medicare Other | Admitting: *Deleted

## 2013-01-03 DIAGNOSIS — Z7901 Long term (current) use of anticoagulants: Secondary | ICD-10-CM

## 2013-01-03 DIAGNOSIS — I2699 Other pulmonary embolism without acute cor pulmonale: Secondary | ICD-10-CM

## 2013-01-06 DIAGNOSIS — I5022 Chronic systolic (congestive) heart failure: Secondary | ICD-10-CM | POA: Diagnosis not present

## 2013-01-06 DIAGNOSIS — I10 Essential (primary) hypertension: Secondary | ICD-10-CM | POA: Diagnosis not present

## 2013-01-06 DIAGNOSIS — I428 Other cardiomyopathies: Secondary | ICD-10-CM | POA: Diagnosis not present

## 2013-01-07 ENCOUNTER — Other Ambulatory Visit: Payer: Self-pay | Admitting: *Deleted

## 2013-01-07 MED ORDER — LISINOPRIL 2.5 MG PO TABS: 2.5000 mg | ORAL_TABLET | Freq: Two times a day (BID) | ORAL | Status: DC

## 2013-01-15 ENCOUNTER — Other Ambulatory Visit: Payer: Self-pay | Admitting: Cardiology

## 2013-01-15 MED ORDER — WARFARIN SODIUM 3 MG PO TABS: ORAL_TABLET | ORAL | Status: DC

## 2013-01-15 NOTE — Telephone Encounter (Signed)
Refill

## 2013-01-31 ENCOUNTER — Telehealth: Payer: Self-pay | Admitting: Cardiology

## 2013-01-31 ENCOUNTER — Ambulatory Visit (INDEPENDENT_AMBULATORY_CARE_PROVIDER_SITE_OTHER): Payer: Medicare Other | Admitting: *Deleted

## 2013-01-31 DIAGNOSIS — Z7901 Long term (current) use of anticoagulants: Secondary | ICD-10-CM

## 2013-01-31 DIAGNOSIS — I2699 Other pulmonary embolism without acute cor pulmonale: Secondary | ICD-10-CM

## 2013-01-31 LAB — POCT INR: INR: 2

## 2013-01-31 NOTE — Telephone Encounter (Signed)
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   York Spaniel that he has application in and his old cardiologist called him telling him that he could not and he didn't understand why he was calling and not [place]?

## 2013-02-04 NOTE — Telephone Encounter (Signed)
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 Patient said they sent the information to our office around 07/14/14about his blood pressure. 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 Nurse advised patient that our office hasn't received anything from a DOT physical and he should contact the provider who did the DOT physical and have them fax to our office. Nurse also advised patient to contact his old cardiologist office and inform them that he has established cardiology care at our office and if they've received any request from his DOT physical, they should forward that request to [place]. Patient verbalized understanding of plan.

## 2013-03-10 ENCOUNTER — Ambulatory Visit (INDEPENDENT_AMBULATORY_CARE_PROVIDER_SITE_OTHER): Payer: Medicare Other | Admitting: Internal Medicine

## 2013-03-10 ENCOUNTER — Encounter: Payer: Self-pay | Admitting: Internal Medicine

## 2013-03-10 ENCOUNTER — Telehealth: Payer: Self-pay | Admitting: Cardiology

## 2013-03-10 VITALS — BP 111/70 | HR 76 | Ht 71.0 in | Wt 199.0 lb

## 2013-03-10 DIAGNOSIS — I10 Essential (primary) hypertension: Secondary | ICD-10-CM | POA: Diagnosis not present

## 2013-03-10 DIAGNOSIS — R079 Chest pain, unspecified: Secondary | ICD-10-CM

## 2013-03-10 DIAGNOSIS — R0602 Shortness of breath: Secondary | ICD-10-CM

## 2013-03-10 DIAGNOSIS — I5022 Chronic systolic (congestive) heart failure: Secondary | ICD-10-CM | POA: Diagnosis not present

## 2013-03-10 LAB — CBC WITH DIFFERENTIAL/PLATELET
Eosinophils Relative: 1 % (ref 0–5)
Lymphocytes Relative: 30 % (ref 12–46)
Lymphs Abs: 1.7 10*3/uL (ref 0.7–4.0)
MCV: 92.9 fL (ref 78.0–100.0)
Neutro Abs: 2.9 10*3/uL (ref 1.7–7.7)
Platelets: 161 10*3/uL (ref 150–400)
RBC: 3.51 MIL/uL — ABNORMAL LOW (ref 4.22–5.81)
WBC: 5.6 10*3/uL (ref 4.0–10.5)

## 2013-03-10 MED ORDER — FLUOXETINE HCL 40 MG PO CAPS: 40.0000 mg | ORAL_CAPSULE | Freq: Every day | ORAL | Status: DC

## 2013-03-10 MED ORDER — FUROSEMIDE 80 MG PO TABS: ORAL_TABLET | ORAL | Status: DC

## 2013-03-10 NOTE — Telephone Encounter (Signed)
Patient continues to have shortness of breath, retaining fluid no chest pains.

## 2013-03-10 NOTE — Progress Notes (Signed)
HPI The patient is presenting as a new patient for followup of an ischemic cardiomyopathy. He has a history of distant bypass grafting done [place]. He's also had stenting in the past. He has severe tricuspid regurgitation with an EF of about 20%. He is status post ICD.   The patient was seen by Daiva Nakayama in June  He called in to c/o increased LE edema and a 24 lb wt gain. Patient notes gaining wt since AUg. Feet are tighter.   Face swollen this morning Breathing is shorter with inclines. No PND  No orthopnea.  With SOB gets numbness in chest.  Eating out occasionally  Has stopped No Known Allergies  Current Outpatient Prescriptions  Medication Sig Dispense Refill  . carvedilol (COREG) 6.25 MG tablet Take 1 tablet (6.25 mg total) by mouth 2 (two) times daily with a meal.  60 tablet  6  . DOCUSATE SODIUM PO Take 50 mg by mouth daily.      [name] FLUoxetine (PROZAC) 40 MG capsule Take 40 mg by mouth daily.      . folic acid (FOLVITE) 400 MCG tablet Take 400 mcg by mouth daily.      . furosemide (LASIX) 80 MG tablet Take 1&1/2 tablets by mouth in the morning and 1 tablet by mouth in the evening  75 tablet  3  . isosorbide mononitrate (IMDUR) 30 MG 24 hr tablet TAKE ONE TABLET BY MOUTH EVERY DAY  30 tablet  3  . lisinopril (PRINIVIL,ZESTRIL) 2.5 MG tablet Take 1 tablet (2.5 mg total) by mouth 2 (two) times daily.  60 tablet  3  . spironolactone (ALDACTONE) 25 MG tablet Take 75 mg by mouth daily.      [name] warfarin (COUMADIN) 3 MG tablet Take coumadin 1 1/2 tablet daily except 1 tablet on Mondays, Wednesdays and Fridays MANAGED BY LISA  45 tablet  3   No current facility-administered medications for this visit.    Past Medical History  Diagnosis Date  . Essential hypertension   . Myocardial infarction acute   . Ischemic cardiomyopathy     EF 20% by echo 2013  . Other pulmonary embolism and infarction     on coumadin  . Hyperlipidemia   . DOE (dyspnea on exertion)   . CAD (coronary artery  disease)   . CVA (cerebral infarction)   . Anxiety   . Tricuspid regurgitation     Severe  . Pulmonary hypertension     56 mm Hg    Past Surgical History  Procedure Laterality Date  . Coronary artery bypass graft  2003    4 vessel  . Cardiac defibrillator placement  11/09/06    SJM Epic II DR ICD implanted in [place], atrial lead no longer functions due to low impedance  . Implantable cardioverter defibrillator generator change  05/27/2012    Gen change.  SJM Assura VR.  Riata V lead  . Colonoscopy with esophagogastroduodenoscopy (egd)  06/06/2012    Procedure: COLONOSCOPY WITH ESOPHAGOGASTRODUODENOSCOPY (EGD);  Surgeon: Malissa Hippo, MD;  Location: [facility];  Service: Endoscopy;  Laterality: N/A;  310    Family History  Problem Relation Age of Onset  . Heart disease Mother     CAD  . Leukemia Father     History   Social History  . Marital Status: Married    Spouse Name: N/A    Number of Children: 4  . Years of Education: N/A   Occupational History  . Disabled.  Social History Main Topics  . Smoking status: Former Smoker    Quit date: 06/06/1995  . Smokeless tobacco: Never Used  . Alcohol Use: No     Comment: Quit in 1997. Drank on the weekends  . Drug Use: No  . Sexual Activity: Not on file   Other Topics Concern  . Not on file   Social History Narrative   Lives with wife and brother in [place] and grandson in [place].      Review of Systems:  All systems reviewed.  They are negative to the above problem except as previously stated.  Vital Signs: BP 111/70  Pulse 76  Ht 5\' 11"  (1.803 m)  Wt 199 lb (90.266 kg)  BMI 27.77 kg/m2  SpO2 96%  Physical Exam Patient is in NAD HEENT:  Normocephalic, atraumatic. EOMI, PERRLA.  Neck: Ext JVP is at jaw.  No bruits.  Lungs: clear to auscultation. No rales no wheezes.  Heart: Regular rate and rhythm. Normal S1, S2. No S3.  III/Vi systolic murmur PMI not displaced.  Abdomen:  Supple, nontender.  Normal bowel sounds. No masses. No hepatomegaly.  Extremities:   Good distal pulses throughout. Tr to 1+ lower extremity edema.  Musculoskeletal :moving all extremities.  Neuro:   alert and oriented x3.  CN II-XII grossly intact.  EKG  SR 79  RBBB.  LAFB   Assessment and Plan:  1.  Acute on chronic systolic CHF  Volume status is up some  I am not convinced it explains all of wt gain  No real change Need to confirm lasix. Dosing  Will check BMET, BNP, TSH, CBC and get back with patient on meds  2.  CAD  I am not convinced on unstable symptoms.    3.  HL  Will need to be reviewed  Not on statin.

## 2013-03-10 NOTE — Telephone Encounter (Addendum)
Patient c/o SOB, weight gain, and edema. Patient says in the past 3 weeks he thinks he has gained about 24 pounds. Patient states he has edema all over including face. No c/o chest pain, or dizziness. Patient given an appointment today at 1:00 pm at the [facility] office and informed if he get worse, to proceed to ED at [facility]. Patient and wife verbalized understanding of plan.

## 2013-03-10 NOTE — Patient Instructions (Addendum)
Your physician recommends that you schedule a follow-up appointment in: 1 week with Dr Antoine Poche  Your physician recommends that you return for lab work today. CBC, BMET, BNP, TSH

## 2013-03-11 LAB — BASIC METABOLIC PANEL
BUN: 12 mg/dL (ref 6–23)
CO2: 22 mEq/L (ref 19–32)
Calcium: 9 mg/dL (ref 8.4–10.5)
Chloride: 108 mEq/L (ref 96–112)
Creat: 1.28 mg/dL (ref 0.50–1.35)
Glucose, Bld: 97 mg/dL (ref 70–99)
Potassium: 4.6 mEq/L (ref 3.5–5.3)
Sodium: 137 mEq/L (ref 135–145)

## 2013-03-12 NOTE — Addendum Note (Signed)
Addended by: Derry Lory A on: 03/12/2013 04:28 PM   Modules accepted: Orders

## 2013-03-14 ENCOUNTER — Ambulatory Visit (INDEPENDENT_AMBULATORY_CARE_PROVIDER_SITE_OTHER): Payer: Medicare Other | Admitting: *Deleted

## 2013-03-14 DIAGNOSIS — I2699 Other pulmonary embolism without acute cor pulmonale: Secondary | ICD-10-CM

## 2013-03-14 DIAGNOSIS — Z7901 Long term (current) use of anticoagulants: Secondary | ICD-10-CM | POA: Diagnosis not present

## 2013-03-17 ENCOUNTER — Ambulatory Visit (INDEPENDENT_AMBULATORY_CARE_PROVIDER_SITE_OTHER): Payer: Medicare Other | Admitting: *Deleted

## 2013-03-17 DIAGNOSIS — I5022 Chronic systolic (congestive) heart failure: Secondary | ICD-10-CM

## 2013-03-17 DIAGNOSIS — Z95810 Presence of automatic (implantable) cardiac defibrillator: Secondary | ICD-10-CM | POA: Diagnosis not present

## 2013-03-17 DIAGNOSIS — I255 Ischemic cardiomyopathy: Secondary | ICD-10-CM

## 2013-03-17 DIAGNOSIS — I2589 Other forms of chronic ischemic heart disease: Secondary | ICD-10-CM

## 2013-03-18 ENCOUNTER — Encounter: Payer: Self-pay | Admitting: Cardiology

## 2013-03-18 ENCOUNTER — Ambulatory Visit (INDEPENDENT_AMBULATORY_CARE_PROVIDER_SITE_OTHER): Payer: Medicare Other | Admitting: Cardiology

## 2013-03-18 VITALS — BP 106/60 | HR 70 | Ht 71.0 in | Wt 184.4 lb

## 2013-03-18 DIAGNOSIS — R55 Syncope and collapse: Secondary | ICD-10-CM

## 2013-03-18 DIAGNOSIS — I5022 Chronic systolic (congestive) heart failure: Secondary | ICD-10-CM | POA: Diagnosis not present

## 2013-03-18 DIAGNOSIS — I10 Essential (primary) hypertension: Secondary | ICD-10-CM

## 2013-03-18 NOTE — Progress Notes (Signed)
HPI The patient is presenting as a new patient for followup of an ischemic cardiomyopathy. He has a history of distant bypass grafting done [place]. He's also had stenting in the past.  He has severe tricuspid regurgitation with an EF of about 20%.  He is status post ICD.   Since I last saw him he had a problem where he ran out of Lasix and his not realize it was not on his prescription list. He has had this filled. Since then and after seeing Dr. Tenny Craw he has felt much better.   He denies any chest pressure, neck or arm discomfort. His weight is almost down to baseline. Swelling that he was having has resolved. He's not having any presyncope or syncope.    No Known Allergies  Current Outpatient Prescriptions  Medication Sig Dispense Refill  . carvedilol (COREG) 6.25 MG tablet Take 1 tablet (6.25 mg total) by mouth 2 (two) times daily with a meal.  60 tablet  6  . DOCUSATE SODIUM PO Take 50 mg by mouth daily.      [name] FLUoxetine (PROZAC) 40 MG capsule Take 1 capsule (40 mg total) by mouth daily.  30 capsule  1  . folic acid (FOLVITE) 400 MCG tablet Take 400 mcg by mouth daily.      . furosemide (LASIX) 80 MG tablet Take 1&1/2 tablets by mouth in the morning and 1 tablet by mouth in the evening  75 tablet  3  . isosorbide mononitrate (IMDUR) 30 MG 24 hr tablet TAKE ONE TABLET BY MOUTH EVERY DAY  30 tablet  3  . lisinopril (PRINIVIL,ZESTRIL) 2.5 MG tablet Take 1 tablet (2.5 mg total) by mouth 2 (two) times daily.  60 tablet  3  . spironolactone (ALDACTONE) 25 MG tablet Take 75 mg by mouth daily.      [name] warfarin (COUMADIN) 3 MG tablet Take coumadin 1 1/2 tablet daily except 1 tablet on Mondays, Wednesdays and Fridays MANAGED BY LISA  45 tablet  3   No current facility-administered medications for this visit.    Past Medical History  Diagnosis Date  . Essential hypertension   . Myocardial infarction acute   . Ischemic cardiomyopathy     EF 20% by echo 2013  . Other pulmonary embolism and  infarction     on coumadin  . Hyperlipidemia   . DOE (dyspnea on exertion)   . CAD (coronary artery disease)   . CVA (cerebral infarction)   . Anxiety   . Tricuspid regurgitation     Severe  . Pulmonary hypertension     56 mm Hg    Past Surgical History  Procedure Date  . Coronary artery bypass graft 2003    4 vessel  . ICD     ROS:  As stated in the HPI and negative for all other systems.  PHYSICAL EXAM BP 106/60  Pulse 70  Ht 5\' 11"  (1.803 m)  Wt 184 lb 6.4 oz (83.643 kg)  BMI 25.73 kg/m2 GENERAL:  Well appearing HEENT:  Pupils equal round and reactive, fundi not visualized, oral mucosa unremarkable, dentures NECK:  Jugular venous distention to the jaw with a CV wave, waveform within normal limits, carotid upstroke brisk and symmetric, no bruits, no thyromegaly LYMPHATICS:  No cervical, inguinal adenopathy LUNGS:  Clear to auscultation bilaterally BACK:  No CVA tenderness CHEST:  Well healed ICD pocket, Well healed sternotomy scar. HEART:  PMI not displaced or sustained,S1 and S2 within normal limits, no S3, no  S4, no clicks, no rubs, 3/6 left mid sternal border holosystolic murmurs ABD:  Flat, positive bowel sounds normal in frequency in pitch, no bruits, no rebound, no guarding, no midline pulsatile mass, no hepatomegaly, no splenomegaly EXT:  2 plus pulses throughout, mild ankle edema, no cyanosis no clubbing, absent left radial  ASSESSMENT AND PLAN  Ischemic cardiomyopathy -  He seems to be euvolemic after he has had his Lasix restarted. Labs are checked recently and no change in therapy is indicated.  Pulmonary embolism and infarction -  At this point with a severely reduced ejection fraction and this history I have elected to continue his warfarin.   Essential hypertension -  His blood pressure is actually low but he tolerates this.  No change in therapy.   H/O atherosclerotic cardiovascular disease -  The patient has no new sypmtoms.  No further  cardiovascular testing is indicated.  We will continue with aggressive risk reduction and meds as listed.  ICD (implantable cardiac defibrillator) battery depletion -  The patient is up to date with follow up.  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

## 2013-03-18 NOTE — Patient Instructions (Signed)
The current medical regimen is effective;  continue present plan and medications.  Follow up in 3 months with Dr Hochrein.  You will receive a letter in the mail 2 months before you are due.  Please call us when you receive this letter to schedule your follow up appointment.   

## 2013-03-19 LAB — REMOTE ICD DEVICE
BRDY-0002RV: 40 {beats}/min
DEV-0020ICD: NEGATIVE
DEVICE MODEL ICD: 7025822
HV IMPEDENCE: 37 Ohm
TZON-0003SLOWVT: 340 ms
TZON-0004SLOWVT: 40
TZON-0010SLOWVT: 40 ms
VENTRICULAR PACING ICD: 1 pct

## 2013-03-26 ENCOUNTER — Encounter: Payer: Self-pay | Admitting: Internal Medicine

## 2013-04-04 ENCOUNTER — Ambulatory Visit (INDEPENDENT_AMBULATORY_CARE_PROVIDER_SITE_OTHER): Payer: Medicare Other | Admitting: *Deleted

## 2013-04-04 DIAGNOSIS — I2699 Other pulmonary embolism without acute cor pulmonale: Secondary | ICD-10-CM

## 2013-04-04 DIAGNOSIS — Z7901 Long term (current) use of anticoagulants: Secondary | ICD-10-CM

## 2013-04-14 ENCOUNTER — Encounter: Payer: Self-pay | Admitting: Internal Medicine

## 2013-05-06 ENCOUNTER — Ambulatory Visit (INDEPENDENT_AMBULATORY_CARE_PROVIDER_SITE_OTHER): Payer: Medicare Other | Admitting: *Deleted

## 2013-05-06 DIAGNOSIS — Z7901 Long term (current) use of anticoagulants: Secondary | ICD-10-CM

## 2013-05-06 DIAGNOSIS — I2699 Other pulmonary embolism without acute cor pulmonale: Secondary | ICD-10-CM

## 2013-05-06 LAB — POCT INR: INR: 1.8

## 2013-05-08 ENCOUNTER — Other Ambulatory Visit: Payer: Self-pay | Admitting: Cardiology

## 2013-05-09 DIAGNOSIS — E785 Hyperlipidemia, unspecified: Secondary | ICD-10-CM | POA: Diagnosis not present

## 2013-05-09 DIAGNOSIS — I428 Other cardiomyopathies: Secondary | ICD-10-CM | POA: Diagnosis not present

## 2013-05-09 DIAGNOSIS — Z23 Encounter for immunization: Secondary | ICD-10-CM | POA: Diagnosis not present

## 2013-05-09 DIAGNOSIS — I10 Essential (primary) hypertension: Secondary | ICD-10-CM | POA: Diagnosis not present

## 2013-05-09 DIAGNOSIS — I5022 Chronic systolic (congestive) heart failure: Secondary | ICD-10-CM | POA: Diagnosis not present

## 2013-05-12 DIAGNOSIS — E785 Hyperlipidemia, unspecified: Secondary | ICD-10-CM | POA: Diagnosis not present

## 2013-05-13 ENCOUNTER — Other Ambulatory Visit: Payer: Self-pay | Admitting: Cardiology

## 2013-05-13 MED ORDER — WARFARIN SODIUM 3 MG PO TABS: ORAL_TABLET | ORAL | Status: DC

## 2013-05-15 DIAGNOSIS — E119 Type 2 diabetes mellitus without complications: Secondary | ICD-10-CM | POA: Diagnosis not present

## 2013-05-21 ENCOUNTER — Ambulatory Visit (INDEPENDENT_AMBULATORY_CARE_PROVIDER_SITE_OTHER): Payer: Medicare Other | Admitting: Cardiology

## 2013-05-21 ENCOUNTER — Encounter: Payer: Self-pay | Admitting: Cardiology

## 2013-05-21 VITALS — BP 107/63 | HR 65 | Ht 71.0 in | Wt 195.0 lb

## 2013-05-21 DIAGNOSIS — I2589 Other forms of chronic ischemic heart disease: Secondary | ICD-10-CM | POA: Diagnosis not present

## 2013-05-21 DIAGNOSIS — I255 Ischemic cardiomyopathy: Secondary | ICD-10-CM

## 2013-05-21 MED ORDER — LISINOPRIL 5 MG PO TABS: ORAL_TABLET | ORAL | Status: DC

## 2013-05-21 NOTE — Progress Notes (Signed)
HPI The patient is presenting for followup of an ischemic cardiomyopathy. He has a history of distant bypass grafting done [place]. He's also had stenting in the past.  He has severe tricuspid regurgitation with an EF of about 20%.  He is status post ICD.   Since I last saw him he has gained a few pounds but he says it's because he's been eating more. He's not reporting any swelling. He says some days he might be a little more dyspneic but he is vague about this. He's not describing any PND or orthopnea. He is not describing any palpitations, presyncope or syncope. He denies any chest pressure, neck or arm discomfort. I think he's been compliant with his medications although his Coumadin level recently was a little low  No Known Allergies  Current Outpatient Prescriptions  Medication Sig Dispense Refill  . carvedilol (COREG) 6.25 MG tablet Take 1 tablet (6.25 mg total) by mouth 2 (two) times daily with a meal.  60 tablet  6  . DOCUSATE SODIUM PO Take 50 mg by mouth daily.      [name] FLUoxetine (PROZAC) 40 MG capsule Take 1 capsule (40 mg total) by mouth daily.  30 capsule  1  . folic acid (FOLVITE) 400 MCG tablet Take 400 mcg by mouth daily.      . furosemide (LASIX) 80 MG tablet Take 1&1/2 tablets by mouth in the morning and 1 tablet by mouth in the evening  75 tablet  3  . isosorbide mononitrate (IMDUR) 30 MG 24 hr tablet TAKE ONE TABLET BY MOUTH EVERY DAY  30 tablet  3  . lisinopril (PRINIVIL,ZESTRIL) 2.5 MG tablet TAKE ONE TABLET BY MOUTH TWICE DAILY  60 tablet  0  . spironolactone (ALDACTONE) 25 MG tablet Take 75 mg by mouth daily.      [name] warfarin (COUMADIN) 3 MG tablet Take coumadin 1 1/2 tablet daily except 1 tablet on Mondays, Wednesdays and Fridays MANAGED BY LISA  45 tablet  3   No current facility-administered medications for this visit.    Past Medical History  Diagnosis Date  . Essential hypertension   . Myocardial infarction acute   . Ischemic cardiomyopathy     EF 20% by  echo 2013  . Other pulmonary embolism and infarction     on coumadin  . Hyperlipidemia   . DOE (dyspnea on exertion)   . CAD (coronary artery disease)   . CVA (cerebral infarction)   . Anxiety   . Tricuspid regurgitation     Severe  . Pulmonary hypertension     56 mm Hg    Past Surgical History  Procedure Date  . Coronary artery bypass graft 2003    4 vessel  . ICD     ROS:  As stated in the HPI and negative for all other systems.  PHYSICAL EXAM BP 107/63  Pulse 65  Ht 5\' 11"  (1.803 m)  Wt 195 lb (88.451 kg)  BMI 27.21 kg/m2 GENERAL:  Well appearing HEENT:  Pupils equal round and reactive, fundi not visualized, oral mucosa unremarkable, dentures NECK:  8 cm jugular venous distention to the jaw with a CV wave, waveform within normal limits, carotid upstroke brisk and symmetric, no bruits, no thyromegaly LYMPHATICS:  No cervical, inguinal adenopathy LUNGS:  Clear to auscultation bilaterally BACK:  No CVA tenderness CHEST:  Well healed ICD pocket, Well healed sternotomy scar. HEART:  PMI not displaced or sustained,S1 and S2 within normal limits, no S3, no S4, no  clicks, no rubs, 3/6 left mid sternal border holosystolic murmurs ABD:  Flat, positive bowel sounds normal in frequency in pitch, no bruits, no rebound, no guarding, no midline pulsatile mass, no hepatomegaly, no splenomegaly EXT:  2 plus pulses throughout, mild ankle edema, no cyanosis no clubbing, absent left radial  ASSESSMENT AND PLAN  Ischemic cardiomyopathy -  He seems to be euvolemic at this point. I will however try to titrate his lisinopril to 5 mg twice a day and he will get a basic metabolic profile in one week.  Pulmonary embolism and infarction -  At this point with a severely reduced ejection fraction and this history I have elected to continue his warfarin.   Essential hypertension -  His blood pressure is actually low but he tolerates this.    I will be trying to titrate the meds as above.  H/O  atherosclerotic cardiovascular disease -  The patient has no new sypmtoms.  No further cardiovascular testing is indicated.  We will continue with aggressive risk reduction and meds as listed.  ICD (implantable cardiac defibrillator) battery depletion -  The patient is up to date with follow up.

## 2013-05-21 NOTE — Patient Instructions (Signed)
Please increase you Lisinopril to 5 mg one twice a day. Continue all other medications as listed  Please have blood work in one week (BMP)  Follow up in 6 months with Dr Antoine Poche.  You will receive a letter in the mail 2 months before you are due.  Please call us when you receive this letter to schedule your follow up appointment.

## 2013-05-27 ENCOUNTER — Encounter: Payer: Self-pay | Admitting: Cardiology

## 2013-05-27 ENCOUNTER — Ambulatory Visit (INDEPENDENT_AMBULATORY_CARE_PROVIDER_SITE_OTHER): Payer: Medicare Other | Admitting: Pharmacist

## 2013-05-27 DIAGNOSIS — I509 Heart failure, unspecified: Secondary | ICD-10-CM | POA: Diagnosis not present

## 2013-05-27 DIAGNOSIS — I2699 Other pulmonary embolism without acute cor pulmonale: Secondary | ICD-10-CM

## 2013-05-27 DIAGNOSIS — Z7901 Long term (current) use of anticoagulants: Secondary | ICD-10-CM

## 2013-05-27 DIAGNOSIS — R0602 Shortness of breath: Secondary | ICD-10-CM | POA: Diagnosis not present

## 2013-05-27 LAB — POCT INR: INR: 2.2

## 2013-06-09 ENCOUNTER — Encounter: Payer: Self-pay | Admitting: Internal Medicine

## 2013-06-09 ENCOUNTER — Ambulatory Visit (INDEPENDENT_AMBULATORY_CARE_PROVIDER_SITE_OTHER): Payer: Medicare Other | Admitting: Internal Medicine

## 2013-06-09 VITALS — BP 99/62 | HR 70 | Ht 71.0 in | Wt 189.1 lb

## 2013-06-09 DIAGNOSIS — I2589 Other forms of chronic ischemic heart disease: Secondary | ICD-10-CM | POA: Diagnosis not present

## 2013-06-09 DIAGNOSIS — I428 Other cardiomyopathies: Secondary | ICD-10-CM | POA: Diagnosis not present

## 2013-06-09 DIAGNOSIS — R55 Syncope and collapse: Secondary | ICD-10-CM

## 2013-06-09 DIAGNOSIS — I255 Ischemic cardiomyopathy: Secondary | ICD-10-CM

## 2013-06-09 DIAGNOSIS — I5022 Chronic systolic (congestive) heart failure: Secondary | ICD-10-CM

## 2013-06-09 LAB — MDC_IDC_ENUM_SESS_TYPE_INCLINIC
Battery Remaining Longevity: 94.8 mo
HighPow Impedance: 40.4531
Implantable Pulse Generator Serial Number: 7025822
Lead Channel Impedance Value: 675 Ohm
Lead Channel Pacing Threshold Amplitude: 0.75 V
Lead Channel Sensing Intrinsic Amplitude: 11.9 mV
Lead Channel Setting Pacing Amplitude: 2.5 V
Lead Channel Setting Pacing Pulse Width: 0.5 ms
Lead Channel Setting Sensing Sensitivity: 0.5 mV
MDC IDC MSMT LEADCHNL RV PACING THRESHOLD PULSEWIDTH: 0.5 ms
MDC IDC SESS DTM: 20150105095548
MDC IDC SET ZONE DETECTION INTERVAL: 340 ms
MDC IDC STAT BRADY RV PERCENT PACED: 0.04 %
Zone Setting Detection Interval: 270 ms

## 2013-06-09 NOTE — Progress Notes (Signed)
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 male who presents today for routine electrophysiology followup.  Since his last visit to our office, the patient reports doing very well.  Today, he denies symptoms of palpitations, chest pain, shortness of breath,  lower extremity edema, dizziness, presyncope, syncope, or ICD shocks.  The patient is otherwise without complaint today.   Past Medical History  Diagnosis Date  . Essential hypertension   . Myocardial infarction acute   . Ischemic cardiomyopathy     EF 20% by echo 2013  . Other pulmonary embolism and infarction     on coumadin  . Hyperlipidemia   . DOE (dyspnea on exertion)   . CAD (coronary artery disease)   . CVA (cerebral infarction)   . Anxiety   . Tricuspid regurgitation     Severe  . Pulmonary hypertension     56 mm Hg   Past Surgical History  Procedure Laterality Date  . Coronary artery bypass graft  2003    4 vessel  . Cardiac defibrillator placement  11/09/06    SJM Epic II DR ICD implanted in [place], atrial lead no longer functions due to low impedance  . Implantable cardioverter defibrillator generator change  05/27/2012    Gen change.  SJM Assura VR.  Riata V lead  . Colonoscopy with esophagogastroduodenoscopy (egd)  06/06/2012    Procedure: COLONOSCOPY WITH ESOPHAGOGASTRODUODENOSCOPY (EGD);  Surgeon: Rogene Houston, MD;  Location: [facility];  Service: Endoscopy;  Laterality: N/A;  310    Current Outpatient Prescriptions  Medication Sig Dispense Refill  . carvedilol (COREG) 6.25 MG tablet Take 1 tablet (6.25 mg total) by mouth 2 (two) times daily with a meal.  60 tablet  6  . FLUoxetine (PROZAC) 40 MG capsule Take 1 capsule (40 mg total) by mouth daily.  30 capsule  1  . furosemide (LASIX) 80 MG tablet Take 1&1/2 tablets by mouth in the morning and 1 tablet by mouth in the evening  75 tablet  3  . isosorbide mononitrate (IMDUR) 30 MG 24 hr tablet TAKE ONE  TABLET BY MOUTH EVERY DAY  30 tablet  3  . lisinopril (PRINIVIL,ZESTRIL) 5 MG tablet TAKE ONE TABLET BY MOUTH TWICE DAILY  180 tablet  3  . spironolactone (ALDACTONE) 25 MG tablet Take 75 mg by mouth daily.      [name] warfarin (COUMADIN) 3 MG tablet Take coumadin 1 1/2 tablet daily except 1 tablet on Mondays, Wednesdays and Fridays MANAGED BY LISA  45 tablet  3   No current facility-administered medications for this visit.    Physical Exam: Filed Vitals:   06/09/13 0916  BP: 99/62  Pulse: 70  Height: 5\' 11"  (1.803 m)  Weight: 189 lb 1.9 oz (85.784 kg)  SpO2: 99%    GEN- The patient is well appearing, alert and oriented x 3 today.   Head- normocephalic, atraumatic Eyes-  Sclera clear, conjunctiva pink Ears- hearing intact Oropharynx- clear Lungs- Clear to ausculation bilaterally, normal work of breathing Chest- ICD pocket is well healed Heart- Regular rate and rhythm,3/6 SEM LLSB GI- soft, NT, ND, + BS Extremities- no clubbing, cyanosis, or edema  ICD interrogation- reviewed in detail today,  See PACEART report  Assessment and Plan:  1.  Chronic systolic dysfunction/ Severe valvular heart disease euvolemic today Stable on an appropriate medical regimen Normal ICD function See Pace Art report No changes today Coreview was discussed with the  patient today. We will enroll in the ICM device clinic for Coreview follow-up  2. 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

## 2013-06-09 NOTE — Patient Instructions (Signed)
Continue all current medications. Merlin remote check 09/10/2013 Your physician wants you to follow up in:  1 year.  You will receive a reminder letter in the mail one-two months in advance.  If you don't receive a letter, please call our office to schedule the follow up appointment - Dr. Rayann Heman

## 2013-06-16 ENCOUNTER — Other Ambulatory Visit (INDEPENDENT_AMBULATORY_CARE_PROVIDER_SITE_OTHER): Payer: Self-pay | Admitting: Internal Medicine

## 2013-06-16 ENCOUNTER — Ambulatory Visit (INDEPENDENT_AMBULATORY_CARE_PROVIDER_SITE_OTHER): Payer: Medicare Other | Admitting: Internal Medicine

## 2013-06-16 ENCOUNTER — Encounter (INDEPENDENT_AMBULATORY_CARE_PROVIDER_SITE_OTHER): Payer: Self-pay | Admitting: Internal Medicine

## 2013-06-16 VITALS — BP 100/60 | HR 70 | Temp 97.7°F | Resp 16 | Ht 71.0 in | Wt 190.0 lb

## 2013-06-16 DIAGNOSIS — K761 Chronic passive congestion of liver: Secondary | ICD-10-CM | POA: Diagnosis not present

## 2013-06-16 DIAGNOSIS — R188 Other ascites: Secondary | ICD-10-CM | POA: Diagnosis not present

## 2013-06-16 DIAGNOSIS — R55 Syncope and collapse: Secondary | ICD-10-CM | POA: Diagnosis not present

## 2013-06-16 DIAGNOSIS — D649 Anemia, unspecified: Secondary | ICD-10-CM

## 2013-06-16 DIAGNOSIS — R0602 Shortness of breath: Secondary | ICD-10-CM | POA: Diagnosis not present

## 2013-06-16 DIAGNOSIS — R17 Unspecified jaundice: Secondary | ICD-10-CM | POA: Diagnosis not present

## 2013-06-16 DIAGNOSIS — R0609 Other forms of dyspnea: Secondary | ICD-10-CM | POA: Diagnosis not present

## 2013-06-16 DIAGNOSIS — I10 Essential (primary) hypertension: Secondary | ICD-10-CM | POA: Diagnosis not present

## 2013-06-16 LAB — HEPATIC FUNCTION PANEL
ALT: 12 U/L (ref 0–53)
AST: 16 U/L (ref 0–37)
Albumin: 3.9 g/dL (ref 3.5–5.2)
Alkaline Phosphatase: 67 U/L (ref 39–117)
BILIRUBIN DIRECT: 0.2 mg/dL (ref 0.0–0.3)
Indirect Bilirubin: 0.7 mg/dL (ref 0.0–0.9)
Total Bilirubin: 0.9 mg/dL (ref 0.3–1.2)
Total Protein: 7.6 g/dL (ref 6.0–8.3)

## 2013-06-16 LAB — CBC
HCT: 35.4 % — ABNORMAL LOW (ref 39.0–52.0)
HEMOGLOBIN: 11.4 g/dL — AB (ref 13.0–17.0)
MCH: 29.4 pg (ref 26.0–34.0)
MCHC: 32.2 g/dL (ref 30.0–36.0)
MCV: 91.2 fL (ref 78.0–100.0)
PLATELETS: 231 10*3/uL (ref 150–400)
RBC: 3.88 MIL/uL — AB (ref 4.22–5.81)
RDW: 14.1 % (ref 11.5–15.5)
WBC: 6.3 10*3/uL (ref 4.0–10.5)

## 2013-06-16 NOTE — Patient Instructions (Signed)
Physician will contact you with results of blood work. 

## 2013-06-16 NOTE — Progress Notes (Signed)
Presenting complaint;  Followup for ascites and jaundice.  Database;  Patient is 68 year old African male who was initially evaluated in November 2013 for ascites and jaundice. On initial presentation his serum albumin was low. 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 He has chronic systolic heart failure/ischemic cardiomyopathy and he has AICD. His bilirubin on 12/11/2012 was 1.8. Bilirubin was 5.6 on 04/19/2012. He underwent EGD and colonoscopy on 06/06/2012. EGD revealed mild portal gastropathy but no evidence of varices. Colonoscopy revealed external hemorrhoids and small rectal lipoma.   Subjective:  Patient presents for scheduled visit accompanied by his wife. He has no complaints. He denies exertional dyspnea when he walks on a flat surface. He has very good appetite. He remains on low salt diet. He denies melena or rectal bleeding. His bowels move daily. He has gained 17 pounds since his last visit but he denies abdominal distention. He has noted decrease in lower extremity edema. He was seen by Dr. Thompson Grayer of cardiology last week and according to his note he appears to be stable from cardiac standpoint.   Current Medications: Current Outpatient Prescriptions  Medication Sig Dispense Refill  . carvedilol (COREG) 6.25 MG tablet Take 1 tablet (6.25 mg total) by mouth 2 (two) times daily with a meal.  60 tablet  6  . FLUoxetine (PROZAC) 40 MG capsule Take 1 capsule (40 mg total) by mouth daily.  30 capsule  1  . furosemide (LASIX) 80 MG tablet Take 1&1/2 tablets by mouth in the morning and 1 tablet by mouth in the evening  75 tablet  3  . isosorbide mononitrate (IMDUR) 30 MG 24 hr tablet TAKE ONE TABLET BY MOUTH EVERY DAY  30 tablet  3  . lisinopril (PRINIVIL,ZESTRIL) 5 MG tablet TAKE ONE TABLET BY MOUTH TWICE DAILY  180 tablet  3  . senna-docusate (SENOKOT-S) 8.6-50 MG per tablet Take 1 tablet by  mouth daily.      [name] spironolactone (ALDACTONE) 25 MG tablet Take 75 mg by mouth daily.      [name] warfarin (COUMADIN) 3 MG tablet Take coumadin 1 1/2 tablet daily except 1 tablet on Mondays, Wednesdays and Fridays MANAGED BY LISA  45 tablet  3   No current facility-administered medications for this visit.     Objective: Blood pressure 100/60, pulse 70, temperature 97.7 F (36.5 C), temperature source Oral, resp. rate 16, height 5\' 11"  (1.803 m), weight 190 lb (86.183 kg). Patient is alert and in no acute distress. Conjunctiva is pink. Sclera is nonicteric Oropharyngeal mucosa is normal. No neck masses or thyromegaly noted. Cardiac exam with regular rhythm normal S1 and S2. Grade 2/6 systolic ejection murmur at LLSB. Lungs are clear to auscultation. Abdomen symmetrical. Shifting dullness is absent. Abdomen is soft and nontender without organomegaly or masses. No LE edema or clubbing noted.  Labs/studies Results: Metabolic 7 and 37/62/8315. Serum sodium 133, potassium 3.9, chloride 101, CO2 29, BUN 24, creatinine 1.21 and glucose 110.   Assessment:  #1. 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 Clinically he does not have ascites even though he has gained 17 pounds in the last 6 months. Suspect hepatic function has improved in parallel with improved cardiac function. Similarly he is not jaundiced today. #2. He is average risk for CRC and up-to-date. Last colonoscopy was in January 2014. #3. History of anemia. H&H on 03/10/2013 was 10.4 and 32.6.  Plan:  Patient will go to the lab for CBC and LFTs. Will freeze serum in case further studies are needed regarding anemia. Office visit in one year.

## 2013-06-17 LAB — IRON AND TIBC
%SAT: 26 % (ref 20–55)
IRON: 102 ug/dL (ref 42–165)
TIBC: 399 ug/dL (ref 215–435)
UIBC: 297 ug/dL (ref 125–400)

## 2013-06-17 LAB — FOLATE: Folate: 12.1 ng/mL

## 2013-06-17 LAB — VITAMIN B12: VITAMIN B 12: 469 pg/mL (ref 211–911)

## 2013-06-17 LAB — FERRITIN: Ferritin: 117 ng/mL (ref 22–322)

## 2013-06-17 NOTE — Progress Notes (Signed)
Quick Note:  I called [facility] and spoke to [place] and these labs have been added. ______

## 2013-06-24 ENCOUNTER — Ambulatory Visit (INDEPENDENT_AMBULATORY_CARE_PROVIDER_SITE_OTHER): Payer: Medicare Other | Admitting: *Deleted

## 2013-06-24 DIAGNOSIS — Z7901 Long term (current) use of anticoagulants: Secondary | ICD-10-CM

## 2013-06-24 DIAGNOSIS — I2699 Other pulmonary embolism without acute cor pulmonale: Secondary | ICD-10-CM | POA: Diagnosis not present

## 2013-06-24 DIAGNOSIS — Z5181 Encounter for therapeutic drug level monitoring: Secondary | ICD-10-CM

## 2013-06-24 LAB — POCT INR: INR: 2.3

## 2013-07-03 ENCOUNTER — Telehealth: Payer: Self-pay | Admitting: Internal Medicine

## 2013-07-03 NOTE — Telephone Encounter (Signed)
Recall in for 3 month office visit in [place] and notation made in [facility] that patient refuses Merlin.

## 2013-07-03 NOTE — Telephone Encounter (Signed)
New problem   Pt stated he doesn't want to appt for heart monitor that he had spoken to [place] about.

## 2013-07-09 ENCOUNTER — Telehealth: Payer: Self-pay | Admitting: Internal Medicine

## 2013-07-10 NOTE — Telephone Encounter (Signed)
Spoke w/wife to let know transmission should automatically send so pt does not need to do anything.

## 2013-07-10 NOTE — Telephone Encounter (Signed)
Patient is calling back to see if he needs to go to [address].

## 2013-07-14 ENCOUNTER — Other Ambulatory Visit: Payer: Self-pay | Admitting: *Deleted

## 2013-07-14 ENCOUNTER — Ambulatory Visit (INDEPENDENT_AMBULATORY_CARE_PROVIDER_SITE_OTHER): Payer: Medicare Other | Admitting: *Deleted

## 2013-07-14 DIAGNOSIS — I255 Ischemic cardiomyopathy: Secondary | ICD-10-CM

## 2013-07-14 DIAGNOSIS — Z95810 Presence of automatic (implantable) cardiac defibrillator: Secondary | ICD-10-CM

## 2013-07-14 DIAGNOSIS — I5022 Chronic systolic (congestive) heart failure: Secondary | ICD-10-CM

## 2013-07-14 NOTE — Progress Notes (Signed)
EPIC Encounter for ICM Monitoring  Patient Name: Rick [name] is a 68 y.o. male Date: 07/14/2013 Primary Care Physican: Delphina Cahill, MD Primary Cardiologist: Hochrein Electrophysiologist: Allred Dry Weight: 184 lbs       In the past month, have you:  1. Gained more than 2 pounds in a day or more than 5 pounds in a week? no  2. Had changes in your medications (with verification of current medications)? no  3. Had more shortness of breath than is usual for you? no  4. Limited your activity because of shortness of breath? no  5. Not been able to sleep because of shortness of breath? no  6. Had increased swelling in your feet or ankles? no  7. Had symptoms of dehydration (dizziness, dry mouth, increased thirst, decreased urine output) no  8. Had changes in sodium restriction? no  9. Been compliant with medication? Yes   ** The patient reports overall that he feels well, but does report to pre-syncopal spells in the last 2 weeks where he had been standing, but then had to sit or lay down. Once he got to a resting position, he reports feeling like he was blacking out and became very diaphoretic. He states he did not pass out completely. Per the patient's wife's report, he did not start to have any episodes like this until his lisinopril was increased to 5 mg BID due to his cardiomyopathy. The patient's wife did take his blood pressure during his episodes and his BP was 70/46 & 70/44. They do not check his BP on a daily basis. I have instructed them that I would discuss with Dr. Percival Spanish and call them back. They do not check his BP daily at home. **  ICM trend:     Follow-up plan: ICM clinic phone appointment: 08/14/13.  ** Discussed the patient's pre-syncopal spells with Dr. Percival Spanish, orders received to decrease lisinopril back to 2.5 mg twice daily. I have advised that they track the patient's BP's for the next several days to see how he is doing on the lower dose. The patient's wife has  been notified to call the office back with any questions or concerns. She voices understanding.**   Copy of note sent to patient's primary care physician, primary cardiologist, and device following physician.  Alvis Lemmings, RN, BSN 07/14/2013 9:26 AM

## 2013-08-05 ENCOUNTER — Ambulatory Visit (INDEPENDENT_AMBULATORY_CARE_PROVIDER_SITE_OTHER): Payer: Medicare Other | Admitting: *Deleted

## 2013-08-05 ENCOUNTER — Other Ambulatory Visit: Payer: Self-pay | Admitting: Internal Medicine

## 2013-08-05 DIAGNOSIS — Z5181 Encounter for therapeutic drug level monitoring: Secondary | ICD-10-CM | POA: Insufficient documentation

## 2013-08-05 DIAGNOSIS — I2699 Other pulmonary embolism without acute cor pulmonale: Secondary | ICD-10-CM | POA: Diagnosis not present

## 2013-08-05 DIAGNOSIS — Z7901 Long term (current) use of anticoagulants: Secondary | ICD-10-CM | POA: Diagnosis not present

## 2013-08-05 LAB — POCT INR: INR: 1.7

## 2013-08-14 ENCOUNTER — Encounter: Payer: Medicare Other | Admitting: *Deleted

## 2013-08-14 ENCOUNTER — Other Ambulatory Visit: Payer: Self-pay | Admitting: *Deleted

## 2013-08-14 DIAGNOSIS — I5022 Chronic systolic (congestive) heart failure: Secondary | ICD-10-CM

## 2013-08-14 DIAGNOSIS — R079 Chest pain, unspecified: Secondary | ICD-10-CM

## 2013-08-14 DIAGNOSIS — I10 Essential (primary) hypertension: Secondary | ICD-10-CM

## 2013-08-14 DIAGNOSIS — R0602 Shortness of breath: Secondary | ICD-10-CM

## 2013-08-14 MED ORDER — FUROSEMIDE 80 MG PO TABS: ORAL_TABLET | ORAL | Status: DC

## 2013-08-18 ENCOUNTER — Other Ambulatory Visit: Payer: Self-pay | Admitting: *Deleted

## 2013-08-18 MED ORDER — WARFARIN SODIUM 3 MG PO TABS: ORAL_TABLET | ORAL | Status: DC

## 2013-08-22 ENCOUNTER — Other Ambulatory Visit: Payer: Self-pay | Admitting: Internal Medicine

## 2013-08-25 ENCOUNTER — Other Ambulatory Visit: Payer: Self-pay | Admitting: Internal Medicine

## 2013-08-26 ENCOUNTER — Ambulatory Visit (INDEPENDENT_AMBULATORY_CARE_PROVIDER_SITE_OTHER): Payer: Medicare Other | Admitting: *Deleted

## 2013-08-26 DIAGNOSIS — I2699 Other pulmonary embolism without acute cor pulmonale: Secondary | ICD-10-CM

## 2013-08-26 DIAGNOSIS — Z5181 Encounter for therapeutic drug level monitoring: Secondary | ICD-10-CM | POA: Diagnosis not present

## 2013-08-26 DIAGNOSIS — Z7901 Long term (current) use of anticoagulants: Secondary | ICD-10-CM

## 2013-08-26 LAB — POCT INR: INR: 1.9

## 2013-09-01 NOTE — Telephone Encounter (Signed)
Dr Harrington Challenger do you want to refill prozac or send to [facility], MD in [place]

## 2013-09-02 NOTE — Telephone Encounter (Signed)
Please send to PCP

## 2013-09-02 NOTE — Telephone Encounter (Signed)
Not sure what I am supposed to do here.

## 2013-09-03 NOTE — Telephone Encounter (Signed)
Send to primary MD Wende Neighbors

## 2013-09-04 NOTE — Telephone Encounter (Signed)
[facility] in [place] to send script to PCP Wende Neighbors for [facility]

## 2013-09-09 ENCOUNTER — Encounter: Payer: Self-pay | Admitting: *Deleted

## 2013-09-16 ENCOUNTER — Ambulatory Visit (INDEPENDENT_AMBULATORY_CARE_PROVIDER_SITE_OTHER): Payer: Medicare Other | Admitting: *Deleted

## 2013-09-16 DIAGNOSIS — I2699 Other pulmonary embolism without acute cor pulmonale: Secondary | ICD-10-CM | POA: Diagnosis not present

## 2013-09-16 DIAGNOSIS — Z5181 Encounter for therapeutic drug level monitoring: Secondary | ICD-10-CM | POA: Diagnosis not present

## 2013-09-16 DIAGNOSIS — Z7901 Long term (current) use of anticoagulants: Secondary | ICD-10-CM | POA: Diagnosis not present

## 2013-09-16 LAB — POCT INR: INR: 2.1

## 2013-10-14 ENCOUNTER — Ambulatory Visit (INDEPENDENT_AMBULATORY_CARE_PROVIDER_SITE_OTHER): Payer: Medicare Other | Admitting: *Deleted

## 2013-10-14 DIAGNOSIS — Z5181 Encounter for therapeutic drug level monitoring: Secondary | ICD-10-CM | POA: Diagnosis not present

## 2013-10-14 DIAGNOSIS — I2699 Other pulmonary embolism without acute cor pulmonale: Secondary | ICD-10-CM

## 2013-10-14 DIAGNOSIS — Z7901 Long term (current) use of anticoagulants: Secondary | ICD-10-CM | POA: Diagnosis not present

## 2013-10-14 LAB — POCT INR: INR: 1.9

## 2013-10-17 ENCOUNTER — Ambulatory Visit (INDEPENDENT_AMBULATORY_CARE_PROVIDER_SITE_OTHER): Payer: Medicare Other | Admitting: *Deleted

## 2013-10-17 DIAGNOSIS — I255 Ischemic cardiomyopathy: Secondary | ICD-10-CM

## 2013-10-17 DIAGNOSIS — I2589 Other forms of chronic ischemic heart disease: Secondary | ICD-10-CM | POA: Diagnosis not present

## 2013-10-17 DIAGNOSIS — I5022 Chronic systolic (congestive) heart failure: Secondary | ICD-10-CM

## 2013-10-17 LAB — MDC_IDC_ENUM_SESS_TYPE_INCLINIC
Battery Remaining Longevity: 91.2 mo
HighPow Impedance: 39.2493
Implantable Pulse Generator Serial Number: 7025822
Lead Channel Impedance Value: 600 Ohm
Lead Channel Pacing Threshold Pulse Width: 0.5 ms
Lead Channel Sensing Intrinsic Amplitude: 12 mV
Lead Channel Setting Pacing Amplitude: 2.5 V
Lead Channel Setting Pacing Pulse Width: 0.5 ms
Lead Channel Setting Sensing Sensitivity: 0.5 mV
MDC IDC MSMT LEADCHNL RV PACING THRESHOLD AMPLITUDE: 0.5 V
MDC IDC SESS DTM: 20150515140636
MDC IDC SET ZONE DETECTION INTERVAL: 340 ms
MDC IDC STAT BRADY RV PERCENT PACED: 0.09 %
Zone Setting Detection Interval: 270 ms

## 2013-10-17 NOTE — Progress Notes (Signed)
ICD check in clinic. Battery longevity 7.6 years. Normal device function. No ventricular high rate episodes. Merlin 01-19-14 and ROV in January with [facility].

## 2013-10-23 ENCOUNTER — Encounter: Payer: Self-pay | Admitting: Internal Medicine

## 2013-11-07 ENCOUNTER — Ambulatory Visit (INDEPENDENT_AMBULATORY_CARE_PROVIDER_SITE_OTHER): Payer: Medicare Other | Admitting: *Deleted

## 2013-11-07 DIAGNOSIS — I2699 Other pulmonary embolism without acute cor pulmonale: Secondary | ICD-10-CM | POA: Diagnosis not present

## 2013-11-07 DIAGNOSIS — Z5181 Encounter for therapeutic drug level monitoring: Secondary | ICD-10-CM

## 2013-11-07 DIAGNOSIS — Z7901 Long term (current) use of anticoagulants: Secondary | ICD-10-CM | POA: Diagnosis not present

## 2013-11-07 LAB — POCT INR: INR: 2.8

## 2013-11-25 ENCOUNTER — Telehealth: Payer: Self-pay | Admitting: Cardiology

## 2013-11-25 ENCOUNTER — Encounter: Payer: Self-pay | Admitting: *Deleted

## 2013-11-25 NOTE — Telephone Encounter (Signed)
Spoke with patient he he took his BP and HR while on the phone with nurse BP 81/69  &  HR 65. Patient c/o dizziness and pain at the top of his head when he stands up. No chest pain or sob. Patient has already taken his morning doses of all his medications.  Patient is scheduled to see MD on 12/09/13. Please advise.

## 2013-11-25 NOTE — Telephone Encounter (Signed)
Patient is faint and dizzy.  States that his pressure is 80/54 would like to know what he needs to do  Patient is on 101

## 2013-11-25 NOTE — Telephone Encounter (Signed)
His baseline blood pressures from Dr Cherlyn Cushing last notes show baseline 100/60. Have him stop his lisinopril, coreg, spironolactone, lasix for rest of the day and come see me tomorrow at 940AM or 340pm. If symptoms progress he needs to go to the ER.    Carlyle Dolly MD

## 2013-11-25 NOTE — Telephone Encounter (Signed)
This encounter was created in error - please disregard.

## 2013-11-25 NOTE — Telephone Encounter (Signed)
Patient informed and verbalized understanding of plan. 

## 2013-11-25 NOTE — Telephone Encounter (Signed)
Left message to return call 

## 2013-11-26 ENCOUNTER — Encounter: Payer: Self-pay | Admitting: Cardiology

## 2013-11-26 ENCOUNTER — Ambulatory Visit (INDEPENDENT_AMBULATORY_CARE_PROVIDER_SITE_OTHER): Payer: Medicare Other | Admitting: Cardiology

## 2013-11-26 VITALS — BP 120/80 | HR 65 | Ht 71.0 in | Wt 193.0 lb

## 2013-11-26 DIAGNOSIS — I10 Essential (primary) hypertension: Secondary | ICD-10-CM

## 2013-11-26 DIAGNOSIS — R42 Dizziness and giddiness: Secondary | ICD-10-CM | POA: Diagnosis not present

## 2013-11-26 DIAGNOSIS — I2589 Other forms of chronic ischemic heart disease: Secondary | ICD-10-CM | POA: Diagnosis not present

## 2013-11-26 DIAGNOSIS — Z79899 Other long term (current) drug therapy: Secondary | ICD-10-CM | POA: Diagnosis not present

## 2013-11-26 DIAGNOSIS — I5022 Chronic systolic (congestive) heart failure: Secondary | ICD-10-CM | POA: Diagnosis not present

## 2013-11-26 DIAGNOSIS — R609 Edema, unspecified: Secondary | ICD-10-CM | POA: Diagnosis not present

## 2013-11-26 MED ORDER — FUROSEMIDE 80 MG PO TABS: 80.0000 mg | ORAL_TABLET | Freq: Two times a day (BID) | ORAL | Status: DC

## 2013-11-26 MED ORDER — SPIRONOLACTONE 25 MG PO TABS: 25.0000 mg | ORAL_TABLET | Freq: Every day | ORAL | Status: DC

## 2013-11-26 NOTE — Patient Instructions (Signed)
   Decrease Spironolactone to 25mg  daily  Decrease Lasix to 80mg  twice a day   Continue all other medications.   Labs for BMET, CBC, Magnesium now  Repeat lab for BMET - due in 2 weeks, around July 8 Office will contact with results via phone or letter.   Follow up in  1 month

## 2013-11-26 NOTE — Progress Notes (Signed)
Clinical Summary Mr. Kavanagh is a 68 y.o.male last seen by Dr Percival Spanish, this is our first visit together. He is seen for the following medical problems. This is a focused visit on his ICM and recent episode of dizziness and hypotension  1. ICM - hx of prior CABG in 2003 as well as previous stenting - 11/2011 echo <20%, severe RV dysfunction, severe TR, PASP 56.   - ICD followed by Dr Rayann Heman, check 10/2013 with normal function with no events  - yesterday while in kitchen felt dizzy, top of head hurt. Felt better with laying. No chest pain, no SOB, no palpitations. Has had prior similar episodes when he stays out in the sun doing yard work. Low blood pressures noted yesterday 81/69 and pulse of 65. Later in day symptoms resolved, he did not take his evening medications.          Past Medical History  Diagnosis Date  . Essential hypertension   . Myocardial infarction acute   . Ischemic cardiomyopathy     EF 20% by echo 2013  . Other pulmonary embolism and infarction     on coumadin  . Hyperlipidemia   . DOE (dyspnea on exertion)   . CAD (coronary artery disease)   . CVA (cerebral infarction)   . Anxiety   . Tricuspid regurgitation     Severe  . Pulmonary hypertension     56 mm Hg     No Known Allergies   Current Outpatient Prescriptions  Medication Sig Dispense Refill  . carvedilol (COREG) 6.25 MG tablet Take 1 tablet (6.25 mg total) by mouth 2 (two) times daily with a meal.  60 tablet  6  . furosemide (LASIX) 80 MG tablet Take 1&1/2 tablets by mouth in the evening      . isosorbide mononitrate (IMDUR) 30 MG 24 hr tablet TAKE ONE TABLET BY MOUTH EVERY DAY  30 tablet  3  . lisinopril (PRINIVIL,ZESTRIL) 5 MG tablet Take 5 mg by mouth every morning.       . senna-docusate (SENOKOT-S) 8.6-50 MG per tablet Take 1 tablet by mouth daily.      [name] spironolactone (ALDACTONE) 25 MG tablet Take 50 mg by mouth every morning. & 25 mg in the evening      . warfarin (COUMADIN) 3 MG  tablet 1 1/2 tablets daily except 2 tablets on Fridays  45 tablet  3   No current facility-administered medications for this visit.     Past Surgical History  Procedure Laterality Date  . Coronary artery bypass graft  2003    4 vessel  . Cardiac defibrillator placement  11/09/06    SJM Epic II DR ICD implanted in [place], atrial lead no longer functions due to low impedance  . Implantable cardioverter defibrillator generator change  05/27/2012    Gen change.  SJM Assura VR.  Riata V lead  . Colonoscopy with esophagogastroduodenoscopy (egd)  06/06/2012    Procedure: COLONOSCOPY WITH ESOPHAGOGASTRODUODENOSCOPY (EGD);  Surgeon: Rogene Houston, MD;  Location: [facility];  Service: Endoscopy;  Laterality: N/A;  310     No Known Allergies    Family History  Problem Relation Age of Onset  . Heart disease Mother     CAD  . Leukemia Father      Social History Mr. Amory reports that he quit smoking about 18 years ago. He has never used smokeless tobacco. Mr. Swander reports that he does not drink alcohol.   Review  of Systems CONSTITUTIONAL: No weight loss, fever, chills, weakness or fatigue.  HEENT: Eyes: No visual loss, blurred vision, double vision or yellow sclerae.No hearing loss, sneezing, congestion, runny nose or sore throat.  SKIN: No rash or itching.  CARDIOVASCULAR: per HPI RESPIRATORY: No shortness of breath, cough or sputum.  GASTROINTESTINAL: No anorexia, nausea, vomiting or diarrhea. No abdominal pain or blood.  GENITOURINARY: No burning on urination, no polyuria NEUROLOGICAL: +dizziness  MUSCULOSKELETAL: No muscle, back pain, joint pain or stiffness.  LYMPHATICS: No enlarged nodes. No history of splenectomy.  PSYCHIATRIC: No history of depression or anxiety.  ENDOCRINOLOGIC: No reports of sweating, cold or heat intolerance. No polyuria or polydipsia.  [name]   Physical Examination p 65 bp 120/80 Wt 193 BMI 27 Gen: resting comfortably, no acute  distress HEENT: no scleral icterus, pupils equal round and reactive, no palptable cervical adenopathy,  CV: RRR, no m/r/g, no JVD, no carotid bruits Resp: Clear to auscultation bilaterally GI: abdomen is soft, non-tender, non-distended, normal bowel sounds, no hepatosplenomegaly MSK: extremities are warm, no edema.  Skin: warm, no rash Neuro:  no focal deficits Psych: appropriate affect    Assessment and Plan  1. ICM - LVEF <20%, he is NYHA II-III, he has an ICD - medication changes as described below   2. Dizziness/Hypotension - bp normalized today at 120/80, he has not taken his meds last night or this morning.  - orthostatics negative - dizziness resolved, symptoms likely related to low bp's caused by medications - will decrease his aldactone to 25mg  daily, decrease lasix to 80mg  bid. Will send for BMET and CBC now.       Arnoldo Lenis, M.D., F.A.C.C.

## 2013-12-07 ENCOUNTER — Other Ambulatory Visit: Payer: Self-pay | Admitting: Cardiology

## 2013-12-08 ENCOUNTER — Other Ambulatory Visit: Payer: Self-pay | Admitting: *Deleted

## 2013-12-08 MED ORDER — CARVEDILOL 6.25 MG PO TABS: 6.2500 mg | ORAL_TABLET | Freq: Two times a day (BID) | ORAL | Status: DC

## 2013-12-09 ENCOUNTER — Ambulatory Visit: Payer: Medicare Other | Admitting: Cardiology

## 2013-12-09 ENCOUNTER — Ambulatory Visit (INDEPENDENT_AMBULATORY_CARE_PROVIDER_SITE_OTHER): Payer: Medicare Other | Admitting: *Deleted

## 2013-12-09 DIAGNOSIS — Z7901 Long term (current) use of anticoagulants: Secondary | ICD-10-CM

## 2013-12-09 DIAGNOSIS — I10 Essential (primary) hypertension: Secondary | ICD-10-CM | POA: Diagnosis not present

## 2013-12-09 DIAGNOSIS — Z79899 Other long term (current) drug therapy: Secondary | ICD-10-CM | POA: Diagnosis not present

## 2013-12-09 DIAGNOSIS — I2699 Other pulmonary embolism without acute cor pulmonale: Secondary | ICD-10-CM

## 2013-12-09 DIAGNOSIS — R609 Edema, unspecified: Secondary | ICD-10-CM | POA: Diagnosis not present

## 2013-12-09 DIAGNOSIS — Z5181 Encounter for therapeutic drug level monitoring: Secondary | ICD-10-CM

## 2013-12-09 LAB — POCT INR: INR: 1.6

## 2013-12-11 ENCOUNTER — Telehealth: Payer: Self-pay

## 2013-12-11 MED ORDER — WARFARIN SODIUM 3 MG PO TABS: ORAL_TABLET | ORAL | Status: DC

## 2013-12-11 NOTE — Telephone Encounter (Signed)
Refill done as requested 

## 2013-12-22 ENCOUNTER — Encounter: Payer: Self-pay | Admitting: *Deleted

## 2013-12-25 ENCOUNTER — Ambulatory Visit (INDEPENDENT_AMBULATORY_CARE_PROVIDER_SITE_OTHER): Payer: Medicare Other | Admitting: *Deleted

## 2013-12-25 ENCOUNTER — Ambulatory Visit (INDEPENDENT_AMBULATORY_CARE_PROVIDER_SITE_OTHER): Payer: Medicare Other | Admitting: Cardiology

## 2013-12-25 ENCOUNTER — Encounter: Payer: Self-pay | Admitting: Cardiology

## 2013-12-25 VITALS — BP 117/65 | HR 55 | Ht 71.0 in | Wt 194.0 lb

## 2013-12-25 DIAGNOSIS — Z7901 Long term (current) use of anticoagulants: Secondary | ICD-10-CM | POA: Diagnosis not present

## 2013-12-25 DIAGNOSIS — I2589 Other forms of chronic ischemic heart disease: Secondary | ICD-10-CM

## 2013-12-25 DIAGNOSIS — I2699 Other pulmonary embolism without acute cor pulmonale: Secondary | ICD-10-CM

## 2013-12-25 DIAGNOSIS — R42 Dizziness and giddiness: Secondary | ICD-10-CM

## 2013-12-25 DIAGNOSIS — I10 Essential (primary) hypertension: Secondary | ICD-10-CM | POA: Diagnosis not present

## 2013-12-25 DIAGNOSIS — I5022 Chronic systolic (congestive) heart failure: Secondary | ICD-10-CM | POA: Diagnosis not present

## 2013-12-25 DIAGNOSIS — Z5181 Encounter for therapeutic drug level monitoring: Secondary | ICD-10-CM

## 2013-12-25 LAB — POCT INR: INR: 1.9

## 2013-12-25 MED ORDER — CARVEDILOL 6.25 MG PO TABS
6.2500 mg | ORAL_TABLET | Freq: Two times a day (BID) | ORAL | Status: DC
Start: 2013-12-25 — End: 2015-02-19

## 2013-12-25 MED ORDER — SILDENAFIL CITRATE 100 MG PO TABS
100.0000 mg | ORAL_TABLET | Freq: Every day | ORAL | Status: DC | PRN
Start: 2013-12-25 — End: 2014-08-05

## 2013-12-25 MED ORDER — POTASSIUM CHLORIDE CRYS ER 20 MEQ PO TBCR: 20.0000 meq | EXTENDED_RELEASE_TABLET | Freq: Every day | ORAL | Status: DC

## 2013-12-25 NOTE — Progress Notes (Signed)
Clinical Summary Mr. Kruzel is a 68 y.o.male seen today for follow up of the following medical problems.   1. ICM  - hx of prior CABG in 2003 as well as previous stenting  - 11/2011 echo <20%, severe RV dysfunction, severe TR, PASP 56.  - ICD followed by Dr Rayann Heman, check 10/2013 with normal function with no events  - last visit complained of orthostatic symptoms. We decreased his lasix and spironolactone, symptoms have improved.  - DOE with high levels of exertion that is stable. No LE edema, no orthopnea. Denies any chest pain - limiting salt intake, avoiding NSAIDs.   2. HTN - compliant with meds - does not check regularly at home  3. Hx of PE - on coumadin, denies any bleeding issues    Past Medical History  Diagnosis Date  . Essential hypertension   . Myocardial infarction acute   . Ischemic cardiomyopathy     EF 20% by echo 2013  . Other pulmonary embolism and infarction     on coumadin  . Hyperlipidemia   . DOE (dyspnea on exertion)   . CAD (coronary artery disease)   . CVA (cerebral infarction)   . Anxiety   . Tricuspid regurgitation     Severe  . Pulmonary hypertension     56 mm Hg     No Known Allergies   Current Outpatient Prescriptions  Medication Sig Dispense Refill  . carvedilol (COREG) 6.25 MG tablet Take 1 tablet (6.25 mg total) by mouth 2 (two) times daily with a meal.  60 tablet  6  . docusate sodium (COLACE) 100 MG capsule Take 100 mg by mouth daily.      [name] FLUoxetine (PROZAC) 40 MG capsule Take 40 mg by mouth daily.      . furosemide (LASIX) 80 MG tablet Take 1 tablet (80 mg total) by mouth 2 (two) times daily.      . isosorbide mononitrate (IMDUR) 30 MG 24 hr tablet TAKE ONE TABLET BY MOUTH EVERY DAY  30 tablet  3  . lisinopril (PRINIVIL,ZESTRIL) 5 MG tablet Take 5 mg by mouth every morning.       . senna-docusate (SENOKOT-S) 8.6-50 MG per tablet Take 1 tablet by mouth daily.      [name] spironolactone (ALDACTONE) 25 MG tablet Take 1 tablet (25  mg total) by mouth daily.      [name] warfarin (COUMADIN) 3 MG tablet Take as directed by coumadin clinic  60 tablet  3   No current facility-administered medications for this visit.     Past Surgical History  Procedure Laterality Date  . Coronary artery bypass graft  2003    4 vessel  . Cardiac defibrillator placement  11/09/06    SJM Epic II DR ICD implanted in [place], atrial lead no longer functions due to low impedance  . Implantable cardioverter defibrillator generator change  05/27/2012    Gen change.  SJM Assura VR.  Riata V lead  . Colonoscopy with esophagogastroduodenoscopy (egd)  06/06/2012    Procedure: COLONOSCOPY WITH ESOPHAGOGASTRODUODENOSCOPY (EGD);  Surgeon: Rogene Houston, MD;  Location: [facility];  Service: Endoscopy;  Laterality: N/A;  310     No Known Allergies    Family History  Problem Relation Age of Onset  . Heart disease Mother     CAD  . Leukemia Father      Social History Mr. Montefusco reports that he quit smoking about 18 years ago. He has never used  smokeless tobacco. Mr. Jewkes reports that he does not drink alcohol.   Review of Systems CONSTITUTIONAL: No weight loss, fever, chills, weakness or fatigue.  HEENT: Eyes: No visual loss, blurred vision, double vision or yellow sclerae.No hearing loss, sneezing, congestion, runny nose or sore throat.  SKIN: No rash or itching.  CARDIOVASCULAR: per HPI RESPIRATORY: No shortness of breath, cough or sputum.  GASTROINTESTINAL: No anorexia, nausea, vomiting or diarrhea. No abdominal pain or blood.  GENITOURINARY: No burning on urination, no polyuria NEUROLOGICAL: No headache, dizziness, syncope, paralysis, ataxia, numbness or tingling in the extremities. No change in bowel or bladder control.  MUSCULOSKELETAL: No muscle, back pain, joint pain or stiffness.  LYMPHATICS: No enlarged nodes. No history of splenectomy.  PSYCHIATRIC: No history of depression or anxiety.  ENDOCRINOLOGIC: No reports of  sweating, cold or heat intolerance. No polyuria or polydipsia.  [name]   Physical Examination p 55 bp 117/65 Wt 194 lbs BMI 27 Gen: resting comfortably, no acute distress HEENT: no scleral icterus, pupils equal round and reactive, no palptable cervical adenopathy,  CV: RRR, no m/r/g, no JVD, no carotid bruits Resp: Clear to auscultation bilaterally GI: abdomen is soft, non-tender, non-distended, normal bowel sounds, no hepatosplenomegaly MSK: extremities are warm, no edema.  Skin: warm, no rash Neuro:  no focal deficits Psych: appropriate affect   Diagnostic Studies 11/2011 Echo LVEF <20%, restrictive diastolic dysfunction, severe RV systolic dysfunction,    Assessment and Plan  1. ICM  - LVEF <20%, he is NYHA II-III, he has an ICD  - dizziness resolved with decreased diuretics, appears euvolemic today - continue current meds, further titration limited by borderline low heart rates, bp, and prior orthostatic symptoms. - describes some muscle cramps, last K 3.5. Start KCl 61m mEq daily - he wishes to start viagra, we will stop his imdur. Educated using both is strongly contraindicated  2. HTN - at goal, continue current meds  3. Hx of PE - continue coumaidn       Arnoldo Lenis, M.D., F.A.C.C.

## 2013-12-25 NOTE — Patient Instructions (Signed)
   Begin Potassium 8meq daily   Stop Imdur  Coreg refilled today  Viagra as needed  All medications sent to pharmacy. Continue all other medications.   Your physician wants you to follow up in: 6 months.  You will receive a reminder letter in the mail one-two months in advance.  If you don't receive a letter, please call our office to schedule the follow up appointment

## 2014-01-16 ENCOUNTER — Ambulatory Visit (INDEPENDENT_AMBULATORY_CARE_PROVIDER_SITE_OTHER): Payer: Medicare Other | Admitting: *Deleted

## 2014-01-16 DIAGNOSIS — I2699 Other pulmonary embolism without acute cor pulmonale: Secondary | ICD-10-CM

## 2014-01-16 DIAGNOSIS — Z5181 Encounter for therapeutic drug level monitoring: Secondary | ICD-10-CM

## 2014-01-16 DIAGNOSIS — Z7901 Long term (current) use of anticoagulants: Secondary | ICD-10-CM | POA: Diagnosis not present

## 2014-01-16 LAB — POCT INR: INR: 1.6

## 2014-01-19 ENCOUNTER — Encounter: Payer: Medicare Other | Admitting: *Deleted

## 2014-01-19 ENCOUNTER — Telehealth: Payer: Self-pay | Admitting: Cardiology

## 2014-01-19 NOTE — Telephone Encounter (Signed)
LMOVM reminding pt to send remote transmission.   

## 2014-01-20 ENCOUNTER — Encounter: Payer: Self-pay | Admitting: Cardiology

## 2014-02-06 ENCOUNTER — Ambulatory Visit (INDEPENDENT_AMBULATORY_CARE_PROVIDER_SITE_OTHER): Payer: Medicare Other | Admitting: Pharmacist

## 2014-02-06 DIAGNOSIS — I2699 Other pulmonary embolism without acute cor pulmonale: Secondary | ICD-10-CM | POA: Diagnosis not present

## 2014-02-06 DIAGNOSIS — Z5181 Encounter for therapeutic drug level monitoring: Secondary | ICD-10-CM | POA: Diagnosis not present

## 2014-02-06 DIAGNOSIS — Z7901 Long term (current) use of anticoagulants: Secondary | ICD-10-CM

## 2014-02-06 LAB — POCT INR: INR: 1.8

## 2014-02-10 ENCOUNTER — Other Ambulatory Visit: Payer: Self-pay | Admitting: Cardiology

## 2014-02-16 ENCOUNTER — Telehealth: Payer: Self-pay | Admitting: *Deleted

## 2014-02-16 NOTE — Telephone Encounter (Signed)
Message left on voice mail - has INR check on 02/27/2014 & wants to know if he can talk to Dr. Harl Bowie this day also.  Returned call, spoke with wife Letta Median) who is designated to take his calls also.  Stated she was not sure why he called.  Informed her that he is scheduled for INR check with Lattie Haw on 02/27/14 at 3:00.  Explained to her that this visit is for coumadin only and that Dr. Harl Bowie will not be in this office on that day.  Informed her that if he has something else going on that he needs to discuss with MD, I would be happy to relay message to him.  She verbalized understanding.

## 2014-02-27 ENCOUNTER — Ambulatory Visit (INDEPENDENT_AMBULATORY_CARE_PROVIDER_SITE_OTHER): Payer: Medicare Other | Admitting: *Deleted

## 2014-02-27 DIAGNOSIS — I2699 Other pulmonary embolism without acute cor pulmonale: Secondary | ICD-10-CM | POA: Diagnosis not present

## 2014-02-27 DIAGNOSIS — Z5181 Encounter for therapeutic drug level monitoring: Secondary | ICD-10-CM | POA: Diagnosis not present

## 2014-02-27 DIAGNOSIS — Z7901 Long term (current) use of anticoagulants: Secondary | ICD-10-CM | POA: Diagnosis not present

## 2014-02-27 LAB — POCT INR: INR: 1.9

## 2014-03-03 ENCOUNTER — Telehealth: Payer: Self-pay | Admitting: *Deleted

## 2014-03-03 NOTE — Telephone Encounter (Signed)
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  Advised patient that MD may require for him to come for office visit to discuss this in further detail.  Not sure that he would clear patient based off last OV in July.  Patient informed that message will be sent to provider for advice.

## 2014-03-04 NOTE — Telephone Encounter (Signed)
We can discuss in detail at next appointment, I last saw him in July it appears. Can we get an October appt for him.  Zandra Abts MD

## 2014-03-06 NOTE — Telephone Encounter (Signed)
OV scheduled for 10/8

## 2014-03-12 ENCOUNTER — Encounter: Payer: Self-pay | Admitting: Cardiology

## 2014-03-12 ENCOUNTER — Ambulatory Visit (INDEPENDENT_AMBULATORY_CARE_PROVIDER_SITE_OTHER): Payer: Medicare Other | Admitting: Cardiology

## 2014-03-12 VITALS — BP 91/54 | HR 56 | Ht 71.0 in | Wt 191.0 lb

## 2014-03-12 DIAGNOSIS — Z86711 Personal history of pulmonary embolism: Secondary | ICD-10-CM

## 2014-03-12 DIAGNOSIS — I5022 Chronic systolic (congestive) heart failure: Secondary | ICD-10-CM | POA: Diagnosis not present

## 2014-03-12 DIAGNOSIS — I255 Ischemic cardiomyopathy: Secondary | ICD-10-CM

## 2014-03-12 DIAGNOSIS — I2510 Atherosclerotic heart disease of native coronary artery without angina pectoris: Secondary | ICD-10-CM | POA: Diagnosis not present

## 2014-03-12 DIAGNOSIS — Z8679 Personal history of other diseases of the circulatory system: Secondary | ICD-10-CM

## 2014-03-12 NOTE — Progress Notes (Signed)
Clinical Summary Mr. Bitner is a 68 y.o.male seen today for follow up of the following medical problems.     1. ICM/Chronic systolic HF - hx of prior CABG in 2003 as well as previous stenting  - 11/2011 echo <20%, severe RV dysfunction, severe TR, PASP 56.  - ICD followed by Dr Rayann Heman, check 10/2013 with normal function with no events  - medical therapy has been limited by low blood pressures and orthostatic symptoms. History of prior orthostatic syncope  - DOE with high levels of exertion only that is stable. No LE edema, no orthopnea. Denies any chest pain  - limiting salt intake, avoiding NSAIDs.  2. HTN  - compliant with meds  - does not check regularly at home   3. Hx of PE  - on coumadin, denies any bleeding issues - started approx Nov 2012 when he was living in [place], [place]. The rest of the history is unclear, he reports he has been on since that time.     Past Medical History  Diagnosis Date  . Essential hypertension   . Myocardial infarction acute   . Ischemic cardiomyopathy     EF 20% by echo 2013  . Other pulmonary embolism and infarction     on coumadin  . Hyperlipidemia   . DOE (dyspnea on exertion)   . CAD (coronary artery disease)   . CVA (cerebral infarction)   . Anxiety   . Tricuspid regurgitation     Severe  . Pulmonary hypertension     56 mm Hg     No Known Allergies   Current Outpatient Prescriptions  Medication Sig Dispense Refill  . carvedilol (COREG) 6.25 MG tablet Take 1 tablet (6.25 mg total) by mouth 2 (two) times daily with a meal.  60 tablet  6  . docusate sodium (COLACE) 100 MG capsule Take 100 mg by mouth daily.      [name] FLUoxetine (PROZAC) 40 MG capsule Take 40 mg by mouth daily.      . furosemide (LASIX) 80 MG tablet Take 1 tablet (80 mg total) by mouth 2 (two) times daily.      . furosemide (LASIX) 80 MG tablet TAKE ONE & ONE-HALF TABLETS BY MOUTH IN THE MORNING AND ONE TABLET BY MOUTH  IN THE EVENING  75 tablet  3  .  lisinopril (PRINIVIL,ZESTRIL) 5 MG tablet Take 5 mg by mouth every morning.       . potassium chloride SA (K-DUR,KLOR-CON) 20 MEQ tablet Take 1 tablet (20 mEq total) by mouth daily.  30 tablet  6  . senna-docusate (SENOKOT-S) 8.6-50 MG per tablet Take 1 tablet by mouth daily.      . sildenafil (VIAGRA) 100 MG tablet Take 1 tablet (100 mg total) by mouth daily as needed for erectile dysfunction.  4 tablet  11  . spironolactone (ALDACTONE) 25 MG tablet Take 1 tablet (25 mg total) by mouth daily.      [name] warfarin (COUMADIN) 3 MG tablet Take as directed by coumadin clinic  60 tablet  3   No current facility-administered medications for this visit.     Past Surgical History  Procedure Laterality Date  . Coronary artery bypass graft  2003    4 vessel  . Cardiac defibrillator placement  11/09/06    SJM Epic II DR ICD implanted in [place] [address], atrial lead no longer functions due to low impedance  . Implantable cardioverter defibrillator generator change  05/27/2012    Gen change.  SJM Assura VR.  Riata V lead  . Colonoscopy with esophagogastroduodenoscopy (egd)  06/06/2012    Procedure: COLONOSCOPY WITH ESOPHAGOGASTRODUODENOSCOPY (EGD);  Surgeon: Rogene Houston, MD;  Location: [facility];  Service: Endoscopy;  Laterality: N/A;  310     No Known Allergies    Family History  Problem Relation Age of Onset  . Heart disease Mother     CAD  . Leukemia Father      Social History Mr. Chrisley reports that he quit smoking about 18 years ago. He has never used smokeless tobacco. Mr. Popowski reports that he does not drink alcohol.   Review of Systems CONSTITUTIONAL: No weight loss, fever, chills, weakness or fatigue.  HEENT: Eyes: No visual loss, blurred vision, double vision or yellow sclerae.No hearing loss, sneezing, congestion, runny nose or sore throat.  SKIN: No rash or itching.  CARDIOVASCULAR: per HPI RESPIRATORY: No shortness of breath, cough or sputum.  GASTROINTESTINAL: No  anorexia, nausea, vomiting or diarrhea. No abdominal pain or blood.  GENITOURINARY: No burning on urination, no polyuria NEUROLOGICAL: No headache, dizziness, syncope, paralysis, ataxia, numbness or tingling in the extremities. No change in bowel or bladder control.  MUSCULOSKELETAL: No muscle, back pain, joint pain or stiffness.  LYMPHATICS: No enlarged nodes. No history of splenectomy.  PSYCHIATRIC: No history of depression or anxiety.  ENDOCRINOLOGIC: No reports of sweating, cold or heat intolerance. No polyuria or polydipsia.  [name]   Physical Examination p 56 bp 91/54 Wt 191 lbs BMI 27 Gen: resting comfortably, no acute distress HEENT: no scleral icterus, pupils equal round and reactive, no palptable cervical adenopathy,  CV: RRR, no m/r/g, no JVD, no carotid bruits Resp: Clear to auscultation bilaterally GI: abdomen is soft, non-tender, non-distended, normal bowel sounds, no hepatosplenomegaly MSK: extremities are warm, no edema.  Skin: warm, no rash Neuro:  no focal deficits Psych: appropriate affect   Diagnostic Studies 11/2011 Echo  LVEF <20%, restrictive diastolic dysfunction, severe RV systolic dysfunction,      Assessment and Plan  1. ICM/ Chronic systolic HF - LVEF <21%, he is NYHA II-III, he has an ICD  - dizziness resolved with decreased diuretics, appears euvolemic today  - continue current meds, further titration limited by borderline low heart rates, bp, and prior orthostatic symptoms.  - former school bus driver several years ago, asked if possible he could start back. Given his multiple cardiac medical issues I have advised against it.   2. HTN  - at goal, continue current meds   3. Hx of PE  - continue coumadin - request records from [place] regarding the details of his PE and the decision to commit to long term anticoag       Arnoldo Lenis, M.D

## 2014-03-12 NOTE — Patient Instructions (Signed)
There were no changes to your medications. Continue as directed. Someone from the device clinic will contact you regarding any questions about your device. Your physician wants you to follow up in:  4 months.  You will receive a reminder letter in the mail one-two months in advance.  If you don't receive a letter, please call our office to schedule the follow up appointment.

## 2014-03-16 ENCOUNTER — Telehealth: Payer: Self-pay | Admitting: *Deleted

## 2014-03-16 NOTE — Telephone Encounter (Signed)
Left message to ask if pt could sign a release for [place] to request records

## 2014-03-16 NOTE — Telephone Encounter (Signed)
Message copied by Orion Modest on Mon Mar 16, 2014  3:43 PM ------      Message from: Burdick F      Created: Thu Mar 12, 2014  9:23 AM       Can you please request records from [facility] Nov 2012 for this patient. Also records from his previous cardiologist in [address].  ------

## 2014-03-17 NOTE — Telephone Encounter (Signed)
Pt will go to the offices below and sign a release.

## 2014-03-18 ENCOUNTER — Encounter: Payer: Self-pay | Admitting: Cardiology

## 2014-03-19 ENCOUNTER — Ambulatory Visit (INDEPENDENT_AMBULATORY_CARE_PROVIDER_SITE_OTHER): Payer: Medicare Other | Admitting: *Deleted

## 2014-03-19 DIAGNOSIS — Z5181 Encounter for therapeutic drug level monitoring: Secondary | ICD-10-CM

## 2014-03-19 DIAGNOSIS — I2699 Other pulmonary embolism without acute cor pulmonale: Secondary | ICD-10-CM

## 2014-03-19 DIAGNOSIS — Z7901 Long term (current) use of anticoagulants: Secondary | ICD-10-CM

## 2014-03-19 LAB — POCT INR: INR: 2

## 2014-03-20 DIAGNOSIS — N529 Male erectile dysfunction, unspecified: Secondary | ICD-10-CM | POA: Diagnosis not present

## 2014-03-20 DIAGNOSIS — N401 Enlarged prostate with lower urinary tract symptoms: Secondary | ICD-10-CM | POA: Diagnosis not present

## 2014-03-20 DIAGNOSIS — N400 Enlarged prostate without lower urinary tract symptoms: Secondary | ICD-10-CM | POA: Diagnosis not present

## 2014-03-23 ENCOUNTER — Telehealth: Payer: Self-pay | Admitting: *Deleted

## 2014-03-23 NOTE — Telephone Encounter (Signed)
Message copied by Malen Gauze on Mon Mar 23, 2014  4:49 PM ------      Message from: Barbarann Ehlers A      Created: Fri Mar 20, 2014  5:08 PM      Regarding: stated on gout med by pcp today       Veva Holes, Received call from Sybil at [place] office.They are going to start him on med for gout flair up and wanted you to be aware.It looks like you saw him yesterday(thurs).They didn't know how to advise his coumadin dosing.I told Golda Acre you would touch base with patient on Monday 10/19 and advise him  ------

## 2014-03-23 NOTE — Telephone Encounter (Signed)
Spoke with pt.  States he checked at [facility] and there was not a prescription for him from Dr [employer] office.  He called them back today and they are checking into it.  Pt will call me back tomorrow if they do call him in a Rx med.  States he has taken 2 Advil with some relief.  Advised to take as few of these as possible due to increased bleeding risk.  He verbalized understanding.

## 2014-03-25 ENCOUNTER — Encounter: Payer: Self-pay | Admitting: *Deleted

## 2014-03-25 ENCOUNTER — Telehealth: Payer: Self-pay | Admitting: *Deleted

## 2014-03-25 NOTE — Telephone Encounter (Signed)
Pt was started on diclofenac bid yesterday by Dr Earnest Conroy.[place] which can elevate his INR.  Pt was told to decrease his coumadin to 2 tablets daily except 3 tablets on Thursdays and INR appt was moved up to 04/02/14.  Pt verbalized understanding.

## 2014-03-25 NOTE — Telephone Encounter (Signed)
Patient was started on Diclofenac 50 mg BID by Dr.Hall / tgs

## 2014-03-25 NOTE — Progress Notes (Signed)
This encounter was created in error - please disregard.

## 2014-04-02 ENCOUNTER — Ambulatory Visit (INDEPENDENT_AMBULATORY_CARE_PROVIDER_SITE_OTHER): Payer: Medicare Other | Admitting: *Deleted

## 2014-04-02 DIAGNOSIS — I2699 Other pulmonary embolism without acute cor pulmonale: Secondary | ICD-10-CM | POA: Diagnosis not present

## 2014-04-02 DIAGNOSIS — Z7901 Long term (current) use of anticoagulants: Secondary | ICD-10-CM | POA: Diagnosis not present

## 2014-04-02 DIAGNOSIS — Z5181 Encounter for therapeutic drug level monitoring: Secondary | ICD-10-CM

## 2014-04-02 LAB — POCT INR: INR: 3

## 2014-04-06 ENCOUNTER — Telehealth: Payer: Self-pay | Admitting: Cardiology

## 2014-04-06 NOTE — Telephone Encounter (Signed)
Spoke with pt and walked him through a manual transmission. He will call back in a few minutes.

## 2014-04-16 ENCOUNTER — Ambulatory Visit (INDEPENDENT_AMBULATORY_CARE_PROVIDER_SITE_OTHER): Payer: Medicare Other | Admitting: *Deleted

## 2014-04-16 DIAGNOSIS — Z7901 Long term (current) use of anticoagulants: Secondary | ICD-10-CM | POA: Diagnosis not present

## 2014-04-16 DIAGNOSIS — Z5181 Encounter for therapeutic drug level monitoring: Secondary | ICD-10-CM | POA: Diagnosis not present

## 2014-04-16 DIAGNOSIS — I2699 Other pulmonary embolism without acute cor pulmonale: Secondary | ICD-10-CM | POA: Diagnosis not present

## 2014-04-16 LAB — POCT INR: INR: 3.4

## 2014-04-16 MED ORDER — WARFARIN SODIUM 3 MG PO TABS: ORAL_TABLET | ORAL | Status: DC

## 2014-04-23 ENCOUNTER — Encounter (INDEPENDENT_AMBULATORY_CARE_PROVIDER_SITE_OTHER): Payer: Self-pay | Admitting: *Deleted

## 2014-05-07 ENCOUNTER — Ambulatory Visit (INDEPENDENT_AMBULATORY_CARE_PROVIDER_SITE_OTHER): Payer: Medicare Other | Admitting: *Deleted

## 2014-05-07 DIAGNOSIS — Z7901 Long term (current) use of anticoagulants: Secondary | ICD-10-CM | POA: Diagnosis not present

## 2014-05-07 DIAGNOSIS — I2699 Other pulmonary embolism without acute cor pulmonale: Secondary | ICD-10-CM

## 2014-05-07 DIAGNOSIS — Z5181 Encounter for therapeutic drug level monitoring: Secondary | ICD-10-CM

## 2014-05-07 LAB — POCT INR: INR: 1.9

## 2014-05-14 ENCOUNTER — Encounter (HOSPITAL_COMMUNITY): Payer: Self-pay | Admitting: Internal Medicine

## 2014-06-11 ENCOUNTER — Ambulatory Visit (INDEPENDENT_AMBULATORY_CARE_PROVIDER_SITE_OTHER): Payer: Medicare Other | Admitting: *Deleted

## 2014-06-11 DIAGNOSIS — Z7901 Long term (current) use of anticoagulants: Secondary | ICD-10-CM

## 2014-06-11 DIAGNOSIS — Z5181 Encounter for therapeutic drug level monitoring: Secondary | ICD-10-CM

## 2014-06-11 DIAGNOSIS — I2699 Other pulmonary embolism without acute cor pulmonale: Secondary | ICD-10-CM | POA: Diagnosis not present

## 2014-06-11 LAB — POCT INR: INR: 2.4

## 2014-07-09 ENCOUNTER — Ambulatory Visit (INDEPENDENT_AMBULATORY_CARE_PROVIDER_SITE_OTHER): Payer: Medicare Other | Admitting: *Deleted

## 2014-07-09 ENCOUNTER — Encounter: Payer: Self-pay | Admitting: *Deleted

## 2014-07-09 DIAGNOSIS — Z7901 Long term (current) use of anticoagulants: Secondary | ICD-10-CM | POA: Diagnosis not present

## 2014-07-09 DIAGNOSIS — Z5181 Encounter for therapeutic drug level monitoring: Secondary | ICD-10-CM | POA: Diagnosis not present

## 2014-07-09 DIAGNOSIS — I2699 Other pulmonary embolism without acute cor pulmonale: Secondary | ICD-10-CM | POA: Diagnosis not present

## 2014-07-09 LAB — POCT INR: INR: 1.8

## 2014-07-20 ENCOUNTER — Ambulatory Visit (INDEPENDENT_AMBULATORY_CARE_PROVIDER_SITE_OTHER): Payer: Medicare Other | Admitting: Internal Medicine

## 2014-07-21 ENCOUNTER — Ambulatory Visit (INDEPENDENT_AMBULATORY_CARE_PROVIDER_SITE_OTHER): Payer: Medicare Other | Admitting: Internal Medicine

## 2014-07-21 ENCOUNTER — Encounter (INDEPENDENT_AMBULATORY_CARE_PROVIDER_SITE_OTHER): Payer: Self-pay | Admitting: Internal Medicine

## 2014-07-21 ENCOUNTER — Ambulatory Visit (HOSPITAL_COMMUNITY)
Admission: RE | Admit: 2014-07-21 | Discharge: 2014-07-21 | Disposition: A | Discharge status: Home / Self Care | Payer: Medicare Other | Source: Ambulatory Visit | Attending: Internal Medicine | Admitting: Internal Medicine

## 2014-07-21 VITALS — BP 94/70 | HR 76 | Temp 98.1°F | Ht 71.0 in | Wt 192.5 lb

## 2014-07-21 DIAGNOSIS — Z951 Presence of aortocoronary bypass graft: Secondary | ICD-10-CM | POA: Insufficient documentation

## 2014-07-21 DIAGNOSIS — R05 Cough: Secondary | ICD-10-CM

## 2014-07-21 DIAGNOSIS — R1013 Epigastric pain: Secondary | ICD-10-CM | POA: Insufficient documentation

## 2014-07-21 DIAGNOSIS — R059 Cough, unspecified: Secondary | ICD-10-CM

## 2014-07-21 DIAGNOSIS — K7469 Other cirrhosis of liver: Secondary | ICD-10-CM

## 2014-07-21 DIAGNOSIS — Z87891 Personal history of nicotine dependence: Secondary | ICD-10-CM | POA: Diagnosis not present

## 2014-07-21 MED ORDER — AZITHROMYCIN 250 MG PO TABS: ORAL_TABLET | ORAL | Status: DC

## 2014-07-21 NOTE — Patient Instructions (Addendum)
Labs, and Rx for Z-pak to his pharmacy. Further recommendations once labs and CXR back.

## 2014-07-21 NOTE — Progress Notes (Signed)
Subjective:    Patient ID: Rick [name], male    DOB: 10/31/1945, 69 y.o.   MRN: [id number]  HPI Here toeray for follow up. 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  Hx of anemia.  He presents today with c/o that he has cough. He has had the cough x 2 yrs. He says his upper abdomen has soreness from coughing. States his cough is worse. He is coughing up yellow-green mucous. He says he has felt bad since _0  mm Hg  . Cirrhosis     Past Surgical History  Procedure Laterality Date  . Coronary artery bypass graft  2003    4 vessel  . Cardiac defibrillator placement  11/09/06    SJM Epic II DR ICD implanted in [place], atrial lead no longer functions due to low impedance  . Implantable cardioverter defibrillator generator change  05/27/2012    Gen change.  SJM Assura VR.  Riata V lead  . Colonoscopy with esophagogastroduodenoscopy (egd)  06/06/2012    Procedure: COLONOSCOPY WITH ESOPHAGOGASTRODUODENOSCOPY (EGD);  Surgeon: Rogene Houston, MD;  Location[facility];  Service: Endoscopy;  Laterality: N/A;  310  .  Implantable cardioverter defibrillator (icd) generator change N/A 05/27/2012    Procedure: ICD GENERATOR CHANGE;  Surgeon: Thompson Grayer, MD;  Location: [facility] CATH LAB;  Service: Cardiovascular;  Laterality: N/A;    No Known Allergies  Current Outpatient Prescriptions on File Prior to Visit  Medication Sig Dispense Refill  . carvedilol (COREG) 6.25 MG tablet Take 1 tablet (6.25 mg total) by mouth 2 (two) times daily with a meal. 60 tablet 6  . furosemide (LASIX) 80 MG tablet 1 1/2 tab twice a day    . lisinopril (PRINIVIL,ZESTRIL) 5 MG tablet Take 5 mg by mouth every morning.     . potassium chloride SA (K-DUR,KLOR-CON) 20 MEQ tablet Take 1 tablet (20 mEq total) by mouth daily. 30 tablet 6  . spironolactone (ALDACTONE) 25 MG tablet Take 1 tablet (25 mg  total) by mouth daily.    [name] warfarin (COUMADIN) 3 MG tablet Take 2 tablets daily or as directed 180 tablet 3  . sildenafil (VIAGRA) 100 MG tablet Take 1 tablet (100 mg total) by mouth daily as needed for erectile dysfunction. 4 tablet 11   No current facility-administered medications on file prior to visit.        Objective:   Physical Exam  Filed Vitals:   07/21/14 1128  Height: _0  (1.803 m)  Weight: 192 lb 8 oz (87.317 kg)   Alert and oriented. Skin warm and dry. Oral mucosa is moist.   . Sclera anicteric, conjunctivae is pink. Thyroid not enlarged. No cervical lymphadenopathy. Lungs clear. Heart regular rate and rhythm.  Abdomen is soft. Bowel sounds are positive. No hepatomegaly. No abdominal masses felt. No tenderness.  No edema to lower extremities.  No abdominal distention. Patient coughing up yellow-green mucous while in room.        Assessment & Plan:  . History of cirrhosis/ascites secondary to congestive hepatopathy due to underlying ischemic cardiomyopathy. There has been no weight gain. No abdominal distention.  Will get a Cmet.  Hx of anemia: Hemoglobin in January of 2015 11.4 Cough: ? Viral. Will get a CBC and CXR to be sure he does not have a pneumonia.  OV in 1 year.

## 2014-07-30 ENCOUNTER — Ambulatory Visit (INDEPENDENT_AMBULATORY_CARE_PROVIDER_SITE_OTHER): Payer: Medicare Other | Admitting: Internal Medicine

## 2014-07-30 ENCOUNTER — Ambulatory Visit (INDEPENDENT_AMBULATORY_CARE_PROVIDER_SITE_OTHER): Payer: Medicare Other | Admitting: *Deleted

## 2014-07-30 DIAGNOSIS — Z5181 Encounter for therapeutic drug level monitoring: Secondary | ICD-10-CM | POA: Diagnosis not present

## 2014-07-30 DIAGNOSIS — I2699 Other pulmonary embolism without acute cor pulmonale: Secondary | ICD-10-CM

## 2014-07-30 DIAGNOSIS — I4891 Unspecified atrial fibrillation: Secondary | ICD-10-CM | POA: Diagnosis not present

## 2014-07-30 DIAGNOSIS — Z7901 Long term (current) use of anticoagulants: Secondary | ICD-10-CM

## 2014-07-30 LAB — POCT INR: INR: >8

## 2014-08-05 ENCOUNTER — Ambulatory Visit (INDEPENDENT_AMBULATORY_CARE_PROVIDER_SITE_OTHER): Payer: Medicare Other | Admitting: Cardiology

## 2014-08-05 ENCOUNTER — Ambulatory Visit: Payer: Medicare Other | Admitting: Cardiology

## 2014-08-05 ENCOUNTER — Encounter: Payer: Self-pay | Admitting: Cardiology

## 2014-08-05 ENCOUNTER — Telehealth (INDEPENDENT_AMBULATORY_CARE_PROVIDER_SITE_OTHER): Payer: Self-pay | Admitting: *Deleted

## 2014-08-05 VITALS — BP 100/63 | HR 60 | Ht 71.0 in | Wt 192.0 lb

## 2014-08-05 DIAGNOSIS — I10 Essential (primary) hypertension: Secondary | ICD-10-CM | POA: Diagnosis not present

## 2014-08-05 DIAGNOSIS — I5022 Chronic systolic (congestive) heart failure: Secondary | ICD-10-CM

## 2014-08-05 DIAGNOSIS — Z7901 Long term (current) use of anticoagulants: Secondary | ICD-10-CM

## 2014-08-05 MED ORDER — TADALAFIL 20 MG PO TABS: ORAL_TABLET | ORAL | Status: DC

## 2014-08-05 NOTE — Telephone Encounter (Signed)
Rick [name] is calling about his x-ray results.  Please call his wife at [phone] And its ok to give her results

## 2014-08-05 NOTE — Patient Instructions (Signed)
Your physician recommends that you schedule a follow-up appointment in: [place] DR. [place]  Your physician has recommended you make the following change in your medication:   TAKE CIALIS 20 MG AS NEEDED PRIOR  CONTINUE ALL OTHER MEDICATIONS AS DIRECTED  Thank you for choosing [facility]!!

## 2014-08-05 NOTE — Progress Notes (Signed)
Clinical Summary Rick [name] is a 69 y.o.male seen today for follow up of the following medical problems.   1. ICM/Chronic systolic HF - hx of prior CABG in 2003 as well as previous stenting  - 11/2011 echo <20%, severe RV dysfunction, severe TR, PASP 56.  - ICD followed by Dr Rayann Heman - medical therapy has been limited by low blood pressures and orthostatic symptoms. History of prior orthostatic syncope  - limiting salt intake, avoiding NSAIDs.  - denies any lightheadness or dizziness. Notes some SOB at times, reports DOE at 3-4 blocks. Feels slightly worst than before.Had flu 3 weeks ago, cough is improving.  - no orthopnea, no PND, no LE edema.  - compliant with meds. Weighs daily, stable 192.   2. HTN  - compliant with meds  - does not check regularly at home   3. Hx of PE  - on coumadin, denies any bleeding issues - started approx Nov 2012 when he was living in [place], [place].    Past Medical History  Diagnosis Date  . Essential hypertension   . Myocardial infarction acute   . Ischemic cardiomyopathy     EF 20% by echo 2013  . Other pulmonary embolism and infarction     on coumadin  . Hyperlipidemia   . DOE (dyspnea on exertion)   . CAD (coronary artery disease)   . CVA (cerebral infarction)   . Anxiety   . Tricuspid regurgitation     Severe  . Pulmonary hypertension     56 mm Hg  . Cirrhosis      No Known Allergies   Current Outpatient Prescriptions  Medication Sig Dispense Refill  . azithromycin (ZITHROMAX Z-PAK) 250 MG tablet As directed 6 each 0  . carvedilol (COREG) 6.25 MG tablet Take 1 tablet (6.25 mg total) by mouth 2 (two) times daily with a meal. 60 tablet 6  . furosemide (LASIX) 80 MG tablet 1 1/2 tab twice a day    . lisinopril (PRINIVIL,ZESTRIL) 5 MG tablet Take 5 mg by mouth every morning.     . potassium chloride SA (K-DUR,KLOR-CON) 20 MEQ tablet Take 1 tablet (20 mEq total) by mouth daily. 30 tablet 6  . senna-docusate (SENOKOT-S)  8.6-50 MG per tablet Take 1 tablet by mouth daily.    . sildenafil (VIAGRA) 100 MG tablet Take 1 tablet (100 mg total) by mouth daily as needed for erectile dysfunction. 4 tablet 11  . spironolactone (ALDACTONE) 25 MG tablet Take 1 tablet (25 mg total) by mouth daily.    [name] warfarin (COUMADIN) 3 MG tablet Take 2 tablets daily or as directed 180 tablet 3   No current facility-administered medications for this visit.     Past Surgical History  Procedure Laterality Date  . Coronary artery bypass graft  2003    4 vessel  . Cardiac defibrillator placement  11/09/06    SJM Epic II DR ICD implanted in [place], atrial lead no longer functions due to low impedance  . Implantable cardioverter defibrillator generator change  05/27/2012    Gen change.  SJM Assura VR.  Riata V lead  . Colonoscopy with esophagogastroduodenoscopy (egd)  06/06/2012    Procedure: COLONOSCOPY WITH ESOPHAGOGASTRODUODENOSCOPY (EGD);  Surgeon: Rogene Houston, MD;  [facility];  Service: Endoscopy;  Laterality: N/A;  310  . Implantable cardioverter defibrillator (icd) generator change N/A 05/27/2012    Procedure: ICD GENERATOR CHANGE;  Surgeon: Thompson Grayer, MD;  Location: [facility] CATH LAB;  Service: Cardiovascular;  Laterality: N/A;     No Known Allergies    Family History  Problem Relation Age of Onset  . Heart disease Mother     CAD  . Leukemia Father      Social History Mr. Clingenpeel reports that he quit smoking about 19 years ago. He has never used smokeless tobacco. Mr. Vandunk reports that he does not drink alcohol.   Review of Systems CONSTITUTIONAL: No weight loss, fever, chills, weakness or fatigue.  HEENT: Eyes: No visual loss, blurred vision, double vision or yellow sclerae.No hearing loss, sneezing, congestion, runny nose or sore throat.  SKIN: No rash or itching.  CARDIOVASCULAR: per HPI RESPIRATORY: No shortness of breath, cough or sputum.  GASTROINTESTINAL: No anorexia, nausea, vomiting  or diarrhea. No abdominal pain or blood.  GENITOURINARY: No burning on urination, no polyuria NEUROLOGICAL: No headache, dizziness, syncope, paralysis, ataxia, numbness or tingling in the extremities. No change in bowel or bladder control.  MUSCULOSKELETAL: No muscle, back pain, joint pain or stiffness.  LYMPHATICS: No enlarged nodes. No history of splenectomy.  PSYCHIATRIC: No history of depression or anxiety.  ENDOCRINOLOGIC: No reports of sweating, cold or heat intolerance. No polyuria or polydipsia.  [name]   Physical Examination p 60 bp 100/63 Wt 192 lbs BMI 27 Gen: resting comfortably, no acute distress HEENT: no scleral icterus, pupils equal round and reactive, no palptable cervical adenopathy,  CV: RRR, no m/r/g,no JVD Resp: Clear to auscultation bilaterally GI: abdomen is soft, non-tender, non-distended, normal bowel sounds, no hepatosplenomegaly MSK: extremities are warm, no edema.  Skin: warm, no rash Neuro:  no focal deficits Psych: appropriate affect   Diagnostic Studies 11/2011 Echo  LVEF <20%, restrictive diastolic dysfunction, severe RV systolic dysfunction,      Assessment and Plan  1. ICM/ Chronic systolic HF - LVEF <86%, he is NYHA II-III, he has an ICD  - continue current meds, further titration limited by borderline low heart rates, bp, and prior orthostatic symptoms.  - overall stable symptoms   2. HTN  - at goal, continue current meds   3. Hx of PE  - continue coumadin  4. Erectile dysfunction - Rx for viagra did not work given Rx to try cialis   Arnoldo Lenis, M.D.   F/u 4 months

## 2014-08-06 ENCOUNTER — Ambulatory Visit (INDEPENDENT_AMBULATORY_CARE_PROVIDER_SITE_OTHER): Payer: Medicare Other | Admitting: *Deleted

## 2014-08-06 DIAGNOSIS — Z7901 Long term (current) use of anticoagulants: Secondary | ICD-10-CM

## 2014-08-06 DIAGNOSIS — Z5181 Encounter for therapeutic drug level monitoring: Secondary | ICD-10-CM

## 2014-08-06 DIAGNOSIS — I2699 Other pulmonary embolism without acute cor pulmonale: Secondary | ICD-10-CM

## 2014-08-06 LAB — POCT INR: INR: 2.1

## 2014-08-07 NOTE — Telephone Encounter (Signed)
No answer at home #

## 2014-08-13 ENCOUNTER — Telehealth: Payer: Self-pay | Admitting: Internal Medicine

## 2014-08-13 NOTE — Telephone Encounter (Signed)
New Message  Pt hadnt had defib check since 10/2013. Pt has appt sched for June w/ [facility]. Pt wants to know what to do going forward. VM on mobile # is okay. Please call back and discuss.

## 2014-08-13 NOTE — Telephone Encounter (Signed)
LM w/ wife for pt to call back//sss

## 2014-08-17 DIAGNOSIS — H01003 Unspecified blepharitis right eye, unspecified eyelid: Secondary | ICD-10-CM | POA: Diagnosis not present

## 2014-08-18 NOTE — Telephone Encounter (Signed)
LMOM--2nd attempt//kwm

## 2014-08-18 NOTE — Telephone Encounter (Signed)
Message left at home. Results left.

## 2014-08-20 DIAGNOSIS — J069 Acute upper respiratory infection, unspecified: Secondary | ICD-10-CM | POA: Diagnosis not present

## 2014-08-20 DIAGNOSIS — Z6826 Body mass index (BMI) 26.0-26.9, adult: Secondary | ICD-10-CM | POA: Diagnosis not present

## 2014-08-27 ENCOUNTER — Ambulatory Visit (INDEPENDENT_AMBULATORY_CARE_PROVIDER_SITE_OTHER): Payer: Medicare Other | Admitting: *Deleted

## 2014-08-27 DIAGNOSIS — Z7901 Long term (current) use of anticoagulants: Secondary | ICD-10-CM | POA: Diagnosis not present

## 2014-08-27 DIAGNOSIS — Z5181 Encounter for therapeutic drug level monitoring: Secondary | ICD-10-CM

## 2014-08-27 DIAGNOSIS — I2699 Other pulmonary embolism without acute cor pulmonale: Secondary | ICD-10-CM | POA: Diagnosis not present

## 2014-08-27 LAB — POCT INR: INR: 2.9

## 2014-09-09 DIAGNOSIS — I10 Essential (primary) hypertension: Secondary | ICD-10-CM | POA: Diagnosis not present

## 2014-09-09 DIAGNOSIS — Z87891 Personal history of nicotine dependence: Secondary | ICD-10-CM | POA: Diagnosis not present

## 2014-09-09 DIAGNOSIS — R531 Weakness: Secondary | ICD-10-CM | POA: Diagnosis not present

## 2014-09-09 DIAGNOSIS — I252 Old myocardial infarction: Secondary | ICD-10-CM | POA: Diagnosis not present

## 2014-09-09 DIAGNOSIS — I509 Heart failure, unspecified: Secondary | ICD-10-CM | POA: Diagnosis not present

## 2014-09-09 DIAGNOSIS — R109 Unspecified abdominal pain: Secondary | ICD-10-CM | POA: Diagnosis not present

## 2014-09-10 DIAGNOSIS — R531 Weakness: Secondary | ICD-10-CM | POA: Diagnosis not present

## 2014-09-10 DIAGNOSIS — R109 Unspecified abdominal pain: Secondary | ICD-10-CM | POA: Diagnosis not present

## 2014-09-14 DIAGNOSIS — H2513 Age-related nuclear cataract, bilateral: Secondary | ICD-10-CM | POA: Diagnosis not present

## 2014-09-15 DIAGNOSIS — Z23 Encounter for immunization: Secondary | ICD-10-CM | POA: Diagnosis not present

## 2014-09-17 ENCOUNTER — Ambulatory Visit (INDEPENDENT_AMBULATORY_CARE_PROVIDER_SITE_OTHER): Payer: Medicare Other | Admitting: *Deleted

## 2014-09-17 DIAGNOSIS — R109 Unspecified abdominal pain: Secondary | ICD-10-CM | POA: Diagnosis not present

## 2014-09-17 DIAGNOSIS — D509 Iron deficiency anemia, unspecified: Secondary | ICD-10-CM | POA: Diagnosis not present

## 2014-09-17 DIAGNOSIS — I2699 Other pulmonary embolism without acute cor pulmonale: Secondary | ICD-10-CM | POA: Diagnosis not present

## 2014-09-17 DIAGNOSIS — K7460 Unspecified cirrhosis of liver: Secondary | ICD-10-CM | POA: Diagnosis not present

## 2014-09-17 DIAGNOSIS — Z5181 Encounter for therapeutic drug level monitoring: Secondary | ICD-10-CM

## 2014-09-17 DIAGNOSIS — Z7901 Long term (current) use of anticoagulants: Secondary | ICD-10-CM | POA: Diagnosis not present

## 2014-09-17 DIAGNOSIS — Z6826 Body mass index (BMI) 26.0-26.9, adult: Secondary | ICD-10-CM | POA: Diagnosis not present

## 2014-09-17 LAB — POCT INR: INR: 6.3

## 2014-09-24 ENCOUNTER — Ambulatory Visit (INDEPENDENT_AMBULATORY_CARE_PROVIDER_SITE_OTHER): Payer: Medicare Other | Admitting: Pharmacist

## 2014-09-24 DIAGNOSIS — I2699 Other pulmonary embolism without acute cor pulmonale: Secondary | ICD-10-CM | POA: Diagnosis not present

## 2014-09-24 DIAGNOSIS — Z5181 Encounter for therapeutic drug level monitoring: Secondary | ICD-10-CM | POA: Diagnosis not present

## 2014-09-24 DIAGNOSIS — Z7901 Long term (current) use of anticoagulants: Secondary | ICD-10-CM | POA: Diagnosis not present

## 2014-09-24 LAB — POCT INR: INR: 1.8

## 2014-10-01 ENCOUNTER — Ambulatory Visit (INDEPENDENT_AMBULATORY_CARE_PROVIDER_SITE_OTHER): Payer: Medicare Other | Admitting: *Deleted

## 2014-10-01 DIAGNOSIS — I2699 Other pulmonary embolism without acute cor pulmonale: Secondary | ICD-10-CM

## 2014-10-01 DIAGNOSIS — Z7901 Long term (current) use of anticoagulants: Secondary | ICD-10-CM

## 2014-10-01 DIAGNOSIS — Z5181 Encounter for therapeutic drug level monitoring: Secondary | ICD-10-CM | POA: Diagnosis not present

## 2014-10-01 LAB — POCT INR: INR: 4.8

## 2014-10-14 DIAGNOSIS — R109 Unspecified abdominal pain: Secondary | ICD-10-CM | POA: Diagnosis not present

## 2014-10-15 ENCOUNTER — Ambulatory Visit (INDEPENDENT_AMBULATORY_CARE_PROVIDER_SITE_OTHER): Payer: Medicare Other | Admitting: *Deleted

## 2014-10-15 DIAGNOSIS — I2699 Other pulmonary embolism without acute cor pulmonale: Secondary | ICD-10-CM

## 2014-10-15 DIAGNOSIS — Z7901 Long term (current) use of anticoagulants: Secondary | ICD-10-CM

## 2014-10-15 DIAGNOSIS — Z5181 Encounter for therapeutic drug level monitoring: Secondary | ICD-10-CM

## 2014-10-15 LAB — POCT INR: INR: 7.9

## 2014-10-16 DIAGNOSIS — R109 Unspecified abdominal pain: Secondary | ICD-10-CM | POA: Diagnosis not present

## 2014-10-20 ENCOUNTER — Ambulatory Visit (INDEPENDENT_AMBULATORY_CARE_PROVIDER_SITE_OTHER): Payer: Medicare Other | Admitting: *Deleted

## 2014-10-20 DIAGNOSIS — Z5181 Encounter for therapeutic drug level monitoring: Secondary | ICD-10-CM | POA: Diagnosis not present

## 2014-10-20 DIAGNOSIS — I4891 Unspecified atrial fibrillation: Secondary | ICD-10-CM

## 2014-10-20 LAB — POCT INR: INR: 2.5

## 2014-10-27 ENCOUNTER — Ambulatory Visit (INDEPENDENT_AMBULATORY_CARE_PROVIDER_SITE_OTHER): Payer: Medicare Other | Admitting: *Deleted

## 2014-10-27 DIAGNOSIS — I4891 Unspecified atrial fibrillation: Secondary | ICD-10-CM

## 2014-10-27 DIAGNOSIS — Z5181 Encounter for therapeutic drug level monitoring: Secondary | ICD-10-CM | POA: Diagnosis not present

## 2014-10-27 LAB — POCT INR: INR: 2.3

## 2014-11-12 DIAGNOSIS — R7301 Impaired fasting glucose: Secondary | ICD-10-CM | POA: Diagnosis not present

## 2014-11-12 DIAGNOSIS — D509 Iron deficiency anemia, unspecified: Secondary | ICD-10-CM | POA: Diagnosis not present

## 2014-11-12 DIAGNOSIS — Z79899 Other long term (current) drug therapy: Secondary | ICD-10-CM | POA: Diagnosis not present

## 2014-11-13 ENCOUNTER — Telehealth: Payer: Self-pay | Admitting: Cardiology

## 2014-11-13 ENCOUNTER — Encounter: Payer: Self-pay | Admitting: Internal Medicine

## 2014-11-13 ENCOUNTER — Ambulatory Visit (INDEPENDENT_AMBULATORY_CARE_PROVIDER_SITE_OTHER): Payer: Medicare Other | Admitting: Internal Medicine

## 2014-11-13 VITALS — BP 118/84 | HR 88 | Ht 71.0 in | Wt 189.0 lb

## 2014-11-13 DIAGNOSIS — M79605 Pain in left leg: Secondary | ICD-10-CM

## 2014-11-13 DIAGNOSIS — M79606 Pain in leg, unspecified: Secondary | ICD-10-CM | POA: Insufficient documentation

## 2014-11-13 DIAGNOSIS — I255 Ischemic cardiomyopathy: Secondary | ICD-10-CM

## 2014-11-13 DIAGNOSIS — M79604 Pain in right leg: Secondary | ICD-10-CM

## 2014-11-13 DIAGNOSIS — I5022 Chronic systolic (congestive) heart failure: Secondary | ICD-10-CM | POA: Diagnosis not present

## 2014-11-13 DIAGNOSIS — I10 Essential (primary) hypertension: Secondary | ICD-10-CM | POA: Diagnosis not present

## 2014-11-13 LAB — CUP PACEART INCLINIC DEVICE CHECK
Battery Remaining Longevity: 82.8 mo
HighPow Impedance: 38.2672
Lead Channel Impedance Value: 675 Ohm
Lead Channel Pacing Threshold Amplitude: 1 V
Lead Channel Pacing Threshold Pulse Width: 0.5 ms
Lead Channel Sensing Intrinsic Amplitude: 11.9 mV
Lead Channel Setting Pacing Amplitude: 2.5 V
Lead Channel Setting Pacing Pulse Width: 0.5 ms
MDC IDC PG SERIAL: 7025822
MDC IDC SESS DTM: 20160610100152
MDC IDC SET LEADCHNL RV SENSING SENSITIVITY: 0.5 mV
MDC IDC SET ZONE DETECTION INTERVAL: 270 ms
MDC IDC SET ZONE DETECTION INTERVAL: 340 ms
MDC IDC STAT BRADY RV PERCENT PACED: 0.17 %

## 2014-11-13 NOTE — Patient Instructions (Signed)
Your physician recommends that you continue on your current medications as directed. Please refer to the Current Medication list given to you today. Your physician has requested that you have a lower extremity arterial exercise duplex with ABI's. During this test, exercise and ultrasound are used to evaluate arterial blood flow in the legs. Allow one hour for this exam. There are no restrictions or special instructions. Merlin device check on 02/15/15. Your physician recommends that you schedule a follow-up appointment in: 12 months with Dr. Rayann Heman. You will receive a reminder letter in the mail in about 10 months reminding you to call and schedule your appointment. If you don't receive this letter, please contact our office. Please follow the information that was given to you today for a 2 gram per day sodium diet. Your physician recommends that you weigh, daily, at the same time every day, and in the same amount of clothing. Please record your daily weights on the handout provided and bring it to your next appointment.

## 2014-11-13 NOTE — Progress Notes (Signed)
PCP: Rick Cahill, MD Primary Cardiologist:  Dr  Rick [name] is a 69 y.o. male who presents today for routine electrophysiology followup.  Since his last visit to our office, the patient reports doing very well.  His primary concern is with L leg pain.  This occurs occasionally and improves with ASA.  He smoked for 40 years previously.  SOB and edema are stable. Today, he denies symptoms of palpitations, chest pain, dizziness, presyncope, syncope, or ICD shocks.  The patient is otherwise without complaint today.   Past Medical History  Diagnosis Date  . Essential hypertension   . Myocardial infarction acute   . Ischemic cardiomyopathy     EF 20% by echo 2013  . Other pulmonary embolism and infarction     on coumadin  . Hyperlipidemia   . DOE (dyspnea on exertion)   . CAD (coronary artery disease)   . CVA (cerebral infarction)   . Anxiety   . Tricuspid regurgitation     Severe  . Pulmonary hypertension     56 mm Hg  . Cirrhosis    Past Surgical History  Procedure Laterality Date  . Coronary artery bypass graft  2003    4 vessel  . Cardiac defibrillator placement  11/09/06    SJM Epic II DR ICD implanted in [place], atrial lead no longer functions due to low impedance  . Implantable cardioverter defibrillator generator change  05/27/2012    Gen change.  SJM Assura VR.  Riata V lead  . Colonoscopy with esophagogastroduodenoscopy (egd)  06/06/2012    Procedure: COLONOSCOPY WITH ESOPHAGOGASTRODUODENOSCOPY (EGD);  Surgeon: Rogene Houston, MD;  [facility];  Service: Endoscopy;  Laterality: N/A;  310  . Implantable cardioverter defibrillator (icd) generator change N/A 05/27/2012    Procedure: ICD GENERATOR CHANGE;  Surgeon: Thompson Grayer, MD;  Location: [facility] CATH LAB;  Service: Cardiovascular;  Laterality: N/A;   ROS- all systems are reviewed and negative except as per HPI above  Current Outpatient Prescriptions  Medication Sig Dispense Refill  . aspirin 81 MG  tablet Take 81 mg by mouth daily.    . carvedilol (COREG) 6.25 MG tablet Take 1 tablet (6.25 mg total) by mouth 2 (two) times daily with a meal. 60 tablet 6  . furosemide (LASIX) 80 MG tablet Take one by mouth daily    . lisinopril (PRINIVIL,ZESTRIL) 5 MG tablet Take 5 mg by mouth every morning.     . potassium chloride SA (K-DUR,KLOR-CON) 20 MEQ tablet Take 1 tablet (20 mEq total) by mouth daily. 30 tablet 6  . senna-docusate (SENOKOT-S) 8.6-50 MG per tablet Take 1 tablet by mouth daily.    [name] spironolactone (ALDACTONE) 25 MG tablet Take 1 tablet (25 mg total) by mouth daily.    . tadalafil (CIALIS) 20 MG tablet TAKE 1 TAB AS NEEDED PRIOR 3 tablet 11  . warfarin (COUMADIN) 3 MG tablet Take 2 tablets daily or as directed 180 tablet 3   No current facility-administered medications for this visit.    Physical Exam: Filed Vitals:   11/13/14 0933  BP: 118/84  Pulse: 88  Height: 5\' 11"  (1.803 m)  Weight: 85.73 kg (189 lb)  SpO2: 99%    GEN- The patient is well appearing, alert and oriented x 3 today.   Head- normocephalic, atraumatic Eyes-  Sclera clear, conjunctiva pink Ears- hearing intact Oropharynx- clear Lungs- Clear to ausculation bilaterally, normal work of breathing Chest- ICD pocket is well healed Heart- Regular rate  and rhythm,3/6 SEM LLSB GI- soft, NT, ND, + BS Extremities- no clubbing, cyanosis, or edema Diminished DP/PT pulses bilaterally Neuro- strength/ sensation intact MS- no atrophy  ICD interrogation- reviewed in detail today,  See PACEART report  Dr Rick [name] note is reviewed ekgs are reviewed   Assessment and Plan:  1.  Chronic systolic dysfunction/ Severe valvular heart disease euvolemic today Stable on an appropriate medical regimen Normal ICD function See Pace Art report No changes today Coreview was discussed with the patient today. We will enroll in the ICM device clinic for Coreview follow-up 2 gram sodium diet/ daily weights encouraged  2.  Syncope No further episodes  3. Leg pain Reduce pulses and 40 pkyr h/o tobacco is worrisome for PAD Will order ABIs and arterial dopplers today  3. HTN Stable No change required today  4. Prior PTE Has severe TR on exam and by echo Continue coumadin long term  Return to see me in 1year [facility] Follow-up with Dr Harl Bowie as scheduled

## 2014-11-13 NOTE — Telephone Encounter (Signed)
Pt wife will have pt return call in regards to [facility] clinic.

## 2014-11-16 ENCOUNTER — Telehealth: Payer: Self-pay | Admitting: Cardiology

## 2014-11-16 ENCOUNTER — Other Ambulatory Visit: Payer: Self-pay | Admitting: Internal Medicine

## 2014-11-16 ENCOUNTER — Telehealth: Payer: Self-pay | Admitting: *Deleted

## 2014-11-16 DIAGNOSIS — K7460 Unspecified cirrhosis of liver: Secondary | ICD-10-CM | POA: Diagnosis not present

## 2014-11-16 DIAGNOSIS — E782 Mixed hyperlipidemia: Secondary | ICD-10-CM | POA: Diagnosis not present

## 2014-11-16 DIAGNOSIS — K5900 Constipation, unspecified: Secondary | ICD-10-CM | POA: Diagnosis not present

## 2014-11-16 DIAGNOSIS — D509 Iron deficiency anemia, unspecified: Secondary | ICD-10-CM | POA: Diagnosis not present

## 2014-11-16 DIAGNOSIS — I509 Heart failure, unspecified: Secondary | ICD-10-CM | POA: Diagnosis not present

## 2014-11-16 DIAGNOSIS — I739 Peripheral vascular disease, unspecified: Secondary | ICD-10-CM

## 2014-11-16 DIAGNOSIS — R7301 Impaired fasting glucose: Secondary | ICD-10-CM | POA: Diagnosis not present

## 2014-11-16 NOTE — Telephone Encounter (Signed)
LMOVM for pt to return call in regards to ICM clinic.  

## 2014-11-17 ENCOUNTER — Telehealth: Payer: Self-pay | Admitting: Cardiology

## 2014-11-17 NOTE — Telephone Encounter (Signed)
Pt enrolled in [facility] clinic 1st transmission is scheduled for 12-17-14. Pt aware that home monitor is updating every night.

## 2014-11-24 ENCOUNTER — Ambulatory Visit (INDEPENDENT_AMBULATORY_CARE_PROVIDER_SITE_OTHER): Payer: Medicare Other | Admitting: *Deleted

## 2014-11-24 DIAGNOSIS — Z5181 Encounter for therapeutic drug level monitoring: Secondary | ICD-10-CM

## 2014-11-24 DIAGNOSIS — I4891 Unspecified atrial fibrillation: Secondary | ICD-10-CM

## 2014-11-24 LAB — POCT INR: INR: 1.6

## 2014-12-02 ENCOUNTER — Ambulatory Visit (INDEPENDENT_AMBULATORY_CARE_PROVIDER_SITE_OTHER): Payer: Medicare Other

## 2014-12-02 ENCOUNTER — Ambulatory Visit: Payer: Medicare Other

## 2014-12-02 DIAGNOSIS — I739 Peripheral vascular disease, unspecified: Secondary | ICD-10-CM

## 2014-12-03 ENCOUNTER — Ambulatory Visit (INDEPENDENT_AMBULATORY_CARE_PROVIDER_SITE_OTHER): Payer: Medicare Other | Admitting: Cardiology

## 2014-12-03 ENCOUNTER — Encounter: Payer: Self-pay | Admitting: Cardiology

## 2014-12-03 VITALS — BP 115/76 | HR 75 | Ht 71.0 in | Wt 187.0 lb

## 2014-12-03 DIAGNOSIS — I5022 Chronic systolic (congestive) heart failure: Secondary | ICD-10-CM | POA: Diagnosis not present

## 2014-12-03 DIAGNOSIS — I255 Ischemic cardiomyopathy: Secondary | ICD-10-CM | POA: Diagnosis not present

## 2014-12-03 DIAGNOSIS — M79604 Pain in right leg: Secondary | ICD-10-CM | POA: Diagnosis not present

## 2014-12-03 DIAGNOSIS — R0602 Shortness of breath: Secondary | ICD-10-CM | POA: Diagnosis not present

## 2014-12-03 DIAGNOSIS — I10 Essential (primary) hypertension: Secondary | ICD-10-CM

## 2014-12-03 DIAGNOSIS — Z7901 Long term (current) use of anticoagulants: Secondary | ICD-10-CM

## 2014-12-03 DIAGNOSIS — I2699 Other pulmonary embolism without acute cor pulmonale: Secondary | ICD-10-CM

## 2014-12-03 DIAGNOSIS — M79605 Pain in left leg: Secondary | ICD-10-CM

## 2014-12-03 DIAGNOSIS — R1084 Generalized abdominal pain: Secondary | ICD-10-CM

## 2014-12-03 MED ORDER — ALBUTEROL SULFATE HFA 108 (90 BASE) MCG/ACT IN AERS: 1.0000 | INHALATION_SPRAY | Freq: Four times a day (QID) | RESPIRATORY_TRACT | Status: DC | PRN

## 2014-12-03 NOTE — Patient Instructions (Addendum)
Your physician recommends that you schedule a follow-up appointment in: 3 months with Dr. Harl Bowie  Your physician has recommended you make the following change in your medication:   TAKE LASIX 80 MG AS NEEDED FOR SWELLING  TAKE 1 PUFF ALBUTEROL INHALER AS NEEDED EVERY 6 HOURS  TAKE OVER THE COUNTER ZANTAC 150 MG TWICE DAILY  Your physician has recommended that you have a pulmonary function test. Pulmonary Function Tests are a group of tests that measure how well air moves in and out of your lungs.   Thank you for choosing [facility]!!

## 2014-12-03 NOTE — Progress Notes (Signed)
Clinical Summary Rick [name] is a 69 y.o.male seen today for follow up of the following medical problems.   1. ICM/Chronic systolic HF - hx of prior CABG in 2003 as well as previous stenting  - 11/2011 echo <20%, severe RV dysfunction, severe TR, PASP 56.  - ICD followed by Dr Rayann Heman, normal check 11/2014 - medical therapy has been limited by low blood pressures and orthostatic symptoms. History of prior orthostatic syncope  - limiting salt intake, avoiding NSAIDs.   - denies any SOB/ DOE. Occas LE edema, no orthopnea. Weights stable at home around 187 lbs.  - compliant with meds  2. HTN  - compliant with meds  - does not check regularly at home   3. Hx of PE  - on coumadin, occasional nosebleeds but no other bleeding - started approx Nov 2012 when he was living in [place], [place].   4. Leg pain - 11/2014 normal ABIs - seems primarily to be related to taking his lasix. Now taking just prn, symptoms have improved  5. Abdominal pain - started late last week abdominal discomfort. No diarrhea, no constipation. Symptoms better with burping.   6. Hx of tobacco absuse - 40 year hx of smoking, quit several years ago - reports occasional SOB and wheezing. - reports never tested for COPD  7. OSA  - reports abnormal sleep study several years ago. Did not tolerate CPAP and has not work.  - + snoring, no apneic episodes, some daytime fatigue. Reports old positive time.  Past Medical History  Diagnosis Date  . Essential hypertension   . Myocardial infarction acute   . Ischemic cardiomyopathy     EF 20% by echo 2013  . Other pulmonary embolism and infarction     on coumadin  . Hyperlipidemia   . DOE (dyspnea on exertion)   . CAD (coronary artery disease)   . CVA (cerebral infarction)   . Anxiety   . Tricuspid regurgitation     Severe  . Pulmonary hypertension     56 mm Hg  . Cirrhosis      No Known Allergies   Current Outpatient Prescriptions  Medication Sig  Dispense Refill  . aspirin 81 MG tablet Take 81 mg by mouth daily.    . carvedilol (COREG) 6.25 MG tablet Take 1 tablet (6.25 mg total) by mouth 2 (two) times daily with a meal. 60 tablet 6  . furosemide (LASIX) 80 MG tablet Take one by mouth daily    . lisinopril (PRINIVIL,ZESTRIL) 5 MG tablet Take 5 mg by mouth every morning.     . potassium chloride SA (K-DUR,KLOR-CON) 20 MEQ tablet Take 1 tablet (20 mEq total) by mouth daily. 30 tablet 6  . senna-docusate (SENOKOT-S) 8.6-50 MG per tablet Take 1 tablet by mouth daily.    [name] spironolactone (ALDACTONE) 25 MG tablet Take 1 tablet (25 mg total) by mouth daily.    . tadalafil (CIALIS) 20 MG tablet TAKE 1 TAB AS NEEDED PRIOR 3 tablet 11  . warfarin (COUMADIN) 3 MG tablet Take 2 tablets daily or as directed 180 tablet 3   No current facility-administered medications for this visit.     Past Surgical History  Procedure Laterality Date  . Coronary artery bypass graft  2003    4 vessel  . Cardiac defibrillator placement  11/09/06    SJM Epic II DR ICD implanted in [place], atrial lead no longer functions due to low impedance  . Implantable cardioverter defibrillator generator  change  05/27/2012    Gen change.  SJM Assura VR.  Riata V lead  . Colonoscopy with esophagogastroduodenoscopy (egd)  06/06/2012    Procedure: COLONOSCOPY WITH ESOPHAGOGASTRODUODENOSCOPY (EGD);  Surgeon: Rogene Houston, MD;  [facility];  Service: Endoscopy;  Laterality: N/A;  310  . Implantable cardioverter defibrillator (icd) generator change N/A 05/27/2012    Procedure: ICD GENERATOR CHANGE;  Surgeon: Thompson Grayer, MD;  Location: [facility] CATH LAB;  Service: Cardiovascular;  Laterality: N/A;     No Known Allergies    Family History  Problem Relation Age of Onset  . Heart disease Mother     CAD  . Leukemia Father      Social History Rick [name] reports that he quit smoking about 19 years ago. His smoking use included Cigarettes. He started smoking  about 57 years ago. He has a 19 pack-year smoking history. He has never used smokeless tobacco. Rick [name] reports that he does not drink alcohol.   Review of Systems CONSTITUTIONAL: No weight loss, fever, chills, weakness or fatigue.  HEENT: Eyes: No visual loss, blurred vision, double vision or yellow sclerae.No hearing loss, sneezing, congestion, runny nose or sore throat.  SKIN: No rash or itching.  CARDIOVASCULAR: per HPI RESPIRATORY: No shortness of breath, cough or sputum.  GASTROINTESTINAL: per HPI GENITOURINARY: No burning on urination, no polyuria NEUROLOGICAL: No headache, dizziness, syncope, paralysis, ataxia, numbness or tingling in the extremities. No change in bowel or bladder control.  MUSCULOSKELETAL: No muscle, back pain, joint pain or stiffness.  LYMPHATICS: No enlarged nodes. No history of splenectomy.  PSYCHIATRIC: No history of depression or anxiety.  ENDOCRINOLOGIC: No reports of sweating, cold or heat intolerance. No polyuria or polydipsia.  [name]   Physical Examination Filed Vitals:   12/03/14 0856  BP: 115/76  Pulse: 75   Filed Vitals:   12/03/14 0856  Height: 5\' 11"  (1.803 m)  Weight: 187 lb (84.823 kg)    Gen: resting comfortably, no acute distress HEENT: no scleral icterus, pupils equal round and reactive, no palptable cervical adenopathy,  CV: RRR, 3/6 systolic murmur LLSB, prominent JVD Resp: Clear to auscultation bilaterally GI: abdomen is soft, non-tender, non-distended, normal bowel sounds, no hepatosplenomegaly MSK: extremities are warm, no edema.  Skin: warm, no rash Neuro:  no focal deficits Psych: appropriate affect   Diagnostic Studies 11/2011 Echo  LVEF <20%, restrictive diastolic dysfunction, severe RV systolic dysfunction,   12/3530 ABI Right 1 Left 1.1  Assessment and Plan  1. ICM/ Chronic systolic HF - LVEF <99%, he is NYHA II-III, he has an ICD  - continue current meds, further titration limited by borderline low heart  rates, bp, and prior orthostatic symptoms.  - overall stable symptoms   2. HTN  - at goal, continue current meds   3. Hx of PE  - continue coumadin - discussed NOACs, he is not interested at this time  4. Leg pain - negative ABIs, primarily associated with taking lasix. Improved since started taking lasix prn only  5. Tobacco abuse - long history of smoking, quit several years ago - reports occasional SOB and wheezing - will order PFTs, order rescue inhaler albuterol  6. OSA - per his report, states he never was compliant with CPAP due to discomfort - explained in detail association between OSA and CHF, including potential to worsen his CHF - he continues to elect to defer on repeat sleep testing.   7. Abdominal pain - probable indigestion, recommended taking zantac 150mg   bid over weekend and if symptoms persist to call pcp   F/u 3 months Arnoldo Lenis, M.D..

## 2014-12-04 DIAGNOSIS — I5189 Other ill-defined heart diseases: Secondary | ICD-10-CM | POA: Diagnosis not present

## 2014-12-04 DIAGNOSIS — K761 Chronic passive congestion of liver: Secondary | ICD-10-CM | POA: Diagnosis not present

## 2014-12-04 DIAGNOSIS — I509 Heart failure, unspecified: Secondary | ICD-10-CM | POA: Diagnosis not present

## 2014-12-04 DIAGNOSIS — R1013 Epigastric pain: Secondary | ICD-10-CM | POA: Diagnosis not present

## 2014-12-04 DIAGNOSIS — Z7901 Long term (current) use of anticoagulants: Secondary | ICD-10-CM | POA: Diagnosis not present

## 2014-12-04 DIAGNOSIS — R109 Unspecified abdominal pain: Secondary | ICD-10-CM | POA: Diagnosis not present

## 2014-12-04 DIAGNOSIS — Z79899 Other long term (current) drug therapy: Secondary | ICD-10-CM | POA: Diagnosis not present

## 2014-12-04 DIAGNOSIS — I10 Essential (primary) hypertension: Secondary | ICD-10-CM | POA: Diagnosis not present

## 2014-12-04 DIAGNOSIS — I252 Old myocardial infarction: Secondary | ICD-10-CM | POA: Diagnosis not present

## 2014-12-04 DIAGNOSIS — Z951 Presence of aortocoronary bypass graft: Secondary | ICD-10-CM | POA: Diagnosis not present

## 2014-12-04 DIAGNOSIS — Z885 Allergy status to narcotic agent status: Secondary | ICD-10-CM | POA: Diagnosis not present

## 2014-12-04 DIAGNOSIS — I2510 Atherosclerotic heart disease of native coronary artery without angina pectoris: Secondary | ICD-10-CM | POA: Diagnosis not present

## 2014-12-04 DIAGNOSIS — I517 Cardiomegaly: Secondary | ICD-10-CM | POA: Diagnosis not present

## 2014-12-04 DIAGNOSIS — K859 Acute pancreatitis, unspecified: Secondary | ICD-10-CM | POA: Diagnosis not present

## 2014-12-10 NOTE — Addendum Note (Signed)
Addended by: Merlene Laughter on: 12/10/2014 03:52 PM   Modules accepted: Orders

## 2014-12-11 ENCOUNTER — Ambulatory Visit (HOSPITAL_COMMUNITY)
Admission: RE | Admit: 2014-12-11 | Discharge: 2014-12-11 | Disposition: A | Discharge status: Home / Self Care | Payer: Medicare Other | Source: Ambulatory Visit | Attending: Cardiology | Admitting: Cardiology

## 2014-12-11 DIAGNOSIS — R0602 Shortness of breath: Secondary | ICD-10-CM | POA: Insufficient documentation

## 2014-12-11 LAB — PULMONARY FUNCTION TEST
DL/VA % PRED: 90 %
DL/VA: 4.22 ml/min/mmHg/L
DLCO UNC: 19.19 ml/min/mmHg
DLCO unc % pred: 56 %
FEF 25-75 Post: 2.01 L/sec
FEF 25-75 Pre: 1.35 L/sec
FEF2575-%Change-Post: 48 %
FEF2575-%PRED-PRE: 51 %
FEF2575-%Pred-Post: 76 %
FEV1-%Change-Post: 4 %
FEV1-%Pred-Post: 73 %
FEV1-%Pred-Pre: 70 %
FEV1-PRE: 2.13 L
FEV1-Post: 2.24 L
FEV1FVC-%Change-Post: 9 %
FEV1FVC-%PRED-PRE: 95 %
FEV6-%Change-Post: -2 %
FEV6-%PRED-POST: 72 %
FEV6-%PRED-PRE: 74 %
FEV6-POST: 2.78 L
FEV6-Pre: 2.86 L
FEV6FVC-%Change-Post: 0 %
FEV6FVC-%PRED-POST: 104 %
FEV6FVC-%PRED-PRE: 103 %
FVC-%CHANGE-POST: -3 %
FVC-%PRED-POST: 69 %
FVC-%PRED-PRE: 72 %
FVC-POST: 2.78 L
FVC-Pre: 2.89 L
POST FEV6/FVC RATIO: 100 %
PRE FEV1/FVC RATIO: 74 %
Post FEV1/FVC ratio: 81 %
Pre FEV6/FVC Ratio: 99 %
RV % PRED: 116 %
RV: 2.89 L
TLC % PRED: 80 %
TLC: 5.84 L

## 2014-12-11 MED ORDER — ALBUTEROL SULFATE (2.5 MG/3ML) 0.083% IN NEBU
2.5000 mg | INHALATION_SOLUTION | Freq: Once | RESPIRATORY_TRACT | Status: AC
  Administered 2014-12-11: 2.5 mg via RESPIRATORY_TRACT

## 2014-12-14 NOTE — Telephone Encounter (Signed)
Spoke w/pt and pt contacted wrong office.

## 2014-12-15 ENCOUNTER — Ambulatory Visit (INDEPENDENT_AMBULATORY_CARE_PROVIDER_SITE_OTHER): Payer: Medicare Other | Admitting: *Deleted

## 2014-12-15 DIAGNOSIS — Z5181 Encounter for therapeutic drug level monitoring: Secondary | ICD-10-CM

## 2014-12-15 DIAGNOSIS — I4891 Unspecified atrial fibrillation: Secondary | ICD-10-CM | POA: Diagnosis not present

## 2014-12-15 LAB — POCT INR: INR: 2.2

## 2014-12-17 ENCOUNTER — Telehealth: Payer: Self-pay | Admitting: Cardiology

## 2014-12-17 ENCOUNTER — Encounter: Payer: Medicare Other | Admitting: *Deleted

## 2014-12-17 NOTE — Telephone Encounter (Signed)
LMOVM reminding pt to send remote transmission.   

## 2014-12-18 ENCOUNTER — Telehealth: Payer: Self-pay | Admitting: Internal Medicine

## 2014-12-18 ENCOUNTER — Other Ambulatory Visit (HOSPITAL_COMMUNITY): Payer: Self-pay | Admitting: Respiratory Therapy

## 2014-12-18 ENCOUNTER — Ambulatory Visit (HOSPITAL_COMMUNITY): Admission: RE | Admit: 2014-12-18 | Payer: Medicare Other | Source: Ambulatory Visit

## 2014-12-18 NOTE — Telephone Encounter (Signed)
°  1. Has your device fired? No ° °2. Is you device beeping? No ° °3. Are you experiencing draining or swelling at device site? No ° °4. Are you calling to see if we received your device transmission? Yes ° °5. Have you passed out? No ° °

## 2014-12-18 NOTE — Telephone Encounter (Signed)
Attempted to call pt. Phone continually rings. No VM.

## 2014-12-22 NOTE — Telephone Encounter (Signed)
Called other # on file which was wife's cell. Wife was not at home w/ pt.   Redialed landline #, phone continually rings without answer or VM.   Pt called device clinic while I was trying to simultaneously call him. We attempted to trouble shoot but w/out success. I gave pt tech svcs #. Pt understood to call them (vs. Me calling on his behalf).

## 2014-12-22 NOTE — Telephone Encounter (Signed)
Attempted to trouble shoot Merlin w/ pt. Phone call was disconnected.

## 2014-12-28 ENCOUNTER — Telehealth: Payer: Self-pay | Admitting: *Deleted

## 2014-12-28 MED ORDER — TIOTROPIUM BROMIDE MONOHYDRATE 18 MCG IN CAPS: 18.0000 ug | ORAL_CAPSULE | Freq: Every day | RESPIRATORY_TRACT | Status: DC

## 2014-12-28 NOTE — Telephone Encounter (Signed)
-----   Message from Arnoldo Lenis, MD sent at 12/28/2014 11:16 AM EDT ----- Breathing tests show mild COPD. Please start on spiriva 45mcg daily and forward results to pcp  Zandra Abts MD

## 2014-12-28 NOTE — Telephone Encounter (Signed)
Pt aware, Spiriva sent to pharmacy, routed to pcp for further management

## 2015-01-12 ENCOUNTER — Ambulatory Visit (INDEPENDENT_AMBULATORY_CARE_PROVIDER_SITE_OTHER): Payer: Medicare Other | Admitting: *Deleted

## 2015-01-12 DIAGNOSIS — Z5181 Encounter for therapeutic drug level monitoring: Secondary | ICD-10-CM

## 2015-01-12 DIAGNOSIS — I4891 Unspecified atrial fibrillation: Secondary | ICD-10-CM

## 2015-01-12 LAB — POCT INR: INR: 1.9

## 2015-01-17 ENCOUNTER — Other Ambulatory Visit: Payer: Self-pay | Admitting: Cardiology

## 2015-01-21 ENCOUNTER — Other Ambulatory Visit: Payer: Self-pay | Admitting: Cardiology

## 2015-01-25 DIAGNOSIS — D649 Anemia, unspecified: Secondary | ICD-10-CM | POA: Diagnosis not present

## 2015-01-25 DIAGNOSIS — R109 Unspecified abdominal pain: Secondary | ICD-10-CM | POA: Diagnosis not present

## 2015-01-30 DIAGNOSIS — Z951 Presence of aortocoronary bypass graft: Secondary | ICD-10-CM | POA: Diagnosis not present

## 2015-01-30 DIAGNOSIS — S6992XA Unspecified injury of left wrist, hand and finger(s), initial encounter: Secondary | ICD-10-CM | POA: Diagnosis not present

## 2015-01-30 DIAGNOSIS — S60812A Abrasion of left wrist, initial encounter: Secondary | ICD-10-CM | POA: Diagnosis not present

## 2015-01-30 DIAGNOSIS — S59802A Other specified injuries of left elbow, initial encounter: Secondary | ICD-10-CM | POA: Diagnosis not present

## 2015-01-30 DIAGNOSIS — Z23 Encounter for immunization: Secondary | ICD-10-CM | POA: Diagnosis not present

## 2015-01-30 DIAGNOSIS — S59801A Other specified injuries of right elbow, initial encounter: Secondary | ICD-10-CM | POA: Diagnosis not present

## 2015-01-30 DIAGNOSIS — S50312A Abrasion of left elbow, initial encounter: Secondary | ICD-10-CM | POA: Diagnosis not present

## 2015-01-30 DIAGNOSIS — S4990XA Unspecified injury of shoulder and upper arm, unspecified arm, initial encounter: Secondary | ICD-10-CM | POA: Diagnosis not present

## 2015-01-30 DIAGNOSIS — S63502A Unspecified sprain of left wrist, initial encounter: Secondary | ICD-10-CM | POA: Diagnosis not present

## 2015-02-02 ENCOUNTER — Ambulatory Visit (INDEPENDENT_AMBULATORY_CARE_PROVIDER_SITE_OTHER): Payer: Medicare Other | Admitting: *Deleted

## 2015-02-02 DIAGNOSIS — I4891 Unspecified atrial fibrillation: Secondary | ICD-10-CM | POA: Diagnosis not present

## 2015-02-02 DIAGNOSIS — Z5181 Encounter for therapeutic drug level monitoring: Secondary | ICD-10-CM | POA: Diagnosis not present

## 2015-02-02 LAB — POCT INR: INR: 1.7

## 2015-02-04 DIAGNOSIS — M1000 Idiopathic gout, unspecified site: Secondary | ICD-10-CM | POA: Diagnosis not present

## 2015-02-10 ENCOUNTER — Telehealth: Payer: Self-pay | Admitting: Internal Medicine

## 2015-02-10 NOTE — Telephone Encounter (Signed)
New Message      Pt calling stating that he doesn't have a house phone and doesn't know how he is suppose to do his remote check. Please call back and advise.

## 2015-02-10 NOTE — Telephone Encounter (Signed)
Spoke w/ pt and he informed me that he doesn't have a cell adapter. Pt agreed to appt on 02-26-15 at 1:00 PM in the [place] office w/ the device clinic and he will bring his home monitor with him.

## 2015-02-15 DIAGNOSIS — D649 Anemia, unspecified: Secondary | ICD-10-CM | POA: Diagnosis not present

## 2015-02-15 DIAGNOSIS — R109 Unspecified abdominal pain: Secondary | ICD-10-CM | POA: Diagnosis not present

## 2015-02-16 ENCOUNTER — Ambulatory Visit (INDEPENDENT_AMBULATORY_CARE_PROVIDER_SITE_OTHER): Payer: Medicare Other | Admitting: *Deleted

## 2015-02-16 DIAGNOSIS — Z5181 Encounter for therapeutic drug level monitoring: Secondary | ICD-10-CM

## 2015-02-16 DIAGNOSIS — I4891 Unspecified atrial fibrillation: Secondary | ICD-10-CM | POA: Diagnosis not present

## 2015-02-16 LAB — POCT INR: INR: 1.7

## 2015-02-19 ENCOUNTER — Encounter: Payer: Self-pay | Admitting: Cardiology

## 2015-02-19 ENCOUNTER — Ambulatory Visit (INDEPENDENT_AMBULATORY_CARE_PROVIDER_SITE_OTHER): Payer: Medicare Other | Admitting: Cardiology

## 2015-02-19 VITALS — BP 113/78 | HR 67 | Ht 71.0 in | Wt 190.0 lb

## 2015-02-19 DIAGNOSIS — I5022 Chronic systolic (congestive) heart failure: Secondary | ICD-10-CM | POA: Diagnosis not present

## 2015-02-19 DIAGNOSIS — I2510 Atherosclerotic heart disease of native coronary artery without angina pectoris: Secondary | ICD-10-CM | POA: Diagnosis not present

## 2015-02-19 DIAGNOSIS — I255 Ischemic cardiomyopathy: Secondary | ICD-10-CM

## 2015-02-19 MED ORDER — CARVEDILOL 3.125 MG PO TABS: 3.1250 mg | ORAL_TABLET | Freq: Two times a day (BID) | ORAL | Status: DC

## 2015-02-19 MED ORDER — FUROSEMIDE 80 MG PO TABS: 80.0000 mg | ORAL_TABLET | Freq: Every day | ORAL | Status: DC

## 2015-02-19 NOTE — Patient Instructions (Signed)
Your physician recommends that you schedule a follow-up appointment in: 2 MONTHS WITH DR. BRANCH  Your physician has recommended you make the following change in your medication:   DECREASE COREG TO 3.125 MG [place] physician has requested that you have an echocardiogram. Echocardiography is a painless test that uses sound waves to create images of your heart. It provides your doctor with information about the size and shape of your heart and how well your heart's chambers and valves are working. This procedure takes approximately one hour. There are no restrictions for this procedure.  Thank you for choosing [name]!!

## 2015-02-19 NOTE — Progress Notes (Signed)
Patient ID: Rick [name], male   DOB: Jul 24, 1945, 69 y.o.   MRN: [id number]     Clinical Summary Rick [name] is a 69 y.o.male seen today for follow up of the following medical problems.   1. ICM/Chronic systolic HF - hx of prior CABG in 2003 as well as previous stenting  - 11/2011 echo <20%, severe RV dysfunction, severe TR, PASP 56.  - ICD followed by Dr Rayann Heman, normal check 11/2014 - medical therapy has been limited by low blood pressures and orthostatic symptoms. History of prior orthostatic syncope  - limiting salt intake, avoiding NSAIDs.   - stable DOE with moderate activity. Weights at home stable 186 lbs.  - compliant with meds. Notes signficant fatigue typically after taking his medications.   2. HTN  - compliant with meds  - does not check regularly at home   3. Hx of PE  - on coumadin, occasional nosebleeds but no other bleeding - started approx Nov 2012 when he was living in [place], [place].    4. OSA  - reports abnormal sleep study several years ago. Did not tolerate CPAP and has not work.  - + snoring, no apneic episodes, some daytime fatigue.   5. COPD - difficulty affording inhalers  6. Severe TR - no LE edema, stable DOE   Past Medical History  Diagnosis Date  . Essential hypertension   . Myocardial infarction acute   . Ischemic cardiomyopathy     EF 20% by echo 2013  . Other pulmonary embolism and infarction     on coumadin  . Hyperlipidemia   . DOE (dyspnea on exertion)   . CAD (coronary artery disease)   . CVA (cerebral infarction)   . Anxiety   . Tricuspid regurgitation     Severe  . Pulmonary hypertension     56 mm Hg  . Cirrhosis      No Known Allergies   Current Outpatient Prescriptions  Medication Sig Dispense Refill  . albuterol (PROVENTIL HFA;VENTOLIN HFA) 108 (90 BASE) MCG/ACT inhaler Inhale 1 puff into the lungs every 6 (six) hours as needed for wheezing or shortness of breath. 1 Inhaler 2  . aspirin 81 MG tablet  Take 81 mg by mouth daily.    . carvedilol (COREG) 6.25 MG tablet Take 1 tablet (6.25 mg total) by mouth 2 (two) times daily with a meal. 60 tablet 6  . furosemide (LASIX) 80 MG tablet Take 1 tablet (80 mg total) by mouth daily. As needed for swelling 90 tablet 3  . lisinopril (PRINIVIL,ZESTRIL) 5 MG tablet Take 1 tablet (5 mg total) by mouth daily. 90 tablet 3  . potassium chloride SA (K-DUR,KLOR-CON) 20 MEQ tablet Take 1 tablet (20 mEq total) by mouth daily. 30 tablet 6  . ranitidine (ZANTAC) 150 MG capsule Take 150 mg by mouth 2 (two) times daily.    [name] senna-docusate (SENOKOT-S) 8.6-50 MG per tablet Take 1 tablet by mouth daily.    [name] spironolactone (ALDACTONE) 25 MG tablet Take 1 tablet (25 mg total) by mouth daily.    . tadalafil (CIALIS) 20 MG tablet TAKE 1 TAB AS NEEDED PRIOR 3 tablet 11  . tiotropium (SPIRIVA) 18 MCG inhalation capsule Place 1 capsule (18 mcg total) into inhaler and inhale daily. 30 capsule 3  . warfarin (COUMADIN) 3 MG tablet Take 2 tablets daily or as directed 180 tablet 3   No current facility-administered medications for this visit.     Past Surgical History  Procedure Laterality  Date  . Coronary artery bypass graft  2003    4 vessel  . Cardiac defibrillator placement  11/09/06    SJM Epic II DR ICD implanted in [place], atrial lead no longer functions due to low impedance  . Implantable cardioverter defibrillator generator change  05/27/2012    Gen change.  SJM Assura VR.  Riata V lead  . Colonoscopy with esophagogastroduodenoscopy (egd)  06/06/2012    Procedure: COLONOSCOPY WITH ESOPHAGOGASTRODUODENOSCOPY (EGD);  Surgeon: Rogene Houston, MD;  [facility];  Service: Endoscopy;  Laterality: N/A;  310  . Implantable cardioverter defibrillator (icd) generator change N/A 05/27/2012    Procedure: ICD GENERATOR CHANGE;  Surgeon: Thompson Grayer, MD;  Location: [facility] CATH LAB;  Service: Cardiovascular;  Laterality: N/A;     No Known Allergies    Family  History  Problem Relation Age of Onset  . Heart disease Mother     CAD  . Leukemia Father      Social History Rick [name] reports that he quit smoking about 19 years ago. His smoking use included Cigarettes. He started smoking about 58 years ago. He has a 19 pack-year smoking history. He has never used smokeless tobacco. Rick [name] reports that he does not drink alcohol.   Review of Systems CONSTITUTIONAL: +fatigue HEENT: Eyes: No visual loss, blurred vision, double vision or yellow sclerae.No hearing loss, sneezing, congestion, runny nose or sore throat.  SKIN: No rash or itching.  CARDIOVASCULAR: per HPI RESPIRATORY: No shortness of breath, cough or sputum.  GASTROINTESTINAL: No anorexia, nausea, vomiting or diarrhea. No abdominal pain or blood.  GENITOURINARY: No burning on urination, no polyuria NEUROLOGICAL: No headache, dizziness, syncope, paralysis, ataxia, numbness or tingling in the extremities. No change in bowel or bladder control.  MUSCULOSKELETAL: No muscle, back pain, joint pain or stiffness.  LYMPHATICS: No enlarged nodes. No history of splenectomy.  PSYCHIATRIC: No history of depression or anxiety.  ENDOCRINOLOGIC: No reports of sweating, cold or heat intolerance. No polyuria or polydipsia.  [name]   Physical Examination Filed Vitals:   02/19/15 0857  BP: 113/78  Pulse: 67   Filed Vitals:   02/19/15 0857  Height: 5\' 11"  (1.803 m)  Weight: 190 lb (86.183 kg)    Gen: resting comfortably, no acute distress HEENT: no scleral icterus, pupils equal round and reactive, no palptable cervical adenopathy,  CV: RRR, 3/6 systolic murmur LLSB, +JVD Resp: Clear to auscultation bilaterally GI: abdomen is soft, non-tender, non-distended, normal bowel sounds, no hepatosplenomegaly MSK: extremities are warm, no edema.  Skin: warm, no rash Neuro:  no focal deficits Psych: appropriate affect   Diagnostic Studies 11/2011 Echo  LVEF <20%, restrictive diastolic dysfunction,  severe RV systolic dysfunction,   01/6760 ABI Right 1 Left 1.1  12/2014 PFTs Mild COPD  Assessment and Plan  1. ICM/ Chronic systolic HF - LVEF <95%, he is NYHA II-III, he has an ICD  - continue current meds, further titration limited by borderline low heart rates, bp, and prior orthostatic symptoms.  - will repeat echo, last study over 3 years ago - reports some fatigue after taking meds, will decrease coreg to 3.125mg  bid   2. HTN  - at goal, continue current meds   3. Hx of PE  - continue coumadin - discussed NOACs, he is not interested at this time  4. OSA - per his report, states he never was compliant with CPAP due to discomfort - he continues to elect to defer on repeat sleep testing.  5. Severe TR - repeat echo   F/u 2 months  Arnoldo Lenis, M.D.

## 2015-02-26 ENCOUNTER — Ambulatory Visit (INDEPENDENT_AMBULATORY_CARE_PROVIDER_SITE_OTHER): Payer: Medicare Other | Admitting: *Deleted

## 2015-02-26 DIAGNOSIS — I5022 Chronic systolic (congestive) heart failure: Secondary | ICD-10-CM

## 2015-02-26 DIAGNOSIS — I255 Ischemic cardiomyopathy: Secondary | ICD-10-CM

## 2015-02-26 DIAGNOSIS — Z95810 Presence of automatic (implantable) cardiac defibrillator: Secondary | ICD-10-CM | POA: Diagnosis not present

## 2015-02-26 LAB — CUP PACEART INCLINIC DEVICE CHECK
HIGH POWER IMPEDANCE MEASURED VALUE: 37 Ohm
Lead Channel Impedance Value: 587.5 Ohm
Lead Channel Pacing Threshold Amplitude: 0.75 V
Lead Channel Sensing Intrinsic Amplitude: 11.7 mV
Lead Channel Setting Sensing Sensitivity: 0.5 mV
MDC IDC MSMT BATTERY REMAINING LONGEVITY: 79.2 mo
MDC IDC MSMT LEADCHNL RV PACING THRESHOLD PULSEWIDTH: 0.5 ms
MDC IDC PG SERIAL: 7025822
MDC IDC SESS DTM: 20160923114917
MDC IDC SET LEADCHNL RV PACING AMPLITUDE: 2.5 V
MDC IDC SET LEADCHNL RV PACING PULSEWIDTH: 0.5 ms
MDC IDC SET ZONE DETECTION INTERVAL: 270 ms
MDC IDC SET ZONE DETECTION INTERVAL: 340 ms
MDC IDC STAT BRADY RV PERCENT PACED: 0.14 %

## 2015-02-26 NOTE — Progress Notes (Signed)
ICD check in clinic. Normal device function. Threshold and sensing consistent with previous device measurements. Impedance trends stable over time. No evidence of any ventricular arrhythmias. Histogram distribution appropriate for patient and level of activity. No changes made this session. Device programmed at appropriate safety margins. Device programmed to optimize intrinsic conduction. Estimated longevity 6.6 years. Pt enrolled in remote follow-up. Plan to order cell adapter as pt no longer has home landline for monitor. Merlin 05/26/15, ROV with [facility] in June.

## 2015-03-04 ENCOUNTER — Ambulatory Visit (INDEPENDENT_AMBULATORY_CARE_PROVIDER_SITE_OTHER): Payer: Medicare Other

## 2015-03-04 ENCOUNTER — Ambulatory Visit (INDEPENDENT_AMBULATORY_CARE_PROVIDER_SITE_OTHER): Payer: Medicare Other | Admitting: *Deleted

## 2015-03-04 ENCOUNTER — Other Ambulatory Visit: Payer: Self-pay

## 2015-03-04 DIAGNOSIS — I4891 Unspecified atrial fibrillation: Secondary | ICD-10-CM

## 2015-03-04 DIAGNOSIS — Z5181 Encounter for therapeutic drug level monitoring: Secondary | ICD-10-CM | POA: Diagnosis not present

## 2015-03-04 DIAGNOSIS — I5022 Chronic systolic (congestive) heart failure: Secondary | ICD-10-CM

## 2015-03-04 LAB — POCT INR: INR: 3

## 2015-03-10 ENCOUNTER — Telehealth: Payer: Self-pay | Admitting: *Deleted

## 2015-03-10 NOTE — Telephone Encounter (Signed)
-----   Message from Arnoldo Lenis, MD sent at 03/10/2015  1:40 PM EDT ----- Echo overall unchanged from last study. Heart pumping function remains weak, not improved since last study. We will continue current meds  Zandra Abts MD

## 2015-03-10 NOTE — Telephone Encounter (Signed)
Pt aware, routed to pcp 

## 2015-04-01 ENCOUNTER — Ambulatory Visit (INDEPENDENT_AMBULATORY_CARE_PROVIDER_SITE_OTHER): Payer: Medicare Other | Admitting: *Deleted

## 2015-04-01 DIAGNOSIS — Z5181 Encounter for therapeutic drug level monitoring: Secondary | ICD-10-CM | POA: Diagnosis not present

## 2015-04-01 DIAGNOSIS — I4891 Unspecified atrial fibrillation: Secondary | ICD-10-CM | POA: Diagnosis not present

## 2015-04-01 LAB — POCT INR: INR: 2.7

## 2015-04-15 ENCOUNTER — Encounter (INDEPENDENT_AMBULATORY_CARE_PROVIDER_SITE_OTHER): Payer: Self-pay | Admitting: *Deleted

## 2015-04-20 ENCOUNTER — Encounter: Payer: Self-pay | Admitting: Internal Medicine

## 2015-04-22 ENCOUNTER — Ambulatory Visit: Payer: Medicare Other | Admitting: Cardiology

## 2015-05-03 DIAGNOSIS — R2240 Localized swelling, mass and lump, unspecified lower limb: Secondary | ICD-10-CM | POA: Diagnosis not present

## 2015-05-03 DIAGNOSIS — R040 Epistaxis: Secondary | ICD-10-CM | POA: Diagnosis not present

## 2015-05-03 DIAGNOSIS — K7460 Unspecified cirrhosis of liver: Secondary | ICD-10-CM | POA: Diagnosis not present

## 2015-05-03 DIAGNOSIS — I5040 Unspecified combined systolic (congestive) and diastolic (congestive) heart failure: Secondary | ICD-10-CM | POA: Diagnosis not present

## 2015-05-03 DIAGNOSIS — Z86711 Personal history of pulmonary embolism: Secondary | ICD-10-CM | POA: Diagnosis not present

## 2015-05-03 DIAGNOSIS — I509 Heart failure, unspecified: Secondary | ICD-10-CM | POA: Diagnosis not present

## 2015-05-03 DIAGNOSIS — R609 Edema, unspecified: Secondary | ICD-10-CM | POA: Diagnosis not present

## 2015-05-13 ENCOUNTER — Ambulatory Visit (INDEPENDENT_AMBULATORY_CARE_PROVIDER_SITE_OTHER): Payer: Medicare Other | Admitting: *Deleted

## 2015-05-13 ENCOUNTER — Encounter: Payer: Self-pay | Admitting: *Deleted

## 2015-05-13 ENCOUNTER — Encounter: Payer: Self-pay | Admitting: Cardiology

## 2015-05-13 ENCOUNTER — Ambulatory Visit (INDEPENDENT_AMBULATORY_CARE_PROVIDER_SITE_OTHER): Payer: Medicare Other | Admitting: Cardiology

## 2015-05-13 VITALS — BP 108/71 | HR 77 | Ht 71.0 in | Wt 178.0 lb

## 2015-05-13 DIAGNOSIS — I10 Essential (primary) hypertension: Secondary | ICD-10-CM

## 2015-05-13 DIAGNOSIS — I2510 Atherosclerotic heart disease of native coronary artery without angina pectoris: Secondary | ICD-10-CM

## 2015-05-13 DIAGNOSIS — I255 Ischemic cardiomyopathy: Secondary | ICD-10-CM

## 2015-05-13 DIAGNOSIS — I4891 Unspecified atrial fibrillation: Secondary | ICD-10-CM

## 2015-05-13 DIAGNOSIS — I5022 Chronic systolic (congestive) heart failure: Secondary | ICD-10-CM

## 2015-05-13 DIAGNOSIS — Z5181 Encounter for therapeutic drug level monitoring: Secondary | ICD-10-CM

## 2015-05-13 LAB — POCT INR: INR: 2.9

## 2015-05-13 NOTE — Progress Notes (Signed)
Patient ID: MELVA CISNEROS, male   DOB: 10/02/1945, 69 y.o.   MRN: [id number]     Clinical Summary Mr. Curles is a 69 y.o.male seen today for follow up of the following medical problems.   1. ICM/Chronic systolic HF - hx of prior CABG in 2003 as well as previous stenting  - 11/2011 echo <20%, severe RV dysfunction, severe TR, PASP 56.  - 02/2015 echo LVEF 25%, restrictive diastolic dysfunction, mod RV dysfunction, PASP 64 - ICD followed by Dr Rayann Heman, normal check 02/2015 - medical therapy has been limited by low blood pressures and orthostatic symptoms. History of prior orthostatic syncope  - limiting salt intake, avoiding NSAIDs.  - last visit we decreased coreg to 3.125mg  bid due to fatigue. Energy improved some.  - SOB is stable. Weight is down 6 lbs since last visit. Notes some LE edema at times - compliant with meds - home weights around 170 lbs and stable   2. HTN  - compliant with meds  - does not check regularly at home   3. Hx of PE  - on coumadin, occasional nosebleeds but no other bleeding - started approx Nov 2012 when he was living in [place], [place].    4. OSA  - reports abnormal sleep study several years ago. Did not tolerate CPAP  5. COPD - difficulty affording inhalers  6. Tricuspid regurgitation - moderate by last echo 02/2015 - no LE edema, stable DOE  7. Hyperlipidemia - has not been on statin due to history of NASH Past Medical History  Diagnosis Date  . Essential hypertension   . Myocardial infarction acute   . Ischemic cardiomyopathy     EF 20% by echo 2013  . Other pulmonary embolism and infarction     on coumadin  . Hyperlipidemia   . DOE (dyspnea on exertion)   . CAD (coronary artery disease)   . CVA (cerebral infarction)   . Anxiety   . Tricuspid regurgitation     Severe  . Pulmonary hypertension     56 mm Hg  . Cirrhosis      No Known Allergies   Current Outpatient Prescriptions  Medication Sig Dispense Refill  .  albuterol (PROVENTIL HFA;VENTOLIN HFA) 108 (90 BASE) MCG/ACT inhaler Inhale 1 puff into the lungs every 6 (six) hours as needed for wheezing or shortness of breath. (Patient not taking: Reported on 02/26/2015) 1 Inhaler 2  . aspirin 81 MG tablet Take 81 mg by mouth daily.    . carvedilol (COREG) 3.125 MG tablet Take 1 tablet (3.125 mg total) by mouth 2 (two) times daily. 180 tablet 3  . furosemide (LASIX) 80 MG tablet Take 1 tablet (80 mg total) by mouth daily. As needed for swelling 90 tablet 3  . lisinopril (PRINIVIL,ZESTRIL) 5 MG tablet Take 1 tablet (5 mg total) by mouth daily. 90 tablet 3  . potassium chloride SA (K-DUR,KLOR-CON) 20 MEQ tablet Take 1 tablet (20 mEq total) by mouth daily. 30 tablet 6  . ranitidine (ZANTAC) 150 MG capsule Take 150 mg by mouth 2 (two) times daily.    [name] senna-docusate (SENOKOT-S) 8.6-50 MG per tablet Take 1 tablet by mouth daily.    [name] spironolactone (ALDACTONE) 25 MG tablet Take 1 tablet (25 mg total) by mouth daily.    . tadalafil (CIALIS) 20 MG tablet TAKE 1 TAB AS NEEDED PRIOR 3 tablet 11  . tiotropium (SPIRIVA) 18 MCG inhalation capsule Place 1 capsule (18 mcg total) into inhaler and inhale daily. (  Patient not taking: Reported on 02/26/2015) 30 capsule 3  . warfarin (COUMADIN) 3 MG tablet Take 2 tablets daily or as directed 180 tablet 3   No current facility-administered medications for this visit.     Past Surgical History  Procedure Laterality Date  . Coronary artery bypass graft  2003    4 vessel  . Cardiac defibrillator placement  11/09/06    SJM Epic II DR ICD implanted in [place], atrial lead no longer functions due to low impedance  . Implantable cardioverter defibrillator generator change  05/27/2012    Gen change.  SJM Assura VR.  Riata V lead  . Colonoscopy with esophagogastroduodenoscopy (egd)  06/06/2012    Procedure: COLONOSCOPY WITH ESOPHAGOGASTRODUODENOSCOPY (EGD);  Surgeon: Rogene Houston, MD;  [facility];  Service:  Endoscopy;  Laterality: N/A;  310  . Implantable cardioverter defibrillator (icd) generator change N/A 05/27/2012    Procedure: ICD GENERATOR CHANGE;  Surgeon: Thompson Grayer, MD;  Location: [facility] CATH LAB;  Service: Cardiovascular;  Laterality: N/A;     No Known Allergies    Family History  Problem Relation Age of Onset  . Heart disease Mother     CAD  . Leukemia Father      Social History Mr. Sumi reports that he quit smoking about 19 years ago. His smoking use included Cigarettes. He started smoking about 58 years ago. He has a 19 pack-year smoking history. He has never used smokeless tobacco. Mr. Curless reports that he does not drink alcohol.   Review of Systems CONSTITUTIONAL: No weight loss, fever, chills, weakness or fatigue.  HEENT: Eyes: No visual loss, blurred vision, double vision or yellow sclerae.No hearing loss, sneezing, congestion, runny nose or sore throat.  SKIN: No rash or itching.  CARDIOVASCULAR: per hpi RESPIRATORY: No shortness of breath, cough or sputum.  GASTROINTESTINAL: No anorexia, nausea, vomiting or diarrhea. No abdominal pain or blood.  GENITOURINARY: No burning on urination, no polyuria NEUROLOGICAL: No headache, dizziness, syncope, paralysis, ataxia, numbness or tingling in the extremities. No change in bowel or bladder control.  MUSCULOSKELETAL: No muscle, back pain, joint pain or stiffness.  LYMPHATICS: No enlarged nodes. No history of splenectomy.  PSYCHIATRIC: No history of depression or anxiety.  ENDOCRINOLOGIC: No reports of sweating, cold or heat intolerance. No polyuria or polydipsia.  [name]   Physical Examination Filed Vitals:   05/13/15 1439  BP: 108/71  Pulse: 77   Filed Vitals:   05/13/15 1439  Height: 5\' 11"  (1.803 m)  Weight: 178 lb (80.74 kg)    Gen: resting comfortably, no acute distress HEENT: no scleral icterus, pupils equal round and reactive, no palptable cervical adenopathy,  CV: RRR, 3/6 sysotlic murmur LLSB, +  jvd Resp: Clear to auscultation bilaterally GI: abdomen is soft, non-tender, non-distended, normal bowel sounds, no hepatosplenomegaly MSK: extremities are warm, no edema.  Skin: warm, no rash Neuro:  no focal deficits Psych: appropriate affect   Diagnostic Studies 11/2011 Echo  LVEF <20%, restrictive diastolic dysfunction, severe RV systolic dysfunction,   [id number] ABI Right 1 Left 1.1  12/2014 PFTs Mild COPD  02/2015 echo Study Conclusions  - Left ventricle: The cavity size was severely dilated. Wall thickness was normal. Systolic function was severely reduced. The estimated ejection fraction was 25%. There is akinesis of the apicalanteroseptal and anterolateral myocardium. There is akinesis of the basalinferior myocardium. Doppler parameters are consistent with restrictive physiology, indicative of decreased left ventricular diastolic compliance and/or increased left atrial pressure. - Aortic valve: Moderately  calcified annulus. Trileaflet; mildly calcified leaflets. There was trivial regurgitation. - Mitral valve: Mildly thickened leaflets . There was mild regurgitation. - Left atrium: The atrium was moderately dilated. - Right ventricle: The cavity size was moderately dilated. Pacer wire or catheter noted in right ventricle. Systolic function was mildly to moderately reduced. - Right atrium: The atrium was severely dilated. - Tricuspid valve: There was moderate regurgitation. - Pulmonary arteries: Systolic pressure was severely increased. PA peak pressure: 64 mm Hg (S). - Pericardium, extracardiac: There was no pericardial effusion.  Impressions:  - Severe LV chamber dilatation with LVEF approximately 25%, wall motion abnormalities consistent with ischemic cardiomyopathy. Restrictive diastolic filling pattern. Moderate left atrial enlargement. Mild mitral regurgitation. Sclerotic aortic valve with trivial aortic regurgitation.  Moderate RV dysfunction. Device wire present in right heart. Moderate tricuspid regurgitation with severe pulmonary hypertension, PASP 64 mmHg. Severe right atrial enlargement.   Assessment and Plan  1. ICM/ Chronic systolic HF - LVEF 99991111, he is NYHA II-III, he has an ICD  - continue current meds, further titration limited by borderline low heart rates, bp, and prior orthostatic symptoms.    2. HTN  - at goal, continue current meds   3. Hx of PE  - continue coumadin - discussed NOACs, he has not neem interested   4. OSA - per his report, states he never was compliant with CPAP due to discomfort - he continues to elect to defer on repeat sleep testing.   5. Hyperlipidemia - request pcp labs - continue statin    Arnoldo Lenis, M.D

## 2015-05-13 NOTE — Patient Instructions (Signed)
Your physician recommends that you schedule a follow-up appointment in: 3 months with Dr. Branch  Your physician recommends that you continue on your current medications as directed. Please refer to the Current Medication list given to you today.  Thank you for choosing [facility]!!    

## 2015-05-20 DIAGNOSIS — I10 Essential (primary) hypertension: Secondary | ICD-10-CM | POA: Diagnosis not present

## 2015-05-20 DIAGNOSIS — E782 Mixed hyperlipidemia: Secondary | ICD-10-CM | POA: Diagnosis not present

## 2015-05-20 DIAGNOSIS — R7301 Impaired fasting glucose: Secondary | ICD-10-CM | POA: Diagnosis not present

## 2015-05-20 DIAGNOSIS — Z125 Encounter for screening for malignant neoplasm of prostate: Secondary | ICD-10-CM | POA: Diagnosis not present

## 2015-05-24 DIAGNOSIS — K5900 Constipation, unspecified: Secondary | ICD-10-CM | POA: Diagnosis not present

## 2015-05-24 DIAGNOSIS — D696 Thrombocytopenia, unspecified: Secondary | ICD-10-CM | POA: Diagnosis not present

## 2015-05-24 DIAGNOSIS — Z86711 Personal history of pulmonary embolism: Secondary | ICD-10-CM | POA: Diagnosis not present

## 2015-05-24 DIAGNOSIS — R7301 Impaired fasting glucose: Secondary | ICD-10-CM | POA: Diagnosis not present

## 2015-05-24 DIAGNOSIS — R040 Epistaxis: Secondary | ICD-10-CM | POA: Diagnosis not present

## 2015-05-24 DIAGNOSIS — E782 Mixed hyperlipidemia: Secondary | ICD-10-CM | POA: Diagnosis not present

## 2015-05-24 DIAGNOSIS — I5040 Unspecified combined systolic (congestive) and diastolic (congestive) heart failure: Secondary | ICD-10-CM | POA: Diagnosis not present

## 2015-05-24 DIAGNOSIS — D509 Iron deficiency anemia, unspecified: Secondary | ICD-10-CM | POA: Diagnosis not present

## 2015-05-24 DIAGNOSIS — K7460 Unspecified cirrhosis of liver: Secondary | ICD-10-CM | POA: Diagnosis not present

## 2015-06-01 ENCOUNTER — Ambulatory Visit (INDEPENDENT_AMBULATORY_CARE_PROVIDER_SITE_OTHER): Payer: Medicare Other | Admitting: *Deleted

## 2015-06-01 ENCOUNTER — Telehealth: Payer: Self-pay | Admitting: Cardiology

## 2015-06-01 DIAGNOSIS — I255 Ischemic cardiomyopathy: Secondary | ICD-10-CM

## 2015-06-01 NOTE — Telephone Encounter (Signed)
Spoke with pt and reminded pt of remote transmission that is due today. Pt verbalized understanding.   

## 2015-06-02 ENCOUNTER — Other Ambulatory Visit: Payer: Self-pay | Admitting: Cardiology

## 2015-06-02 LAB — CUP PACEART REMOTE DEVICE CHECK
Battery Remaining Longevity: 77 mo
Battery Remaining Percentage: 70 %
Date Time Interrogation Session: 20161228003855
HIGH POWER IMPEDANCE MEASURED VALUE: 37 Ohm
Lead Channel Setting Sensing Sensitivity: 0.5 mV
MDC IDC LEAD IMPLANT DT: 20080606
MDC IDC LEAD LOCATION: 753860
MDC IDC LEAD MODEL: 7040
MDC IDC MSMT BATTERY VOLTAGE: 2.98 V
MDC IDC MSMT LEADCHNL RV IMPEDANCE VALUE: 550 Ohm
MDC IDC MSMT LEADCHNL RV SENSING INTR AMPL: 11.7 mV
MDC IDC PG SERIAL: 7025822
MDC IDC SET LEADCHNL RV PACING AMPLITUDE: 2.5 V
MDC IDC SET LEADCHNL RV PACING PULSEWIDTH: 0.5 ms
MDC IDC STAT BRADY RV PERCENT PACED: 1 %

## 2015-06-02 NOTE — Progress Notes (Signed)
Remote ICD transmission.   

## 2015-06-03 DIAGNOSIS — K7030 Alcoholic cirrhosis of liver without ascites: Secondary | ICD-10-CM | POA: Diagnosis not present

## 2015-06-03 DIAGNOSIS — R17 Unspecified jaundice: Secondary | ICD-10-CM | POA: Diagnosis not present

## 2015-06-11 ENCOUNTER — Other Ambulatory Visit: Payer: Self-pay | Admitting: *Deleted

## 2015-06-11 MED ORDER — WARFARIN SODIUM 3 MG PO TABS: ORAL_TABLET | ORAL | Status: DC

## 2015-06-17 DIAGNOSIS — R17 Unspecified jaundice: Secondary | ICD-10-CM | POA: Diagnosis not present

## 2015-06-17 DIAGNOSIS — K7030 Alcoholic cirrhosis of liver without ascites: Secondary | ICD-10-CM | POA: Diagnosis not present

## 2015-06-21 ENCOUNTER — Telehealth: Payer: Self-pay | Admitting: Cardiology

## 2015-06-21 NOTE — Telephone Encounter (Signed)
LMOVM for pt requesting that he send a manual transmission b/c his home monitor has not updated in at least 8 days.

## 2015-06-23 ENCOUNTER — Telehealth: Payer: Self-pay | Admitting: Cardiology

## 2015-06-23 DIAGNOSIS — I10 Essential (primary) hypertension: Secondary | ICD-10-CM | POA: Diagnosis not present

## 2015-06-23 DIAGNOSIS — Z79891 Long term (current) use of opiate analgesic: Secondary | ICD-10-CM | POA: Diagnosis not present

## 2015-06-23 DIAGNOSIS — R609 Edema, unspecified: Secondary | ICD-10-CM | POA: Diagnosis not present

## 2015-06-23 DIAGNOSIS — R252 Cramp and spasm: Secondary | ICD-10-CM | POA: Diagnosis not present

## 2015-06-23 DIAGNOSIS — Z8679 Personal history of other diseases of the circulatory system: Secondary | ICD-10-CM | POA: Diagnosis not present

## 2015-06-23 DIAGNOSIS — Z79899 Other long term (current) drug therapy: Secondary | ICD-10-CM | POA: Diagnosis not present

## 2015-06-23 DIAGNOSIS — R600 Localized edema: Secondary | ICD-10-CM | POA: Diagnosis not present

## 2015-06-23 DIAGNOSIS — R4181 Age-related cognitive decline: Secondary | ICD-10-CM | POA: Diagnosis not present

## 2015-06-23 DIAGNOSIS — E876 Hypokalemia: Secondary | ICD-10-CM | POA: Diagnosis not present

## 2015-06-23 DIAGNOSIS — Z7982 Long term (current) use of aspirin: Secondary | ICD-10-CM | POA: Diagnosis not present

## 2015-06-23 DIAGNOSIS — R531 Weakness: Secondary | ICD-10-CM | POA: Diagnosis not present

## 2015-06-23 DIAGNOSIS — Z7901 Long term (current) use of anticoagulants: Secondary | ICD-10-CM | POA: Diagnosis not present

## 2015-06-23 DIAGNOSIS — Z950 Presence of cardiac pacemaker: Secondary | ICD-10-CM | POA: Diagnosis not present

## 2015-06-23 DIAGNOSIS — Z951 Presence of aortocoronary bypass graft: Secondary | ICD-10-CM | POA: Diagnosis not present

## 2015-06-23 DIAGNOSIS — Z9889 Other specified postprocedural states: Secondary | ICD-10-CM | POA: Diagnosis not present

## 2015-06-23 DIAGNOSIS — I6789 Other cerebrovascular disease: Secondary | ICD-10-CM | POA: Diagnosis not present

## 2015-06-23 DIAGNOSIS — R4182 Altered mental status, unspecified: Secondary | ICD-10-CM | POA: Diagnosis not present

## 2015-06-23 DIAGNOSIS — Z720 Tobacco use: Secondary | ICD-10-CM | POA: Diagnosis not present

## 2015-06-23 NOTE — Telephone Encounter (Signed)
LMOVM informing pt that we received his remote transmission on 06-01-15.

## 2015-06-23 NOTE — Telephone Encounter (Signed)
Follow Up ° °4. Are you calling to see if we received your device transmission? Yes  ° ° ° °

## 2015-06-25 ENCOUNTER — Encounter: Payer: Self-pay | Admitting: Cardiology

## 2015-06-26 DIAGNOSIS — Z7901 Long term (current) use of anticoagulants: Secondary | ICD-10-CM | POA: Diagnosis not present

## 2015-06-28 ENCOUNTER — Other Ambulatory Visit (HOSPITAL_COMMUNITY): Payer: Self-pay | Admitting: Internal Medicine

## 2015-06-28 ENCOUNTER — Ambulatory Visit (HOSPITAL_COMMUNITY)
Admission: RE | Admit: 2015-06-28 | Discharge: 2015-06-28 | Disposition: A | Discharge status: Home / Self Care | Payer: Medicare Other | Source: Ambulatory Visit | Attending: Internal Medicine | Admitting: Internal Medicine

## 2015-06-28 DIAGNOSIS — R609 Edema, unspecified: Secondary | ICD-10-CM | POA: Diagnosis present

## 2015-06-28 DIAGNOSIS — R600 Localized edema: Secondary | ICD-10-CM | POA: Diagnosis not present

## 2015-07-19 ENCOUNTER — Telehealth: Payer: Self-pay | Admitting: Cardiology

## 2015-07-19 NOTE — Telephone Encounter (Signed)
LMOVM request that pt send remote transmission b/c his home monitor has not updated in at least 8 days.

## 2015-07-22 ENCOUNTER — Ambulatory Visit (INDEPENDENT_AMBULATORY_CARE_PROVIDER_SITE_OTHER): Payer: Medicare Other | Admitting: *Deleted

## 2015-07-22 DIAGNOSIS — Z5181 Encounter for therapeutic drug level monitoring: Secondary | ICD-10-CM

## 2015-07-22 DIAGNOSIS — I4891 Unspecified atrial fibrillation: Secondary | ICD-10-CM | POA: Diagnosis not present

## 2015-07-22 LAB — POCT INR: INR: 6.6

## 2015-07-27 ENCOUNTER — Encounter (INDEPENDENT_AMBULATORY_CARE_PROVIDER_SITE_OTHER): Payer: Self-pay | Admitting: *Deleted

## 2015-07-27 ENCOUNTER — Ambulatory Visit (INDEPENDENT_AMBULATORY_CARE_PROVIDER_SITE_OTHER): Payer: Medicare Other | Admitting: Internal Medicine

## 2015-07-27 ENCOUNTER — Encounter (INDEPENDENT_AMBULATORY_CARE_PROVIDER_SITE_OTHER): Payer: Self-pay | Admitting: Internal Medicine

## 2015-07-27 ENCOUNTER — Ambulatory Visit (INDEPENDENT_AMBULATORY_CARE_PROVIDER_SITE_OTHER): Payer: Medicare Other | Admitting: *Deleted

## 2015-07-27 VITALS — BP 146/70 | HR 64 | Temp 98.0°F | Ht 71.0 in | Wt 187.0 lb

## 2015-07-27 DIAGNOSIS — I4891 Unspecified atrial fibrillation: Secondary | ICD-10-CM | POA: Diagnosis not present

## 2015-07-27 DIAGNOSIS — Z5181 Encounter for therapeutic drug level monitoring: Secondary | ICD-10-CM

## 2015-07-27 DIAGNOSIS — K7030 Alcoholic cirrhosis of liver without ascites: Secondary | ICD-10-CM | POA: Diagnosis not present

## 2015-07-27 LAB — POCT INR: INR: 2.5

## 2015-07-27 NOTE — Patient Instructions (Signed)
OV in 6 months.  Labs today.  

## 2015-07-27 NOTE — Progress Notes (Addendum)
Subjective:    Patient ID: Rick [name], male    DOB: [birth year], 70 y.o.   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 Hx of anemia.  He tells me today he is doing pretty good. His appetite is okay. He has lost 5 pounds since his last visit in February. He usually has a BM daily. He takes Miralax as needed.  He may have a BM daily and may skip a day.  He does have some SOB on exertion.  Hepatitis markers were negative.  Hx of ischemic cardiomyopathy/systolic HF 123456 Echo: EF 25%   05/03/2015 ALP 67, ALT 25, ALT 10, Total protein 6.8, albumin 3.6,  Total bili 2.5.  H and H 10.5 and 36.2, platelet ct 130. BUN 18, Creatinine 2.4  Hepatic Function Panel   CMP Latest Ref Rng 06/16/2013 03/10/2013 12/11/2012  Glucose 70 - 99 mg/dL - 97 116(H)  BUN 6 - 23 mg/dL - 12 40(H)  Creatinine 0.50 - 1.35 mg/dL - 1.28 1.18  Sodium 135 - 145 mEq/L - 137 133(L)  Potassium 3.5 - 5.3 mEq/L - 4.6 4.3  Chloride 96 - 112 mEq/L - 108 99  CO2 19 - 32 mEq/L - 22 21  Calcium 8.4 - 10.5 mg/dL - 9.0 9.4  Total Protein 6.0 - 8.3 g/dL 7.6 - -  Total Bilirubin 0.3 - 1.2 mg/dL 0.9 - 1.8(H)  Alkaline Phos 39 - 117 U/L 67 - -  AST 0 - 37 U/L 16 - -  ALT 0 - 53 U/L 12 - -          06/2012 EGD/Colonoscopy: Impression:  Mild portal gastropathy but no evidence of esophageal or gastric varices.  Normal colonoscopy except external hemorrhoids and small rectal lipoma.  Suspect rectal bleeding secondary to hemorrhoids in the setting of anticoagulation.  Cholestasis most likely secondary to congestive hepatopathy. He could also have cardiac cirrhosis but liver into her does not suggest advanced cirrhosis.  Hepatic Function Panel       AFP 4.6    US abdomen 03/12/2012:  Mild amt of ascites seen in the rt upper quadrant. Markedly thickened gallbladder wall without evidence of gallstone and by  report a negative sonographic Murphy's sign. GB wall measure 8.30mm in thickness. This could be factitious wall thickening due to hypoproteinemia or other underlying medical conditions, though chronic cholecystitis cannot be excluded if suspected clinically. Echogenic liver suggesting fatty infiltration or other form of intrinsic liver disease. No focal liver lesion or bile duct dilatation is noted. Mildly dilated and ectatic pancreatic duct, but otherwise sonographically unremarkable appearing pancreas. Increased echogenicity of renal parenchyma suggesting underlying medical renal disease.  05/2012 Ct abdomen/pelvis with CM: 1. Moderate to large amount of ascites, diffuse body wall edema,  and tiny right pleural effusion.  2. Cardiomegaly and signs of right heart insufficiency.  3. Hepatic cirrhosis. No evidence of neoplasm or splenomegaly  Echo 11/2011: EF of 20%         Review of Systems Past Medical History  Diagnosis Date  . Essential hypertension   . Myocardial infarction acute ([place])   . Ischemic cardiomyopathy     EF 20% by echo 2013  . Other pulmonary embolism and infarction     on coumadin  . Hyperlipidemia   . DOE (dyspnea on exertion)   . CAD (coronary artery disease)   . CVA (cerebral infarction)   . Anxiety   .  Tricuspid regurgitation     Severe  . Pulmonary hypertension (H[facility])     56 mm Hg  . Cirrhosis [facility])     Past Surgical History  Procedure Laterality Date  . Coronary artery bypass graft  2003    4 vessel  . Cardiac defibrillator placement  11/09/06    SJM Epic II DR ICD implanted in [place], atrial lead no longer functions due to low impedance  . Implantable cardioverter defibrillator generator change  05/27/2012    Gen change.  SJM Assura VR.  Riata V lead  . Colonoscopy with esophagogastroduodenoscopy (egd)  06/06/2012    Procedure: COLONOSCOPY WITH ESOPHAGOGASTRODUODENOSCOPY (EGD);  Surgeon: Rogene Houston, MD;  [facility]: [facility];  Service:  Endoscopy;  Laterality: N/A;  310  . Implantable cardioverter defibrillator (icd) generator change N/A 05/27/2012    Procedure: ICD GENERATOR CHANGE;  Surgeon: Thompson Grayer, MD;  Location: [facility] CATH LAB;  Service: Cardiovascular;  Laterality: N/A;    No Known Allergies  Current Outpatient Prescriptions on File Prior to Visit  Medication Sig Dispense Refill  . aspirin 81 MG tablet Take 81 mg by mouth as needed.     . carvedilol (COREG) 3.125 MG tablet Take 1 tablet (3.125 mg total) by mouth 2 (two) times daily. 180 tablet 3  . furosemide (LASIX) 80 MG tablet Take 1 tablet (80 mg total) by mouth daily. As needed for swelling 90 tablet 3  . KLOR-CON M20 20 MEQ tablet TAKE ONE TABLET BY MOUTH ONCE DAILY 30 tablet 6  . lisinopril (PRINIVIL,ZESTRIL) 5 MG tablet Take 1 tablet (5 mg total) by mouth daily. 90 tablet 3  . magnesium oxide (MAG-OX) 400 MG tablet Take 400 mg by mouth daily.    [name] oxyCODONE-acetaminophen (PERCOCET/ROXICET) 5-325 MG tablet Take 1 tablet by mouth every 6 (six) hours as needed for severe pain. Reported on 07/27/2015    . spironolactone (ALDACTONE) 25 MG tablet Take 1 tablet (25 mg total) by mouth daily. (Patient taking differently: Take 25 mg by mouth daily. Take 3 tablets once daily)    . warfarin (COUMADIN) 3 MG tablet Take 2 tablets daily or as directed 180 tablet 3   No current facility-administered medications on file prior to visit.        Objective:   Physical Exam Blood pressure 146/70, pulse 64, temperature 98 F (36.7 C), height 5\' 11"  (1.803 m), weight 187 lb (84.823 kg).  Alert and oriented. Skin warm and dry. Oral mucosa is moist.   . Sclera anicteric, conjunctivae is pink. Thyroid not enlarged. No cervical lymphadenopathy. Lungs clear. Heart regular rate and rhythm.  Abdomen is soft. Bowel sounds are positive. No hepatomegaly. No abdominal masses felt. No tenderness. 2-3+ edema to lower extremities.         Assessment & Plan:      . History of ascites  and cholestasis secondary to congestive hepatopathy due to underlying ischemic cardiomyopathy. There has been no weight gain. No abdominal distention. Will get a CBC, Hepatic function and PT/INR, US abdomen.  OV in 6 months.

## 2015-07-30 ENCOUNTER — Ambulatory Visit (HOSPITAL_COMMUNITY)
Admission: RE | Admit: 2015-07-30 | Discharge: 2015-07-30 | Disposition: A | Discharge status: Home / Self Care | Payer: Medicare Other | Source: Ambulatory Visit | Attending: Internal Medicine | Admitting: Internal Medicine

## 2015-07-30 DIAGNOSIS — K7030 Alcoholic cirrhosis of liver without ascites: Secondary | ICD-10-CM | POA: Insufficient documentation

## 2015-07-30 DIAGNOSIS — K7460 Unspecified cirrhosis of liver: Secondary | ICD-10-CM | POA: Diagnosis not present

## 2015-08-04 DIAGNOSIS — I110 Hypertensive heart disease with heart failure: Secondary | ICD-10-CM | POA: Diagnosis not present

## 2015-08-04 DIAGNOSIS — R1030 Lower abdominal pain, unspecified: Secondary | ICD-10-CM | POA: Diagnosis not present

## 2015-08-04 DIAGNOSIS — N390 Urinary tract infection, site not specified: Secondary | ICD-10-CM | POA: Diagnosis not present

## 2015-08-04 DIAGNOSIS — I252 Old myocardial infarction: Secondary | ICD-10-CM | POA: Diagnosis not present

## 2015-08-04 DIAGNOSIS — Z7982 Long term (current) use of aspirin: Secondary | ICD-10-CM | POA: Diagnosis not present

## 2015-08-04 DIAGNOSIS — I509 Heart failure, unspecified: Secondary | ICD-10-CM | POA: Diagnosis not present

## 2015-08-04 DIAGNOSIS — Z7901 Long term (current) use of anticoagulants: Secondary | ICD-10-CM | POA: Diagnosis not present

## 2015-08-04 DIAGNOSIS — R300 Dysuria: Secondary | ICD-10-CM | POA: Diagnosis not present

## 2015-08-04 DIAGNOSIS — R319 Hematuria, unspecified: Secondary | ICD-10-CM | POA: Diagnosis not present

## 2015-08-04 DIAGNOSIS — Z87891 Personal history of nicotine dependence: Secondary | ICD-10-CM | POA: Diagnosis not present

## 2015-08-04 DIAGNOSIS — Z79899 Other long term (current) drug therapy: Secondary | ICD-10-CM | POA: Diagnosis not present

## 2015-08-04 DIAGNOSIS — Z95810 Presence of automatic (implantable) cardiac defibrillator: Secondary | ICD-10-CM | POA: Diagnosis not present

## 2015-08-05 ENCOUNTER — Telehealth (INDEPENDENT_AMBULATORY_CARE_PROVIDER_SITE_OTHER): Payer: Self-pay | Admitting: Internal Medicine

## 2015-08-05 DIAGNOSIS — N390 Urinary tract infection, site not specified: Secondary | ICD-10-CM | POA: Diagnosis not present

## 2015-08-05 DIAGNOSIS — N401 Enlarged prostate with lower urinary tract symptoms: Secondary | ICD-10-CM | POA: Diagnosis not present

## 2015-08-05 DIAGNOSIS — K828 Other specified diseases of gallbladder: Secondary | ICD-10-CM

## 2015-08-05 NOTE — Telephone Encounter (Signed)
I have spoken with patient 

## 2015-08-06 NOTE — Telephone Encounter (Signed)
appt sch'd 09/06/15 at 830, patient aware

## 2015-08-09 DIAGNOSIS — N390 Urinary tract infection, site not specified: Secondary | ICD-10-CM | POA: Diagnosis not present

## 2015-08-17 ENCOUNTER — Encounter: Payer: Self-pay | Admitting: Cardiology

## 2015-08-17 ENCOUNTER — Ambulatory Visit (INDEPENDENT_AMBULATORY_CARE_PROVIDER_SITE_OTHER): Payer: Medicare Other | Admitting: *Deleted

## 2015-08-17 ENCOUNTER — Ambulatory Visit (INDEPENDENT_AMBULATORY_CARE_PROVIDER_SITE_OTHER): Payer: Medicare Other | Admitting: Cardiology

## 2015-08-17 ENCOUNTER — Encounter: Payer: Self-pay | Admitting: *Deleted

## 2015-08-17 VITALS — BP 99/67 | HR 79 | Ht 71.0 in | Wt 193.0 lb

## 2015-08-17 DIAGNOSIS — I4891 Unspecified atrial fibrillation: Secondary | ICD-10-CM

## 2015-08-17 DIAGNOSIS — I255 Ischemic cardiomyopathy: Secondary | ICD-10-CM | POA: Diagnosis not present

## 2015-08-17 DIAGNOSIS — G4733 Obstructive sleep apnea (adult) (pediatric): Secondary | ICD-10-CM

## 2015-08-17 DIAGNOSIS — E785 Hyperlipidemia, unspecified: Secondary | ICD-10-CM

## 2015-08-17 DIAGNOSIS — I10 Essential (primary) hypertension: Secondary | ICD-10-CM

## 2015-08-17 DIAGNOSIS — Z5181 Encounter for therapeutic drug level monitoring: Secondary | ICD-10-CM

## 2015-08-17 DIAGNOSIS — I5023 Acute on chronic systolic (congestive) heart failure: Secondary | ICD-10-CM | POA: Diagnosis not present

## 2015-08-17 LAB — POCT INR: INR: 2.3

## 2015-08-17 MED ORDER — TORSEMIDE 20 MG PO TABS: 20.0000 mg | ORAL_TABLET | Freq: Two times a day (BID) | ORAL | Status: DC

## 2015-08-17 NOTE — Progress Notes (Addendum)
Patient ID: MATI STELLWAGEN, male   DOB: 10-10-1945, 70 y.o.   MRN: [id number]     Clinical Summary Mr. Ruedas is a 70 y.o.male seen today for follow up of the following medical problems.   1. ICM/Chronic systolic HF - hx of prior CABG in 2003 as well as previous stenting  - 11/2011 echo <20%, severe RV dysfunction, severe TR, PASP 56.  - 02/2015 echo LVEF 25%, restrictive diastolic dysfunction, mod RV dysfunction, PASP 64 - ICD followed by Dr Rayann Heman, normal check 02/2015 - medical therapy has been limited by low blood pressures and orthostatic symptoms. History of prior orthostatic syncope  - limiting salt intake, avoiding NSAIDs.  -increased SOB recently. Home weights 188 lbs. Clinic scale 178 up to 193 lbs. He is taking laisx daily.  - no orthopnea. Limiting sodium intake.    2. HTN  - compliant with meds  - does not check regularly at home   3. Hx of PE  - on coumadin, occasional nosebleeds but no other bleeding   4. OSA  - reports abnormal sleep study several years ago. Did not tolerate CPAPand has not been wearing.   5. COPD - difficulty affording inhalers, has not been taking.   6. Tricuspid regurgitation - moderate by last echo 02/2015 - reports increased LE edema recently  7. Hyperlipidemia - has not been on statin due to history of NASH  8. Gallbladder thickening - has referral to surgery pending, may require surgery at some point   Past Medical History  Diagnosis Date  . Essential hypertension   . Myocardial infarction acute ([place])   . Ischemic cardiomyopathy     EF 20% by echo 2013  . Other pulmonary embolism and infarction     on coumadin  . Hyperlipidemia   . DOE (dyspnea on exertion)   . CAD (coronary artery disease)   . CVA (cerebral infarction)   . Anxiety   . Tricuspid regurgitation     Severe  . Pulmonary hypertension (HCC)     56 mm Hg  . Cirrhosis ([place])      No Known Allergies   Current Outpatient Prescriptions  Medication Sig  Dispense Refill  . aspirin 81 MG tablet Take 81 mg by mouth as needed.     . carvedilol (COREG) 3.125 MG tablet Take 1 tablet (3.125 mg total) by mouth 2 (two) times daily. 180 tablet 3  . furosemide (LASIX) 80 MG tablet Take 1 tablet (80 mg total) by mouth daily. As needed for swelling 90 tablet 3  . KLOR-CON M20 20 MEQ tablet TAKE ONE TABLET BY MOUTH ONCE DAILY 30 tablet 6  . lisinopril (PRINIVIL,ZESTRIL) 5 MG tablet Take 1 tablet (5 mg total) by mouth daily. 90 tablet 3  . magnesium oxide (MAG-OX) 400 MG tablet Take 400 mg by mouth daily.    [name] oxyCODONE-acetaminophen (PERCOCET/ROXICET) 5-325 MG tablet Take 1 tablet by mouth every 6 (six) hours as needed for severe pain. Reported on 07/27/2015    . polyethylene glycol (MIRALAX / GLYCOLAX) packet Take 17 g by mouth daily as needed.    [name] spironolactone (ALDACTONE) 25 MG tablet Take 1 tablet (25 mg total) by mouth daily. (Patient taking differently: Take 25 mg by mouth daily. Take 3 tablets once daily)    . traMADol (ULTRAM) 50 MG tablet Take by mouth every 6 (six) hours as needed.    . warfarin (COUMADIN) 3 MG tablet Take 2 tablets daily or as directed 180 tablet 3  No current facility-administered medications for this visit.     Past Surgical History  Procedure Laterality Date  . Coronary artery bypass graft  2003    4 vessel  . Cardiac defibrillator placement  11/09/06    SJM Epic II DR ICD implanted in [place], atrial lead no longer functions due to low impedance  . Implantable cardioverter defibrillator generator change  05/27/2012    Gen change.  SJM Assura VR.  Riata V lead  . Colonoscopy with esophagogastroduodenoscopy (egd)  06/06/2012    Procedure: COLONOSCOPY WITH ESOPHAGOGASTRODUODENOSCOPY (EGD);  Surgeon: Rogene Houston, MD;  [facility];  Service: Endoscopy;  Laterality: N/A;  310  . Implantable cardioverter defibrillator (icd) generator change N/A 05/27/2012    Procedure: ICD GENERATOR CHANGE;  Surgeon: Thompson Grayer, MD;  Location: [facility] CATH LAB;  Service: Cardiovascular;  Laterality: N/A;     No Known Allergies    Family History  Problem Relation Age of Onset  . Heart disease Mother     CAD  . Leukemia Father      Social History Mr. Salzberg reports that he quit smoking about 20 years ago. His smoking use included Cigarettes. He started smoking about 58 years ago. He has a 19 pack-year smoking history. He has never used smokeless tobacco. Mr. Alfaro reports that he does not drink alcohol.   Review of Systems CONSTITUTIONAL: No weight loss, fever, chills, weakness or fatigue.  HEENT: Eyes: No visual loss, blurred vision, double vision or yellow sclerae.No hearing loss, sneezing, congestion, runny nose or sore throat.  SKIN: No rash or itching.  CARDIOVASCULAR: per HPI RESPIRATORY: +SOB GASTROINTESTINAL: No anorexia, nausea, vomiting or diarrhea. No abdominal pain or blood.  GENITOURINARY: No burning on urination, no polyuria NEUROLOGICAL: No headache, dizziness, syncope, paralysis, ataxia, numbness or tingling in the extremities. No change in bowel or bladder control.  MUSCULOSKELETAL: No muscle, back pain, joint pain or stiffness.  LYMPHATICS: No enlarged nodes. No history of splenectomy.  PSYCHIATRIC: No history of depression or anxiety.  ENDOCRINOLOGIC: No reports of sweating, cold or heat intolerance. No polyuria or polydipsia.  [name]   Physical Examination Filed Vitals:   08/17/15 1546  BP: 99/67  Pulse: 79   Filed Vitals:   08/17/15 1546  Height: 5\' 11"  (1.803 m)  Weight: 193 lb (87.544 kg)    Gen: resting comfortably, no acute distress HEENT: no scleral icterus, pupils equal round and reactive, no palptable cervical adenopathy,  CV: RRR, 3/6 systolic murmur LLSB, JVD to ear lobe Resp: Clear to auscultation bilaterally GI: abdomen is soft, non-tender, non-distended, normal bowel sounds, no hepatosplenomegaly MSK: extremities are warm, no edema.  Skin: warm, no  rash Neuro:  no focal deficits Psych: appropriate affect   Diagnostic Studies 11/2011 Echo  LVEF <20%, restrictive diastolic dysfunction, severe RV systolic dysfunction,   Q000111Q ABI Right 1 Left 1.1  12/2014 PFTs Mild COPD  02/2015 echo Study Conclusions  - Left ventricle: The cavity size was severely dilated. Wall thickness was normal. Systolic function was severely reduced. The estimated ejection fraction was 25%. There is akinesis of the apicalanteroseptal and anterolateral myocardium. There is akinesis of the basalinferior myocardium. Doppler parameters are consistent with restrictive physiology, indicative of decreased left ventricular diastolic compliance and/or increased left atrial pressure. - Aortic valve: Moderately calcified annulus. Trileaflet; mildly calcified leaflets. There was trivial regurgitation. - Mitral valve: Mildly thickened leaflets . There was mild regurgitation. - Left atrium: The atrium was moderately dilated. - Right ventricle: The  cavity size was moderately dilated. Pacer wire or catheter noted in right ventricle. Systolic function was mildly to moderately reduced. - Right atrium: The atrium was severely dilated. - Tricuspid valve: There was moderate regurgitation. - Pulmonary arteries: Systolic pressure was severely increased. PA peak pressure: 64 mm Hg (S). - Pericardium, extracardiac: There was no pericardial effusion.  Impressions:  - Severe LV chamber dilatation with LVEF approximately 25%, wall motion abnormalities consistent with ischemic cardiomyopathy. Restrictive diastolic filling pattern. Moderate left atrial enlargement. Mild mitral regurgitation. Sclerotic aortic valve with trivial aortic regurgitation. Moderate RV dysfunction. Device wire present in right heart. Moderate tricuspid regurgitation with severe pulmonary hypertension, PASP 64 mmHg. Severe right atrial  enlargement.    08/17/15 Clinic EKG SR, RBBB  Assessment and Plan  1. ICM/ Acute on Chronic systolic HF - LVEF 99991111, he is NYHA II-III, he has an ICD  - evidence of severe volume overload. We will stop lasix and start torsemide 20mg  bid. Repeat BMET and Mg in 2 weeks. He will check daily weights and call us Friday with update.    2. HTN  - at goal, continue current meds   3. Hx of PE  - continue coumadin  4. OSA - per his report, states he never was compliant with CPAP due to discomfort - he remains resistant to repeat sleep evalatuion  5. Hyperlipidemia - continue statin - request labs from Dr Nevada Crane   F/u 2 weeks Arnoldo Lenis, M.D.

## 2015-08-17 NOTE — Patient Instructions (Addendum)
Your physician recommends that you schedule a follow-up appointment in: 2-3 [place] DR. Aurelia  Your physician has recommended you make the following change in your medication:   STOP LASIX  START TORSEMIDE 20 MG TWICE DAILY  Your physician recommends that you return for lab work IN 1 WEEK BMP/MG  PLEASE CALL us Friday WITH AN UPDATE ON YOUR WEIGHT  Thank you for choosing [facility]!!

## 2015-08-25 DIAGNOSIS — R600 Localized edema: Secondary | ICD-10-CM | POA: Diagnosis not present

## 2015-08-25 DIAGNOSIS — N390 Urinary tract infection, site not specified: Secondary | ICD-10-CM | POA: Diagnosis not present

## 2015-08-31 ENCOUNTER — Ambulatory Visit (INDEPENDENT_AMBULATORY_CARE_PROVIDER_SITE_OTHER): Payer: Medicare Other | Admitting: *Deleted

## 2015-08-31 DIAGNOSIS — I255 Ischemic cardiomyopathy: Secondary | ICD-10-CM

## 2015-08-31 DIAGNOSIS — Z95810 Presence of automatic (implantable) cardiac defibrillator: Secondary | ICD-10-CM

## 2015-08-31 NOTE — Progress Notes (Signed)
Remote ICD transmission.   

## 2015-09-01 ENCOUNTER — Ambulatory Visit (INDEPENDENT_AMBULATORY_CARE_PROVIDER_SITE_OTHER): Payer: Medicare Other | Admitting: Cardiology

## 2015-09-01 ENCOUNTER — Encounter: Payer: Self-pay | Admitting: Cardiology

## 2015-09-01 ENCOUNTER — Telehealth: Payer: Self-pay | Admitting: Cardiology

## 2015-09-01 VITALS — BP 101/68 | HR 74 | Ht 71.0 in | Wt 181.0 lb

## 2015-09-01 DIAGNOSIS — Z950 Presence of cardiac pacemaker: Secondary | ICD-10-CM | POA: Diagnosis not present

## 2015-09-01 DIAGNOSIS — I255 Ischemic cardiomyopathy: Secondary | ICD-10-CM | POA: Diagnosis not present

## 2015-09-01 DIAGNOSIS — Z951 Presence of aortocoronary bypass graft: Secondary | ICD-10-CM | POA: Diagnosis not present

## 2015-09-01 DIAGNOSIS — R0602 Shortness of breath: Secondary | ICD-10-CM

## 2015-09-01 DIAGNOSIS — I5022 Chronic systolic (congestive) heart failure: Secondary | ICD-10-CM

## 2015-09-01 NOTE — Progress Notes (Signed)
Patient ID: Rick [name], male   DOB: 1946/05/23, 70 y.o.   MRN: [id number]     Clinical Summary Rick [name] is a 70 y.o.male seen today for follow up of the following medical problems. This is a focused visit on his history of CHF   1. ICM/Chronic systolic HF - hx of prior CABG in 2003 as well as previous stenting  - 11/2011 echo <20%, severe RV dysfunction, severe TR, PASP 56.  - 02/2015 echo LVEF 25%, restrictive diastolic dysfunction, mod RV dysfunction, PASP 64 - ICD followed by Dr Rayann Heman, normal check 02/2015 - medical therapy has been limited by low blood pressures and orthostatic symptoms. History of prior orthostatic syncope  - limiting salt intake, avoiding NSAIDs.    - last visit evidence of severe volume overload, we stopped lasix and started torsemide 20mg  bid. Weight is down 12 lbs but our scales.  - still short SOB, still with some swelling. Notes new mildly productive cough since last visit.    Past Medical History  Diagnosis Date  . Essential hypertension   . Myocardial infarction acute ([place])   . Ischemic cardiomyopathy     EF 20% by echo 2013  . Other pulmonary embolism and infarction     on coumadin  . Hyperlipidemia   . DOE (dyspnea on exertion)   . CAD (coronary artery disease)   . CVA (cerebral infarction)   . Anxiety   . Tricuspid regurgitation     Severe  . Pulmonary hypertension (HCC)     56 mm Hg  . Cirrhosis ([place])      No Known Allergies   Current Outpatient Prescriptions  Medication Sig Dispense Refill  . aspirin 81 MG tablet Take 81 mg by mouth as needed.     . carvedilol (COREG) 3.125 MG tablet Take 1 tablet (3.125 mg total) by mouth 2 (two) times daily. 180 tablet 3  . KLOR-CON M20 20 MEQ tablet TAKE ONE TABLET BY MOUTH ONCE DAILY (Patient taking differently: TAKE ONE TABLET BY MOUTH ONCE DAILY as needed) 30 tablet 6  . lisinopril (PRINIVIL,ZESTRIL) 5 MG tablet Take 1 tablet (5 mg total) by mouth daily. 90 tablet 3  . magnesium oxide  (MAG-OX) 400 MG tablet Take 400 mg by mouth daily.    [name] oxyCODONE-acetaminophen (PERCOCET/ROXICET) 5-325 MG tablet Take 1 tablet by mouth every 6 (six) hours as needed for severe pain. Reported on 07/27/2015    . polyethylene glycol (MIRALAX / GLYCOLAX) packet Take 17 g by mouth daily as needed.    [name] spironolactone (ALDACTONE) 25 MG tablet Take 75 mg by mouth every evening.    . torsemide (DEMADEX) 20 MG tablet Take 1 tablet (20 mg total) by mouth 2 (two) times daily. 60 tablet 3  . traMADol (ULTRAM) 50 MG tablet Take by mouth every 6 (six) hours as needed.    . warfarin (COUMADIN) 3 MG tablet Take 2 tablets daily or as directed 180 tablet 3   No current facility-administered medications for this visit.     Past Surgical History  Procedure Laterality Date  . Coronary artery bypass graft  2003    4 vessel  . Cardiac defibrillator placement  11/09/06    SJM Epic II DR ICD implanted in [place], atrial lead no longer functions due to low impedance  . Implantable cardioverter defibrillator generator change  05/27/2012    Gen change.  SJM Assura VR.  Riata V lead  . Colonoscopy with esophagogastroduodenoscopy (egd)  06/06/2012  Procedure: COLONOSCOPY WITH ESOPHAGOGASTRODUODENOSCOPY (EGD);  Surgeon: Rogene Houston, MD;  [facility];  Service: Endoscopy;  Laterality: N/A;  310  . Implantable cardioverter defibrillator (icd) generator change N/A 05/27/2012    Procedure: ICD GENERATOR CHANGE;  Surgeon: Thompson Grayer, MD;  Location: [facility] CATH LAB;  Service: Cardiovascular;  Laterality: N/A;     No Known Allergies    Family History  Problem Relation Age of Onset  . Heart disease Mother     CAD  . Leukemia Father      Social History Rick [name] reports that he quit smoking about 20 years ago. His smoking use included Cigarettes. He started smoking about 58 years ago. He has a 19 pack-year smoking history. He has never used smokeless tobacco. Rick [name] reports that he does not  drink alcohol.   Review of Systems CONSTITUTIONAL: No weight loss, fever, chills, weakness or fatigue.  HEENT: Eyes: No visual loss, blurred vision, double vision or yellow sclerae.No hearing loss, sneezing, congestion, runny nose or sore throat.  SKIN: No rash or itching.  CARDIOVASCULAR: per HPI RESPIRATORY: +cough, +SOB  GASTROINTESTINAL: No anorexia, nausea, vomiting or diarrhea. No abdominal pain or blood.  GENITOURINARY: No burning on urination, no polyuria NEUROLOGICAL: No headache, dizziness, syncope, paralysis, ataxia, numbness or tingling in the extremities. No change in bowel or bladder control.  MUSCULOSKELETAL: No muscle, back pain, joint pain or stiffness.  LYMPHATICS: No enlarged nodes. No history of splenectomy.  PSYCHIATRIC: No history of depression or anxiety.  ENDOCRINOLOGIC: No reports of sweating, cold or heat intolerance. No polyuria or polydipsia.  [name]   Physical Examination Filed Vitals:   09/01/15 1138  BP: 101/68  Pulse: 74   Filed Vitals:   09/01/15 1138  Height: 5\' 11"  (1.803 m)  Weight: 181 lb (82.101 kg)    Gen: resting comfortably, no acute distress HEENT: no scleral icterus, pupils equal round and reactive, no palptable cervical adenopathy,  CV: RRR, 3/6 systolic murmur LLSB, +JVD Resp: Clear to auscultation bilaterally GI: abdomen is soft, non-tender, non-distended, normal bowel sounds, no hepatosplenomegaly MSK: extremities are warm, no edema.  Skin: warm, no rash Neuro:  no focal deficits Psych: appropriate affect   Diagnostic Studies 11/2011 Echo  LVEF <20%, restrictive diastolic dysfunction, severe RV systolic dysfunction,   Q000111Q ABI Right 1 Left 1.1  12/2014 PFTs Mild COPD  02/2015 echo Study Conclusions  - Left ventricle: The cavity size was severely dilated. Wall thickness was normal. Systolic function was severely reduced. The estimated ejection fraction was 25%. There is akinesis of the apicalanteroseptal and  anterolateral myocardium. There is akinesis of the basalinferior myocardium. Doppler parameters are consistent with restrictive physiology, indicative of decreased left ventricular diastolic compliance and/or increased left atrial pressure. - Aortic valve: Moderately calcified annulus. Trileaflet; mildly calcified leaflets. There was trivial regurgitation. - Mitral valve: Mildly thickened leaflets . There was mild regurgitation. - Left atrium: The atrium was moderately dilated. - Right ventricle: The cavity size was moderately dilated. Pacer wire or catheter noted in right ventricle. Systolic function was mildly to moderately reduced. - Right atrium: The atrium was severely dilated. - Tricuspid valve: There was moderate regurgitation. - Pulmonary arteries: Systolic pressure was severely increased. PA peak pressure: 64 mm Hg (S). - Pericardium, extracardiac: There was no pericardial effusion.  Impressions:  - Severe LV chamber dilatation with LVEF approximately 25%, wall motion abnormalities consistent with ischemic cardiomyopathy. Restrictive diastolic filling pattern. Moderate left atrial enlargement. Mild mitral regurgitation. Sclerotic aortic valve with  trivial aortic regurgitation. Moderate RV dysfunction. Device wire present in right heart. Moderate tricuspid regurgitation with severe pulmonary hypertension, PASP 64 mmHg. Severe right atrial enlargement.    08/17/15 Clinic EKG SR, RBBB    Assessment and Plan   1. ICM/ Acute on Chronic systolic HF - LVEF 99991111, he is NYHA III, he has an ICD  - evidence of severe volume overload. Somewhat improved after changing to toresmide but not resolved. We will continue current dose of torsemide, good net diuresis based on weights - repeat labs - with new cough will check CXR.   F/u 2 weeks        Arnoldo Lenis, M.D.

## 2015-09-01 NOTE — Patient Instructions (Signed)
Your physician recommends that you schedule a follow-up appointment in: 2 WEEKS WITH DR. Custer  Your physician recommends that you continue on your current medications as directed. Please refer to the Current Medication list given to you today.  A chest x-ray takes a picture of the organs and structures inside the chest, including the heart, lungs, and blood vessels. This test can show several things, including, whether the heart is enlarges; whether fluid is building up in the lungs; and whether pacemaker / defibrillator leads are still in place.  Your physician recommends that you return for lab work TODAY [email or web address]  Thank you for choosing [facility]!!

## 2015-09-01 NOTE — Telephone Encounter (Signed)
OPENED IN ERROR

## 2015-09-02 ENCOUNTER — Encounter: Payer: Self-pay | Admitting: *Deleted

## 2015-09-02 ENCOUNTER — Other Ambulatory Visit: Payer: Self-pay | Admitting: *Deleted

## 2015-09-02 DIAGNOSIS — R0602 Shortness of breath: Secondary | ICD-10-CM

## 2015-09-03 ENCOUNTER — Telehealth: Payer: Self-pay | Admitting: *Deleted

## 2015-09-03 NOTE — Telephone Encounter (Signed)
-----   Message from Arnoldo Lenis, MD sent at 09/01/2015  5:02 PM EDT ----- Labs overall look ok. They do suggest his liver is filled with fluid as well, we will monitor as the fluid continues to come off  J BrancH MD

## 2015-09-03 NOTE — Telephone Encounter (Signed)
Pt daughter Marlowe Kays made aware. Routed to pcp

## 2015-09-03 NOTE — Telephone Encounter (Signed)
-----   Message from Arnoldo Lenis, MD sent at 09/02/2015  4:50 PM EDT ----- Please let pt know cxr looks good  Zandra Abts MD

## 2015-09-03 NOTE — Telephone Encounter (Signed)
Pt daughter Marlowe Kays made aware, routed to pcp

## 2015-09-06 ENCOUNTER — Other Ambulatory Visit (HOSPITAL_COMMUNITY): Payer: Self-pay | Admitting: General Surgery

## 2015-09-06 DIAGNOSIS — IMO0002 Reserved for concepts with insufficient information to code with codable children: Secondary | ICD-10-CM

## 2015-09-06 DIAGNOSIS — K828 Other specified diseases of gallbladder: Secondary | ICD-10-CM | POA: Diagnosis not present

## 2015-09-06 DIAGNOSIS — R932 Abnormal findings on diagnostic imaging of liver and biliary tract: Secondary | ICD-10-CM | POA: Diagnosis not present

## 2015-09-06 DIAGNOSIS — R229 Localized swelling, mass and lump, unspecified: Principal | ICD-10-CM

## 2015-09-15 ENCOUNTER — Ambulatory Visit (INDEPENDENT_AMBULATORY_CARE_PROVIDER_SITE_OTHER): Payer: Medicare Other | Admitting: Cardiology

## 2015-09-15 ENCOUNTER — Ambulatory Visit (HOSPITAL_COMMUNITY)
Admission: RE | Admit: 2015-09-15 | Discharge: 2015-09-15 | Disposition: A | Discharge status: Home / Self Care | Payer: Medicare Other | Source: Ambulatory Visit | Attending: General Surgery | Admitting: General Surgery

## 2015-09-15 ENCOUNTER — Ambulatory Visit (INDEPENDENT_AMBULATORY_CARE_PROVIDER_SITE_OTHER): Payer: Medicare Other | Admitting: *Deleted

## 2015-09-15 ENCOUNTER — Encounter: Payer: Self-pay | Admitting: Cardiology

## 2015-09-15 VITALS — BP 102/60 | HR 77 | Ht 71.0 in | Wt 179.0 lb

## 2015-09-15 DIAGNOSIS — I5023 Acute on chronic systolic (congestive) heart failure: Secondary | ICD-10-CM | POA: Diagnosis not present

## 2015-09-15 DIAGNOSIS — J90 Pleural effusion, not elsewhere classified: Secondary | ICD-10-CM | POA: Diagnosis not present

## 2015-09-15 DIAGNOSIS — R188 Other ascites: Secondary | ICD-10-CM | POA: Insufficient documentation

## 2015-09-15 DIAGNOSIS — K828 Other specified diseases of gallbladder: Secondary | ICD-10-CM | POA: Diagnosis not present

## 2015-09-15 DIAGNOSIS — IMO0002 Reserved for concepts with insufficient information to code with codable children: Secondary | ICD-10-CM

## 2015-09-15 DIAGNOSIS — I255 Ischemic cardiomyopathy: Secondary | ICD-10-CM

## 2015-09-15 DIAGNOSIS — I2510 Atherosclerotic heart disease of native coronary artery without angina pectoris: Secondary | ICD-10-CM | POA: Insufficient documentation

## 2015-09-15 DIAGNOSIS — Z79899 Other long term (current) drug therapy: Secondary | ICD-10-CM | POA: Diagnosis not present

## 2015-09-15 DIAGNOSIS — Z5181 Encounter for therapeutic drug level monitoring: Secondary | ICD-10-CM | POA: Diagnosis not present

## 2015-09-15 DIAGNOSIS — R229 Localized swelling, mass and lump, unspecified: Secondary | ICD-10-CM

## 2015-09-15 DIAGNOSIS — I4891 Unspecified atrial fibrillation: Secondary | ICD-10-CM

## 2015-09-15 LAB — POCT INR: INR: 2.3

## 2015-09-15 MED ORDER — IOPAMIDOL (ISOVUE-300) INJECTION 61%
100.0000 mL | Freq: Once | INTRAVENOUS | Status: AC | PRN
  Administered 2015-09-15: 100 mL via INTRAVENOUS

## 2015-09-15 MED ORDER — TORSEMIDE 20 MG PO TABS: ORAL_TABLET | ORAL | Status: DC

## 2015-09-15 NOTE — Patient Instructions (Signed)
Medication Instructions:  INCREASE TORSEMIDE TO 40 MG IN THE MORNING, TAKE 20 MG IN THE EVENING  Labwork: Your physician recommends that you return for lab work in: [place]   Testing/Procedures: NONE  Follow-Up: Your physician recommends that you schedule a follow-up appointment in: 2-3 [place]   Any Other Special Instructions Will Be Listed Below (If Applicable).     If you need a refill on your cardiac medications before your next appointment, please call your pharmacy.

## 2015-09-15 NOTE — Progress Notes (Signed)
Quick Note:  Please let patient know that this CT did not show a gallbladder mass. Based on his risks, we will plan to hold off on surgery. ______

## 2015-09-15 NOTE — Progress Notes (Signed)
Patient ID: Rick [name], male   DOB: [birth year], 70 y.o.   MRN: [id number]     Clinical Summary Rick [name] is a 70 y.o.male seen today for follow up of the following medical problems. This is a focused visit on recent fluid overload and CHF.   1. ICM/Chronic systolic HF - hx of prior CABG in 2003 as well as previous stenting  - 11/2011 echo <20%, severe RV dysfunction, severe TR, PASP 56.  - 02/2015 echo LVEF 25%, restrictive diastolic dysfunction, mod RV dysfunction, PASP 64 - ICD followed by Dr Rayann Heman, normal check 02/2015 - medical therapy has been limited by low blood pressures and orthostatic symptoms. History of prior orthostatic syncope    - last visit evidence of severe volume overload, we stopped lasix and started torsemide 20mg  bid. Weight is down 14 lbs over the last few visits  - weight 181 to 179 lbs since last visit. Still with some LE edema, still with SOB.  - not limiting salt intake.     Past Medical History  Diagnosis Date  . Essential hypertension   . Myocardial infarction acute ([place])   . Ischemic cardiomyopathy     EF 20% by echo 2013  . Other pulmonary embolism and infarction     on coumadin  . Hyperlipidemia   . DOE (dyspnea on exertion)   . CAD (coronary artery disease)   . CVA (cerebral infarction)   . Anxiety   . Tricuspid regurgitation     Severe  . Pulmonary hypertension (HCC)     56 mm Hg  . Cirrhosis ([place])      No Known Allergies   Current Outpatient Prescriptions  Medication Sig Dispense Refill  . aspirin 81 MG tablet Take 81 mg by mouth as needed.     . carvedilol (COREG) 6.25 MG tablet Take 9.375 mg by mouth daily.    [name] KLOR-CON M20 20 MEQ tablet TAKE ONE TABLET BY MOUTH ONCE DAILY (Patient taking differently: TAKE ONE TABLET BY MOUTH ONCE DAILY as needed) 30 tablet 6  . lisinopril (PRINIVIL,ZESTRIL) 5 MG tablet Take 1 tablet (5 mg total) by mouth daily. 90 tablet 3  . polyethylene glycol (MIRALAX / GLYCOLAX) packet Take 17 g by  mouth daily as needed.    [name] spironolactone (ALDACTONE) 25 MG tablet Take 75 mg by mouth every evening.    . torsemide (DEMADEX) 20 MG tablet Take 1 tablet (20 mg total) by mouth 2 (two) times daily. 60 tablet 3  . traMADol (ULTRAM) 50 MG tablet Take by mouth every 6 (six) hours as needed.    . warfarin (COUMADIN) 3 MG tablet Take 2 tablets daily or as directed 180 tablet 3   No current facility-administered medications for this visit.     Past Surgical History  Procedure Laterality Date  . Coronary artery bypass graft  2003    4 vessel  . Cardiac defibrillator placement  11/09/06    SJM Epic II DR ICD implanted in [place], atrial lead no longer functions due to low impedance  . Implantable cardioverter defibrillator generator change  05/27/2012    Gen change.  SJM Assura VR.  Riata V lead  . Colonoscopy with esophagogastroduodenoscopy (egd)  06/06/2012    Procedure: COLONOSCOPY WITH ESOPHAGOGASTRODUODENOSCOPY (EGD);  Surgeon: Rogene Houston, MD;  Location: [facility];  Service: Endoscopy;  Laterality: N/A;  310  . Implantable cardioverter defibrillator (icd) generator change N/A 05/27/2012    Procedure: ICD GENERATOR CHANGE;  Surgeon: Jeneen Rinks  Allred, MD;  Location: [facility] CATH LAB;  Service: Cardiovascular;  Laterality: N/A;     No Known Allergies    Family History  Problem Relation Age of Onset  . Heart disease Mother     CAD  . Leukemia Father      Social History [name] reports that he quit smoking about 20 years ago. His smoking use included Cigarettes. He started smoking about 58 years ago. He has a 19 pack-year smoking history. He has never used smokeless tobacco. [name] reports that he does not drink alcohol.   Review of Systems CONSTITUTIONAL: No weight loss, fever, chills, weakness or fatigue.  HEENT: Eyes: No visual loss, blurred vision, double vision or yellow sclerae.No hearing loss, sneezing, congestion, runny nose or sore throat.  SKIN: No rash or  itching.  CARDIOVASCULAR: per HPI RESPIRATORY: No shortness of breath, cough or sputum.  GASTROINTESTINAL: No anorexia, nausea, vomiting or diarrhea. No abdominal pain or blood.  GENITOURINARY: No burning on urination, no polyuria NEUROLOGICAL: No headache, dizziness, syncope, paralysis, ataxia, numbness or tingling in the extremities. No change in bowel or bladder control.  MUSCULOSKELETAL: No muscle, back pain, joint pain or stiffness.  LYMPHATICS: No enlarged nodes. No history of splenectomy.  PSYCHIATRIC: No history of depression or anxiety.  ENDOCRINOLOGIC: No reports of sweating, cold or heat intolerance. No polyuria or polydipsia.  [name]   Physical Examination Filed Vitals:   09/15/15 1256  BP: 102/60  Pulse: 77   Filed Vitals:   09/15/15 1256  Height: 5\' 11"  (1.803 m)  Weight: 179 lb (81.194 kg)    Gen: resting comfortably, no acute distress HEENT: no scleral icterus, pupils equal round and reactive, no palptable cervical adenopathy,  CV: RRR, 3/6 systolic murmur LLSB, JVD to angle of jaw Resp: Clear to auscultation bilaterally GI: abdomen is soft, non-tender, non-distended, normal bowel sounds, no hepatosplenomegaly MSK: extremities are warm, 1+ bilateral edema Skin: warm, no rash Neuro:  no focal deficits Psych: appropriate affect   Diagnostic Studies 11/2011 Echo  LVEF <20%, restrictive diastolic dysfunction, severe RV systolic dysfunction,   Q000111Q ABI Right 1 Left 1.1  12/2014 PFTs Mild COPD  02/2015 echo Study Conclusions  - Left ventricle: The cavity size was severely dilated. Wall thickness was normal. Systolic function was severely reduced. The estimated ejection fraction was 25%. There is akinesis of the apicalanteroseptal and anterolateral myocardium. There is akinesis of the basalinferior myocardium. Doppler parameters are consistent with restrictive physiology, indicative of decreased left ventricular diastolic compliance and/or  increased left atrial pressure. - Aortic valve: Moderately calcified annulus. Trileaflet; mildly calcified leaflets. There was trivial regurgitation. - Mitral valve: Mildly thickened leaflets . There was mild regurgitation. - Left atrium: The atrium was moderately dilated. - Right ventricle: The cavity size was moderately dilated. Pacer wire or catheter noted in right ventricle. Systolic function was mildly to moderately reduced. - Right atrium: The atrium was severely dilated. - Tricuspid valve: There was moderate regurgitation. - Pulmonary arteries: Systolic pressure was severely increased. PA peak pressure: 64 mm Hg (S). - Pericardium, extracardiac: There was no pericardial effusion.  Impressions:  - Severe LV chamber dilatation with LVEF approximately 25%, wall motion abnormalities consistent with ischemic cardiomyopathy. Restrictive diastolic filling pattern. Moderate left atrial enlargement. Mild mitral regurgitation. Sclerotic aortic valve with trivial aortic regurgitation. Moderate RV dysfunction. Device wire present in right heart. Moderate tricuspid regurgitation with severe pulmonary hypertension, PASP 64 mmHg. Severe right atrial enlargement.    08/17/15 Clinic EKG SR, RBBB  Assessment and Plan  1. ICM/ Acute on Chronic systolic HF - LVEF 99991111, he is NYHA III, he has an ICD  - recent significant volume overload. He initially dropped 12 lbs after changing to torsemide, since last visit only down additional 2 lbs with some continued swelling. We will change to torsemide 40mg  in AM and 20mg  in PM.  - repeat BMET and Mg level - fluid accumulation likely multifactorial including systolic HF and cirrhosis, moderate pulm HTN with RV dysfunction as well. Likely repeat echo next visit once more euvolemic.      F/u 2-3 weeks.     Arnoldo Lenis, M.D.

## 2015-09-21 DIAGNOSIS — I10 Essential (primary) hypertension: Secondary | ICD-10-CM | POA: Diagnosis not present

## 2015-09-28 ENCOUNTER — Telehealth: Payer: Self-pay | Admitting: *Deleted

## 2015-09-28 NOTE — Telephone Encounter (Signed)
-----   Message from Arnoldo Lenis, MD sent at 09/23/2015  4:15 PM EDT ----- Labs look good  Zandra Abts MD

## 2015-09-28 NOTE — Telephone Encounter (Signed)
Pt aware, routed to pcp 

## 2015-10-04 DIAGNOSIS — E782 Mixed hyperlipidemia: Secondary | ICD-10-CM | POA: Diagnosis not present

## 2015-10-04 DIAGNOSIS — R7301 Impaired fasting glucose: Secondary | ICD-10-CM | POA: Diagnosis not present

## 2015-10-04 DIAGNOSIS — I10 Essential (primary) hypertension: Secondary | ICD-10-CM | POA: Diagnosis not present

## 2015-10-07 ENCOUNTER — Inpatient Hospital Stay (HOSPITAL_COMMUNITY)
Admission: EM | Admit: 2015-10-07 | Discharge: 2015-10-13 | DRG: 293 | Disposition: A | Discharge status: Home / Self Care | Payer: Medicare Other | Attending: Internal Medicine | Admitting: Internal Medicine

## 2015-10-07 ENCOUNTER — Ambulatory Visit (INDEPENDENT_AMBULATORY_CARE_PROVIDER_SITE_OTHER): Payer: Medicare Other | Admitting: Cardiology

## 2015-10-07 ENCOUNTER — Encounter (HOSPITAL_COMMUNITY): Payer: Self-pay | Admitting: Emergency Medicine

## 2015-10-07 ENCOUNTER — Encounter: Payer: Self-pay | Admitting: Cardiology

## 2015-10-07 ENCOUNTER — Emergency Department (HOSPITAL_COMMUNITY): Payer: Medicare Other

## 2015-10-07 VITALS — BP 107/74 | HR 80 | Ht 71.0 in | Wt 187.4 lb

## 2015-10-07 DIAGNOSIS — Z806 Family history of leukemia: Secondary | ICD-10-CM | POA: Diagnosis not present

## 2015-10-07 DIAGNOSIS — Z95810 Presence of automatic (implantable) cardiac defibrillator: Secondary | ICD-10-CM | POA: Diagnosis not present

## 2015-10-07 DIAGNOSIS — I517 Cardiomegaly: Secondary | ICD-10-CM | POA: Diagnosis not present

## 2015-10-07 DIAGNOSIS — D638 Anemia in other chronic diseases classified elsewhere: Secondary | ICD-10-CM | POA: Diagnosis present

## 2015-10-07 DIAGNOSIS — I509 Heart failure, unspecified: Secondary | ICD-10-CM | POA: Diagnosis not present

## 2015-10-07 DIAGNOSIS — R601 Generalized edema: Secondary | ICD-10-CM

## 2015-10-07 DIAGNOSIS — I5043 Acute on chronic combined systolic (congestive) and diastolic (congestive) heart failure: Secondary | ICD-10-CM | POA: Diagnosis not present

## 2015-10-07 DIAGNOSIS — Z7901 Long term (current) use of anticoagulants: Secondary | ICD-10-CM | POA: Diagnosis not present

## 2015-10-07 DIAGNOSIS — R600 Localized edema: Secondary | ICD-10-CM | POA: Diagnosis not present

## 2015-10-07 DIAGNOSIS — E785 Hyperlipidemia, unspecified: Secondary | ICD-10-CM | POA: Diagnosis not present

## 2015-10-07 DIAGNOSIS — I110 Hypertensive heart disease with heart failure: Principal | ICD-10-CM | POA: Diagnosis present

## 2015-10-07 DIAGNOSIS — I5021 Acute systolic (congestive) heart failure: Secondary | ICD-10-CM | POA: Diagnosis not present

## 2015-10-07 DIAGNOSIS — I5023 Acute on chronic systolic (congestive) heart failure: Secondary | ICD-10-CM

## 2015-10-07 DIAGNOSIS — I252 Old myocardial infarction: Secondary | ICD-10-CM | POA: Diagnosis not present

## 2015-10-07 DIAGNOSIS — K7460 Unspecified cirrhosis of liver: Secondary | ICD-10-CM | POA: Diagnosis present

## 2015-10-07 DIAGNOSIS — Z8673 Personal history of transient ischemic attack (TIA), and cerebral infarction without residual deficits: Secondary | ICD-10-CM

## 2015-10-07 DIAGNOSIS — I10 Essential (primary) hypertension: Secondary | ICD-10-CM | POA: Diagnosis not present

## 2015-10-07 DIAGNOSIS — I272 Other secondary pulmonary hypertension: Secondary | ICD-10-CM | POA: Diagnosis present

## 2015-10-07 DIAGNOSIS — Z87891 Personal history of nicotine dependence: Secondary | ICD-10-CM

## 2015-10-07 DIAGNOSIS — I255 Ischemic cardiomyopathy: Secondary | ICD-10-CM | POA: Diagnosis present

## 2015-10-07 DIAGNOSIS — D649 Anemia, unspecified: Secondary | ICD-10-CM

## 2015-10-07 DIAGNOSIS — Z951 Presence of aortocoronary bypass graft: Secondary | ICD-10-CM | POA: Diagnosis not present

## 2015-10-07 DIAGNOSIS — Z8249 Family history of ischemic heart disease and other diseases of the circulatory system: Secondary | ICD-10-CM | POA: Diagnosis not present

## 2015-10-07 DIAGNOSIS — I2510 Atherosclerotic heart disease of native coronary artery without angina pectoris: Secondary | ICD-10-CM | POA: Diagnosis not present

## 2015-10-07 DIAGNOSIS — Z86711 Personal history of pulmonary embolism: Secondary | ICD-10-CM | POA: Diagnosis not present

## 2015-10-07 LAB — COMPREHENSIVE METABOLIC PANEL
ALBUMIN: 2.8 g/dL — AB (ref 3.5–5.0)
ALT: 15 U/L — ABNORMAL LOW (ref 17–63)
AST: 33 U/L (ref 15–41)
Alkaline Phosphatase: 97 U/L (ref 38–126)
Anion gap: 10 (ref 5–15)
BUN: 26 mg/dL — AB (ref 6–20)
CHLORIDE: 101 mmol/L (ref 101–111)
CO2: 22 mmol/L (ref 22–32)
Calcium: 8.7 mg/dL — ABNORMAL LOW (ref 8.9–10.3)
Creatinine, Ser: 1.2 mg/dL (ref 0.61–1.24)
GFR calc Af Amer: >60 mL/min (ref >60)
GFR calc non Af Amer: 60 mL/min — ABNORMAL LOW (ref >60)
GLUCOSE: 144 mg/dL — AB (ref 65–99)
POTASSIUM: 4 mmol/L (ref 3.5–5.1)
Sodium: 133 mmol/L — ABNORMAL LOW (ref 135–145)
Total Bilirubin: 4.8 mg/dL — ABNORMAL HIGH (ref 0.3–1.2)
Total Protein: 8 g/dL (ref 6.5–8.1)

## 2015-10-07 LAB — PROTIME-INR
INR: 2.29 — ABNORMAL HIGH (ref 0.00–1.49)
Prothrombin Time: 25 seconds — ABNORMAL HIGH (ref 11.6–15.2)

## 2015-10-07 LAB — FERRITIN: Ferritin: 42 ng/mL (ref 24–336)

## 2015-10-07 LAB — CBC WITH DIFFERENTIAL/PLATELET
BASOS PCT: 0 %
Basophils Absolute: 0 10*3/uL (ref 0.0–0.1)
EOS ABS: 0.1 10*3/uL (ref 0.0–0.7)
Eosinophils Relative: 1 %
HEMATOCRIT: 31.4 % — AB (ref 39.0–52.0)
Hemoglobin: 9.8 g/dL — ABNORMAL LOW (ref 13.0–17.0)
LYMPHS ABS: 1 10*3/uL (ref 0.7–4.0)
Lymphocytes Relative: 18 %
MCH: 25.7 pg — AB (ref 26.0–34.0)
MCHC: 31.2 g/dL (ref 30.0–36.0)
MCV: 82.4 fL (ref 78.0–100.0)
MONO ABS: 0.9 10*3/uL (ref 0.1–1.0)
MONOS PCT: 15 %
NEUTROS ABS: 3.6 10*3/uL (ref 1.7–7.7)
Neutrophils Relative %: 66 %
Platelets: 148 10*3/uL — ABNORMAL LOW (ref 150–400)
RBC: 3.81 MIL/uL — ABNORMAL LOW (ref 4.22–5.81)
RDW: 20.3 % — AB (ref 11.5–15.5)
WBC: 5.5 10*3/uL (ref 4.0–10.5)

## 2015-10-07 LAB — RETICULOCYTES
RBC.: 3.88 MIL/uL — ABNORMAL LOW (ref 4.22–5.81)
RETIC CT PCT: 3.1 % (ref 0.4–3.1)
Retic Count, Absolute: 120.3 10*3/uL (ref 19.0–186.0)

## 2015-10-07 LAB — IRON AND TIBC
IRON: 35 ug/dL — AB (ref 45–182)
Saturation Ratios: 9 % — ABNORMAL LOW (ref 17.9–39.5)
TIBC: 409 ug/dL (ref 250–450)
UIBC: 374 ug/dL

## 2015-10-07 LAB — TROPONIN I: Troponin I: <0.03 ng/mL (ref <0.031)

## 2015-10-07 LAB — VITAMIN B12: Vitamin B-12: 689 pg/mL (ref 180–914)

## 2015-10-07 LAB — BRAIN NATRIURETIC PEPTIDE: B Natriuretic Peptide: 2346 pg/mL — ABNORMAL HIGH (ref 0.0–100.0)

## 2015-10-07 LAB — POC OCCULT BLOOD, ED: Fecal Occult Bld: NEGATIVE

## 2015-10-07 MED ORDER — FUROSEMIDE 10 MG/ML IJ SOLN
80.0000 mg | Freq: Once | INTRAMUSCULAR | Status: AC
  Administered 2015-10-07: 80 mg via INTRAVENOUS
  Filled 2015-10-07: qty 8

## 2015-10-07 MED ORDER — WARFARIN SODIUM 2 MG PO TABS
3.0000 mg | ORAL_TABLET | Freq: Once | ORAL | Status: AC
  Administered 2015-10-07: 3 mg via ORAL
  Filled 2015-10-07: qty 1

## 2015-10-07 MED ORDER — POLYETHYLENE GLYCOL 3350 17 G PO PACK
17.0000 g | PACK | Freq: Every day | ORAL | Status: DC
  Administered 2015-10-07 – 2015-10-13 (×7): 17 g via ORAL
  Filled 2015-10-07 (×7): qty 1

## 2015-10-07 MED ORDER — FUROSEMIDE 10 MG/ML IJ SOLN
80.0000 mg | Freq: Two times a day (BID) | INTRAMUSCULAR | Status: DC
  Administered 2015-10-07 – 2015-10-11 (×8): 80 mg via INTRAVENOUS
  Filled 2015-10-07 (×9): qty 8

## 2015-10-07 MED ORDER — ACETAMINOPHEN 325 MG PO TABS: 650.0000 mg | ORAL_TABLET | ORAL | Status: DC | PRN

## 2015-10-07 MED ORDER — WARFARIN - PHARMACIST DOSING INPATIENT
Status: DC
  Administered 2015-10-11 – 2015-10-12 (×2)

## 2015-10-07 MED ORDER — SODIUM CHLORIDE 0.9% FLUSH: 3.0000 mL | INTRAVENOUS | Status: DC | PRN

## 2015-10-07 MED ORDER — POTASSIUM CHLORIDE CRYS ER 20 MEQ PO TBCR
20.0000 meq | EXTENDED_RELEASE_TABLET | Freq: Every day | ORAL | Status: DC
  Administered 2015-10-07 – 2015-10-13 (×7): 20 meq via ORAL
  Filled 2015-10-07 (×7): qty 1

## 2015-10-07 MED ORDER — ONDANSETRON HCL 4 MG/2ML IJ SOLN: 4.0000 mg | Freq: Four times a day (QID) | INTRAMUSCULAR | Status: DC | PRN

## 2015-10-07 MED ORDER — LISINOPRIL 5 MG PO TABS
5.0000 mg | ORAL_TABLET | Freq: Every day | ORAL | Status: DC
  Administered 2015-10-07 – 2015-10-09 (×3): 5 mg via ORAL
  Filled 2015-10-07 (×7): qty 1

## 2015-10-07 MED ORDER — SPIRONOLACTONE 25 MG PO TABS
75.0000 mg | ORAL_TABLET | Freq: Every evening | ORAL | Status: DC
  Administered 2015-10-07 – 2015-10-10 (×4): 75 mg via ORAL
  Filled 2015-10-07 (×5): qty 3

## 2015-10-07 MED ORDER — SODIUM CHLORIDE 0.9% FLUSH
3.0000 mL | Freq: Two times a day (BID) | INTRAVENOUS | Status: DC
  Administered 2015-10-07 – 2015-10-13 (×9): 3 mL via INTRAVENOUS

## 2015-10-07 MED ORDER — SODIUM CHLORIDE 0.9 % IV SOLN: 250.0000 mL | INTRAVENOUS | Status: DC | PRN

## 2015-10-07 MED ORDER — ASPIRIN EC 81 MG PO TBEC
81.0000 mg | DELAYED_RELEASE_TABLET | Freq: Every day | ORAL | Status: DC
  Administered 2015-10-07 – 2015-10-13 (×7): 81 mg via ORAL
  Filled 2015-10-07 (×7): qty 1

## 2015-10-07 MED ORDER — CARVEDILOL 3.125 MG PO TABS
9.3750 mg | ORAL_TABLET | Freq: Every day | ORAL | Status: DC
  Administered 2015-10-08 – 2015-10-13 (×5): 9.375 mg via ORAL
  Filled 2015-10-07 (×6): qty 3

## 2015-10-07 NOTE — Patient Instructions (Signed)
Your physician recommends that you schedule a follow-up TO BE DETERMINED   PLEASE REPORT TO [facility] EMERGENCY ROOM.  Thank you for choosing [facility]!!

## 2015-10-07 NOTE — ED Provider Notes (Signed)
CSN: [id number]     Arrival date & time 10/07/15  1149 History   First MD Initiated Contact with Patient 10/07/15 1159     Chief Complaint  Patient presents with  . Leg Swelling    both     (Consider location/radiation/quality/duration/timing/severity/associated sxs/prior Treatment) Patient is a 70 y.o. male presenting with leg pain.  Leg Pain Location:  Leg Leg location:  L leg and R leg Pain details:    Quality:  Aching and pressure   Severity:  Mild   Onset quality:  Gradual   Duration:  3 weeks   Timing:  Constant   Progression:  Worsening Chronicity:  Recurrent Relieved by:  Nothing Ineffective treatments: torsemide. Associated symptoms: no back pain, no neck pain and no numbness     Past Medical History  Diagnosis Date  . Essential hypertension   . Myocardial infarction acute ([place])   . Ischemic cardiomyopathy     EF 20% by echo 2013  . Other pulmonary embolism and infarction     On coumadin  . Hyperlipidemia   . DOE (dyspnea on exertion)   . CAD (coronary artery disease)   . CVA (cerebral infarction)   . Anxiety   . Tricuspid regurgitation     Severe  . Pulmonary hypertension (HCC)     56 mm Hg  . Cirrhosis [facility])    Past Surgical History  Procedure Laterality Date  . Coronary artery bypass graft  2003    4 vessel  . Cardiac defibrillator placement  11/09/06    SJM Epic II DR ICD implanted in [place], atrial lead no longer functions due to low impedance  . Implantable cardioverter defibrillator generator change  05/27/2012    Gen change.  SJM Assura VR.  Riata V lead  . Colonoscopy with esophagogastroduodenoscopy (egd)  06/06/2012    Procedure: COLONOSCOPY WITH ESOPHAGOGASTRODUODENOSCOPY (EGD);  Surgeon: Rogene Houston, MD;  Location[facility];  Service: Endoscopy;  Laterality: N/A;  310  . Implantable cardioverter defibrillator (icd) generator change N/A 05/27/2012    Procedure: ICD GENERATOR CHANGE;  Surgeon: Thompson Grayer, MD;  [facility] CATH  LAB;  Service: Cardiovascular;  Laterality: N/A;   Family History  Problem Relation Age of Onset  . Heart disease Mother     CAD  . Leukemia Father    Social History  Substance Use Topics  . Smoking status: Former Smoker -- 0.50 packs/day for 38 years    Types: Cigarettes    Start date: 12/30/1956    Quit date: 06/06/1995  . Smokeless tobacco: Never Used  . Alcohol Use: No     Comment: Quit in 1997. Drank on the weekends    Review of Systems  Respiratory: Positive for cough and shortness of breath.   Cardiovascular: Positive for leg swelling. Negative for chest pain.  Musculoskeletal: Negative for back pain and neck pain.  All other systems reviewed and are negative.     Allergies  Review of patient's allergies indicates no known allergies.  Home Medications   Prior to Admission medications   Medication Sig Start Date End Date Taking? Authorizing Provider  carvedilol (COREG) 6.25 MG tablet Take 9.375 mg by mouth daily.   Yes Historical Provider, MD  KLOR-CON M20 20 MEQ tablet TAKE ONE TABLET BY MOUTH ONCE DAILY Patient taking differently: TAKE ONE TABLET BY MOUTH ONCE DAILY as needed 06/02/15  Yes Arnoldo Lenis, MD  lisinopril (PRINIVIL,ZESTRIL) 5 MG tablet Take 1 tablet (5 mg total) by mouth  daily. 01/18/15  Yes Arnoldo Lenis, MD  polyethylene glycol [facility] / GLYCOLAX) packet Take 17 g by mouth daily as needed.   Yes Historical Provider, MD  spironolactone (ALDACTONE) 25 MG tablet Take 75 mg by mouth every evening.   Yes Historical Provider, MD  torsemide (DEMADEX) 20 MG tablet Take 40 mg in the morning & take 20 mg in the evening 09/15/15  Yes Arnoldo Lenis, MD  traMADol (ULTRAM) 50 MG tablet Take by mouth every 6 (six) hours as needed.   Yes Historical Provider, MD  warfarin (COUMADIN) 3 MG tablet Take 2 tablets daily or as directed 06/11/15  Yes Arnoldo Lenis, MD   BP 108/77 mmHg  Pulse 75  Temp(Src) 97.4 F (36.3 C) (Oral)  Resp 21  Ht 5\' 11"   (1.803 m)  Wt 175 lb 3.2 oz (79.47 kg)  BMI 24.45 kg/m2  SpO2 100% Physical Exam  Constitutional: He is oriented to person, place, and time. He appears well-developed and well-nourished.  HENT:  Head: Normocephalic and atraumatic.  Neck: Normal range of motion.  Cardiovascular: Normal rate.   Pulmonary/Chest: Effort normal. No respiratory distress.  Abdominal: Soft. He exhibits no distension. There is no tenderness.  Musculoskeletal: Normal range of motion. He exhibits edema (BLE 2+ to just above knee).  Neurological: He is alert and oriented to person, place, and time.  Skin: Skin is warm and dry.  Nursing note and vitals reviewed.   ED Course  Procedures (including critical care time) Labs Review Labs Reviewed  CBC WITH DIFFERENTIAL/PLATELET - Abnormal; Notable for the following:    RBC 3.81 (*)    Hemoglobin 9.8 (*)    HCT 31.4 (*)    MCH 25.7 (*)    RDW 20.3 (*)    Platelets 148 (*)    All other components within normal limits  COMPREHENSIVE METABOLIC PANEL - Abnormal; Notable for the following:    Sodium 133 (*)    Glucose, Bld 144 (*)    BUN 26 (*)    Calcium 8.7 (*)    Albumin 2.8 (*)    ALT 15 (*)    Total Bilirubin 4.8 (*)    GFR calc non Af Amer 60 (*)    All other components within normal limits  BRAIN NATRIURETIC PEPTIDE - Abnormal; Notable for the following:    B Natriuretic Peptide 2346.0 (*)    All other components within normal limits  TROPONIN I  POC OCCULT BLOOD, ED    Imaging Review Dg Chest 2 View  10/07/2015  CLINICAL DATA:  Lower extremity swelling EXAM: CHEST  2 VIEW COMPARISON:  09/01/2015 chest radiograph. FINDINGS: Sternotomy wires appear aligned and intact. Stable configuration of 2 lead left subclavian ICD. Stable cardiomediastinal silhouette with mild-to-moderate cardiomegaly. No pneumothorax. No pleural effusion. No overt pulmonary edema. No acute consolidative airspace disease. IMPRESSION: Stable cardiomegaly without overt pulmonary  edema. Electronically Signed   By: Ilona Sorrel M.D.   On: 10/07/2015 12:45   I have personally reviewed and evaluated these images and lab results as part of my medical decision-making.   EKG Interpretation   Date/Time:  Thursday Oct 07 2015 12:25:35 EDT Ventricular Rate:  77 PR Interval:  193 QRS Duration: 205 QT Interval:  467 QTC Calculation: 529 R Axis:   -106 Text Interpretation:  Sinus rhythm Ventricular premature complex RBBB and  LAFB No significant change since last tracing Confirmed by Christy Gentles  MD,  DONALD ([id number]) on 10/07/2015 12:31:32 PM  MDM   Final diagnoses:  Acute on chronic systolic congestive heart failure (HCC)   Likely CHF exacerbation. H/o cirrhosis as well. Decreased UOP recently, suspect renal involvement. Will eval and treat appropriately. High likelihood he may need to be admitted.  After review of Dr. Nelly Laurence note, patient will be admitted for further workup of CHF, repeat echo 80 IV lasix given in ED, d/w triad who will admit to tele bed here at [facility].       Merrily Pew, MD 10/07/15 [phone]

## 2015-10-07 NOTE — ED Notes (Signed)
Patient presents with bilateral lower extremity edema +3 into thighs. Patient states shortness of breath, but denies chest pain. Patient also presents with jaundice to eyes and states he "has liver problems"

## 2015-10-07 NOTE — Progress Notes (Signed)
Patient ID: ADELL ESHBACH, male   DOB: Feb 23, 1946, 70 y.o.   MRN: [id number]     Clinical Summary Mr. Kidane is a 70 y.o.male seen today for follow up of the following medical problems. This is a focused visit on recent fluid overload and CHF.   1. ICM/Chronic systolic HF - hx of prior CABG in 2003 as well as previous stenting  - 11/2011 echo <20%, severe RV dysfunction, severe TR, PASP 56.  - 02/2015 echo LVEF 25%, restrictive diastolic dysfunction, mod RV dysfunction, PASP 64 - ICD followed by Dr Rayann Heman, normal check 02/2015 - medical therapy has been limited by low blood pressures and orthostatic symptoms. History of prior orthostatic syncope    - recently changed from lasix to torsemide. He initially dropped 12 lbs but last visit weight decline and leveled off and torsemide was increased to 40mg  in AM and 20mg  in PM. Since last visit by our scale is up another 8 pounds. His home scale appears to be inaccurate, he reports his weight are down to 172 lbs. Increased LE edema well above the knee, increased SOB and DOE.     Past Medical History  Diagnosis Date  . Essential hypertension   . Myocardial infarction acute ([place])   . Ischemic cardiomyopathy     EF 20% by echo 2013  . Other pulmonary embolism and infarction     on coumadin  . Hyperlipidemia   . DOE (dyspnea on exertion)   . CAD (coronary artery disease)   . CVA (cerebral infarction)   . Anxiety   . Tricuspid regurgitation     Severe  . Pulmonary hypertension (HCC)     56 mm Hg  . Cirrhosis ([place])      No Known Allergies   Current Outpatient Prescriptions  Medication Sig Dispense Refill  . aspirin 81 MG tablet Take 81 mg by mouth as needed.     . carvedilol (COREG) 6.25 MG tablet Take 9.375 mg by mouth daily.    [name] KLOR-CON M20 20 MEQ tablet TAKE ONE TABLET BY MOUTH ONCE DAILY (Patient taking differently: TAKE ONE TABLET BY MOUTH ONCE DAILY as needed) 30 tablet 6  . lisinopril (PRINIVIL,ZESTRIL) 5 MG tablet Take 1  tablet (5 mg total) by mouth daily. 90 tablet 3  . polyethylene glycol (MIRALAX / GLYCOLAX) packet Take 17 g by mouth daily as needed.    [name] spironolactone (ALDACTONE) 25 MG tablet Take 75 mg by mouth every evening.    . torsemide (DEMADEX) 20 MG tablet Take 40 mg in the morning & take 20 mg in the evening 60 tablet 3  . traMADol (ULTRAM) 50 MG tablet Take by mouth every 6 (six) hours as needed.    . warfarin (COUMADIN) 3 MG tablet Take 2 tablets daily or as directed 180 tablet 3   No current facility-administered medications for this visit.     Past Surgical History  Procedure Laterality Date  . Coronary artery bypass graft  2003    4 vessel  . Cardiac defibrillator placement  11/09/06    SJM Epic II DR ICD implanted in [place], atrial lead no longer functions due to low impedance  . Implantable cardioverter defibrillator generator change  05/27/2012    Gen change.  SJM Assura VR.  Riata V lead  . Colonoscopy with esophagogastroduodenoscopy (egd)  06/06/2012    Procedure: COLONOSCOPY WITH ESOPHAGOGASTRODUODENOSCOPY (EGD);  Surgeon: Rogene Houston, MD;  Location: [facility];  Service: Endoscopy;  Laterality: N/A;  310  . Implantable cardioverter defibrillator (icd) generator change N/A 05/27/2012    Procedure: ICD GENERATOR CHANGE;  Surgeon: Thompson Grayer, MD;  Location: [facility] CATH LAB;  Service: Cardiovascular;  Laterality: N/A;     No Known Allergies    Family History  Problem Relation Age of Onset  . Heart disease Mother     CAD  . Leukemia Father      Social History Mr. Charon reports that he quit smoking about 20 years ago. His smoking use included Cigarettes. He started smoking about 58 years ago. He has a 19 pack-year smoking history. He has never used smokeless tobacco. Mr. Hochstetler reports that he does not drink alcohol.   Review of Systems CONSTITUTIONAL: No weight loss, fever, chills, weakness or fatigue.  HEENT: Eyes: No visual loss, blurred vision, double  vision or yellow sclerae.No hearing loss, sneezing, congestion, runny nose or sore throat.  SKIN: No rash or itching.  CARDIOVASCULAR: no chest pain, no palpitations.  RESPIRATORY: +SOB GASTROINTESTINAL: No anorexia, nausea, vomiting or diarrhea. No abdominal pain or blood.  GENITOURINARY: No burning on urination, no polyuria NEUROLOGICAL: No headache, dizziness, syncope, paralysis, ataxia, numbness or tingling in the extremities. No change in bowel or bladder control.  MUSCULOSKELETAL: No muscle, back pain, joint pain or stiffness.  LYMPHATICS: No enlarged nodes. No history of splenectomy.  PSYCHIATRIC: No history of depression or anxiety.  ENDOCRINOLOGIC: No reports of sweating, cold or heat intolerance. No polyuria or polydipsia.  [name]   Physical Examination Filed Vitals:   10/07/15 1021  BP: 107/74  Pulse: 80   Filed Vitals:   10/07/15 1021  Height: 5\' 11"  (1.803 m)  Weight: 187 lb 6.4 oz (85.004 kg)    Gen: resting comfortably, no acute distress HEENT: no scleral icterus, pupils equal round and reactive, no palptable cervical adenopathy,  CV: RRR, 3/6 systolic murmur LLSB, JVD above angle of jaw Resp: Clear to auscultation bilaterally GI: abdomen is soft, non-tender, non-distended, normal bowel sounds, no hepatosplenomegaly MSK: extremities are warm, 2+ bilateral LE edema well above knee  Skin: warm, no rash Neuro:  no focal deficits Psych: appropriate affect   Diagnostic Studies 11/2011 Echo  LVEF <20%, restrictive diastolic dysfunction, severe RV systolic dysfunction,   Q000111Q ABI Right 1 Left 1.1  12/2014 PFTs Mild COPD  02/2015 echo Study Conclusions  - Left ventricle: The cavity size was severely dilated. Wall thickness was normal. Systolic function was severely reduced. The estimated ejection fraction was 25%. There is akinesis of the apicalanteroseptal and anterolateral myocardium. There is akinesis of the basalinferior myocardium. Doppler  parameters are consistent with restrictive physiology, indicative of decreased left ventricular diastolic compliance and/or increased left atrial pressure. - Aortic valve: Moderately calcified annulus. Trileaflet; mildly calcified leaflets. There was trivial regurgitation. - Mitral valve: Mildly thickened leaflets . There was mild regurgitation. - Left atrium: The atrium was moderately dilated. - Right ventricle: The cavity size was moderately dilated. Pacer wire or catheter noted in right ventricle. Systolic function was mildly to moderately reduced. - Right atrium: The atrium was severely dilated. - Tricuspid valve: There was moderate regurgitation. - Pulmonary arteries: Systolic pressure was severely increased. PA peak pressure: 64 mm Hg (S). - Pericardium, extracardiac: There was no pericardial effusion.  Impressions:  - Severe LV chamber dilatation with LVEF approximately 25%, wall motion abnormalities consistent with ischemic cardiomyopathy. Restrictive diastolic filling pattern. Moderate left atrial enlargement. Mild mitral regurgitation. Sclerotic aortic valve with trivial aortic regurgitation. Moderate RV dysfunction. Device wire present in  right heart. Moderate tricuspid regurgitation with severe pulmonary hypertension, PASP 64 mmHg. Severe right atrial enlargement.    Assessment and Plan  1. ICM/ Acute on Chronic systolic HF - LVEF 123456, he is NYHA III, he has an ICD  - recent significant volume overload. He initially dropped 12 lbs after changing to torsemide, but despite increased torsemide has significant 8 lbs weight gain since last visit and severe anasarca. He continues to eat high sodium at home including ramen noodles which we have discussed in the past about there sodium content.  - fluid accumulation likely multifactorial including systolic HF and cirrhosis, moderate pulm HTN with RV dysfunction as well.  - we will have patient  travel to [facility] ER from clinic for admission for decompensated heart failure and anasarca. I would recommend starting with lasix IV 80mg  bid, he will likely need several days of diuresis. Please repeat echocardiogram as well.            Arnoldo Lenis, M.D.

## 2015-10-07 NOTE — ED Notes (Signed)
Seen by Dr Harl Bowie at [facility] in [place].

## 2015-10-07 NOTE — H&P (Signed)
History and Physical    Rick [name] [id number] DOB: May 27, 1946 DOA: 10/07/2015  Referring MD/NP/PA: Dorise Bullion, M.D. PCP: Wende Neighbors, MD  Patient coming from: Home  Chief Complaint: Lower extremity edema  HPI: [name] is a 70 y.o. male with multiple medical comorbidities and also significant for ischemic cardiomyopathy and systolic CHF with unknown ejection fraction of less than 20% status post AICD placement, history of pulmonary embolism on Coumadin, hypertension, hyperlipidemia pulmonary hypertension among other things who presents today from the cardiologist's office for treatment of volume overload. Patient was recently switched from Lasix to torsemide and his weight initially dropped by to both pounds. Cardiology notes state that his weight decline leveled off and his torsemide was subsequently increased to 40 mg in the morning and 20 mg at night but he continues to gain weight. He states his weight has increased from 172-187. He was noted to have 3+ pitting edema up to his hips with increased shortness of breath and dyspnea on exertion as well as orthopnea and was referred to the hospital for the purpose of diuresis.   Past Medical History  Diagnosis Date  . Essential hypertension   . Myocardial infarction acute ([place])   . Ischemic cardiomyopathy     EF 20% by echo 2013  . Other pulmonary embolism and infarction     On coumadin  . Hyperlipidemia   . DOE (dyspnea on exertion)   . CAD (coronary artery disease)   . CVA (cerebral infarction)   . Anxiety   . Tricuspid regurgitation     Severe  . Pulmonary hypertension (HCC)     56 mm Hg  . Cirrhosis [facility])     Past Surgical History  Procedure Laterality Date  . Coronary artery bypass graft  2003    4 vessel  . Cardiac defibrillator placement  11/09/06    SJM Epic II DR ICD implanted in [place], atrial lead no longer functions due to low impedance  . Implantable cardioverter defibrillator generator change   05/27/2012    Gen change.  SJM Assura VR.  Riata V lead  . Colonoscopy with esophagogastroduodenoscopy (egd)  06/06/2012    Procedure: COLONOSCOPY WITH ESOPHAGOGASTRODUODENOSCOPY (EGD);  Surgeon: Rogene Houston, MD;  Location: [facility] SUITE;  Service: Endoscopy;  Laterality: N/A;  310  . Implantable cardioverter defibrillator (icd) generator change N/A 05/27/2012    Procedure: ICD GENERATOR CHANGE;  Surgeon: Thompson Grayer, MD;  Location: [facility] CATH LAB;  Service: Cardiovascular;  Laterality: N/A;     reports that he quit smoking about 20 years ago. His smoking use included Cigarettes. He started smoking about 58 years ago. He has a 19 pack-year smoking history. He has never used smokeless tobacco. He reports that he does not drink alcohol or use illicit drugs.  No Known Allergies  Family History  Problem Relation Age of Onset  . Heart disease Mother     CAD  . Leukemia Father     Prior to Admission medications   Medication Sig Start Date End Date Taking? Authorizing Provider  carvedilol (COREG) 6.25 MG tablet Take 9.375 mg by mouth daily.   Yes Historical Provider, MD  KLOR-CON M20 20 MEQ tablet TAKE ONE TABLET BY MOUTH ONCE DAILY Patient taking differently: TAKE ONE TABLET BY MOUTH ONCE DAILY as needed 06/02/15  Yes Arnoldo Lenis, MD  lisinopril (PRINIVIL,ZESTRIL) 5 MG tablet Take 1 tablet (5 mg total) by mouth daily. 01/18/15  Yes Arnoldo Lenis, MD  polyethylene glycol (  MIRALAX / GLYCOLAX) packet Take 17 g by mouth daily as needed.   Yes Historical Provider, MD  spironolactone (ALDACTONE) 25 MG tablet Take 75 mg by mouth every evening.   Yes Historical Provider, MD  torsemide (DEMADEX) 20 MG tablet Take 40 mg in the morning & take 20 mg in the evening 09/15/15  Yes Arnoldo Lenis, MD  traMADol (ULTRAM) 50 MG tablet Take by mouth every 6 (six) hours as needed.   Yes Historical Provider, MD  warfarin (COUMADIN) 3 MG tablet Take 2 tablets daily or as directed 06/11/15  Yes Arnoldo Lenis, MD    Review of Systems:  Constitutional: Denies fever, chills, diaphoresis, appetite change and fatigue.  HEENT: Denies photophobia, eye pain, redness, hearing loss, ear pain, congestion, sore throat, rhinorrhea, sneezing, mouth sores, trouble swallowing, neck pain, neck stiffness and tinnitus.   Respiratory: Denies cough, chest tightness,  and wheezing.   Cardiovascular: Denies chest pain, palpitations and leg swelling.  Gastrointestinal: Denies nausea, vomiting, abdominal pain, diarrhea, constipation, blood in stool and abdominal distention.  Genitourinary: Denies dysuria, urgency, frequency, hematuria, flank pain and difficulty urinating.  Endocrine: Denies: hot or cold intolerance, sweats, changes in hair or nails, polyuria, polydipsia. Musculoskeletal: Denies myalgias, back pain,arthralgias and gait problem.  Skin: Denies pallor, rash and wound.  Neurological: Denies dizziness, seizures, syncope, weakness, light-headedness, numbness and headaches.  Hematological: Denies adenopathy. Easy bruising, personal or family bleeding history  Psychiatric/Behavioral: Denies suicidal ideation, mood changes, confusion, nervousness, sleep disturbance and agitation    Physical Exam: Filed Vitals:   10/07/15 1430 10/07/15 1500 10/07/15 1530 10/07/15 1641  BP: 104/79 102/76 113/88 108/77  Pulse: 78 77 154 75  Temp:    97.4 F (36.3 C)  TempSrc:    Oral  Resp: 23 17 29 21   Height:      Weight:    79.47 kg (175 lb 3.2 oz)  SpO2: 95% 100% 93% 100%    Constitutional: NAD, calm, comfortable Eyes: PERRL, lids and conjunctivae normal ENMT: Mucous membranes are moist. Posterior pharynx clear of any exudate or lesions.Normal dentition.  Neck: normal, supple, no masses, no thyromegaly Respiratory: clear to auscultation bilaterally, no wheezing, no crackles. Normal respiratory effort. No accessory muscle use.  Cardiovascular: Regular rate and rhythm, no murmurs / rubs / gallops. No extremity  edema. 2+ pedal pulses. No carotid bruits.  Abdomen: no tenderness, no masses palpated. No hepatosplenomegaly. Bowel sounds positive.  Musculoskeletal: 3-4+ pitting edema bilaterally up to the hip area. Skin: no rashes, lesions, ulcers. No induration Neurologic: CN 2-12 grossly intact. Sensation intact, DTR normal. Strength 5/5 in all 4.  Psychiatric: Normal judgment and insight. Alert and oriented x 3. Normal mood.    Labs on Admission: I have personally reviewed following labs and imaging studies  CBC:  Recent Labs Lab 10/07/15 1227  WBC 5.5  NEUTROABS 3.6  HGB 9.8*  HCT 31.4*  MCV 82.4  PLT 123456*   Basic Metabolic Panel:  Recent Labs Lab 10/07/15 1227  NA 133*  K 4.0  CL 101  CO2 22  GLUCOSE 144*  BUN 26*  CREATININE 1.20  CALCIUM 8.7*   GFR: Estimated Creatinine Clearance: 61.9 mL/min (by C-G formula based on Cr of 1.2). Liver Function Tests:  Recent Labs Lab 10/07/15 1227  AST 33  ALT 15*  ALKPHOS 97  BILITOT 4.8*  PROT 8.0  ALBUMIN 2.8*   No results for input(s): LIPASE, AMYLASE in the last 168 hours. No results for input(s): AMMONIA in  the last 168 hours. Coagulation Profile: No results for input(s): INR, PROTIME in the last 168 hours. Cardiac Enzymes:  Recent Labs Lab 10/07/15 1227  TROPONINI <0.03   BNP (last 3 results) No results for input(s): PROBNP in the last 8760 hours. HbA1C: No results for input(s): HGBA1C in the last 72 hours. CBG: No results for input(s): GLUCAP in the last 168 hours. Lipid Profile: No results for input(s): CHOL, HDL, LDLCALC, TRIG, CHOLHDL, LDLDIRECT in the last 72 hours. Thyroid Function Tests: No results for input(s): TSH, T4TOTAL, FREET4, T3FREE, THYROIDAB in the last 72 hours. Anemia Panel: No results for input(s): VITAMINB12, FOLATE, FERRITIN, TIBC, IRON, RETICCTPCT in the last 72 hours. Urine analysis: No results found for: COLORURINE, APPEARANCEUR, LABSPEC, PHURINE, GLUCOSEU, HGBUR, BILIRUBINUR,  KETONESUR, PROTEINUR, UROBILINOGEN, NITRITE, LEUKOCYTESUR Sepsis Labs: @LABRCNTIP (procalcitonin:4,lacticidven:4) )No results found for this or any previous visit (from the past 240 hour(s)).   Radiological Exams on Admission: Dg Chest 2 View  10/07/2015  CLINICAL DATA:  Lower extremity swelling EXAM: CHEST  2 VIEW COMPARISON:  09/01/2015 chest radiograph. FINDINGS: Sternotomy wires appear aligned and intact. Stable configuration of 2 lead left subclavian ICD. Stable cardiomediastinal silhouette with mild-to-moderate cardiomegaly. No pneumothorax. No pleural effusion. No overt pulmonary edema. No acute consolidative airspace disease. IMPRESSION: Stable cardiomegaly without overt pulmonary edema. Electronically Signed   By: Ilona Sorrel M.D.   On: 10/07/2015 12:45    EKG: Independently reviewed. Normal sinus rhythm at a rate of 77, right bundle branch block, no acute ischemic changes  Assessment/Plan Principal Problem:   Acute systolic CHF (congestive heart failure) (HCC) Active Problems:   Essential hypertension   Ischemic cardiomyopathy   Hyperlipidemia   Automatic implantable cardioverter-defibrillator in situ   Acute exacerbation of CHF (congestive heart failure) (HCC)   Anasarca   Normocytic anemia    Acute on chronic systolic CHF -As per echo in September 2016 has an ejection fraction of less than 25% with wall motion abnormalities, pulmonary hypertension with a PA pressure of 64 and a moderately reduced RV function. -Start Lasix 80 mg IV twice daily at the recommendation of cardiology, strict I's and O's, daily weights, strive for negative fluid balance. No need to repeat echocardiogram.  -Continue beta blocker and ACE inhibitor, aspirin, spironolactone. Not on a statin (will need to investigate why before starting). -Has AICD in place.  Normocytic anemia  -Suspect anemia of chronic disease, anemia panel requested. No need for transfusion at present.  History of PE  -continue  Coumadin as dosed by pharmacy.   DVT prophylaxis: On Coumadin  Code Status: Full code  Family Communication: Discussed with wife at bedside and all questions answered  Disposition Plan: Anticipate hospital stay of at least 72 hours  Consults called: Cardiology  Admission status: Inpatient    Time Spent: 75 minutes  Lelon Frohlich MD [facility] Pager [phone]  If 7PM-7AM, please contact night-coverage [email or web address] Password [facility]  10/07/2015, 6:37 PM

## 2015-10-07 NOTE — Progress Notes (Signed)
ANTICOAGULATION CONSULT NOTE - Initial Consult  Pharmacy Consult for Coumadin from home Indication: h/o VTE  No Known Allergies  Patient Measurements: Height: 5\' 11"  (180.3 cm) Weight: 175 lb 3.2 oz (79.47 kg) IBW/kg (Calculated) : 75.3  Vital Signs: Temp: 97.4 F (36.3 C) (05/04 1641) Temp Source: Oral (05/04 1641) BP: 108/77 mmHg (05/04 1641) Pulse Rate: 75 (05/04 1641)  Labs:  Recent Labs  10/07/15 1227 10/07/15 1859  HGB 9.8*  --   HCT 31.4*  --   PLT 148*  --   LABPROT  --  25.0*  INR  --  2.29*  CREATININE 1.20  --   TROPONINI <0.03  --     Estimated Creatinine Clearance: 61.9 mL/min (by C-G formula based on Cr of 1.2).   Medical History: Past Medical History  Diagnosis Date  . Essential hypertension   . Myocardial infarction acute ([place])   . Ischemic cardiomyopathy     EF 20% by echo 2013  . Other pulmonary embolism and infarction     On coumadin  . Hyperlipidemia   . DOE (dyspnea on exertion)   . CAD (coronary artery disease)   . CVA (cerebral infarction)   . Anxiety   . Tricuspid regurgitation     Severe  . Pulmonary hypertension (HCC)     56 mm Hg  . Cirrhosis ([place])     Medications:  Prescriptions prior to admission  Medication Sig Dispense Refill Last Dose  . carvedilol (COREG) 6.25 MG tablet Take 9.375 mg by mouth daily.   Past Week at Unknown time  . KLOR-CON M20 20 MEQ tablet TAKE ONE TABLET BY MOUTH ONCE DAILY (Patient taking differently: TAKE ONE TABLET BY MOUTH ONCE DAILY as needed) 30 tablet 6 Past Week at Unknown time  . lisinopril (PRINIVIL,ZESTRIL) 5 MG tablet Take 1 tablet (5 mg total) by mouth daily. 90 tablet 3 Past Week at Unknown time  . polyethylene glycol (MIRALAX / GLYCOLAX) packet Take 17 g by mouth daily as needed.   10/06/2015 at Unknown time  . spironolactone (ALDACTONE) 25 MG tablet Take 75 mg by mouth every evening.   Past Week at Unknown time  . torsemide (DEMADEX) 20 MG tablet Take 40 mg in the morning & take 20 mg in  the evening 60 tablet 3 10/06/2015 at Unknown time  . traMADol (ULTRAM) 50 MG tablet Take by mouth every 6 (six) hours as needed.   unknown at Unknown time  . warfarin (COUMADIN) 3 MG tablet Take 2 tablets daily or as directed 180 tablet 3 10/06/2015 at 1900    Assessment: INR therapeutic on admission.    Goal of Therapy:  INR 2-3 Monitor platelets by anticoagulation protocol: Yes   Plan:  Coumadin 3mg  po tonight x 1 INR daily  Hart Robinsons A 10/07/2015,7:22 PM

## 2015-10-08 ENCOUNTER — Inpatient Hospital Stay (HOSPITAL_COMMUNITY): Payer: Medicare Other

## 2015-10-08 DIAGNOSIS — I5043 Acute on chronic combined systolic (congestive) and diastolic (congestive) heart failure: Secondary | ICD-10-CM

## 2015-10-08 DIAGNOSIS — I509 Heart failure, unspecified: Secondary | ICD-10-CM

## 2015-10-08 LAB — BASIC METABOLIC PANEL
Anion gap: 9 (ref 5–15)
BUN: 25 mg/dL — AB (ref 6–20)
CHLORIDE: 101 mmol/L (ref 101–111)
CO2: 23 mmol/L (ref 22–32)
Calcium: 8.7 mg/dL — ABNORMAL LOW (ref 8.9–10.3)
Creatinine, Ser: 1.21 mg/dL (ref 0.61–1.24)
GFR calc non Af Amer: 59 mL/min — ABNORMAL LOW (ref >60)
Glucose, Bld: 100 mg/dL — ABNORMAL HIGH (ref 65–99)
POTASSIUM: 3.5 mmol/L (ref 3.5–5.1)
SODIUM: 133 mmol/L — AB (ref 135–145)

## 2015-10-08 LAB — FOLATE: FOLATE: 13.1 ng/mL (ref >5.9)

## 2015-10-08 LAB — ECHOCARDIOGRAM COMPLETE
Height: 71 in
Weight: 2928 oz

## 2015-10-08 LAB — PROTIME-INR
INR: 2.12 — ABNORMAL HIGH (ref 0.00–1.49)
PROTHROMBIN TIME: 23.6 s — AB (ref 11.6–15.2)

## 2015-10-08 MED ORDER — WARFARIN SODIUM 2 MG PO TABS
3.0000 mg | ORAL_TABLET | Freq: Once | ORAL | Status: AC
  Administered 2015-10-08: 3 mg via ORAL
  Filled 2015-10-08: qty 1

## 2015-10-08 NOTE — Care Management Important Message (Signed)
Important Message  Patient Details  Name: Rick [name] MRN: [id number] Date of Birth: 1945/09/05   Medicare Important Message Given:  Yes    Alvie Heidelberg, RN 10/08/2015, 2:41 PM

## 2015-10-08 NOTE — Progress Notes (Signed)
ANTICOAGULATION CONSULT NOTE - follow up  Pharmacy Consult for Coumadin from home Indication: h/o VTE  No Known Allergies  Patient Measurements: Height: 5\' 11"  (180.3 cm) Weight: 183 lb (83.008 kg) IBW/kg (Calculated) : 75.3  Vital Signs: Temp: 97.8 F (36.6 C) (05/05 0555) Temp Source: Oral (05/05 0555) BP: 92/62 mmHg (05/05 0555) Pulse Rate: 69 (05/05 0555)  Labs:  Recent Labs  10/07/15 1227 10/07/15 1859 10/08/15 0547  HGB 9.8*  --   --   HCT 31.4*  --   --   PLT 148*  --   --   LABPROT  --  25.0* 23.6*  INR  --  2.29* 2.12*  CREATININE 1.20  --  1.21  TROPONINI <0.03  --   --    Estimated Creatinine Clearance: 61.4 mL/min (by C-G formula based on Cr of 1.21).  Medical History: Past Medical History  Diagnosis Date  . Essential hypertension   . Myocardial infarction acute ([place])   . Ischemic cardiomyopathy     EF 20% by echo 2013  . Other pulmonary embolism and infarction     On coumadin  . Hyperlipidemia   . DOE (dyspnea on exertion)   . CAD (coronary artery disease)   . CVA (cerebral infarction)   . Anxiety   . Tricuspid regurgitation     Severe  . Pulmonary hypertension (HCC)     56 mm Hg  . Cirrhosis ([place])    Medications:  Prescriptions prior to admission  Medication Sig Dispense Refill Last Dose  . carvedilol (COREG) 6.25 MG tablet Take 9.375 mg by mouth daily.   Past Week at Unknown time  . KLOR-CON M20 20 MEQ tablet TAKE ONE TABLET BY MOUTH ONCE DAILY (Patient taking differently: TAKE ONE TABLET BY MOUTH ONCE DAILY as needed) 30 tablet 6 Past Week at Unknown time  . lisinopril (PRINIVIL,ZESTRIL) 5 MG tablet Take 1 tablet (5 mg total) by mouth daily. 90 tablet 3 Past Week at Unknown time  . polyethylene glycol (MIRALAX / GLYCOLAX) packet Take 17 g by mouth daily as needed.   10/06/2015 at Unknown time  . spironolactone (ALDACTONE) 25 MG tablet Take 75 mg by mouth every evening.   Past Week at Unknown time  . torsemide (DEMADEX) 20 MG tablet Take 40  mg in the morning & take 20 mg in the evening 60 tablet 3 10/06/2015 at Unknown time  . traMADol (ULTRAM) 50 MG tablet Take by mouth every 6 (six) hours as needed.   unknown at Unknown time  . warfarin (COUMADIN) 3 MG tablet Take 2 tablets daily or as directed 180 tablet 3 10/06/2015 at 1900   Assessment: INR therapeutic on home dose.  Per coumadin clinic notes, home dose is 3mg  daily.  No bleeding reported.    Goal of Therapy:  INR 2-3 Monitor platelets by anticoagulation protocol: Yes   Plan:  Coumadin 3mg  po tonight x 1 INR daily  [place] Crane, Ashanna Heinsohn A 10/08/2015,11:35 AM

## 2015-10-08 NOTE — Progress Notes (Signed)
Primary Cardiologist: Carlyle Dolly MD  Cardiology Specific Problem List: 1. ICM LVEF of 25% 2. Acute on Chronic Systolic CHF with Anasarca 3. CAD - CABG 2003  Subjective:    Admitted from [facility] office with anasarca and 15-17 lb weight gain. Not yet feeling better. Some improvement in breathing while sitting upright.   Objective:   Temp:  [97.4 F (36.3 C)-97.9 F (36.6 C)] 97.8 F (36.6 C) (05/05 0555) Pulse Rate:  [69-154] 69 (05/05 0555) Resp:  [13-29] 20 (05/05 0555) BP: (92-113)/(62-88) 92/62 mmHg (05/05 0555) SpO2:  [93 %-100 %] 100 % (05/04 2131) Weight:  [175 lb 3.2 oz (79.47 kg)-187 lb 6.4 oz (85.004 kg)] 183 lb (83.008 kg) (05/05 0555) Last BM Date: 10/07/15  Filed Weights   10/07/15 1154 10/07/15 1641 10/08/15 0555  Weight: 187 lb (84.823 kg) 175 lb 3.2 oz (79.47 kg) 183 lb (83.008 kg)    Intake/Output Summary (Last 24 hours) at 10/08/15 0833 Last data filed at 10/07/15 1553  Gross per 24 hour  Intake      0 ml  Output    730 ml  Net   -730 ml    Telemetry: SR with RBBB with one burst of SVT.  Exam:  General: No acute distress.  Lungs: Diminished in the bases. No wheezes or rhonchi.  Cardiac: Ellevated JVP  RRR, 2/6 holosystolic murmur.   Abdomen: Normoactive bowel sounds, nontender, mildly distended.  Extremities: Significant pitting edema to the thighs bilaterally. No testicular edema. LEE very tight with some weeping.   Neuropsychiatric: Alert and oriented x3, affect appropriate.  Lab Results:  Basic Metabolic Panel:  Recent Labs Lab 10/07/15 1227 10/08/15 0547  NA 133* 133*  K 4.0 3.5  CL 101 101  CO2 22 23  GLUCOSE 144* 100*  BUN 26* 25*  CREATININE 1.20 1.21  CALCIUM 8.7* 8.7*    Liver Function Tests:  Recent Labs Lab 10/07/15 1227  AST 33  ALT 15*  ALKPHOS 97  BILITOT 4.8*  PROT 8.0  ALBUMIN 2.8*    CBC:  Recent Labs Lab 10/07/15 1227  WBC 5.5  HGB 9.8*  HCT 31.4*  MCV 82.4  PLT 148*    Cardiac  Enzymes:  Recent Labs Lab 10/07/15 1227  TROPONINI <0.03   Coagulation:  Recent Labs Lab 10/07/15 1859 10/08/15 0547  INR 2.29* 2.12*   02/2015 echocardiogram: Study Conclusions  - Left ventricle: The cavity size was severely dilated. Wall thickness was normal. Systolic function was severely reduced. The estimated ejection fraction was 25%. There is akinesis of the apicalanteroseptal and anterolateral myocardium. There is akinesis of the basalinferior myocardium. Doppler parameters are consistent with restrictive physiology, indicative of decreased left ventricular diastolic compliance and/or increased left atrial pressure. - Aortic valve: Moderately calcified annulus. Trileaflet; mildly calcified leaflets. There was trivial regurgitation. - Mitral valve: Mildly thickened leaflets . There was mild regurgitation. - Left atrium: The atrium was moderately dilated. - Right ventricle: The cavity size was moderately dilated. Pacer wire or catheter noted in right ventricle. Systolic function was mildly to moderately reduced. - Right atrium: The atrium was severely dilated. - Tricuspid valve: There was moderate regurgitation. - Pulmonary arteries: Systolic pressure was severely increased. PA peak pressure: 64 mm Hg (S). - Pericardium, extracardiac: There was no pericardial effusion.  Impressions:  - Severe LV chamber dilatation with LVEF approximately 25%, wall motion abnormalities consistent with ischemic cardiomyopathy. Restrictive diastolic filling pattern. Moderate left atrial enlargement. Mild mitral regurgitation. Sclerotic aortic valve  with trivial aortic regurgitation. Moderate RV dysfunction. Device wire present in right heart. Moderate tricuspid regurgitation with severe pulmonary hypertension, PASP 64 mmHg. Severe right atrial enlargement.  Radiology: Dg Chest 2 View  10/07/2015  CLINICAL DATA:  Lower extremity swelling EXAM:  CHEST  2 VIEW COMPARISON:  09/01/2015 chest radiograph. FINDINGS: Sternotomy wires appear aligned and intact. Stable configuration of 2 lead left subclavian ICD. Stable cardiomediastinal silhouette with mild-to-moderate cardiomegaly. No pneumothorax. No pleural effusion. No overt pulmonary edema. No acute consolidative airspace disease. IMPRESSION: Stable cardiomegaly without overt pulmonary edema. Electronically Signed   By: Ilona Sorrel M.D.   On: 10/07/2015 12:45    Medications:   Scheduled Medications: . aspirin EC  81 mg Oral Daily  . carvedilol  9.375 mg Oral Q breakfast  . furosemide  80 mg Intravenous BID  . lisinopril  5 mg Oral Daily  . polyethylene glycol  17 g Oral Daily  . potassium chloride SA  20 mEq Oral Daily  . sodium chloride flush  3 mL Intravenous Q12H  . spironolactone  75 mg Oral QPM  . Warfarin - Pharmacist Dosing Inpatient   Does not apply Q24H    PRN Medications: sodium chloride, acetaminophen, ondansetron (ZOFRAN) IV, sodium chloride flush   Assessment and Plan:   1. Acute on Chronic Systolic CHF with anasarca: Significant edema to the thighs bilaterally. On lasix 80 mg IV BID. May need to change to TID for 24 hours if he does not begin to diurese after am dose of lasix. Creatinine stable. Continue ACE, spironolactone. Follow labs. Keep legs elevated. Potassium 3.5. Has been given po replacement.  Careful follow up on spironolactone.  2. ICM: Most recent LVEF 25%. He remains on coreg 9.75 BID and ACE-I. NYHA class III DOE.  3. Hx of PE: On coumadin. Hgb is 9.8/ Hct 31.4. Monitor for anemia during hospitalization. INR 2.12 this am. Pulmonary hypertension noted on most recent echo on 03/04/2015. PA pressure 64 mmHg.   4. ICD in situ: [place] 2013: Followed by Dr. Rayann Heman in [place]. Last remote check on 06/09/2015. Normal device function. No changes in parameters.   4. CAD: History of CABG 4 vessel in 2003. No complaints of chest pain. Continues generalized  fatigue due to breathing status and fluid retention. Continue ASA, ACE, BB. Not currently on statin. Defer to Dr. Harl Bowie.   Phill Myron. Lawrence NP [place]  10/08/2015, 8:33 AM    Attending note:  Patient seen and examined. Reviewed records and modified above note by Ms. Lawrence NP. Mr. Berhe is admitted with acute on chronic combined heart failure, right heart predominant with anasaca. He has been started on IV Lasix, continues with Coreg, Lisinopril, and Aldatone. He was in no distress this morning, HR 60-70s and SBP 90-110. 700 cc out so far. Has abdominal protuberance and significant leg/thigh edema, elevated JVP. Creatinine 1.2. Follow-up echocardiogram.  Satira Sark, M.D., F.A.C.C.

## 2015-10-08 NOTE — Progress Notes (Signed)
Pt has been very restless throughout the night getting up frequently and only sleeping for short periods at a time. The pt reported that he has problems sleeping at home as well. Pt also has been refusing to have his bed alarm on and has been educated regarding safety measures. Call bell within reach, bed low, yellow socks on, light on, etc.

## 2015-10-08 NOTE — Progress Notes (Signed)
PROGRESS NOTE    Rick [name]  [id number] DOB: December 25, 1945 DOA: 10/07/2015 PCP: Wende Neighbors, MD     Brief Narrative:  70 year old man admitted on 5/4 for acute on chronic combined CHF. Noted to have an ejection fraction of 20%. Here for diuresis.   Assessment & Plan:   Principal Problem:   Acute systolic CHF (congestive heart failure) (HCC) Active Problems:   Essential hypertension   Ischemic cardiomyopathy   Hyperlipidemia   Automatic implantable cardioverter-defibrillator in situ   Acute exacerbation of CHF (congestive heart failure) (HCC)   Anasarca   Normocytic anemia   Acute on chronic combined CHF -Continue Lasix 80 mg IV twice a day, is about 700 mL negative since admission. -Still has significant volume overload on exam.  History of PE -Continue Coumadin as dosed by pharmacy.  Normocytic anemia -Anemia panel confirms anemia of chronic disease. -No need for transfusion at present.   DVT prophylaxis: Fully anticoagulated on Coumadin Code Status: Full code Family Communication: Patient only Disposition Plan: To be determined  Consultants:   Cardiology  Procedures:   None  Antimicrobials:   None    Subjective: Shortness of breath has improved, no chest pain  Objective: Filed Vitals:   10/07/15 1641 10/07/15 2131 10/08/15 0555 10/08/15 1257  BP: 108/77 113/78 92/62 106/74  Pulse: 75 76 69 74  Temp: 97.4 F (36.3 C)  97.8 F (36.6 C) 98.1 F (36.7 C)  TempSrc: Oral  Oral Oral  Resp: 21 21 20 20   Height:      Weight: 79.47 kg (175 lb 3.2 oz)  83.008 kg (183 lb)   SpO2: 100% 100%  99%    Intake/Output Summary (Last 24 hours) at 10/08/15 1607 Last data filed at 10/08/15 1100  Gross per 24 hour  Intake    480 ml  Output      0 ml  Net    480 ml   Filed Weights   10/07/15 1154 10/07/15 1641 10/08/15 0555  Weight: 84.823 kg (187 lb) 79.47 kg (175 lb 3.2 oz) 83.008 kg (183 lb)    Examination:  General exam: Alert, awake, oriented x  3 Respiratory system: Clear to auscultation. Respiratory effort normal. Cardiovascular system:RRR. No murmurs, rubs, gallops. Gastrointestinal system: Abdomen is nondistended, soft and nontender. No organomegaly or masses felt. Normal bowel sounds heard. Central nervous system: Alert and oriented. No focal neurological deficits. Extremities: 3+ pedal edema up to thighs bilaterally Skin: No rashes, lesions or ulcers Psychiatry: Judgement and insight appear normal. Mood & affect appropriate.     Data Reviewed: I have personally reviewed following labs and imaging studies  CBC:  Recent Labs Lab 10/07/15 1227  WBC 5.5  NEUTROABS 3.6  HGB 9.8*  HCT 31.4*  MCV 82.4  PLT 123456*   Basic Metabolic Panel:  Recent Labs Lab 10/07/15 1227 10/08/15 0547  NA 133* 133*  K 4.0 3.5  CL 101 101  CO2 22 23  GLUCOSE 144* 100*  BUN 26* 25*  CREATININE 1.20 1.21  CALCIUM 8.7* 8.7*   GFR: Estimated Creatinine Clearance: 61.4 mL/min (by C-G formula based on Cr of 1.21). Liver Function Tests:  Recent Labs Lab 10/07/15 1227  AST 33  ALT 15*  ALKPHOS 97  BILITOT 4.8*  PROT 8.0  ALBUMIN 2.8*   No results for input(s): LIPASE, AMYLASE in the last 168 hours. No results for input(s): AMMONIA in the last 168 hours. Coagulation Profile:  Recent Labs Lab 10/07/15 1859 10/08/15 0547  INR 2.29*  2.12*   Cardiac Enzymes:  Recent Labs Lab 10/07/15 1227  TROPONINI <0.03   BNP (last 3 results) No results for input(s): PROBNP in the last 8760 hours. HbA1C: No results for input(s): HGBA1C in the last 72 hours. CBG: No results for input(s): GLUCAP in the last 168 hours. Lipid Profile: No results for input(s): CHOL, HDL, LDLCALC, TRIG, CHOLHDL, LDLDIRECT in the last 72 hours. Thyroid Function Tests: No results for input(s): TSH, T4TOTAL, FREET4, T3FREE, THYROIDAB in the last 72 hours. Anemia Panel:  Recent Labs  10/07/15 1227 10/07/15 1859  VITAMINB12 689  --   FOLATE  --   13.1  FERRITIN 42  --   TIBC 409  --   IRON 35*  --   RETICCTPCT 3.1  --    Urine analysis: No results found for: COLORURINE, APPEARANCEUR, LABSPEC, PHURINE, GLUCOSEU, HGBUR, BILIRUBINUR, KETONESUR, PROTEINUR, UROBILINOGEN, NITRITE, LEUKOCYTESUR Sepsis Labs: @LABRCNTIP (procalcitonin:4,lacticidven:4)  )No results found for this or any previous visit (from the past 240 hour(s)).       Radiology Studies: Dg Chest 2 View  10/07/2015  CLINICAL DATA:  Lower extremity swelling EXAM: CHEST  2 VIEW COMPARISON:  09/01/2015 chest radiograph. FINDINGS: Sternotomy wires appear aligned and intact. Stable configuration of 2 lead left subclavian ICD. Stable cardiomediastinal silhouette with mild-to-moderate cardiomegaly. No pneumothorax. No pleural effusion. No overt pulmonary edema. No acute consolidative airspace disease. IMPRESSION: Stable cardiomegaly without overt pulmonary edema. Electronically Signed   By: Ilona Sorrel M.D.   On: 10/07/2015 12:45        Scheduled Meds: . aspirin EC  81 mg Oral Daily  . carvedilol  9.375 mg Oral Q breakfast  . furosemide  80 mg Intravenous BID  . lisinopril  5 mg Oral Daily  . polyethylene glycol  17 g Oral Daily  . potassium chloride SA  20 mEq Oral Daily  . sodium chloride flush  3 mL Intravenous Q12H  . spironolactone  75 mg Oral QPM  . warfarin  3 mg Oral Once  . Warfarin - Pharmacist Dosing Inpatient   Does not apply Q24H   Continuous Infusions:    LOS: 1 day    Time spent: 25 minutes. Greater than 50% of this time was spent in direct contact with the patient coordinating care.     Lelon Frohlich, MD [facility] Pager [phone]  If 7PM-7AM, please contact night-coverage [email or web address] Password [email or web address] 10/08/2015, 4:07 PM

## 2015-10-09 LAB — BASIC METABOLIC PANEL
ANION GAP: 7 (ref 5–15)
BUN: 30 mg/dL — ABNORMAL HIGH (ref 6–20)
CALCIUM: 8.9 mg/dL (ref 8.9–10.3)
CO2: 24 mmol/L (ref 22–32)
Chloride: 102 mmol/L (ref 101–111)
Creatinine, Ser: 1.3 mg/dL — ABNORMAL HIGH (ref 0.61–1.24)
GFR calc non Af Amer: 54 mL/min — ABNORMAL LOW (ref >60)
Glucose, Bld: 101 mg/dL — ABNORMAL HIGH (ref 65–99)
Potassium: 4.3 mmol/L (ref 3.5–5.1)
Sodium: 133 mmol/L — ABNORMAL LOW (ref 135–145)

## 2015-10-09 LAB — CBC
HEMATOCRIT: 30.2 % — AB (ref 39.0–52.0)
HEMOGLOBIN: 9.2 g/dL — AB (ref 13.0–17.0)
MCH: 25.3 pg — ABNORMAL LOW (ref 26.0–34.0)
MCHC: 30.5 g/dL (ref 30.0–36.0)
MCV: 83 fL (ref 78.0–100.0)
Platelets: 142 10*3/uL — ABNORMAL LOW (ref 150–400)
RBC: 3.64 MIL/uL — ABNORMAL LOW (ref 4.22–5.81)
RDW: 20.1 % — AB (ref 11.5–15.5)
WBC: 5.6 10*3/uL (ref 4.0–10.5)

## 2015-10-09 LAB — PROTIME-INR
INR: 2.01 — AB (ref 0.00–1.49)
Prothrombin Time: 22.7 seconds — ABNORMAL HIGH (ref 11.6–15.2)

## 2015-10-09 MED ORDER — WARFARIN SODIUM 2 MG PO TABS
3.0000 mg | ORAL_TABLET | Freq: Once | ORAL | Status: AC
  Administered 2015-10-09: 3 mg via ORAL
  Filled 2015-10-09: qty 1

## 2015-10-09 NOTE — Progress Notes (Signed)
[name]   PROGRESS NOTE    Rick [name]  [id number] DOB: 07-04-1945 DOA: 10/07/2015 PCP: Wende Neighbors, MD     Brief Narrative:  70 year old man admitted on 5/4 for acute on chronic combined CHF. Noted to have an ejection fraction of 20%. Here for diuresis.   Assessment & Plan:   Principal Problem:   Acute systolic CHF (congestive heart failure) (HCC) Active Problems:   Essential hypertension   Ischemic cardiomyopathy   Hyperlipidemia   Automatic implantable cardioverter-defibrillator in situ   Acute exacerbation of CHF (congestive heart failure) (HCC)   Anasarca   Normocytic anemia   Acute on chronic combined CHF -Continue Lasix 80 mg IV twice a day, is about 1400 mL negative since admission. -Still has significant volume overload on exam, although volume status is improved since admission.  History of PE -Continue Coumadin as dosed by pharmacy.  Normocytic anemia -Anemia panel confirms anemia of chronic disease. -No need for transfusion at present.   DVT prophylaxis: Fully anticoagulated on Coumadin Code Status: Full code Family Communication: Patient only Disposition Plan: To be determined  Consultants:   Cardiology  Procedures:   None  Antimicrobials:   None    Subjective: Shortness of breath has improved, no chest pain  Objective: Filed Vitals:   10/07/15 2131 10/08/15 0555 10/08/15 1257 10/09/15 1442  BP: 113/78 92/62 106/74   Pulse: 76 69 74   Temp:  97.8 F (36.6 C) 98.1 F (36.7 C)   TempSrc:  Oral Oral   Resp: 21 20 20    Height:      Weight:  83.008 kg (183 lb)  83.915 kg (185 lb)  SpO2: 100%  99%     Intake/Output Summary (Last 24 hours) at 10/09/15 1538 Last data filed at 10/09/15 0900  Gross per 24 hour  Intake    240 ml  Output   1200 ml  Net   -960 ml   Filed Weights   10/07/15 1641 10/08/15 0555 10/09/15 1442  Weight: 79.47 kg (175 lb 3.2 oz) 83.008 kg (183 lb) 83.915 kg (185 lb)    Examination:  General exam: Alert,  awake, oriented x 3 Respiratory system: Clear to auscultation. Respiratory effort normal. Cardiovascular system:RRR. No murmurs, rubs, gallops. Gastrointestinal system: Abdomen is nondistended, soft and nontender. No organomegaly or masses felt. Normal bowel sounds heard. Central nervous system: Alert and oriented. No focal neurological deficits. Extremities: 3+ pedal edema up to thighs bilaterally Skin: No rashes, lesions or ulcers Psychiatry: Judgement and insight appear normal. Mood & affect appropriate.     Data Reviewed: I have personally reviewed following labs and imaging studies  CBC:  Recent Labs Lab 10/07/15 1227 10/09/15 1257  WBC 5.5 5.6  NEUTROABS 3.6  --   HGB 9.8* 9.2*  HCT 31.4* 30.2*  MCV 82.4 83.0  PLT 148* [id number]*   Basic Metabolic Panel:  Recent Labs Lab 10/07/15 1227 10/08/15 0547 10/09/15 1257  NA 133* 133* 133*  K 4.0 3.5 4.3  CL 101 101 102  CO2 22 23 24   GLUCOSE 144* 100* 101*  BUN 26* 25* 30*  CREATININE 1.20 1.21 1.30*  CALCIUM 8.7* 8.7* 8.9   GFR: Estimated Creatinine Clearance: 57.1 mL/min (by C-G formula based on Cr of 1.3). Liver Function Tests:  Recent Labs Lab 10/07/15 1227  AST 33  ALT 15*  ALKPHOS 97  BILITOT 4.8*  PROT 8.0  ALBUMIN 2.8*   No results for input(s): LIPASE, AMYLASE in the last 168 hours. No results  for input(s): AMMONIA in the last 168 hours. Coagulation Profile:  Recent Labs Lab 10/07/15 1859 10/08/15 0547 10/09/15 1257  INR 2.29* 2.12* 2.01*   Cardiac Enzymes:  Recent Labs Lab 10/07/15 1227  TROPONINI <0.03   BNP (last 3 results) No results for input(s): PROBNP in the last 8760 hours. HbA1C: No results for input(s): HGBA1C in the last 72 hours. CBG: No results for input(s): GLUCAP in the last 168 hours. Lipid Profile: No results for input(s): CHOL, HDL, LDLCALC, TRIG, CHOLHDL, LDLDIRECT in the last 72 hours. Thyroid Function Tests: No results for input(s): TSH, T4TOTAL, FREET4, T3FREE,  THYROIDAB in the last 72 hours. Anemia Panel:  Recent Labs  10/07/15 1227 10/07/15 1859  VITAMINB12 689  --   FOLATE  --  13.1  FERRITIN 42  --   TIBC 409  --   IRON 35*  --   RETICCTPCT 3.1  --    Urine analysis: No results found for: COLORURINE, APPEARANCEUR, LABSPEC, PHURINE, GLUCOSEU, HGBUR, BILIRUBINUR, KETONESUR, PROTEINUR, UROBILINOGEN, NITRITE, LEUKOCYTESUR Sepsis Labs: @LABRCNTIP (procalcitonin:4,lacticidven:4)  )No results found for this or any previous visit (from the past 240 hour(s)).       Radiology Studies: No results found.      Scheduled Meds: . aspirin EC  81 mg Oral Daily  . carvedilol  9.375 mg Oral Q breakfast  . furosemide  80 mg Intravenous BID  . lisinopril  5 mg Oral Daily  . polyethylene glycol  17 g Oral Daily  . potassium chloride SA  20 mEq Oral Daily  . sodium chloride flush  3 mL Intravenous Q12H  . spironolactone  75 mg Oral QPM  . warfarin  3 mg Oral Once  . Warfarin - Pharmacist Dosing Inpatient   Does not apply Q24H   Continuous Infusions:    LOS: 2 days    Time spent: 25 minutes. Greater than 50% of this time was spent in direct contact with the patient coordinating care.     Lelon Frohlich, MD [facility] Pager [phone]  If 7PM-7AM, please contact night-coverage [email or web address] Password [facility] [place] 10/09/2015, 3:38 PM

## 2015-10-09 NOTE — Progress Notes (Signed)
ANTICOAGULATION CONSULT NOTE - follow up  Pharmacy Consult for Coumadin from home Indication: h/o VTE  No Known Allergies  Patient Measurements: Height: 5\' 11"  (180.3 cm) Weight: 183 lb (83.008 kg) IBW/kg (Calculated) : 75.3  Vital Signs:    Labs:  Recent Labs  10/07/15 1227 10/07/15 1859 10/08/15 0547 10/09/15 1257  HGB 9.8*  --   --  9.2*  HCT 31.4*  --   --  30.2*  PLT 148*  --   --  PENDING  LABPROT  --  25.0* 23.6* 22.7*  INR  --  2.29* 2.12* 2.01*  CREATININE 1.20  --  1.21  --   TROPONINI <0.03  --   --   --    Estimated Creatinine Clearance: 61.4 mL/min (by C-G formula based on Cr of 1.21).  Medical History: Past Medical History  Diagnosis Date  . Essential hypertension   . Myocardial infarction acute ([place])   . Ischemic cardiomyopathy     EF 20% by echo 2013  . Other pulmonary embolism and infarction     On coumadin  . Hyperlipidemia   . DOE (dyspnea on exertion)   . CAD (coronary artery disease)   . CVA (cerebral infarction)   . Anxiety   . Tricuspid regurgitation     Severe  . Pulmonary hypertension (HCC)     56 mm Hg  . Cirrhosis ([place])    Medications:  Prescriptions prior to admission  Medication Sig Dispense Refill Last Dose  . carvedilol (COREG) 6.25 MG tablet Take 9.375 mg by mouth daily.   Past Week at Unknown time  . KLOR-CON M20 20 MEQ tablet TAKE ONE TABLET BY MOUTH ONCE DAILY (Patient taking differently: TAKE ONE TABLET BY MOUTH ONCE DAILY as needed) 30 tablet 6 Past Week at Unknown time  . lisinopril (PRINIVIL,ZESTRIL) 5 MG tablet Take 1 tablet (5 mg total) by mouth daily. 90 tablet 3 Past Week at Unknown time  . polyethylene glycol (MIRALAX / GLYCOLAX) packet Take 17 g by mouth daily as needed.   10/06/2015 at Unknown time  . spironolactone (ALDACTONE) 25 MG tablet Take 75 mg by mouth every evening.   Past Week at Unknown time  . torsemide (DEMADEX) 20 MG tablet Take 40 mg in the morning & take 20 mg in the evening 60 tablet 3 10/06/2015  at Unknown time  . traMADol (ULTRAM) 50 MG tablet Take by mouth every 6 (six) hours as needed.   unknown at Unknown time  . warfarin (COUMADIN) 3 MG tablet Take 2 tablets daily or as directed 180 tablet 3 10/06/2015 at 1900   Assessment: INR therapeutic on home dose.  Per coumadin clinic notes, home dose is 3mg  daily.  No bleeding reported.    Goal of Therapy:  INR 2-3 Monitor platelets by anticoagulation protocol: Yes   Plan:  Coumadin 3mg  po tonight x 1 INR daily  Abner Greenspan, Candon Caras Bennett 10/09/2015,1:26 PM

## 2015-10-10 LAB — CBC
HEMATOCRIT: 30.7 % — AB (ref 39.0–52.0)
Hemoglobin: 9.2 g/dL — ABNORMAL LOW (ref 13.0–17.0)
MCH: 24.9 pg — ABNORMAL LOW (ref 26.0–34.0)
MCHC: 30 g/dL (ref 30.0–36.0)
MCV: 83.2 fL (ref 78.0–100.0)
PLATELETS: 130 10*3/uL — AB (ref 150–400)
RBC: 3.69 MIL/uL — ABNORMAL LOW (ref 4.22–5.81)
RDW: 20.2 % — AB (ref 11.5–15.5)
WBC: 5.6 10*3/uL (ref 4.0–10.5)

## 2015-10-10 LAB — BASIC METABOLIC PANEL
ANION GAP: 8 (ref 5–15)
BUN: 36 mg/dL — ABNORMAL HIGH (ref 6–20)
CALCIUM: 8.9 mg/dL (ref 8.9–10.3)
CO2: 24 mmol/L (ref 22–32)
CREATININE: 1.29 mg/dL — AB (ref 0.61–1.24)
Chloride: 102 mmol/L (ref 101–111)
GFR, EST NON AFRICAN AMERICAN: 55 mL/min — AB (ref >60)
Glucose, Bld: 140 mg/dL — ABNORMAL HIGH (ref 65–99)
Potassium: 4.4 mmol/L (ref 3.5–5.1)
Sodium: 134 mmol/L — ABNORMAL LOW (ref 135–145)

## 2015-10-10 LAB — PROTIME-INR
INR: 2.05 — AB (ref 0.00–1.49)
Prothrombin Time: 22.9 seconds — ABNORMAL HIGH (ref 11.6–15.2)

## 2015-10-10 MED ORDER — METOLAZONE 5 MG PO TABS
5.0000 mg | ORAL_TABLET | Freq: Once | ORAL | Status: AC
  Administered 2015-10-10: 5 mg via ORAL
  Filled 2015-10-10: qty 1

## 2015-10-10 MED ORDER — WARFARIN SODIUM 2 MG PO TABS
3.0000 mg | ORAL_TABLET | Freq: Once | ORAL | Status: AC
  Administered 2015-10-10: 3 mg via ORAL
  Filled 2015-10-10: qty 1

## 2015-10-10 NOTE — Progress Notes (Signed)
ANTICOAGULATION CONSULT NOTE - follow up  Pharmacy Consult for Coumadin from home Indication: h/o VTE  No Known Allergies  Patient Measurements: Height: 5\' 11"  (180.3 cm) Weight: 185 lb (83.915 kg) IBW/kg (Calculated) : 75.3  Vital Signs: Temp: 97.9 F (36.6 C) (05/07 0644) Temp Source: Oral (05/07 0644) BP: 85/55 mmHg (05/07 0644) Pulse Rate: 64 (05/07 0644)  Labs:  Recent Labs  10/07/15 1227  10/08/15 0547 10/09/15 1257 10/10/15 0631  HGB 9.8*  --   --  9.2* 9.2*  HCT 31.4*  --   --  30.2* 30.7*  PLT 148*  --   --  142* 130*  LABPROT  --   < > 23.6* 22.7* 22.9*  INR  --   < > 2.12* 2.01* 2.05*  CREATININE 1.20  --  1.21 1.30* 1.29*  TROPONINI <0.03  --   --   --   --   < > = values in this interval not displayed. Estimated Creatinine Clearance: 57.6 mL/min (by C-G formula based on Cr of 1.29).  Medical History: Past Medical History  Diagnosis Date  . Essential hypertension   . Myocardial infarction acute ([place])   . Ischemic cardiomyopathy     EF 20% by echo 2013  . Other pulmonary embolism and infarction     On coumadin  . Hyperlipidemia   . DOE (dyspnea on exertion)   . CAD (coronary artery disease)   . CVA (cerebral infarction)   . Anxiety   . Tricuspid regurgitation     Severe  . Pulmonary hypertension (HCC)     56 mm Hg  . Cirrhosis ([place])    Medications:  Prescriptions prior to admission  Medication Sig Dispense Refill Last Dose  . carvedilol (COREG) 6.25 MG tablet Take 9.375 mg by mouth daily.   Past Week at Unknown time  . KLOR-CON M20 20 MEQ tablet TAKE ONE TABLET BY MOUTH ONCE DAILY (Patient taking differently: TAKE ONE TABLET BY MOUTH ONCE DAILY as needed) 30 tablet 6 Past Week at Unknown time  . lisinopril (PRINIVIL,ZESTRIL) 5 MG tablet Take 1 tablet (5 mg total) by mouth daily. 90 tablet 3 Past Week at Unknown time  . polyethylene glycol (MIRALAX / GLYCOLAX) packet Take 17 g by mouth daily as needed.   10/06/2015 at Unknown time  .  spironolactone (ALDACTONE) 25 MG tablet Take 75 mg by mouth every evening.   Past Week at Unknown time  . torsemide (DEMADEX) 20 MG tablet Take 40 mg in the morning & take 20 mg in the evening 60 tablet 3 10/06/2015 at Unknown time  . traMADol (ULTRAM) 50 MG tablet Take by mouth every 6 (six) hours as needed.   unknown at Unknown time  . warfarin (COUMADIN) 3 MG tablet Take 2 tablets daily or as directed 180 tablet 3 10/06/2015 at 1900   Assessment: INR therapeutic on home dose.  Per coumadin clinic notes, home dose is 3mg  daily.  No bleeding reported.    Goal of Therapy:  INR 2-3 Monitor platelets by anticoagulation protocol: Yes   Plan:  Coumadin 3mg  po tonight x 1 INR daily  Harlow Basley Bennett 10/10/2015,8:55 AM

## 2015-10-10 NOTE — Progress Notes (Signed)
Rick [name] Kitchen   PROGRESS NOTE    Rick [name]  [id number] DOB: March 27, 1946 DOA: 10/07/2015 PCP: Rick Neighbors, MD     Brief Narrative:  70 year old man admitted on 5/4 for acute on chronic combined CHF. Noted to have an ejection fraction of 20%. Here for diuresis.   Assessment & Plan:   Principal Problem:   Acute systolic CHF (congestive heart failure) (HCC) Active Problems:   Essential hypertension   Ischemic cardiomyopathy   Hyperlipidemia   Automatic implantable cardioverter-defibrillator in situ   Acute exacerbation of CHF (congestive heart failure) (HCC)   Anasarca   Normocytic anemia   Acute on chronic combined CHF -Continue Lasix 80 mg IV twice a day, not much diuresis overnight. Will give xaroxolyn 5 mg once today. -Still has significant volume overload on exam, although volume status is improved since admission.  History of PE -Continue Coumadin as dosed by pharmacy.  Normocytic anemia -Anemia panel confirms anemia of chronic disease. -No need for transfusion at present.   DVT prophylaxis: Fully anticoagulated on Coumadin Code Status: Full code Family Communication: Patient only Disposition Plan: To be determined  Consultants:   Cardiology  Procedures:   None  Antimicrobials:   None    Subjective: Shortness of breath has improved, no chest pain  Objective: Filed Vitals:   10/09/15 2205 10/10/15 0644 10/10/15 1000 10/10/15 1508  BP: 106/76 85/55 92/54    Pulse: 72 64    Temp: 98 F (36.7 C) 97.9 F (36.6 C)    TempSrc: Oral Oral    Resp: 20 20    Height:      Weight:      SpO2: 100% 100%  98%   No intake or output data in the 24 hours ending 10/10/15 1541 Filed Weights   10/07/15 1641 10/08/15 0555 10/09/15 1442  Weight: 79.47 kg (175 lb 3.2 oz) 83.008 kg (183 lb) 83.915 kg (185 lb)    Examination:  General exam: Alert, awake, oriented x 3 Respiratory system: Clear to auscultation. Respiratory effort normal. Cardiovascular system:RRR. No  murmurs, rubs, gallops. Gastrointestinal system: Abdomen is nondistended, soft and nontender. No organomegaly or masses felt. Normal bowel sounds heard. Central nervous system: Alert and oriented. No focal neurological deficits. Extremities: 3+ pedal edema up to thighs bilaterally Skin: No rashes, lesions or ulcers Psychiatry: Judgement and insight appear normal. Mood & affect appropriate.     Data Reviewed: I have personally reviewed following labs and imaging studies  CBC:  Recent Labs Lab 10/07/15 1227 10/09/15 1257 10/10/15 0631  WBC 5.5 5.6 5.6  NEUTROABS 3.6  --   --   HGB 9.8* 9.2* 9.2*  HCT 31.4* 30.2* 30.7*  MCV 82.4 83.0 83.2  PLT 148* 142* [id number]*   Basic Metabolic Panel:  Recent Labs Lab 10/07/15 1227 10/08/15 0547 10/09/15 1257 10/10/15 0631  NA 133* 133* 133* 134*  K 4.0 3.5 4.3 4.4  CL 101 101 102 102  CO2 22 23 24 24   GLUCOSE 144* 100* 101* 140*  BUN 26* 25* 30* 36*  CREATININE 1.20 1.21 1.30* 1.29*  CALCIUM 8.7* 8.7* 8.9 8.9   GFR: Estimated Creatinine Clearance: 57.6 mL/min (by C-G formula based on Cr of 1.29). Liver Function Tests:  Recent Labs Lab 10/07/15 1227  AST 33  ALT 15*  ALKPHOS 97  BILITOT 4.8*  PROT 8.0  ALBUMIN 2.8*   No results for input(s): LIPASE, AMYLASE in the last 168 hours. No results for input(s): AMMONIA in the last 168 hours. Coagulation Profile:  Recent Labs Lab 10/07/15 1859 10/08/15 0547 10/09/15 1257 10/10/15 0631  INR 2.29* 2.12* 2.01* 2.05*   Cardiac Enzymes:  Recent Labs Lab 10/07/15 1227  TROPONINI <0.03   BNP (last 3 results) No results for input(s): PROBNP in the last 8760 hours. HbA1C: No results for input(s): HGBA1C in the last 72 hours. CBG: No results for input(s): GLUCAP in the last 168 hours. Lipid Profile: No results for input(s): CHOL, HDL, LDLCALC, TRIG, CHOLHDL, LDLDIRECT in the last 72 hours. Thyroid Function Tests: No results for input(s): TSH, T4TOTAL, FREET4, T3FREE,  THYROIDAB in the last 72 hours. Anemia Panel:  Recent Labs  10/07/15 1859  FOLATE 13.1   Urine analysis: No results found for: COLORURINE, APPEARANCEUR, LABSPEC, PHURINE, GLUCOSEU, HGBUR, BILIRUBINUR, KETONESUR, PROTEINUR, UROBILINOGEN, NITRITE, LEUKOCYTESUR Sepsis Labs: @LABRCNTIP (procalcitonin:4,lacticidven:4)  )No results found for this or any previous visit (from the past 240 hour(s)).       Radiology Studies: No results found.      Scheduled Meds: . aspirin EC  81 mg Oral Daily  . carvedilol  9.375 mg Oral Q breakfast  . furosemide  80 mg Intravenous BID  . lisinopril  5 mg Oral Daily  . metolazone  5 mg Oral Once  . polyethylene glycol  17 g Oral Daily  . potassium chloride SA  20 mEq Oral Daily  . sodium chloride flush  3 mL Intravenous Q12H  . spironolactone  75 mg Oral QPM  . warfarin  3 mg Oral Once  . Warfarin - Pharmacist Dosing Inpatient   Does not apply Q24H   Continuous Infusions:    LOS: 3 days    Time spent: 25 minutes. Greater than 50% of this time was spent in direct contact with the patient coordinating care.     Lelon Frohlich, MD [facility] Pager [phone]  If 7PM-7AM, please contact night-coverage [email or web address] Password [email or web address] 10/10/2015, 3:41 PM

## 2015-10-11 DIAGNOSIS — I5023 Acute on chronic systolic (congestive) heart failure: Secondary | ICD-10-CM

## 2015-10-11 LAB — PROTIME-INR
INR: 2.06 — ABNORMAL HIGH (ref 0.00–1.49)
PROTHROMBIN TIME: 23.1 s — AB (ref 11.6–15.2)

## 2015-10-11 LAB — BASIC METABOLIC PANEL
ANION GAP: 7 (ref 5–15)
BUN: 42 mg/dL — ABNORMAL HIGH (ref 6–20)
CALCIUM: 8.6 mg/dL — AB (ref 8.9–10.3)
CHLORIDE: 103 mmol/L (ref 101–111)
CO2: 23 mmol/L (ref 22–32)
CREATININE: 1.41 mg/dL — AB (ref 0.61–1.24)
GFR calc Af Amer: 57 mL/min — ABNORMAL LOW (ref >60)
GFR, EST NON AFRICAN AMERICAN: 49 mL/min — AB (ref >60)
GLUCOSE: 92 mg/dL (ref 65–99)
POTASSIUM: 4.2 mmol/L (ref 3.5–5.1)
Sodium: 133 mmol/L — ABNORMAL LOW (ref 135–145)

## 2015-10-11 MED ORDER — WARFARIN SODIUM 2 MG PO TABS
3.0000 mg | ORAL_TABLET | Freq: Once | ORAL | Status: AC
  Administered 2015-10-11: 3 mg via ORAL
  Filled 2015-10-11: qty 1

## 2015-10-11 MED ORDER — SPIRONOLACTONE 25 MG PO TABS: 25.0000 mg | ORAL_TABLET | Freq: Every evening | ORAL | Status: DC

## 2015-10-11 MED ORDER — METOLAZONE 5 MG PO TABS
5.0000 mg | ORAL_TABLET | Freq: Once | ORAL | Status: DC
  Filled 2015-10-11: qty 1

## 2015-10-11 MED ORDER — FUROSEMIDE 10 MG/ML IJ SOLN
80.0000 mg | Freq: Three times a day (TID) | INTRAMUSCULAR | Status: DC
  Administered 2015-10-11 – 2015-10-12 (×4): 80 mg via INTRAVENOUS
  Filled 2015-10-11 (×5): qty 8

## 2015-10-11 NOTE — Progress Notes (Signed)
Pts BP have been 70s/40s to 70s/50s.  Held Daily AM dose of lisinopril today.  Held afternoon dose of Metolazone and Lasix at this time.  Notified Dr Jerilee Hoh about pt BP.  Per Dr Jerilee Hoh hold BP meds at this time, and when pt SPB in 90s give the held dose of metolazone and lasix.  Will continue to monitor pt.

## 2015-10-11 NOTE — Progress Notes (Signed)
[name]   PROGRESS NOTE    Rick [name]  [id number] DOB: 1946-03-11 DOA: 10/07/2015 PCP: Wende Neighbors, MD     Brief Narrative:  70 year old man admitted on 5/4 for acute on chronic combined CHF. Noted to have an ejection fraction of 20%. Here for diuresis.   Assessment & Plan:   Principal Problem:   Acute systolic CHF (congestive heart failure) (HCC) Active Problems:   Essential hypertension   Ischemic cardiomyopathy   Hyperlipidemia   Automatic implantable cardioverter-defibrillator in situ   Acute exacerbation of CHF (congestive heart failure) (HCC)   Anasarca   Normocytic anemia   Acute on chronic combined CHF -Still without adequate diuresis and significant volume overload. -Cardiology has increased lasix to 80 mg TID and given an additional dose of metolazone today. -May need inotropic support if unable to maintain adequate diuresis.  History of PE -Continue Coumadin as dosed by pharmacy.  Normocytic anemia -Anemia panel confirms anemia of chronic disease. -No need for transfusion at present.   DVT prophylaxis: Fully anticoagulated on Coumadin Code Status: Full code Family Communication: Patient only Disposition Plan: To be determined  Consultants:   Cardiology  Procedures:   None  Antimicrobials:   None    Subjective: Shortness of breath has improved, no chest pain  Objective: Filed Vitals:   10/11/15 0658 10/11/15 0828 10/11/15 1007 10/11/15 1009  BP: 85/58 92/60 79/48  74/44  Pulse: 66 67    Temp: 98.2 F (36.8 C)     TempSrc: Oral     Resp: 20     Height:      Weight: 82.101 kg (181 lb)     SpO2: 100%       Intake/Output Summary (Last 24 hours) at 10/11/15 1428 Last data filed at 10/11/15 1012  Gross per 24 hour  Intake    240 ml  Output   2250 ml  Net  -2010 ml   Filed Weights   10/09/15 1442 10/10/15 0500 10/11/15 0658  Weight: 83.915 kg (185 lb) 83.915 kg (185 lb) 82.101 kg (181 lb)    Examination:  General exam: Alert,  awake, oriented x 3 Respiratory system: Clear to auscultation. Respiratory effort normal. Cardiovascular system:RRR. No murmurs, rubs, gallops. Gastrointestinal system: Abdomen is nondistended, soft and nontender. No organomegaly or masses felt. Normal bowel sounds heard. Central nervous system: Alert and oriented. No focal neurological deficits. Extremities: 3+ pedal edema up to thighs bilaterally Skin: No rashes, lesions or ulcers Psychiatry: Judgement and insight appear normal. Mood & affect appropriate.     Data Reviewed: I have personally reviewed following labs and imaging studies  CBC:  Recent Labs Lab 10/07/15 1227 10/09/15 1257 10/10/15 0631  WBC 5.5 5.6 5.6  NEUTROABS 3.6  --   --   HGB 9.8* 9.2* 9.2*  HCT 31.4* 30.2* 30.7*  MCV 82.4 83.0 83.2  PLT 148* 142* [id number]*   Basic Metabolic Panel:  Recent Labs Lab 10/07/15 1227 10/08/15 0547 10/09/15 1257 10/10/15 0631 10/11/15 0426  NA 133* 133* 133* 134* 133*  K 4.0 3.5 4.3 4.4 4.2  CL 101 101 102 102 103  CO2 22 23 24 24 23   GLUCOSE 144* 100* 101* 140* 92  BUN 26* 25* 30* 36* 42*  CREATININE 1.20 1.21 1.30* 1.29* 1.41*  CALCIUM 8.7* 8.7* 8.9 8.9 8.6*   GFR: Estimated Creatinine Clearance: 52.7 mL/min (by C-G formula based on Cr of 1.41). Liver Function Tests:  Recent Labs Lab 10/07/15 1227  AST 33  ALT 15*  ALKPHOS 97  BILITOT 4.8*  PROT 8.0  ALBUMIN 2.8*   No results for input(s): LIPASE, AMYLASE in the last 168 hours. No results for input(s): AMMONIA in the last 168 hours. Coagulation Profile:  Recent Labs Lab 10/07/15 1859 10/08/15 0547 10/09/15 1257 10/10/15 0631 10/11/15 0426  INR 2.29* 2.12* 2.01* 2.05* 2.06*   Cardiac Enzymes:  Recent Labs Lab 10/07/15 1227  TROPONINI <0.03   BNP (last 3 results) No results for input(s): PROBNP in the last 8760 hours. HbA1C: No results for input(s): HGBA1C in the last 72 hours. CBG: No results for input(s): GLUCAP in the last 168  hours. Lipid Profile: No results for input(s): CHOL, HDL, LDLCALC, TRIG, CHOLHDL, LDLDIRECT in the last 72 hours. Thyroid Function Tests: No results for input(s): TSH, T4TOTAL, FREET4, T3FREE, THYROIDAB in the last 72 hours. Anemia Panel: No results for input(s): VITAMINB12, FOLATE, FERRITIN, TIBC, IRON, RETICCTPCT in the last 72 hours. Urine analysis: No results found for: COLORURINE, APPEARANCEUR, LABSPEC, PHURINE, GLUCOSEU, HGBUR, BILIRUBINUR, KETONESUR, PROTEINUR, UROBILINOGEN, NITRITE, LEUKOCYTESUR Sepsis Labs: @LABRCNTIP (procalcitonin:4,lacticidven:4)  )No results found for this or any previous visit (from the past 240 hour(s)).       Radiology Studies: No results found.      Scheduled Meds: . aspirin EC  81 mg Oral Daily  . carvedilol  9.375 mg Oral Q breakfast  . furosemide  80 mg Intravenous TID  . lisinopril  5 mg Oral Daily  . metolazone  5 mg Oral Once  . polyethylene glycol  17 g Oral Daily  . potassium chloride SA  20 mEq Oral Daily  . sodium chloride flush  3 mL Intravenous Q12H  . spironolactone  25 mg Oral QPM  . warfarin  3 mg Oral Once  . Warfarin - Pharmacist Dosing Inpatient   Does not apply Q24H   Continuous Infusions:    LOS: 4 days    Time spent: 25 minutes. Greater than 50% of this time was spent in direct contact with the patient coordinating care.     Lelon Frohlich, MD [facility] Pager [phone]  If 7PM-7AM, please contact night-coverage [email or web address] Password [email or web address] 10/11/2015, 2:28 PM

## 2015-10-11 NOTE — Care Management Important Message (Signed)
Important Message  Patient Details  Name: Rick [name] MRN: [id number] Date of Birth: 1946-05-10   Medicare Important Message Given:  Yes    Alvie Heidelberg, RN 10/11/2015, 1:18 PM

## 2015-10-11 NOTE — Progress Notes (Signed)
Patient ID: Rick [name], male   DOB: 01/08/1946, 70 y.o.   MRN: [id number]     Subjective:    Still with SOB, swelling.   Objective:   Temp:  [98.2 F (36.8 C)-98.3 F (36.8 C)] 98.2 F (36.8 C) (05/08 0658) Pulse Rate:  [66-68] 67 (05/08 0828) Resp:  [20] 20 (05/08 0658) BP: (80-92)/(50-60) 92/60 mmHg (05/08 0828) SpO2:  [98 %-100 %] 100 % (05/08 0658) Weight:  [181 lb (82.101 kg)] 181 lb (82.101 kg) (05/08 0658) Last BM Date: 10/08/15  Filed Weights   10/09/15 1442 10/10/15 0500 10/11/15 0658  Weight: 185 lb (83.915 kg) 185 lb (83.915 kg) 181 lb (82.101 kg)    Intake/Output Summary (Last 24 hours) at 10/11/15 0832 Last data filed at 10/10/15 2316  Gross per 24 hour  Intake      0 ml  Output   1600 ml  Net  -1600 ml    Telemetry: NSR  Exam:  General: NAD  HEENT: sclera clear, throat clear  Resp: crackles bilateral bases  Cardiac: RRR, 3/6 systolic murmur at LLSB, elevated JVD  GI: abdomen soft, NT, ND  MSK: 2+ bilateral LE edema  Neuro: no focal deficits  Psych: appropriate affect  Lab Results:  Basic Metabolic Panel:  Recent Labs Lab 10/09/15 1257 10/10/15 0631 10/11/15 0426  NA 133* 134* 133*  K 4.3 4.4 4.2  CL 102 102 103  CO2 24 24 23   GLUCOSE 101* 140* 92  BUN 30* 36* 42*  CREATININE 1.30* 1.29* 1.41*  CALCIUM 8.9 8.9 8.6*    Liver Function Tests:  Recent Labs Lab 10/07/15 1227  AST 33  ALT 15*  ALKPHOS 97  BILITOT 4.8*  PROT 8.0  ALBUMIN 2.8*    CBC:  Recent Labs Lab 10/07/15 1227 10/09/15 1257 10/10/15 0631  WBC 5.5 5.6 5.6  HGB 9.8* 9.2* 9.2*  HCT 31.4* 30.2* 30.7*  MCV 82.4 83.0 83.2  PLT 148* 142* 130*    Cardiac Enzymes:  Recent Labs Lab 10/07/15 1227  TROPONINI <0.03    BNP: No results for input(s): PROBNP in the last 8760 hours.  Coagulation:  Recent Labs Lab 10/09/15 1257 10/10/15 0631 10/11/15 0426  INR 2.01* 2.05* 2.06*    ECG:   Medications:   Scheduled Medications: .  aspirin EC  81 mg Oral Daily  . carvedilol  9.375 mg Oral Q breakfast  . furosemide  80 mg Intravenous BID  . lisinopril  5 mg Oral Daily  . polyethylene glycol  17 g Oral Daily  . potassium chloride SA  20 mEq Oral Daily  . sodium chloride flush  3 mL Intravenous Q12H  . spironolactone  75 mg Oral QPM  . Warfarin - Pharmacist Dosing Inpatient   Does not apply Q24H     Infusions:     PRN Medications:  sodium chloride, acetaminophen, ondansetron (ZOFRAN) IV, sodium chloride flush     Assessment/Plan   1. ICM/ Acute on Chronic systolic HF/Acute on chronic right sided HF - LVEF 15-20%, he is NYHA III, he has an ICD  - initial response to changing to torsemide as outpatient, however started regaining significant weight and edema, admitted for IV diuresis.  - repeat echo this admit LVEF 15-20%, restrictive diastoilc dysfunction, multiple WMAs, severe RV dysfunction, PASP 36.  -I/Os documentation is incomplete for this admission. Weight had trended down to 181 lbs, roughly down 6 lbs since admission. Uptrend in Cr and BUN. He is on lasix 80mg  IV bid,  he did receive a one time dose of metolazone yesterday 5mg .  - we will increase lasix to 80mg  IV tid, give additional dose of metolazone 5mg . Place foley catheter to follow diuresis more closely and account for any possible urinary retention.  - pending response he may need consideration for evaluation by CHF service, he is not diuresing appropriately, and could potentially require inotropic support.   2. History of PE - remains on coumadin       Carlyle Dolly, M.D.

## 2015-10-11 NOTE — Progress Notes (Signed)
Foley catheter inserted per Dr order, for aggressive diuresis.  14 FR catheter used connected to standard drainage bag, anchoring device in place, pt tolerated well.  Small amount of clear, yellow urine noted in drainage bag.

## 2015-10-11 NOTE — Progress Notes (Signed)
ANTICOAGULATION CONSULT NOTE - follow up  Pharmacy Consult for Coumadin from home Indication: h/o VTE  No Known Allergies  Patient Measurements: Height: 5\' 11"  (180.3 cm) Weight: 181 lb (82.101 kg) IBW/kg (Calculated) : 75.3  Vital Signs: Temp: 98.2 F (36.8 C) (05/08 0658) Temp Source: Oral (05/08 0658) BP: 74/44 mmHg (05/08 1009) Pulse Rate: 67 (05/08 0828)  Labs:  Recent Labs  10/09/15 1257 10/10/15 0631 10/11/15 0426  HGB 9.2* 9.2*  --   HCT 30.2* 30.7*  --   PLT 142* 130*  --   LABPROT 22.7* 22.9* 23.1*  INR 2.01* 2.05* 2.06*  CREATININE 1.30* 1.29* 1.41*   Estimated Creatinine Clearance: 52.7 mL/min (by C-G formula based on Cr of 1.41).  Medical History: Past Medical History  Diagnosis Date  . Essential hypertension   . Myocardial infarction acute ([place])   . Ischemic cardiomyopathy     EF 20% by echo 2013  . Other pulmonary embolism and infarction     On coumadin  . Hyperlipidemia   . DOE (dyspnea on exertion)   . CAD (coronary artery disease)   . CVA (cerebral infarction)   . Anxiety   . Tricuspid regurgitation     Severe  . Pulmonary hypertension (HCC)     56 mm Hg  . Cirrhosis ([place])    Medications:  Prescriptions prior to admission  Medication Sig Dispense Refill Last Dose  . carvedilol (COREG) 6.25 MG tablet Take 9.375 mg by mouth daily.   Past Week at Unknown time  . KLOR-CON M20 20 MEQ tablet TAKE ONE TABLET BY MOUTH ONCE DAILY (Patient taking differently: TAKE ONE TABLET BY MOUTH ONCE DAILY as needed) 30 tablet 6 Past Week at Unknown time  . lisinopril (PRINIVIL,ZESTRIL) 5 MG tablet Take 1 tablet (5 mg total) by mouth daily. 90 tablet 3 Past Week at Unknown time  . polyethylene glycol (MIRALAX / GLYCOLAX) packet Take 17 g by mouth daily as needed.   10/06/2015 at Unknown time  . spironolactone (ALDACTONE) 25 MG tablet Take 75 mg by mouth every evening.   Past Week at Unknown time  . torsemide (DEMADEX) 20 MG tablet Take 40 mg in the morning &  take 20 mg in the evening 60 tablet 3 10/06/2015 at Unknown time  . traMADol (ULTRAM) 50 MG tablet Take by mouth every 6 (six) hours as needed.   unknown at Unknown time  . warfarin (COUMADIN) 3 MG tablet Take 2 tablets daily or as directed 180 tablet 3 10/06/2015 at 1900   Assessment: INR therapeutic on home dose.  Per coumadin clinic notes, home dose is 3mg  daily.  No bleeding reported.    Goal of Therapy:  INR 2-3 Monitor platelets by anticoagulation protocol: Yes   Plan:  Coumadin 3mg  po today x 1 INR daily  Hart Robinsons A 10/11/2015,12:54 PM

## 2015-10-12 LAB — BASIC METABOLIC PANEL
ANION GAP: 10 (ref 5–15)
BUN: 46 mg/dL — ABNORMAL HIGH (ref 6–20)
CALCIUM: 9.1 mg/dL (ref 8.9–10.3)
CO2: 25 mmol/L (ref 22–32)
Chloride: 99 mmol/L — ABNORMAL LOW (ref 101–111)
Creatinine, Ser: 1.41 mg/dL — ABNORMAL HIGH (ref 0.61–1.24)
GFR calc Af Amer: 57 mL/min — ABNORMAL LOW (ref >60)
GFR calc non Af Amer: 49 mL/min — ABNORMAL LOW (ref >60)
GLUCOSE: 168 mg/dL — AB (ref 65–99)
Potassium: 4.4 mmol/L (ref 3.5–5.1)
Sodium: 134 mmol/L — ABNORMAL LOW (ref 135–145)

## 2015-10-12 LAB — PROTIME-INR
INR: 2.01 — ABNORMAL HIGH (ref 0.00–1.49)
Prothrombin Time: 22.6 seconds — ABNORMAL HIGH (ref 11.6–15.2)

## 2015-10-12 MED ORDER — METOLAZONE 5 MG PO TABS
2.5000 mg | ORAL_TABLET | Freq: Once | ORAL | Status: AC
  Administered 2015-10-12: 2.5 mg via ORAL
  Filled 2015-10-12: qty 1

## 2015-10-12 MED ORDER — WARFARIN SODIUM 2 MG PO TABS
3.0000 mg | ORAL_TABLET | Freq: Once | ORAL | Status: AC
  Administered 2015-10-12: 3 mg via ORAL
  Filled 2015-10-12: qty 1

## 2015-10-12 NOTE — Progress Notes (Addendum)
BP rechecked. BP 94/52. HR 67. MD paged about medications and BP. Oswald Hillock, RN

## 2015-10-12 NOTE — Progress Notes (Signed)
Rick [name]   PROGRESS NOTE    Rick [name]  [id number] DOB: 1945-10-26 DOA: 10/07/2015 PCP: Wende Neighbors, MD     Brief Narrative:  70 year old man admitted on 5/4 for acute on chronic combined CHF. Noted to have an ejection fraction of 20%. Here for diuresis.   Assessment & Plan:   Principal Problem:   Acute systolic CHF (congestive heart failure) (HCC) Active Problems:   Essential hypertension   Ischemic cardiomyopathy   Hyperlipidemia   Automatic implantable cardioverter-defibrillator in situ   Acute exacerbation of CHF (congestive heart failure) (HCC)   Anasarca   Normocytic anemia   Acute on chronic combined CHF -Adequate diuresis overnight. -Continue current dose of Lasix, will get an additional dose of metolazone 2.5 mg today.  -I have had to hold his blood pressure medications today as he has been somewhat hypotensive in order to allow for diuresis. -Cardiology is on board and following. History of PE -Continue Coumadin as dosed by pharmacy.  Normocytic anemia -Anemia panel confirms anemia of chronic disease. -No need for transfusion at present.   DVT prophylaxis: Fully anticoagulated on Coumadin Code Status: Full code Family Communication: Patient only Disposition Plan: To be determined  Consultants:   Cardiology  Procedures:   None  Antimicrobials:   None    Subjective: Shortness of breath has improved, no chest pain  Objective: Filed Vitals:   10/12/15 0629 10/12/15 0954 10/12/15 1056 10/12/15 1306  BP: 99/63 78/46 94/52  76/52  Pulse: 71 77 67 67  Temp: 97.7 F (36.5 C)   97.5 F (36.4 C)  TempSrc: Oral   Oral  Resp: 18   18  Height:      Weight:      SpO2: 100%  100% 100%    Intake/Output Summary (Last 24 hours) at 10/12/15 1550 Last data filed at 10/12/15 1306  Gross per 24 hour  Intake    240 ml  Output   3550 ml  Net  -3310 ml   Filed Weights   10/10/15 0500 10/11/15 0658 10/12/15 0624  Weight: 83.915 kg (185 lb) 82.101 kg  (181 lb) 80.786 kg (178 lb 1.6 oz)    Examination:  General exam: Alert, awake, oriented x 3 Respiratory system: Clear to auscultation. Respiratory effort normal. Cardiovascular system:RRR. No murmurs, rubs, gallops. Gastrointestinal system: Abdomen is nondistended, soft and nontender. No organomegaly or masses felt. Normal bowel sounds heard. Central nervous system: Alert and oriented. No focal neurological deficits. Extremities: 3+ pedal edema up to thighs bilaterally Skin: No rashes, lesions or ulcers Psychiatry: Judgement and insight appear normal. Mood & affect appropriate.     Data Reviewed: I have personally reviewed following labs and imaging studies  CBC:  Recent Labs Lab 10/07/15 1227 10/09/15 1257 10/10/15 0631  WBC 5.5 5.6 5.6  NEUTROABS 3.6  --   --   HGB 9.8* 9.2* 9.2*  HCT 31.4* 30.2* 30.7*  MCV 82.4 83.0 83.2  PLT 148* 142* [id number]*   Basic Metabolic Panel:  Recent Labs Lab 10/08/15 0547 10/09/15 1257 10/10/15 0631 10/11/15 0426 10/12/15 0415  NA 133* 133* 134* 133* 134*  K 3.5 4.3 4.4 4.2 4.4  CL 101 102 102 103 99*  CO2 23 24 24 23 25   GLUCOSE 100* 101* 140* 92 168*  BUN 25* 30* 36* 42* 46*  CREATININE 1.21 1.30* 1.29* 1.41* 1.41*  CALCIUM 8.7* 8.9 8.9 8.6* 9.1   GFR: Estimated Creatinine Clearance: 52.7 mL/min (by C-G formula based on Cr of 1.41). Liver Function  Tests:  Recent Labs Lab 10/07/15 1227  AST 33  ALT 15*  ALKPHOS 97  BILITOT 4.8*  PROT 8.0  ALBUMIN 2.8*   No results for input(s): LIPASE, AMYLASE in the last 168 hours. No results for input(s): AMMONIA in the last 168 hours. Coagulation Profile:  Recent Labs Lab 10/08/15 0547 10/09/15 1257 10/10/15 0631 10/11/15 0426 10/12/15 0415  INR 2.12* 2.01* 2.05* 2.06* 2.01*   Cardiac Enzymes:  Recent Labs Lab 10/07/15 1227  TROPONINI <0.03   BNP (last 3 results) No results for input(s): PROBNP in the last 8760 hours. HbA1C: No results for input(s): HGBA1C in the  last 72 hours. CBG: No results for input(s): GLUCAP in the last 168 hours. Lipid Profile: No results for input(s): CHOL, HDL, LDLCALC, TRIG, CHOLHDL, LDLDIRECT in the last 72 hours. Thyroid Function Tests: No results for input(s): TSH, T4TOTAL, FREET4, T3FREE, THYROIDAB in the last 72 hours. Anemia Panel: No results for input(s): VITAMINB12, FOLATE, FERRITIN, TIBC, IRON, RETICCTPCT in the last 72 hours. Urine analysis: No results found for: COLORURINE, APPEARANCEUR, LABSPEC, PHURINE, GLUCOSEU, HGBUR, BILIRUBINUR, KETONESUR, PROTEINUR, UROBILINOGEN, NITRITE, LEUKOCYTESUR Sepsis Labs: @LABRCNTIP (procalcitonin:4,lacticidven:4)  )No results found for this or any previous visit (from the past 240 hour(s)).       Radiology Studies: No results found.      Scheduled Meds: . aspirin EC  81 mg Oral Daily  . carvedilol  9.375 mg Oral Q breakfast  . furosemide  80 mg Intravenous TID  . lisinopril  5 mg Oral Daily  . metolazone  2.5 mg Oral Once  . metolazone  5 mg Oral Once  . polyethylene glycol  17 g Oral Daily  . potassium chloride SA  20 mEq Oral Daily  . sodium chloride flush  3 mL Intravenous Q12H  . spironolactone  25 mg Oral QPM  . warfarin  3 mg Oral Once  . Warfarin - Pharmacist Dosing Inpatient   Does not apply Q24H   Continuous Infusions:    LOS: 5 days    Time spent: 25 minutes. Greater than 50% of this time was spent in direct contact with the patient coordinating care.     Lelon Frohlich, MD [facility] Pager [phone]  If 7PM-7AM, please contact night-coverage [email or web address] Password [email or web address] 10/12/2015, 3:50 PM

## 2015-10-12 NOTE — Progress Notes (Signed)
Patient ID: Rick [name], male   DOB: [birth year], 70 y.o.   MRN: [id number]     Subjective:    SOB improving.   Objective:   Temp:  [97.7 F (36.5 C)-98.9 F (37.2 C)] 97.7 F (36.5 C) (05/09 0629) Pulse Rate:  [62-71] 71 (05/09 0629) Resp:  [18] 18 (05/09 0629) BP: (71-99)/(44-76) 99/63 mmHg (05/09 0629) SpO2:  [100 %] 100 % (05/09 0629) Weight:  [178 lb 1.6 oz (80.786 kg)] 178 lb 1.6 oz (80.786 kg) (05/09 0624) Last BM Date: 10/11/15  Filed Weights   10/10/15 0500 10/11/15 0658 10/12/15 0624  Weight: 185 lb (83.915 kg) 181 lb (82.101 kg) 178 lb 1.6 oz (80.786 kg)    Intake/Output Summary (Last 24 hours) at 10/12/15 0904 Last data filed at 10/12/15 [id number]  Gross per 24 hour  Intake    240 ml  Output   4050 ml  Net  -3810 ml    Telemetry: SR, occas PVCs  Exam:  General: NAD  HEENT: sclera clear, throat clear  Resp: CTAB  Cardiac: RRR, 3/6 systolic murmur LLSB, JVD elevated  GI: abdomden soft, NT, ND  MSK: 1+ bilateral LE edema  Neuro: no focal deficits  Psych: appropriate afefct  Lab Results:  Basic Metabolic Panel:  Recent Labs Lab 10/10/15 0631 10/11/15 0426 10/12/15 0415  NA 134* 133* 134*  K 4.4 4.2 4.4  CL 102 103 99*  CO2 24 23 25   GLUCOSE 140* 92 168*  BUN 36* 42* 46*  CREATININE 1.29* 1.41* 1.41*  CALCIUM 8.9 8.6* 9.1    Liver Function Tests:  Recent Labs Lab 10/07/15 1227  AST 33  ALT 15*  ALKPHOS 97  BILITOT 4.8*  PROT 8.0  ALBUMIN 2.8*    CBC:  Recent Labs Lab 10/07/15 1227 10/09/15 1257 10/10/15 0631  WBC 5.5 5.6 5.6  HGB 9.8* 9.2* 9.2*  HCT 31.4* 30.2* 30.7*  MCV 82.4 83.0 83.2  PLT 148* 142* 130*    Cardiac Enzymes:  Recent Labs Lab 10/07/15 1227  TROPONINI <0.03    BNP: No results for input(s): PROBNP in the last 8760 hours.  Coagulation:  Recent Labs Lab 10/10/15 0631 10/11/15 0426 10/12/15 0415  INR 2.05* 2.06* 2.01*    ECG:   Medications:   Scheduled Medications: . aspirin EC   81 mg Oral Daily  . carvedilol  9.375 mg Oral Q breakfast  . furosemide  80 mg Intravenous TID  . lisinopril  5 mg Oral Daily  . metolazone  5 mg Oral Once  . polyethylene glycol  17 g Oral Daily  . potassium chloride SA  20 mEq Oral Daily  . sodium chloride flush  3 mL Intravenous Q12H  . spironolactone  25 mg Oral QPM  . Warfarin - Pharmacist Dosing Inpatient   Does not apply Q24H     Infusions:     PRN Medications:  sodium chloride, acetaminophen, ondansetron (ZOFRAN) IV, sodium chloride flush     Assessment/Plan    1. ICM/ Acute on Chronic systolic HF/Acute on chronic right sided HF - LVEF 15-20%, he is NYHA III, he has an ICD  - significant progression in his heart failure symptoms over the last few months with weight gain and fluid retention. Initially had improvement with change to torsemide as outpatient, however edema and weight began increase once again. Admitted from clinic for IV diuresis.  - repeat echo this admit LVEF 15-20%, restrictive diastoilc dysfunction, multiple WMAs, severe RV dysfunction, PASP 36.  -I/Os  documentation overall is incomplete for this admission. Overall has only mildly diuresed. Yesterday lasix increased to 80mg  tid and given 5mg  of metolazone and foley placed. Negative 3.5 liters yesterday.  - Weight has trended down to 178 lbs, roughly down 9 lbs since admission. Mild uptrend in Cr and BUN that is stable today. He is on lasix 80mg  IV tid, he has received metolazone 5mg  PO for the last 2 days.   - good diuretic response yesterday. We will repeat metolazone but at 2.5mg , continue lasix 80mg  IV tid. I think outpatient CHF clinic evaluation is reasonable given his improved diuresis as opposed to inpatient.  - potential discharge tomorrow pending diuresis today.   2. History of PE - remains on coumadin       Carlyle Dolly, M.D.

## 2015-10-12 NOTE — Consult Note (Signed)
   [facility] CM Inpatient Consult   10/12/2015  Rick [name] Oct 28, 1945 [id number]  Spoke with patient at bedside regarding [facility] services, explained that he would receive post hospital calls and that he could be met at his primary care office for in person assessments [place]. Patient does not wish to sign up for [facility] program services at this time, however he was given a brochure and RNCM contact for future reference if he should change  His mind regarding participation with the [facility] program. Of note, [facility] Care Management services would not replace or interfere with any services that are arranged by inpatient case management or social work. For additional questions or referrals please contact:  Royetta Crochet. Laymond Purser, RN, BSN, [facility] Liaison [phone]

## 2015-10-12 NOTE — Progress Notes (Signed)
ANTICOAGULATION CONSULT NOTE - follow up  Pharmacy Consult for Coumadin from home Indication: h/o VTE  No Known Allergies  Patient Measurements: Height: 5\' 11"  (180.3 cm) Weight: 178 lb 1.6 oz (80.786 kg) IBW/kg (Calculated) : 75.3  Vital Signs: Temp: 97.7 F (36.5 C) (05/09 0629) Temp Source: Oral (05/09 0629) BP: 99/63 mmHg (05/09 0629) Pulse Rate: 71 (05/09 0629)  Labs:  Recent Labs  10/09/15 1257 10/10/15 0631 10/11/15 0426 10/12/15 0415  HGB 9.2* 9.2*  --   --   HCT 30.2* 30.7*  --   --   PLT 142* 130*  --   --   LABPROT 22.7* 22.9* 23.1* 22.6*  INR 2.01* 2.05* 2.06* 2.01*  CREATININE 1.30* 1.29* 1.41* 1.41*   Estimated Creatinine Clearance: 52.7 mL/min (by C-G formula based on Cr of 1.41).  Medical History: Past Medical History  Diagnosis Date  . Essential hypertension   . Myocardial infarction acute ([place])   . Ischemic cardiomyopathy     EF 20% by echo 2013  . Other pulmonary embolism and infarction     On coumadin  . Hyperlipidemia   . DOE (dyspnea on exertion)   . CAD (coronary artery disease)   . CVA (cerebral infarction)   . Anxiety   . Tricuspid regurgitation     Severe  . Pulmonary hypertension (HCC)     56 mm Hg  . Cirrhosis ([place])    Medications:  Prescriptions prior to admission  Medication Sig Dispense Refill Last Dose  . carvedilol (COREG) 6.25 MG tablet Take 9.375 mg by mouth daily.   Past Week at Unknown time  . KLOR-CON M20 20 MEQ tablet TAKE ONE TABLET BY MOUTH ONCE DAILY (Patient taking differently: TAKE ONE TABLET BY MOUTH ONCE DAILY as needed) 30 tablet 6 Past Week at Unknown time  . lisinopril (PRINIVIL,ZESTRIL) 5 MG tablet Take 1 tablet (5 mg total) by mouth daily. 90 tablet 3 Past Week at Unknown time  . polyethylene glycol (MIRALAX / GLYCOLAX) packet Take 17 g by mouth daily as needed.   10/06/2015 at Unknown time  . spironolactone (ALDACTONE) 25 MG tablet Take 75 mg by mouth every evening.   Past Week at Unknown time  .  torsemide (DEMADEX) 20 MG tablet Take 40 mg in the morning & take 20 mg in the evening 60 tablet 3 10/06/2015 at Unknown time  . traMADol (ULTRAM) 50 MG tablet Take by mouth every 6 (six) hours as needed.   unknown at Unknown time  . warfarin (COUMADIN) 3 MG tablet Take 2 tablets daily or as directed 180 tablet 3 10/06/2015 at 1900   Assessment: INR remains therapeutic on home dose.  Per coumadin clinic notes, home dose is 3mg  daily.  No bleeding reported.    Goal of Therapy:  INR 2-3 Monitor platelets by anticoagulation protocol: Yes   Plan:  Coumadin 3mg  po today x 1 INR daily  Vivion Romano Poteet 10/12/2015,9:02 AM

## 2015-10-12 NOTE — Progress Notes (Signed)
MD ordered to hold coreg, lisinopril and spirolactone. Give IV lasix for Diuresis. RN will continue to monitor pt. Oswald Hillock, RN

## 2015-10-12 NOTE — Progress Notes (Signed)
BP 78/46. Asymptomatic and no distress. Medications held. MD notified/paged.

## 2015-10-13 DIAGNOSIS — E785 Hyperlipidemia, unspecified: Secondary | ICD-10-CM

## 2015-10-13 LAB — BASIC METABOLIC PANEL
ANION GAP: 7 (ref 5–15)
BUN: 44 mg/dL — AB (ref 6–20)
CHLORIDE: 96 mmol/L — AB (ref 101–111)
CO2: 28 mmol/L (ref 22–32)
Calcium: 9.2 mg/dL (ref 8.9–10.3)
Creatinine, Ser: 1.38 mg/dL — ABNORMAL HIGH (ref 0.61–1.24)
GFR calc Af Amer: 59 mL/min — ABNORMAL LOW (ref >60)
GFR calc non Af Amer: 51 mL/min — ABNORMAL LOW (ref >60)
GLUCOSE: 184 mg/dL — AB (ref 65–99)
POTASSIUM: 4.1 mmol/L (ref 3.5–5.1)
SODIUM: 131 mmol/L — AB (ref 135–145)

## 2015-10-13 LAB — PROTIME-INR
INR: 1.98 — ABNORMAL HIGH (ref 0.00–1.49)
Prothrombin Time: 22.4 seconds — ABNORMAL HIGH (ref 11.6–15.2)

## 2015-10-13 LAB — MAGNESIUM: MAGNESIUM: 2.1 mg/dL (ref 1.7–2.4)

## 2015-10-13 MED ORDER — METOLAZONE 5 MG PO TABS
2.5000 mg | ORAL_TABLET | Freq: Once | ORAL | Status: AC
  Administered 2015-10-13: 2.5 mg via ORAL
  Filled 2015-10-13: qty 1

## 2015-10-13 MED ORDER — TORSEMIDE 20 MG PO TABS
60.0000 mg | ORAL_TABLET | Freq: Every day | ORAL | Status: DC
  Administered 2015-10-13: 60 mg via ORAL
  Filled 2015-10-13: qty 3

## 2015-10-13 MED ORDER — ASPIRIN 81 MG PO TBEC: 81.0000 mg | DELAYED_RELEASE_TABLET | Freq: Every day | ORAL | Status: DC

## 2015-10-13 MED ORDER — TORSEMIDE 20 MG PO TABS: ORAL_TABLET | ORAL | Status: DC

## 2015-10-13 MED ORDER — LISINOPRIL 5 MG PO TABS: 2.5000 mg | ORAL_TABLET | Freq: Every day | ORAL | Status: DC

## 2015-10-13 MED ORDER — METOLAZONE 5 MG PO TABS: 2.5000 mg | ORAL_TABLET | Freq: Every day | ORAL | Status: DC

## 2015-10-13 MED ORDER — METOLAZONE 5 MG PO TABS: 2.5000 mg | ORAL_TABLET | ORAL | Status: DC

## 2015-10-13 MED ORDER — WARFARIN SODIUM 2 MG PO TABS: 4.0000 mg | ORAL_TABLET | Freq: Once | ORAL | Status: DC

## 2015-10-13 MED ORDER — TORSEMIDE 20 MG PO TABS: 40.0000 mg | ORAL_TABLET | Freq: Every evening | ORAL | Status: DC

## 2015-10-13 NOTE — Care Management Note (Signed)
Case Management Note  Patient Details  Name: Rick [name] MRN: [id number] Date of Birth: [birth year]  Subjective/Objective:Spoke with patient and spouse at the bedside prior to discharge..Patietn standing up at the bedside and finishing dressing himself. No ss of distress patient states that he drives. Spouse drives as well. Patient stated that he  is able to get his medications except for his Spiriva?/ Avair ? Unsure of med.  Patient given resources for applying for assistance with medication.  Patient and spouse expressed appreciation for help with meds.                  Action/Plan: Home with self care.   Expected Discharge Date:                  Expected Discharge Plan:  Home/Self Care  In-House Referral:     Discharge planning Services  CM Consult  Post Acute Care Choice:    Choice offered to:     DME Arranged:    DME Agency:     HH Arranged:    [place] Agency:     Status of Service:  Completed, signed off  Medicare Important Message Given:  Yes Date Medicare IM Given:    Medicare IM give by:    Date Additional Medicare IM Given:    Additional Medicare Important Message give by:     If discussed at [place] of Stay Meetings, dates discussed:    Additional Comments:  Alvie Heidelberg, RN 10/13/2015, 1:00 PM

## 2015-10-13 NOTE — Discharge Instructions (Signed)
Heart Failure  Heart failure means your heart has trouble pumping blood. This makes it hard for your body to work well. Heart failure is usually a long-term (chronic) condition. You must take good care of yourself and follow your doctor's treatment plan.  HOME CARE   Take your heart medicine as told by your doctor.    Do not stop taking medicine unless your doctor tells you to.    Do not skip any dose of medicine.    Refill your medicines before they run out.    Take other medicines only as told by your doctor or pharmacist.   Stay active if told by your doctor. The elderly and people with severe heart failure should talk with a doctor about physical activity.   Eat heart-healthy foods. Choose foods that are without trans fat and are low in saturated fat, cholesterol, and salt (sodium). This includes fresh or frozen fruits and vegetables, fish, lean meats, fat-free or low-fat dairy foods, whole grains, and high-fiber foods. Lentils and dried peas and beans (legumes) are also good choices.   Limit salt if told by your doctor.   Cook in a healthy way. Roast, grill, broil, bake, poach, steam, or stir-fry foods.   Limit fluids as told by your doctor.   Weigh yourself every morning. Do this after you pee (urinate) and before you eat breakfast. Write down your weight to give to your doctor.   Take your blood pressure and write it down if your doctor tells you to.   Ask your doctor how to check your pulse. Check your pulse as told.   Lose weight if told by your doctor.   Stop smoking or chewing tobacco. Do not use gum or patches that help you quit without your doctor's approval.   Schedule and go to doctor visits as told.   Nonpregnant women should have no more than 1 drink a day. Men should have no more than 2 drinks a day. Talk to your doctor about drinking alcohol.   Stop illegal drug use.   Stay current with shots (immunizations).   Manage your health conditions as told by your doctor.   Learn to  manage your stress.   Rest when you are tired.   If it is really hot outside:    Avoid intense activities.    Use air conditioning or fans, or get in a cooler place.    Avoid caffeine and alcohol.    Wear loose-fitting, lightweight, and light-colored clothing.   If it is really cold outside:    Avoid intense activities.    Layer your clothing.    Wear mittens or gloves, a hat, and a scarf when going outside.    Avoid alcohol.   Learn about heart failure and get support as needed.   Get help to maintain or improve your quality of life and your ability to care for yourself as needed.  GET HELP IF:    You gain weight quickly.   You are more short of breath than usual.   You cannot do your normal activities.   You tire easily.   You cough more than normal, especially with activity.   You have any or more puffiness (swelling) in areas such as your hands, feet, ankles, or belly (abdomen).   You cannot sleep because it is hard to breathe.   You feel like your heart is beating fast (palpitations).   You get dizzy or light-headed when you stand up.  GET HELP   RIGHT AWAY IF:    You have trouble breathing.   There is a change in mental status, such as becoming less alert or not being able to focus.   You have chest pain or discomfort.   You faint.  MAKE SURE YOU:    Understand these instructions.   Will watch your condition.   Will get help right away if you are not doing well or get worse.     This information is not intended to replace advice given to you by your health care provider. Make sure you discuss any questions you have with your health care provider.     Document Released: 02/29/2008 Document Revised: 06/12/2014 Document Reviewed: 07/08/2012  Elsevier Interactive Patient Education 2016 [employer]

## 2015-10-13 NOTE — Progress Notes (Signed)
Subjective:   SOB improving   Objective:   Temp:  [97.5 F (36.4 C)-98.3 F (36.8 C)] 98.3 F (36.8 C) (05/10 0622) Pulse Rate:  [67-87] 87 (05/10 0622) Resp:  [18] 18 (05/10 0622) BP: (76-101)/(46-68) 101/68 mmHg (05/10 0622) SpO2:  [100 %] 100 % (05/10 0622) Weight:  [172 lb 1.6 oz (78.064 kg)] 172 lb 1.6 oz (78.064 kg) (05/10 0500) Last BM Date: 10/12/15  Filed Weights   10/11/15 0658 10/12/15 0624 10/13/15 0500  Weight: 181 lb (82.101 kg) 178 lb 1.6 oz (80.786 kg) 172 lb 1.6 oz (78.064 kg)    Intake/Output Summary (Last 24 hours) at 10/13/15 0828 Last data filed at 10/13/15 0651  Gross per 24 hour  Intake    480 ml  Output   4100 ml  Net  -3620 ml    Telemetry: SR, PVCs  Exam:  General: NAD  HEENT: sclera clear, throat clear  Resp: CTAB  Cardiac: RRR, 3/6 systolic murmur LLSB  GI:  Abdomen soft, NT, ND  MSK: 1+ bilateral LE edema  Neuro: no focal deficits  Psych: appropriate affect  Lab Results:  Basic Metabolic Panel:  Recent Labs Lab 10/10/15 0631 10/11/15 0426 10/12/15 0415  NA 134* 133* 134*  K 4.4 4.2 4.4  CL 102 103 99*  CO2 24 23 25   GLUCOSE 140* 92 168*  BUN 36* 42* 46*  CREATININE 1.29* 1.41* 1.41*  CALCIUM 8.9 8.6* 9.1    Liver Function Tests:  Recent Labs Lab 10/07/15 1227  AST 33  ALT 15*  ALKPHOS 97  BILITOT 4.8*  PROT 8.0  ALBUMIN 2.8*    CBC:  Recent Labs Lab 10/07/15 1227 10/09/15 1257 10/10/15 0631  WBC 5.5 5.6 5.6  HGB 9.8* 9.2* 9.2*  HCT 31.4* 30.2* 30.7*  MCV 82.4 83.0 83.2  PLT 148* 142* 130*    Cardiac Enzymes:  Recent Labs Lab 10/07/15 1227  TROPONINI <0.03    BNP: No results for input(s): PROBNP in the last 8760 hours.  Coagulation:  Recent Labs Lab 10/11/15 0426 10/12/15 0415 10/13/15 0530  INR 2.06* 2.01* 1.98*    ECG:   Medications:   Scheduled Medications: . aspirin EC  81 mg Oral Daily  . carvedilol  9.375 mg Oral Q breakfast  . furosemide  80 mg  Intravenous TID  . lisinopril  5 mg Oral Daily  . metolazone  5 mg Oral Once  . polyethylene glycol  17 g Oral Daily  . potassium chloride SA  20 mEq Oral Daily  . sodium chloride flush  3 mL Intravenous Q12H  . spironolactone  25 mg Oral QPM  . Warfarin - Pharmacist Dosing Inpatient   Does not apply Q24H     Infusions:     PRN Medications:  sodium chloride, acetaminophen, ondansetron (ZOFRAN) IV, sodium chloride flush     Assessment/Plan   1. ICM/ Acute on Chronic systolic HF/Acute on chronic right sided HF - LVEF 15-20%, he is NYHA III, he has an ICD  - significant progression in his heart failure symptoms over the last few months with weight gain and fluid retention. Initially had improvement with change to torsemide as outpatient, however edema and weight began increase once again. Admitted from clinic for IV diuresis.  - repeat echo this admit LVEF 15-20%, restrictive diastoilc dysfunction, multiple WMAs, severe RV dysfunction, PASP 36.  - total I/Os documentation overall is incomplete for this admission. He is negative 3.6 liters yesterday, over the last 2  days negative over 7 liters. He has been on lasix 80mg  IV tid with intermittent oral metolazone. Renal function has been overall stable, labs are pending today.   - Weight has trended down to 172 lbs, roughly down 15 lbs since admission.  - medical therapy limited by soft bp's, he remains on low dose coreg and lisionpril. Will lower lisionpril to 2.5mg  daily  - will d/c foley and have patient ambulate with nursing staff. F/u AM labs, I have stopped diuretics until labs return. Pending results likely discharge later today, will need f/u with [place] long with BMET and Mg in 1 week. Add discharge would increase torsemide to 60mg  in AM and 40mg  in PM and also given metolazone 2.5mg  prn once daily for weight gain above 3 lbs baseline. We will also arrange f/u with CHF clinic.   2. History of PE - remains on  coumadin        Carlyle Dolly, M.D.

## 2015-10-13 NOTE — Progress Notes (Signed)
ANTICOAGULATION CONSULT NOTE - follow up  Pharmacy Consult for Coumadin from home Indication: h/o VTE  No Known Allergies  Patient Measurements: Height: 5\' 11"  (180.3 cm) Weight: 172 lb 1.6 oz (78.064 kg) IBW/kg (Calculated) : 75.3  Vital Signs: Temp: 98.3 F (36.8 C) (05/10 0622) Temp Source: Oral (05/10 0622) BP: 101/68 mmHg (05/10 0622) Pulse Rate: 87 (05/10 0622)  Labs:  Recent Labs  10/11/15 0426 10/12/15 0415 10/13/15 0530  LABPROT 23.1* 22.6* 22.4*  INR 2.06* 2.01* 1.98*  CREATININE 1.41* 1.41*  --    Estimated Creatinine Clearance: 52.7 mL/min (by C-G formula based on Cr of 1.41).  Medical History: Past Medical History  Diagnosis Date  . Essential hypertension   . Myocardial infarction acute ([place])   . Ischemic cardiomyopathy     EF 20% by echo 2013  . Other pulmonary embolism and infarction     On coumadin  . Hyperlipidemia   . DOE (dyspnea on exertion)   . CAD (coronary artery disease)   . CVA (cerebral infarction)   . Anxiety   . Tricuspid regurgitation     Severe  . Pulmonary hypertension (HCC)     56 mm Hg  . Cirrhosis ([place])    Medications:  Prescriptions prior to admission  Medication Sig Dispense Refill Last Dose  . carvedilol (COREG) 6.25 MG tablet Take 9.375 mg by mouth daily.   Past Week at Unknown time  . KLOR-CON M20 20 MEQ tablet TAKE ONE TABLET BY MOUTH ONCE DAILY (Patient taking differently: TAKE ONE TABLET BY MOUTH ONCE DAILY as needed) 30 tablet 6 Past Week at Unknown time  . lisinopril (PRINIVIL,ZESTRIL) 5 MG tablet Take 1 tablet (5 mg total) by mouth daily. 90 tablet 3 Past Week at Unknown time  . polyethylene glycol (MIRALAX / GLYCOLAX) packet Take 17 g by mouth daily as needed.   10/06/2015 at Unknown time  . spironolactone (ALDACTONE) 25 MG tablet Take 75 mg by mouth every evening.   Past Week at Unknown time  . torsemide (DEMADEX) 20 MG tablet Take 40 mg in the morning & take 20 mg in the evening 60 tablet 3 10/06/2015 at Unknown  time  . traMADol (ULTRAM) 50 MG tablet Take by mouth every 6 (six) hours as needed.   unknown at Unknown time  . warfarin (COUMADIN) 3 MG tablet Take 2 tablets daily or as directed 180 tablet 3 10/06/2015 at 1900   Assessment: INR has been therapeutic but on low end of range.  Today INR has dropped below 2.  Per coumadin clinic notes, home dose is 3mg  daily.  Will increase dose to 4mg  daily for now and evaluate response.  No bleeding reported.    Goal of Therapy:  INR 2-3 Monitor platelets by anticoagulation protocol: Yes   Plan:  Coumadin 4mg  po today x 1 INR daily  Shandee Jergens A 10/13/2015,8:53 AM

## 2015-10-13 NOTE — Progress Notes (Signed)
SATURATION QUALIFICATIONS: (This note is used to comply with regulatory documentation for home oxygen)  Patient Saturations on Room Air at Rest = 100%  Patient Saturations on Room Air while Ambulating = 96%   Pt's O2 stat dropped to 88% for a brief moment. Asymptomatic at that time.  HR went to 39 at one moment and then held 74-76 HR throughout walk. Pt only complained of SOB throughout walk. MD Branch notified. No order for oxygen at this time. RN will continue to monitor. Oswald Hillock, RN

## 2015-10-13 NOTE — Progress Notes (Signed)
Foley catheter D/C. Pt has urinated. Oswald Hillock, RN

## 2015-10-13 NOTE — Discharge Summary (Signed)
Physician Discharge Summary  Rick [name] [id number] DOB: 21-Apr-1946 DOA: 10/07/2015  PCP: Wende Neighbors, MD  Admit date: 10/07/2015 Discharge date: 10/13/2015  Time spent:35 minutes  Recommendations for Outpatient Follow-up:  1. Follow up with Dr Harl Bowie as scheduled by him. 2. Follow up with your PCP as scheduled.     Discharge Diagnoses:  Principal Problem:   Acute systolic CHF (congestive heart failure) (HCC) Active Problems:   Essential hypertension   Ischemic cardiomyopathy   Hyperlipidemia   Automatic implantable cardioverter-defibrillator in situ   Acute exacerbation of CHF (congestive heart failure) (HCC)   Anasarca   Normocytic anemia   Discharge Condition: improved.  BP soft, but asymptomatic.  Cr remained stable, not in heart failure.   Diet recommendation: Low salt diet.   Filed Weights   10/11/15 0658 10/12/15 0624 10/13/15 0500  Weight: 82.101 kg (181 lb) 80.786 kg (178 lb 1.6 oz) 78.064 kg (172 lb 1.6 oz)    History of present illness: Patient was admitted with lower extremity edema, felt to have acute on chronic combined CHF by Dr Jerilee Hoh on May 9th, 2017.  As per her H and P:  " Rick [name] is a 70 y.o. male with multiple medical comorbidities and also significant for ischemic cardiomyopathy and systolic CHF with unknown ejection fraction of less than 20% status post AICD placement, history of pulmonary embolism on Coumadin, hypertension, hyperlipidemia pulmonary hypertension among other things who presents today from the cardiologist's office for treatment of volume overload. Patient was recently switched from Lasix to torsemide and his weight initially dropped by to both pounds. Cardiology notes state that his weight decline leveled off and his torsemide was subsequently increased to 40 mg in the morning and 20 mg at night but he continues to gain weight. He states his weight has increased from 172-187. He was noted to have 3+ pitting edema up to his hips with  increased shortness of breath and dyspnea on exertion as well as orthopnea and was referred to the hospital for the purpose of diuresis.   Hospital Course: patient was admitted into the hospital, and he was aggressively diuresed.  His Cr remained relatively stable at 1.3-1.4, and he was seen in consultation with Dr Harl Bowie of cardiology.  He was negative close to 10 liters, and his blood pressure has been soft, requiring discontinuation of his ACE I.  His ECHO showed LVEF of 15 to 20 percent, and he has an ICD.  For his PE, he was continued on his Coumadin.  Per cardiology, he is stable for discharge, with follow up at the outpatient CHF clinic.  This will be set up by Dr Harl Bowie.  He was recommended to be discharged on Demadex 60mg  in the am, and 40mg  in the pm, and if he gains more than 3 lbs from his baseline, he is to take [place] 2.5mg  with his other meds.  His Lisinopril 5mg  was half in the hospital, and I will d/c it for now upon discharge, as his BP is low at [id number] systolic, asymptomatically.  He is anxious to go home, and is stable for discharge.  Thank you and Good Day.   Consultations:  Cardiology: Dr Carlyle Dolly.   Discharge Exam: Filed Vitals:   10/13/15 0622 10/13/15 0938  BP: 101/68 93/56  Pulse: 87 74  Temp: 98.3 F (36.8 C)   Resp: 18      Discharge Instructions    Diet - low sodium heart healthy    Complete by:  As directed      Discharge instructions    Complete by:  As directed   Follow up with your heart doctor at the CHF clinic.  Take your medicine as directed.  Weight yourself daily, and if you gain more than 3 lbs, take your Metalazone 2.5mg  with your other meds.     Increase activity slowly    Complete by:  As directed           Current Discharge Medication List    START taking these medications   Details  aspirin EC 81 MG EC tablet Take 1 tablet (81 mg total) by mouth daily. Qty: 100 tablet, Refills: 1    metolazone (ZAROXOLYN) 5 MG tablet Take 0.5  tablets (2.5 mg total) by mouth daily. Qty: 30 tablet, Refills: 1      CONTINUE these medications which have CHANGED   Details  torsemide (DEMADEX) 20 MG tablet Take 60 mg in the morning & take 40 mg in the evening Qty: 60 tablet, Refills: 3      CONTINUE these medications which have NOT CHANGED   Details  carvedilol (COREG) 6.25 MG tablet Take 9.375 mg by mouth daily.    KLOR-CON M20 20 MEQ tablet TAKE ONE TABLET BY MOUTH ONCE DAILY Qty: 30 tablet, Refills: 6    polyethylene glycol (MIRALAX / GLYCOLAX) packet Take 17 g by mouth daily as needed.    traMADol (ULTRAM) 50 MG tablet Take by mouth every 6 (six) hours as needed.    warfarin (COUMADIN) 3 MG tablet Take 2 tablets daily or as directed Qty: 180 tablet, Refills: 3      STOP taking these medications     lisinopril (PRINIVIL,ZESTRIL) 5 MG tablet      spironolactone (ALDACTONE) 25 MG tablet        No Known Allergies Follow-up Information    Follow up with Ermalinda Barrios, PA-C On 10/20/2015.   Specialty:  Cardiology   Why:  at 1:40 pm   Contact information:   [address] [place] [address] [phone]       Follow up with [facility] On 10/21/2015.   Why:  at 1:30 pm   Contact information:   [address] [place] [address] [phone]       The results of significant diagnostics from this hospitalization (including imaging, microbiology, ancillary and laboratory) are listed below for reference.    Significant Diagnostic Studies: Dg Chest 2 View  10/07/2015  CLINICAL DATA:  Lower extremity swelling EXAM: CHEST  2 VIEW COMPARISON:  09/01/2015 chest radiograph. FINDINGS: Sternotomy wires appear aligned and intact. Stable configuration of 2 lead left subclavian ICD. Stable cardiomediastinal silhouette with mild-to-moderate cardiomegaly. No pneumothorax. No pleural effusion. No overt pulmonary edema. No acute consolidative airspace disease. IMPRESSION: Stable cardiomegaly without overt  pulmonary edema. Electronically Signed   By: Ilona Sorrel M.D.   On: 10/07/2015 12:45   Ct Abd Wo & W Cm  09/15/2015  CLINICAL DATA:  Abnormal ultrasound, gallbladder mass. Bilateral lower quadrant pain. EXAM: CT ABDOMEN WITHOUT AND WITH CONTRAST TECHNIQUE: Multidetector CT imaging of the abdomen was performed following the standard protocol before and following the bolus administration of intravenous contrast. CONTRAST:  158mL ISOVUE-300 IOPAMIDOL (ISOVUE-300) INJECTION 61% COMPARISON:  Abdominal ultrasound 07/30/2015 and CT abdomen pelvis 12/04/2014. FINDINGS: Lower chest: Lung bases show a small right pleural effusion with minimal compressive atelectasis in the right lower lobe. Heart is enlarged. Coronary artery calcification. No pericardial effusion.  Hepatobiliary: Reflux of contrast into dilated IVC and hepatic veins. Liver is grossly unremarkable. Gallbladder wall thickening better visualized on 07/30/2015. No definite mass. No biliary ductal dilatation. Pancreas: Negative. Spleen: Negative. Adrenals/Urinary Tract: Adrenal glands are unremarkable. Scattered low attenuation lesions in the kidneys are too small to characterize. Bilateral renal cortical scarring. Stomach/Bowel: Stomach and visualized portions of the small bowel, appendix and colon are unremarkable. Vascular/Lymphatic: Atherosclerotic calcification of the arterial vasculature without abdominal aortic aneurysm. No pathologically enlarged lymph nodes. Other: Small ascites. Musculoskeletal: No worrisome lytic or sclerotic lesions. IMPRESSION: 1. No gallbladder mass. Gallbladder wall thickening better seen on 07/30/2015. 2. Small right pleural effusion. 3. Small ascites. 4. Coronary artery calcification. Electronically Signed   By: Lorin Picket M.D.   On: 09/15/2015 12:18    Labs: Basic Metabolic Panel:  Recent Labs Lab 10/09/15 1257 10/10/15 0631 10/11/15 0426 10/12/15 0415 10/13/15 0927  NA 133* 134* 133* 134* 131*  K 4.3 4.4  4.2 4.4 4.1  CL 102 102 103 99* 96*  CO2 24 24 23 25 28   GLUCOSE 101* 140* 92 168* 184*  BUN 30* 36* 42* 46* 44*  CREATININE 1.30* 1.29* 1.41* 1.41* 1.38*  CALCIUM 8.9 8.9 8.6* 9.1 9.2  MG  --   --   --   --  2.1   Liver Function Tests:  Recent Labs Lab 10/07/15 1227  AST 33  ALT 15*  ALKPHOS 97  BILITOT 4.8*  PROT 8.0  ALBUMIN 2.8*    Recent Labs Lab 10/07/15 1227 10/09/15 1257 10/10/15 0631  WBC 5.5 5.6 5.6  NEUTROABS 3.6  --   --   HGB 9.8* 9.2* 9.2*  HCT 31.4* 30.2* 30.7*  MCV 82.4 83.0 83.2  PLT 148* 142* 130*   Cardiac Enzymes:  Recent Labs Lab 10/07/15 1227  TROPONINI <0.03   BNP: BNP (last 3 results)  Recent Labs  10/07/15 1227  BNP 2346.0*     SignedOrvan Falconer MD. FACP [facility] 10/13/2015, 11:01 AM

## 2015-10-13 NOTE — Progress Notes (Signed)
BP 93/56 HR 74. Coreg 9.375 mg and lisinopril 2.5 mg due. MD notified/paged. Ordered to hold lisinopril and give Coreg. RN will continue to monitor pt. Oswald Hillock, RN

## 2015-10-13 NOTE — Progress Notes (Addendum)
Labs from AM remain stable. We will start torsemide 60mg  in AM and 40mg  at night, metolazone 2.5mg  prn daily if weight gain more than 3 pounds. We have arranged f/u in 1 week with [place] along with BMET and Mg, and will refer to CHF clinic.    Zandra Abts MD

## 2015-10-14 ENCOUNTER — Telehealth: Payer: Self-pay | Admitting: Cardiology

## 2015-10-14 NOTE — Telephone Encounter (Signed)
Pt says weight today is 170 lbs and that he is "peeing every 30 mins". Would like to decrease torsemide. Per d/c summary pt is taking torsemide 60 in the morning and 40 mg in the evening with metolazone 2.5 mg if weight gain over 3lbs. Pt has not taken metolazone or torsemide today. Pt denies any symptoms. Will forward to Dr. Harl Bowie - pt aware that it may be tomorrow before call back

## 2015-10-14 NOTE — Telephone Encounter (Signed)
Rick [name] has questions about taking Lasix and the dosage.

## 2015-10-15 NOTE — Telephone Encounter (Signed)
Pt aware and will call back Monday with update, this morning was 165lbs

## 2015-10-15 NOTE — Telephone Encounter (Signed)
I would suggest staying on the same doses of fluid pills for the time being. If his weight remains stable we can consider cutting back but over the next few days I would try to stay where we are. Have him call early next week with update.   Zandra Abts MD

## 2015-10-18 NOTE — Telephone Encounter (Signed)
LM on VM.

## 2015-10-18 NOTE — Telephone Encounter (Signed)
Can we call and see how Mr Diles is doing including his swelling and weights   Zandra Abts MD

## 2015-10-19 NOTE — Telephone Encounter (Signed)
Weight is down quite a bit more. Verify how he is taking his torsemide and metolazone, we are likely going to need to cut back some.   Zandra Abts MD

## 2015-10-19 NOTE — Telephone Encounter (Signed)
Pt voiced understanding - updated medication list 

## 2015-10-19 NOTE — Telephone Encounter (Signed)
60 mg in the morning and 40 mg in evening

## 2015-10-19 NOTE — Telephone Encounter (Signed)
Lets have him cut back his torsemide to 40mg  in AM and 40mg  in PM. If weight goes above 158 lbs he should take the 60mg  in Am and 40mg  in pm until back down to 155 or lower  Zandra Abts MD

## 2015-10-19 NOTE — Telephone Encounter (Signed)
Pt returned call 155 lbs weight this morning and says swelling in gone says not going to the bathroom as much. Denies any symptoms, will forward to Dr. Harl Bowie

## 2015-10-19 NOTE — Addendum Note (Signed)
Addended by: Julian Hy T on: 10/19/2015 02:47 PM   Modules accepted: Orders, Medications

## 2015-10-19 NOTE — Telephone Encounter (Signed)
Has not been taking metolazone only taking torsemide

## 2015-10-20 ENCOUNTER — Other Ambulatory Visit (HOSPITAL_COMMUNITY)
Admission: RE | Admit: 2015-10-20 | Discharge: 2015-10-20 | Disposition: A | Discharge status: Home / Self Care | Payer: Medicare Other | Source: Ambulatory Visit | Attending: Physician Assistant | Admitting: Physician Assistant

## 2015-10-20 ENCOUNTER — Ambulatory Visit (INDEPENDENT_AMBULATORY_CARE_PROVIDER_SITE_OTHER): Payer: Medicare Other | Admitting: Physician Assistant

## 2015-10-20 ENCOUNTER — Encounter: Payer: Self-pay | Admitting: Physician Assistant

## 2015-10-20 VITALS — BP 104/58 | HR 81 | Ht 71.0 in | Wt 159.0 lb

## 2015-10-20 DIAGNOSIS — I5021 Acute systolic (congestive) heart failure: Secondary | ICD-10-CM | POA: Diagnosis not present

## 2015-10-20 DIAGNOSIS — I255 Ischemic cardiomyopathy: Secondary | ICD-10-CM | POA: Diagnosis not present

## 2015-10-20 DIAGNOSIS — I10 Essential (primary) hypertension: Secondary | ICD-10-CM

## 2015-10-20 LAB — MAGNESIUM: MAGNESIUM: 2.2 mg/dL (ref 1.7–2.4)

## 2015-10-20 LAB — BASIC METABOLIC PANEL
Anion gap: 8 (ref 5–15)
BUN: 45 mg/dL — AB (ref 6–20)
CALCIUM: 9.3 mg/dL (ref 8.9–10.3)
CHLORIDE: 95 mmol/L — AB (ref 101–111)
CO2: 28 mmol/L (ref 22–32)
CREATININE: 1.45 mg/dL — AB (ref 0.61–1.24)
GFR, EST AFRICAN AMERICAN: 55 mL/min — AB (ref >60)
GFR, EST NON AFRICAN AMERICAN: 48 mL/min — AB (ref >60)
GLUCOSE: 95 mg/dL (ref 65–99)
POTASSIUM: 3.9 mmol/L (ref 3.5–5.1)
SODIUM: 131 mmol/L — AB (ref 135–145)

## 2015-10-20 MED ORDER — TORSEMIDE 20 MG PO TABS: ORAL_TABLET | ORAL | Status: DC

## 2015-10-20 NOTE — Progress Notes (Signed)
Patient ID: Rick [name], male   DOB: 29-Mar-1946, 70 y.o.   MRN: [id number]    Advanced Heart Failure Clinic Note   Referring Physician: Dr Rick [name] Primary Care: Rick Neighbors, MD Primary Cardiologist: Dr Rick [name]  HPI: Rick [name] is a 70 y.o. male with history of ischemic cardiomyopathy status post CABG in 2003 as well as previous stenting. Most recent 2-D echo in 2016 EF 25% with restrictive diastolic dysfunction, moderate RV dysfunction PAS P 64, status post ICD followed by Dr. Rayann [name] with normal check in 02/2015. Saw Dr. Harl [name] in the office 10/07/15 after recent change from Lasix to torsemide  Pt admitted 5/4 - 10/13/15 with recurrent volume overload in setting of dietary non-compliance. Echo during that admission showed EF worsening to 15-20% from 25%.  Seen in Dr. Harl [name] office 10/20/15. Mild volume overload with dietary indiscretion. Torsemide increased to 60/40 mg. Weight at that visit 159 lbs  Pt presents today to establish with the HF clinic.  Weight stable from last visit. Lives at home with wife.  Can go to room to room without SOB. Gets a little winded walking to the mailbox. Thinks he has some swelling in his ankles still. Wife he has cut back on salty food a lot, but still enjoys every now and then. Weight at home ~ 156 lbs. Does have occasional hand cramps. No CP.  Former smoker (19 pack years). Drinks Rick [name], states he has had 3 since he left the hospital.  Wife states prior to recent admission he was drinking 3-4 a day. Does have some facial droop from a previous stroke ( ~2002 he thinks). Wife says he has good days and bad days with breathing and getting around.    Echo 10/08/15 LVEF 15-20%, Severe LV dilation, Severe hypokinesis with areas of akinesis, Trivial AI, Mild MR, Mod LAE, Severe RV dilation, Severe TR, PA peak pressure 36 mm Hg Echo 03/04/15 LVEF 25%,  Severe hypokinesis, Trivial AI, Mild MR, RV mod dilated and mod reduced, Severe RAE, Mod TR, PA peak pressure 64 mm Hg.     Review of Systems: [y] = yes, [ ]  = no   General: Weight gain [ ] ; Weight loss [ ] ; Anorexia [ ] ; Fatigue [y]; Fever [ ] ; Chills [ ] ; Weakness [ ]   Cardiac: Chest pain/pressure [ ] ; Resting SOB [ ] ; Exertional SOB [ ] ; Orthopnea [ ] ; Pedal Edema [ ] ; Palpitations [ ] ; Syncope [ ] ; Presyncope [ ] ; Paroxysmal nocturnal dyspnea[ ]   Pulmonary: Cough [ ] ; Wheezing[ ] ; Hemoptysis[ ] ; Sputum [ ] ; Snoring [ ]   GI: Vomiting[ ] ; Dysphagia[ ] ; Melena[ ] ; Hematochezia [ ] ; Heartburn[ ] ; Abdominal pain [ ] ; Constipation [ ] ; Diarrhea [ ] ; BRBPR [ ]   GU: Hematuria[ ] ; Dysuria [ ] ; Nocturia[ ]   Vascular: Pain in legs with walking [ ] ; Pain in feet with lying flat [ ] ; Non-healing sores [ ] ; Stroke [y]; TIA [ ] ; Slurred speech [ ] ;  Neuro: Headaches[ ] ; Vertigo[ ] ; Seizures[ ] ; Paresthesias[ ] ;Blurred vision [ ] ; Diplopia [ ] ; Vision changes [ ]   Ortho/Skin: Arthritis [y]; Joint pain [y]; Muscle pain [ ] ; Joint swelling [ ] ; Back Pain [ ] ; Rash [ ]   Psych: Depression[ ] ; Anxiety[ ]   Heme: Bleeding problems [ ] ; Clotting disorders [ ] ; Anemia [ ]  Endocrine: Diabetes [ ] ; Thyroid dysfunction[ ]    Past Medical History  Diagnosis Date  . Essential hypertension   . Myocardial infarction acute ([place])   . Ischemic cardiomyopathy  EF 20% by echo 2013  . Other pulmonary embolism and infarction     On coumadin  . Hyperlipidemia   . DOE (dyspnea on exertion)   . CAD (coronary artery disease)   . CVA (cerebral infarction)   . Anxiety   . Tricuspid regurgitation     Severe  . Pulmonary hypertension (HCC)     56 mm Hg  . Cirrhosis ([place])     Current Outpatient Prescriptions  Medication Sig Dispense Refill  . aspirin 81 MG tablet Take 81 mg by mouth daily as needed for pain.    . carvedilol (COREG) 6.25 MG tablet Take 9.375 mg by mouth daily.    . ferrous sulfate 325 (65 FE) MG tablet Take 325 mg by mouth daily with breakfast.    . lisinopril (PRINIVIL,ZESTRIL) 5 MG tablet Take 5 mg by mouth daily.     . metolazone (ZAROXOLYN) 5 MG tablet Take 5 mg by mouth as needed. Patient take 2.5 mg as needed for swelling.    . polyethylene glycol (MIRALAX / GLYCOLAX) packet Take 17 g by mouth daily as needed.    . potassium chloride SA (K-DUR,KLOR-CON) 20 MEQ tablet Take 20 mEq by mouth as needed (hand cramping).    . torsemide (DEMADEX) 20 MG tablet Take 60 mg (3 Tablets)  In The AM And 40 mg (2 Tablets) In the PM 150 tablet 6  . traMADol (ULTRAM) 50 MG tablet Take by mouth every 6 (six) hours as needed for moderate pain.     [name] warfarin (COUMADIN) 3 MG tablet Take 2 tablets daily or as directed 180 tablet 3   No current facility-administered medications for this encounter.    No Known Allergies    Social History   Social History  . Marital Status: Married    Spouse Name: N/A  . Number of Children: 4  . Years of Education: N/A   Occupational History  . Disabled.    Social History Main Topics  . Smoking status: Former Smoker -- 0.50 packs/day for 38 years    Types: Cigarettes    Start date: 12/30/1956    Quit date: 06/06/1995  . Smokeless tobacco: Never Used  . Alcohol Use: No     Comment: Quit in 1997. Drank on the weekends  . Drug Use: No  . Sexual Activity: Not on file   Other Topics Concern  . Not on file   Social History Narrative   Lives with wife and brother in [place] and grandson in [place].        Family History  Problem Relation Age of Onset  . Heart disease Mother     CAD  . Leukemia Father     Rick [name] Vitals:   10/21/15 1357  BP: 98/62  Pulse: 88  Weight: 159 lb 6.4 oz (72.303 kg)  SpO2: 100%    PHYSICAL EXAM: General:  Elderly appearing. No respiratory difficulty HEENT: normal Neck: supple. JVP to jaw. Carotids 2+ bilat; no bruits. No lymphadenopathy or thyromegaly appreciated. Cor: PMI nondisplaced. Regular rate & rhythm. No rubs. 2/6 MR. Soft S3. Lungs: CTAB, normal effort Abdomen: soft, NT, ND, no HSM. No bruits or masses. +BS  Extremities:  no cyanosis, clubbing, rash. 1-2 + edema.  Neuro: alert & oriented x 3, cranial nerves grossly intact. moves all 4 extremities w/o difficulty. Affect pleasant.   ASSESSMENT & PLAN:  1. Acute on chronic combined CHF - Echo 10/08/15 LVEF 15-20% with ICM 2. CAD s/p CABG 3. HTN  4. ETOH abuse 5. Hx of CVA  He remains markedly volume overloaded on exam. He has biventricular failure and is very high risk for decompensation/readmission. He will need R/LHC once more adequately diuresed.    Decrease coreg to 3.125 mg BID.  Add Bidil to 0.5 mg TID.   Increase torsemide to 60 mg BID.   Encouraged to stop drinking completely.   Follow up on PA/NP side in 2 weeks.   Legrand Como "Jonni Sanger" Elwood, PA-C 10/21/2015    Patient seen and examined with Oda Kilts, PA-C. We discussed all aspects of the encounter. I agree with the assessment and plan as stated above.   Echo reviewed personally. He has severe biventricular HF. With NYHA III symptoms and volume overload on exam. Suspect he may have component of ETOH CM. Will increase torsemide as above. Cut carvedilol and add Bidil as tolerated. Long talk about need for cessation of ETOH. If getting worse consider R/L cath. He is not candidate for LVAD or transplant. Could consider home inotropes if needed.   Pattijo Juste,MD 3:10 PM

## 2015-10-20 NOTE — Progress Notes (Signed)
Cardiology Office Note    Date:  10/20/2015   ID:  Rick [name], DOB Oct 10, 1945, MRN [id number]  PCP:  Rick Neighbors, MD  Cardiologist: Dr. Harl Bowie  Chief complaint swelling  History of Present Illness:  Rick [name] is a 70 y.o. male with history of ischemic cardiomyopathy status post CABG in 2003 as well as previous stenting. Most recent 2-D echo in 2016 EF 25% with restrictive diastolic dysfunction, moderate RV dysfunction PAS P 64, status post ICD followed by Dr. Rayann Heman with normal check in 02/2015. Saw Dr. Harl Bowie in the office 10/07/15 after recent change from Lasix to torsemide.  Patient had 8 pound weight gain and fluid overload with severe anasarca. He continued to eat a high sodium diet. He was admitted to the hospital for IV diuresis. Repeat 2-D echo showed EF to be 15-20%. He was discharged home on Demadex 60 in the morning and 40 in the evening but since has been doing increased to 40 mg twice a day. He also takes metolazone 2.5 mgDaily. Lisinopril and spironolactone were stopped because of low blood pressures. Weight went from 181 pounds to 172 pounds in the hospital. He is now down to 159 pounds.  Patient complains of mild edema starting back. He does admit to getting salt in his diet. Breathing has been okay. He does have some dyspnea on exertion.    Past Medical History  Diagnosis Date  . Essential hypertension   . Myocardial infarction acute ([place])   . Ischemic cardiomyopathy     EF 20% by echo 2013  . Other pulmonary embolism and infarction     On coumadin  . Hyperlipidemia   . DOE (dyspnea on exertion)   . CAD (coronary artery disease)   . CVA (cerebral infarction)   . Anxiety   . Tricuspid regurgitation     Severe  . Pulmonary hypertension (HCC)     56 mm Hg  . Cirrhosis [facility])     Past Surgical History  Procedure Laterality Date  . Coronary artery bypass graft  2003    4 vessel  . Cardiac defibrillator placement  11/09/06    SJM Epic II DR ICD implanted in  [place], atrial lead no longer functions due to low impedance  . Implantable cardioverter defibrillator generator change  05/27/2012    Gen change.  SJM Assura VR.  Riata V lead  . Colonoscopy with esophagogastroduodenoscopy (egd)  06/06/2012    Procedure: COLONOSCOPY WITH ESOPHAGOGASTRODUODENOSCOPY (EGD);  Surgeon: Rogene Houston, MD;  Location: [facility];  Service: Endoscopy;  Laterality: N/A;  310  . Implantable cardioverter defibrillator (icd) generator change N/A 05/27/2012    Procedure: ICD GENERATOR CHANGE;  Surgeon: Thompson Grayer, MD;  Location: [facility];  Service: Cardiovascular;  Laterality: N/A;    Current Medications: Outpatient Prescriptions Prior to Visit  Medication Sig Dispense Refill  . aspirin EC 81 MG EC tablet Take 1 tablet (81 mg total) by mouth daily. 100 tablet 1  . carvedilol (COREG) 6.25 MG tablet Take 9.375 mg by mouth daily.    [name] KLOR-CON M20 20 MEQ tablet TAKE ONE TABLET BY MOUTH ONCE DAILY (Patient taking differently: TAKE ONE TABLET BY MOUTH ONCE DAILY as needed) 30 tablet 6  . polyethylene glycol (MIRALAX / GLYCOLAX) packet Take 17 g by mouth daily as needed.    . traMADol (ULTRAM) 50 MG tablet Take by mouth every 6 (six) hours as needed.    . warfarin (COUMADIN) 3 MG tablet Take  2 tablets daily or as directed 180 tablet 3  . metolazone (ZAROXOLYN) 5 MG tablet Take 0.5 tablets (2.5 mg total) by mouth daily. (Patient taking differently: Take 2.5 mg by mouth as needed. ) 30 tablet 1  . torsemide (DEMADEX) 20 MG tablet Take 40 mg by mouth 2 (two) times daily. Take 2 tab 2 times daily wt over 158lbs take 60 AM and 40 PM     No facility-administered medications prior to visit.     Allergies:   Review of patient's allergies indicates no known allergies.   Social History   Social History  . Marital Status: Married    Spouse Name: N/A  . Number of Children: 4  . Years of Education: N/A   Occupational History  . Disabled.    Social History Main  Topics  . Smoking status: Former Smoker -- 0.50 packs/day for 38 years    Types: Cigarettes    Start date: 12/30/1956    Quit date: 06/06/1995  . Smokeless tobacco: Never Used  . Alcohol Use: No     Comment: Quit in 1997. Drank on the weekends  . Drug Use: No  . Sexual Activity: Not Asked   Other Topics Concern  . None   Social History Narrative   Lives with wife and brother in [place] and grandson in [place].       Family History:  The patient's    family history includes Heart disease in his mother; Leukemia in his father.   ROS:   Please see the history of present illness.    Review of Systems  Constitution: Negative.  HENT: Negative.   Cardiovascular: Positive for dyspnea on exertion and leg swelling.  Respiratory: Positive for cough and shortness of breath.   Endocrine: Negative.   Hematologic/Lymphatic: Negative.   Musculoskeletal: Negative.   Gastrointestinal: Negative.   Genitourinary: Negative.   Neurological: Negative.    All other systems reviewed and are negative.   PHYSICAL EXAM:   VS:  BP 104/58 mmHg  Pulse 81  Ht 5\' 11"  (1.803 m)  Wt 159 lb (72.122 kg)  BMI 22.19 kg/m2  SpO2 97%   GEN: Well nourished, well developed, in no acute distress Neck: Increased JVD, carotid bruits, or masses Cardiac: RRR; positive S3 and S4 2/6 systolic murmur at the left sternal border, 1+ ankle edema bilaterally right greater than left  Respiratory: Decreased breath sounds but clear to auscultation bilaterally, normal work of breathing GI: soft, nontender, nondistended, + BS MS: no deformity or atrophy Skin: warm and dry, no rash Neuro:  Alert and Oriented x 3, Strength and sensation are intact Psych: euthymic mood, full affect  Wt Readings from Last 3 Encounters:  10/20/15 159 lb (72.122 kg)  10/13/15 172 lb 1.6 oz (78.064 kg)  10/07/15 187 lb 6.4 oz (85.004 kg)      Studies/Labs Reviewed:   EKG:  EKG is Not ordered today.    Recent Labs: 10/07/2015: ALT  15*; B Natriuretic Peptide 2346.0* 10/10/2015: Hemoglobin 9.2*; Platelets 130* 10/13/2015: BUN 44*; Creatinine, Ser 1.38*; Magnesium 2.1; Potassium 4.1; Sodium 131*   Lipid Panel No results found for: CHOL, TRIG, HDL, CHOLHDL, VLDL, LDLCALC, LDLDIRECT  Additional studies/ records that were reviewed today include:  2-D echo 10/08/15 Study Conclusions   - Left ventricle: Systolic function is severely reduced, EF 15-20%.   The cavity size was severely dilated. Wall thickness was normal.   Doppler parameters are consistent with restrictive physiology,   indicative of decreased left  ventricular diastolic compliance   and/or increased left atrial pressure. Doppler parameters are   consistent with high ventricular filling pressure. - Regional wall motion abnormality: Akinesis of the basal-mid   anteroseptal, basal-mid inferior, apical septal, apical lateral,   and apical myocardium; severe hypokinesis of the apical anterior,   mid inferoseptal, and apical inferior myocardium; moderate   hypokinesis of the mid anterior myocardium. - Ventricular septum: Septal motion showed abnormal function and   dyssynergy. The contour showed systolic flattening consistent   with pressure overload. These changes are consistent with   intraventricular conduction delay. - Aortic valve: Moderately calcified annulus. Trileaflet. There was   trivial regurgitation. - Mitral valve: There was mild regurgitation. - Left atrium: The atrium was moderately dilated. - Right ventricle: The cavity size was severely dilated. Pacer wire   or catheter noted in right ventricle. Systolic function was   severely reduced. - Right atrium: The atrium was severely dilated. Pacer wire or   catheter noted in right atrium. Central venous pressure (est): 15   mm Hg. - Tricuspid valve: There was severe regurgitation. - Pulmonary arteries: PA peak pressure: 36 mm Hg (S). I suspect   this is underestimated given the degree of tricuspid    regurgitation. - Inferior vena cava: The vessel was dilated. The respirophasic   diameter changes were blunted (< 50%), consistent with elevated   central venous pressure.    02/2015 echo Study Conclusions  - Left ventricle: The cavity size was severely dilated. Wall   thickness was normal. Systolic function was severely reduced. The   estimated ejection fraction was 25%. There is akinesis of the   apicalanteroseptal and anterolateral myocardium. There is   akinesis of the basalinferior myocardium. Doppler parameters are   consistent with restrictive physiology, indicative of decreased   left ventricular diastolic compliance and/or increased left   atrial pressure. - Aortic valve: Moderately calcified annulus. Trileaflet; mildly   calcified leaflets. There was trivial regurgitation. - Mitral valve: Mildly thickened leaflets . There was mild   regurgitation. - Left atrium: The atrium was moderately dilated. - Right ventricle: The cavity size was moderately dilated. Pacer   wire or catheter noted in right ventricle. Systolic function was   mildly to moderately reduced. - Right atrium: The atrium was severely dilated. - Tricuspid valve: There was moderate regurgitation. - Pulmonary arteries: Systolic pressure was severely increased. PA   peak pressure: 64 mm Hg (S). - Pericardium, extracardiac: There was no pericardial effusion.  Impressions:  - Severe LV chamber dilatation with LVEF approximately 25%, wall   motion abnormalities consistent with ischemic cardiomyopathy.   Restrictive diastolic filling pattern. Moderate left atrial   enlargement. Mild mitral regurgitation. Sclerotic aortic valve   with trivial aortic regurgitation. Moderate RV dysfunction.   Device wire present in right heart. Moderate tricuspid   regurgitation with severe pulmonary hypertension, PASP 64 mmHg.   Severe right atrial enlargement.      ASSESSMENT:    1. Essential hypertension   2. Acute  systolic CHF (congestive heart failure) ([place])   3. Ischemic cardiomyopathy      PLAN:  In order of problems listed above:  Essential hypertension patient's blood pressures been on the low side so we'll not resume lisinopril or spironolactone at this point.  Acute systolic heart failure patient is getting excess of salt in his diet. Once again went over her low-sodium diet. Discussed this patient with Dr. Harl Bowie who recommends Zaroxolyn 2.5 mg when necessary for weight of over  160 pounds. Increase Demadex to 60 mg in the morning 40 in the evening. Check BMP today. Follow-up with Dr. branch in 2 weeks  Ischemic cardiomyopathy EF worsened on recent echo in the hospital to 15-20%. Will need close outpatient follow-up to keep out of the hospital.      Medication Adjustments/Labs and Tests Ordered: Current medicines are reviewed at length with the patient today.  Concerns regarding medicines are outlined above.  Medication changes, Labs and Tests ordered today are listed in the Patient Instructions below. There are no Patient Instructions on file for this visit.   Signed, Ermalinda Barrios, PA-C  10/20/2015 2:11 PM    [facility] [address], [place], [place]  [address] Phone: [phone]; Fax: [phone]

## 2015-10-20 NOTE — Patient Instructions (Signed)
Your physician recommends that you schedule a follow-up appointment in: 2 Weeks   Your physician has recommended you make the following change in your medication:   Increase Torsemide to 60 mg in the AM and 40 mg in the PM Daily   Your physician recommends that you have lab work done today.  If you need a refill on your cardiac medications before your next appointment, please call your pharmacy.  Thank you for choosing [facility]!

## 2015-10-21 ENCOUNTER — Ambulatory Visit (INDEPENDENT_AMBULATORY_CARE_PROVIDER_SITE_OTHER): Payer: Medicare Other | Admitting: *Deleted

## 2015-10-21 ENCOUNTER — Encounter (HOSPITAL_COMMUNITY): Payer: Self-pay

## 2015-10-21 ENCOUNTER — Ambulatory Visit (HOSPITAL_COMMUNITY)
Admit: 2015-10-21 | Discharge: 2015-10-21 | Disposition: A | Discharge status: Home / Self Care | Payer: Medicare Other | Source: Ambulatory Visit | Attending: Internal Medicine | Admitting: Internal Medicine

## 2015-10-21 VITALS — BP 98/62 | HR 88 | Wt 159.4 lb

## 2015-10-21 DIAGNOSIS — Z7901 Long term (current) use of anticoagulants: Secondary | ICD-10-CM | POA: Insufficient documentation

## 2015-10-21 DIAGNOSIS — I5043 Acute on chronic combined systolic (congestive) and diastolic (congestive) heart failure: Secondary | ICD-10-CM | POA: Diagnosis not present

## 2015-10-21 DIAGNOSIS — Z5181 Encounter for therapeutic drug level monitoring: Secondary | ICD-10-CM | POA: Diagnosis not present

## 2015-10-21 DIAGNOSIS — K7460 Unspecified cirrhosis of liver: Secondary | ICD-10-CM | POA: Insufficient documentation

## 2015-10-21 DIAGNOSIS — I25810 Atherosclerosis of coronary artery bypass graft(s) without angina pectoris: Secondary | ICD-10-CM | POA: Insufficient documentation

## 2015-10-21 DIAGNOSIS — I252 Old myocardial infarction: Secondary | ICD-10-CM | POA: Diagnosis not present

## 2015-10-21 DIAGNOSIS — I272 Other secondary pulmonary hypertension: Secondary | ICD-10-CM | POA: Diagnosis not present

## 2015-10-21 DIAGNOSIS — I255 Ischemic cardiomyopathy: Secondary | ICD-10-CM | POA: Insufficient documentation

## 2015-10-21 DIAGNOSIS — F1010 Alcohol abuse, uncomplicated: Secondary | ICD-10-CM | POA: Insufficient documentation

## 2015-10-21 DIAGNOSIS — Z79899 Other long term (current) drug therapy: Secondary | ICD-10-CM | POA: Insufficient documentation

## 2015-10-21 DIAGNOSIS — Z8673 Personal history of transient ischemic attack (TIA), and cerebral infarction without residual deficits: Secondary | ICD-10-CM | POA: Diagnosis not present

## 2015-10-21 DIAGNOSIS — Z87891 Personal history of nicotine dependence: Secondary | ICD-10-CM | POA: Diagnosis not present

## 2015-10-21 DIAGNOSIS — E785 Hyperlipidemia, unspecified: Secondary | ICD-10-CM | POA: Insufficient documentation

## 2015-10-21 DIAGNOSIS — Z951 Presence of aortocoronary bypass graft: Secondary | ICD-10-CM | POA: Insufficient documentation

## 2015-10-21 DIAGNOSIS — I4891 Unspecified atrial fibrillation: Secondary | ICD-10-CM | POA: Diagnosis not present

## 2015-10-21 DIAGNOSIS — I110 Hypertensive heart disease with heart failure: Secondary | ICD-10-CM | POA: Diagnosis not present

## 2015-10-21 DIAGNOSIS — Z8249 Family history of ischemic heart disease and other diseases of the circulatory system: Secondary | ICD-10-CM | POA: Insufficient documentation

## 2015-10-21 DIAGNOSIS — I10 Essential (primary) hypertension: Secondary | ICD-10-CM | POA: Diagnosis not present

## 2015-10-21 DIAGNOSIS — Z86711 Personal history of pulmonary embolism: Secondary | ICD-10-CM | POA: Diagnosis not present

## 2015-10-21 DIAGNOSIS — I2510 Atherosclerotic heart disease of native coronary artery without angina pectoris: Secondary | ICD-10-CM | POA: Diagnosis not present

## 2015-10-21 DIAGNOSIS — Z7982 Long term (current) use of aspirin: Secondary | ICD-10-CM | POA: Insufficient documentation

## 2015-10-21 LAB — POCT INR: INR: 1.4

## 2015-10-21 MED ORDER — TORSEMIDE 20 MG PO TABS: 60.0000 mg | ORAL_TABLET | Freq: Two times a day (BID) | ORAL | Status: DC

## 2015-10-21 MED ORDER — CARVEDILOL 6.25 MG PO TABS: 3.1250 mg | ORAL_TABLET | Freq: Two times a day (BID) | ORAL | Status: DC

## 2015-10-21 MED ORDER — ISOSORB DINITRATE-HYDRALAZINE 20-37.5 MG PO TABS: 0.5000 | ORAL_TABLET | Freq: Three times a day (TID) | ORAL | Status: DC

## 2015-10-21 NOTE — Patient Instructions (Addendum)
DECREASE Coreg to 3.125 mg , one half tab twice a day INCREASE TORSEMIDE TO 60 MG, (3 TABS) TWICE A DAY START BIDIL, ONE HALF TAB THREE TIMES A DAY  Your physician recommends that you schedule a follow-up appointment in: 2 WEEKS In the [place] following things EVERYDAY: 1) Weigh yourself in the morning before breakfast. Write it down and keep it in a log. 2) Take your medicines as prescribed 3) Eat low salt foods-Limit salt (sodium) to 2000 mg per day.  4) Stay as active as you can everyday Limit all fluids for the day to less than 2 liters \

## 2015-10-21 NOTE — Progress Notes (Signed)
Advanced Heart Failure Medication Review by a Pharmacist  Does the patient  feel that his/her medications are working for him/her?  yes  Has the patient been experiencing any side effects to the medications prescribed?  no  Does the patient measure his/her own blood pressure or blood glucose at home?  no   Does the patient have any problems obtaining medications due to transportation or finances?   no  Understanding of regimen: poor Understanding of indications: poor Potential of compliance: poor Patient understands to avoid NSAIDs. Patient understands to avoid decongestants.  Issues to address at subsequent visits: Compliance   Pharmacist comments:  Rick [name] is a pleasant 70 yo M presenting with his medication bottles. He reports only missing one dose of his medications in the last week but does admit to only taking KCl supplementation if he is having hand cramping. He also thinks he has been taking lisinopril even though he was told to hold it at last hospital discharge. Unsure if discontinued spironolactone as advised to do at discharge. Discussed the importance of compliance and he verbalized understanding.   [name]. Velva Harman, PharmD, BCPS, CPP Clinical Pharmacist Pager: [phone] Phone: [phone] 10/21/2015 2:05 PM       Time with patient: 10 minutes Preparation and documentation time: 2 minutes Total time: 12 minutes

## 2015-10-26 ENCOUNTER — Other Ambulatory Visit: Payer: Self-pay

## 2015-10-26 ENCOUNTER — Encounter: Payer: Self-pay | Admitting: Cardiology

## 2015-10-26 DIAGNOSIS — Z79899 Other long term (current) drug therapy: Secondary | ICD-10-CM

## 2015-11-03 ENCOUNTER — Inpatient Hospital Stay (HOSPITAL_COMMUNITY): Payer: Medicare Other

## 2015-11-03 ENCOUNTER — Encounter (HOSPITAL_COMMUNITY): Payer: Self-pay | Admitting: General Practice

## 2015-11-03 ENCOUNTER — Ambulatory Visit (HOSPITAL_BASED_OUTPATIENT_CLINIC_OR_DEPARTMENT_OTHER)
Admission: RE | Admit: 2015-11-03 | Discharge: 2015-11-03 | Disposition: A | Discharge status: Home / Self Care | Payer: Medicare Other | Source: Ambulatory Visit | Attending: Internal Medicine | Admitting: Internal Medicine

## 2015-11-03 ENCOUNTER — Inpatient Hospital Stay (HOSPITAL_COMMUNITY)
Admission: AD | Admit: 2015-11-03 | Discharge: 2015-11-12 | DRG: 286 | Disposition: A | Discharge status: Home Health | Payer: Medicare Other | Source: Ambulatory Visit | Attending: Internal Medicine | Admitting: Internal Medicine

## 2015-11-03 VITALS — BP 82/56 | HR 81 | Wt 179.0 lb

## 2015-11-03 DIAGNOSIS — E785 Hyperlipidemia, unspecified: Secondary | ICD-10-CM

## 2015-11-03 DIAGNOSIS — I2510 Atherosclerotic heart disease of native coronary artery without angina pectoris: Secondary | ICD-10-CM | POA: Diagnosis not present

## 2015-11-03 DIAGNOSIS — Z87891 Personal history of nicotine dependence: Secondary | ICD-10-CM

## 2015-11-03 DIAGNOSIS — I509 Heart failure, unspecified: Secondary | ICD-10-CM | POA: Diagnosis not present

## 2015-11-03 DIAGNOSIS — Z95810 Presence of automatic (implantable) cardiac defibrillator: Secondary | ICD-10-CM

## 2015-11-03 DIAGNOSIS — Z951 Presence of aortocoronary bypass graft: Secondary | ICD-10-CM

## 2015-11-03 DIAGNOSIS — Z955 Presence of coronary angioplasty implant and graft: Secondary | ICD-10-CM | POA: Insufficient documentation

## 2015-11-03 DIAGNOSIS — R0609 Other forms of dyspnea: Secondary | ICD-10-CM

## 2015-11-03 DIAGNOSIS — I110 Hypertensive heart disease with heart failure: Secondary | ICD-10-CM

## 2015-11-03 DIAGNOSIS — I272 Other secondary pulmonary hypertension: Secondary | ICD-10-CM | POA: Insufficient documentation

## 2015-11-03 DIAGNOSIS — Z7982 Long term (current) use of aspirin: Secondary | ICD-10-CM

## 2015-11-03 DIAGNOSIS — I252 Old myocardial infarction: Secondary | ICD-10-CM

## 2015-11-03 DIAGNOSIS — Z7901 Long term (current) use of anticoagulants: Secondary | ICD-10-CM

## 2015-11-03 DIAGNOSIS — I255 Ischemic cardiomyopathy: Secondary | ICD-10-CM | POA: Diagnosis present

## 2015-11-03 DIAGNOSIS — R601 Generalized edema: Secondary | ICD-10-CM

## 2015-11-03 DIAGNOSIS — R570 Cardiogenic shock: Secondary | ICD-10-CM | POA: Diagnosis not present

## 2015-11-03 DIAGNOSIS — K7460 Unspecified cirrhosis of liver: Secondary | ICD-10-CM | POA: Insufficient documentation

## 2015-11-03 DIAGNOSIS — I5022 Chronic systolic (congestive) heart failure: Secondary | ICD-10-CM

## 2015-11-03 DIAGNOSIS — Z23 Encounter for immunization: Secondary | ICD-10-CM | POA: Diagnosis not present

## 2015-11-03 DIAGNOSIS — F1010 Alcohol abuse, uncomplicated: Secondary | ICD-10-CM | POA: Insufficient documentation

## 2015-11-03 DIAGNOSIS — Z79899 Other long term (current) drug therapy: Secondary | ICD-10-CM

## 2015-11-03 DIAGNOSIS — I5043 Acute on chronic combined systolic (congestive) and diastolic (congestive) heart failure: Secondary | ICD-10-CM

## 2015-11-03 DIAGNOSIS — I071 Rheumatic tricuspid insufficiency: Secondary | ICD-10-CM | POA: Diagnosis not present

## 2015-11-03 DIAGNOSIS — E876 Hypokalemia: Secondary | ICD-10-CM | POA: Diagnosis present

## 2015-11-03 DIAGNOSIS — Z8673 Personal history of transient ischemic attack (TIA), and cerebral infarction without residual deficits: Secondary | ICD-10-CM

## 2015-11-03 DIAGNOSIS — Z452 Encounter for adjustment and management of vascular access device: Secondary | ICD-10-CM | POA: Diagnosis not present

## 2015-11-03 DIAGNOSIS — Z86711 Personal history of pulmonary embolism: Secondary | ICD-10-CM

## 2015-11-03 DIAGNOSIS — E8770 Fluid overload, unspecified: Secondary | ICD-10-CM | POA: Diagnosis present

## 2015-11-03 DIAGNOSIS — Z8249 Family history of ischemic heart disease and other diseases of the circulatory system: Secondary | ICD-10-CM

## 2015-11-03 DIAGNOSIS — Z9111 Patient's noncompliance with dietary regimen: Secondary | ICD-10-CM | POA: Diagnosis not present

## 2015-11-03 DIAGNOSIS — N179 Acute kidney failure, unspecified: Secondary | ICD-10-CM | POA: Diagnosis not present

## 2015-11-03 DIAGNOSIS — I10 Essential (primary) hypertension: Secondary | ICD-10-CM

## 2015-11-03 DIAGNOSIS — N189 Chronic kidney disease, unspecified: Secondary | ICD-10-CM | POA: Diagnosis not present

## 2015-11-03 DIAGNOSIS — I4510 Unspecified right bundle-branch block: Secondary | ICD-10-CM | POA: Diagnosis present

## 2015-11-03 DIAGNOSIS — D509 Iron deficiency anemia, unspecified: Secondary | ICD-10-CM | POA: Diagnosis present

## 2015-11-03 HISTORY — DX: Unspecified cirrhosis of liver: K74.60

## 2015-11-03 HISTORY — DX: Presence of automatic (implantable) cardiac defibrillator: Z95.810

## 2015-11-03 HISTORY — DX: Sleep apnea, unspecified: G47.30

## 2015-11-03 HISTORY — DX: Family history of other specified conditions: Z84.89

## 2015-11-03 HISTORY — DX: Heart failure, unspecified: I50.9

## 2015-11-03 HISTORY — DX: Cerebral infarction, unspecified: I63.9

## 2015-11-03 LAB — BASIC METABOLIC PANEL
ANION GAP: 9 (ref 5–15)
BUN: 49 mg/dL — ABNORMAL HIGH (ref 6–20)
CALCIUM: 9.2 mg/dL (ref 8.9–10.3)
CO2: 23 mmol/L (ref 22–32)
Chloride: 103 mmol/L (ref 101–111)
Creatinine, Ser: 1.81 mg/dL — ABNORMAL HIGH (ref 0.61–1.24)
GFR, EST AFRICAN AMERICAN: 42 mL/min — AB (ref >60)
GFR, EST NON AFRICAN AMERICAN: 36 mL/min — AB (ref >60)
Glucose, Bld: 102 mg/dL — ABNORMAL HIGH (ref 65–99)
POTASSIUM: 3.8 mmol/L (ref 3.5–5.1)
SODIUM: 135 mmol/L (ref 135–145)

## 2015-11-03 LAB — CBC
HCT: 26.2 % — ABNORMAL LOW (ref 39.0–52.0)
Hemoglobin: 7.7 g/dL — ABNORMAL LOW (ref 13.0–17.0)
MCH: 25.4 pg — AB (ref 26.0–34.0)
MCHC: 29.4 g/dL — ABNORMAL LOW (ref 30.0–36.0)
MCV: 86.5 fL (ref 78.0–100.0)
PLATELETS: 143 10*3/uL — AB (ref 150–400)
RBC: 3.03 MIL/uL — AB (ref 4.22–5.81)
RDW: 23 % — ABNORMAL HIGH (ref 11.5–15.5)
WBC: 5.8 10*3/uL (ref 4.0–10.5)

## 2015-11-03 LAB — PROTIME-INR
INR: 1.81 — ABNORMAL HIGH (ref 0.00–1.49)
Prothrombin Time: 20.9 seconds — ABNORMAL HIGH (ref 11.6–15.2)

## 2015-11-03 LAB — CARBOXYHEMOGLOBIN
Carboxyhemoglobin: 1.5 % (ref 0.5–1.5)
METHEMOGLOBIN: 1 % (ref 0.0–1.5)
O2 Saturation: 41.4 %
TOTAL HEMOGLOBIN: 8.1 g/dL — AB (ref 13.5–18.0)

## 2015-11-03 LAB — BRAIN NATRIURETIC PEPTIDE: B NATRIURETIC PEPTIDE 5: 2344.5 pg/mL — AB (ref 0.0–100.0)

## 2015-11-03 LAB — MRSA PCR SCREENING: MRSA BY PCR: NEGATIVE

## 2015-11-03 LAB — MAGNESIUM: Magnesium: 2.1 mg/dL (ref 1.7–2.4)

## 2015-11-03 MED ORDER — FERROUS SULFATE 325 (65 FE) MG PO TABS
325.0000 mg | ORAL_TABLET | Freq: Every day | ORAL | Status: DC
  Administered 2015-11-04 – 2015-11-12 (×9): 325 mg via ORAL
  Filled 2015-11-03 (×10): qty 1

## 2015-11-03 MED ORDER — ACETAMINOPHEN 325 MG PO TABS
650.0000 mg | ORAL_TABLET | ORAL | Status: DC | PRN
  Administered 2015-11-05 – 2015-11-12 (×16): 650 mg via ORAL
  Filled 2015-11-03 (×18): qty 2

## 2015-11-03 MED ORDER — WARFARIN SODIUM 4 MG PO TABS
4.0000 mg | ORAL_TABLET | Freq: Once | ORAL | Status: AC
  Administered 2015-11-03: 4 mg via ORAL
  Filled 2015-11-03: qty 1

## 2015-11-03 MED ORDER — SODIUM CHLORIDE 0.9% FLUSH
10.0000 mL | Freq: Two times a day (BID) | INTRAVENOUS | Status: DC
  Administered 2015-11-03 – 2015-11-05 (×2): 30 mL
  Administered 2015-11-05 (×2): 10 mL
  Administered 2015-11-06 – 2015-11-07 (×2): 30 mL
  Administered 2015-11-08: 3 mL
  Administered 2015-11-09 – 2015-11-11 (×5): 10 mL

## 2015-11-03 MED ORDER — TRAMADOL HCL 50 MG PO TABS
50.0000 mg | ORAL_TABLET | Freq: Four times a day (QID) | ORAL | Status: DC | PRN
  Administered 2015-11-04 – 2015-11-09 (×7): 50 mg via ORAL
  Filled 2015-11-03 (×7): qty 1

## 2015-11-03 MED ORDER — ONDANSETRON HCL 4 MG/2ML IJ SOLN: 4.0000 mg | Freq: Four times a day (QID) | INTRAMUSCULAR | Status: DC | PRN

## 2015-11-03 MED ORDER — PNEUMOCOCCAL VAC POLYVALENT 25 MCG/0.5ML IJ INJ
0.5000 mL | INJECTION | INTRAMUSCULAR | Status: AC
  Administered 2015-11-04: 0.5 mL via INTRAMUSCULAR
  Filled 2015-11-03: qty 0.5

## 2015-11-03 MED ORDER — SODIUM CHLORIDE 0.9 % IV SOLN: 250.0000 mL | INTRAVENOUS | Status: DC | PRN

## 2015-11-03 MED ORDER — POTASSIUM CHLORIDE CRYS ER 20 MEQ PO TBCR: 20.0000 meq | EXTENDED_RELEASE_TABLET | Freq: Two times a day (BID) | ORAL | Status: DC

## 2015-11-03 MED ORDER — SODIUM CHLORIDE 0.9% FLUSH: 10.0000 mL | INTRAVENOUS | Status: DC | PRN

## 2015-11-03 MED ORDER — MILRINONE LACTATE IN DEXTROSE 20-5 MG/100ML-% IV SOLN
0.1250 ug/kg/min | INTRAVENOUS | Status: DC
  Administered 2015-11-03 – 2015-11-07 (×5): 0.25 ug/kg/min via INTRAVENOUS
  Administered 2015-11-08 – 2015-11-09 (×2): 0.125 ug/kg/min via INTRAVENOUS
  Filled 2015-11-03 (×9): qty 100

## 2015-11-03 MED ORDER — SODIUM CHLORIDE 0.9% FLUSH
3.0000 mL | Freq: Two times a day (BID) | INTRAVENOUS | Status: DC
  Administered 2015-11-03 – 2015-11-11 (×8): 3 mL via INTRAVENOUS

## 2015-11-03 MED ORDER — FUROSEMIDE 10 MG/ML IJ SOLN
80.0000 mg | Freq: Once | INTRAMUSCULAR | Status: AC
  Administered 2015-11-03: 80 mg via INTRAVENOUS
  Filled 2015-11-03: qty 8

## 2015-11-03 MED ORDER — POLYETHYLENE GLYCOL 3350 17 G PO PACK
17.0000 g | PACK | Freq: Every day | ORAL | Status: DC | PRN
  Administered 2015-11-06 – 2015-11-11 (×3): 17 g via ORAL
  Filled 2015-11-03 (×3): qty 1

## 2015-11-03 MED ORDER — POTASSIUM CHLORIDE CRYS ER 20 MEQ PO TBCR
20.0000 meq | EXTENDED_RELEASE_TABLET | Freq: Two times a day (BID) | ORAL | Status: DC
  Administered 2015-11-03: 20 meq via ORAL
  Filled 2015-11-03: qty 1

## 2015-11-03 MED ORDER — WARFARIN - PHARMACIST DOSING INPATIENT: Freq: Every day | Status: DC

## 2015-11-03 MED ORDER — SODIUM CHLORIDE 0.9% FLUSH: 3.0000 mL | INTRAVENOUS | Status: DC | PRN

## 2015-11-03 MED ORDER — FUROSEMIDE 10 MG/ML IJ SOLN
80.0000 mg | Freq: Two times a day (BID) | INTRAMUSCULAR | Status: DC
Start: 2015-11-03 — End: 2015-11-04
  Administered 2015-11-03: 80 mg via INTRAVENOUS
  Filled 2015-11-03: qty 8

## 2015-11-03 MED ORDER — ISOSORB DINITRATE-HYDRALAZINE 20-37.5 MG PO TABS
0.5000 | ORAL_TABLET | Freq: Three times a day (TID) | ORAL | Status: DC
  Administered 2015-11-04 – 2015-11-08 (×16): 0.5 via ORAL
  Filled 2015-11-03 (×17): qty 1

## 2015-11-03 NOTE — H&P (Signed)
Patient ID: Rick [name], male DOB: July 13, 1945, 70 y.o. MRN: [id number]     Advanced Heart Failure Clinic Note   Referring Physician: Dr Harl Bowie Primary Care: Wende Neighbors, MD Primary Cardiologist: Dr Harl Bowie  HPI: Rick [name] is a 70 y.o. male with history of ischemic cardiomyopathy status post CABG in 2003 as well as previous stenting. Most recent 2-D echo in 2016 EF 25% with restrictive diastolic dysfunction, moderate RV dysfunction PAS P 64, status post ICD followed by Dr. Rayann Heman with normal check in 02/2015. Saw Dr. Harl Bowie in the office 10/07/15 after recent change from Lasix to torsemide  Pt admitted 5/4 - 10/13/15 with recurrent volume overload in setting of dietary non-compliance. Echo during that admission showed EF worsening to 15-20% from 25%.  Seen in Dr. Harl Bowie office 10/20/15. Mild volume overload with dietary indiscretion. Torsemide increased to 60/40 mg. Weight at that visit 159 lbs  He presents today for regular follow up. At last visit was volume overloaded. Cut back coreg with some concern for low output, and torsemide increased. Weight is up 20 lbs from last visit (2 weeks ago). Has not taken metolazone. Does not weigh daily, though weight has came up over past two weeks. Most recently 172 at home (was 156 at home at last visit). He is SOB walking a short distance, but not with getting dressed or showering. Can't make it to the mailbox without feeling SOB any more. Has had several beers this week, states he is trying to cut back. He is not watching his salt. Has been drinking a lot of water because of the heat. Has had some chest pressure as his weight has come up, not related to exertion. Much more fatigued.   Echo 10/08/15 LVEF 15-20%, Severe LV dilation, Severe hypokinesis with areas of akinesis, Trivial AI, Mild MR, Mod LAE, Severe RV dilation, Severe TR, PA peak pressure 36 mm Hg Echo 03/04/15 LVEF 25%, Severe hypokinesis, Trivial AI, Mild MR, RV mod dilated and mod  reduced, Severe RAE, Mod TR, PA peak pressure 64 mm Hg.  Review of Systems: [y] = yes, [ ]  = no   General: Weight gain [y]; Weight loss [ ] ; Anorexia [ ] ; Fatigue [Y]; Fever [ ] ; Chills [ ] ; Weakness [Y ]  Cardiac: Chest pain/pressure [ ] ; Resting SOB [ ] ; Exertional SOB [Y]; Orthopnea [y]; Pedal Edema [y; Palpitations [ ] ; Syncope [ ] ; Presyncope [ ] ; Paroxysmal nocturnal dyspnea[ ]   Pulmonary: Cough [y] Wheezing[ ] ; Hemoptysis[ ] ; Sputum [ ] ; Snoring [ ]   GI: Vomiting[ ] ; Dysphagia[ ] ; Melena[ ] ; Hematochezia [ ] ; Heartburn[ ] ; Abdominal pain [ ] ; Constipation [ ] ; Diarrhea [ ] ; BRBPR [ ]   GU: Hematuria[ ] ; Dysuria [ ] ; Nocturia[ ]   Vascular: Pain in legs with walking [ ] ; Pain in feet with lying flat [ ] ; Non-healing sores [ ] ; Stroke [ ] ; TIA [ ] ; Slurred speech [ ] ;  Neuro: Headaches[ ] ; Vertigo[ ] ; Seizures[ ] ; Paresthesias[ ] ;Blurred vision [ ] ; Diplopia [ ] ; Vision changes [ ]   Ortho/Skin: Arthritis [y; Joint pain [y; Muscle pain [ ] ; Joint swelling [ ] ; Back Pain [ ] ; Rash [ ]   Psych: Depression[ ] ; Anxiety[ ]   Heme: Bleeding problems [ ] ; Clotting disorders [ ] ; Anemia [ ]   Endocrine: Diabetes [ ] ; Thyroid dysfunction[ ]    Past Medical History  Diagnosis Date  . Essential hypertension   . Myocardial infarction acute ([place])   . Ischemic cardiomyopathy     EF 20% by echo  2013  . Other pulmonary embolism and infarction     On coumadin  . Hyperlipidemia   . DOE (dyspnea on exertion)   . CAD (coronary artery disease)   . CVA (cerebral infarction)   . Anxiety   . Tricuspid regurgitation     Severe  . Pulmonary hypertension (HCC)     56 mm Hg  . Cirrhosis ([place])     Current Outpatient Prescriptions  Medication Sig Dispense Refill  . aspirin 81 MG tablet Take 81 mg by mouth daily as needed for pain.    . carvedilol (COREG) 6.25 MG tablet Take 0.5 tablets (3.125 mg total) by mouth 2 (two) times daily with a  meal. 30 tablet 0  . ferrous sulfate 325 (65 FE) MG tablet Take 325 mg by mouth daily with breakfast.    . isosorbide-hydrALAZINE (BIDIL) 20-37.5 MG tablet Take 0.5 tablets by mouth 3 (three) times daily. 45 tablet 0  . lisinopril (PRINIVIL,ZESTRIL) 5 MG tablet Take 5 mg by mouth daily.    . metolazone (ZAROXOLYN) 5 MG tablet Take 5 mg by mouth as needed. Patient take 2.5 mg as needed for swelling.    . polyethylene glycol (MIRALAX / GLYCOLAX) packet Take 17 g by mouth daily as needed.    . potassium chloride SA (K-DUR,KLOR-CON) 20 MEQ tablet Take 20 mEq by mouth as needed (hand cramping).    . torsemide (DEMADEX) 20 MG tablet Take 3 tablets (60 mg total) by mouth 2 (two) times daily. 180 tablet 6  . traMADol (ULTRAM) 50 MG tablet Take by mouth every 6 (six) hours as needed for moderate pain.     [name] warfarin (COUMADIN) 3 MG tablet Take 2 tablets daily or as directed 180 tablet 3   No current facility-administered medications for this encounter.   No Known Allergies   Social History   Social History  . Marital Status: Married    Spouse Name: N/A  . Number of Children: 4  . Years of Education: N/A   Occupational History  . Disabled.    Social History Main Topics  . Smoking status: Former Smoker -- 0.50 packs/day for 38 years    Types: Cigarettes    Start date: 12/30/1956    Quit date: 06/06/1995  . Smokeless tobacco: Never Used  . Alcohol Use: No     Comment: Quit in 1997. Drank on the weekends  . Drug Use: No  . Sexual Activity: Not on file   Other Topics Concern  . Not on file   Social History Narrative   Lives with wife and brother in [place] and grandson in [place].    Family History  Problem Relation Age of Onset  . Heart disease Mother     CAD  . Leukemia Father     Danley Danker Vitals:   11/03/15 1359  BP: 82/56  Pulse: 81  Weight:  179 lb (81.194 kg)  SpO2: 99%   Wt Readings from Last 3 Encounters:  11/03/15 179 lb (81.194 kg)  10/21/15 159 lb 6.4 oz (72.303 kg)  10/20/15 159 lb (72.122 kg)     PHYSICAL EXAM: General: Elderly appearing. Fatigued appearing.  HEENT: normal Neck: supple. JVP to ear. Carotids 2+ bilat; no bruits. No lymphadenopathy or thyromegaly appreciated. Cor: PMI nondisplaced. RRR. No rubs. 2/6 MR. + S3. Lungs: Diminished bases.  Abdomen: soft, NT, ND, no HSM. No bruits or masses. +BS Pulsatile Liver Extremities: no cyanosis, clubbing, rash. 2+ edema to knees Neuro: alert & oriented x  3, cranial nerves grossly intact. moves all 4 extremities w/o difficulty. Affect pleasant.   ASSESSMENT & PLAN:  1. Acute on chronic combined CHF - Echo 10/08/15 LVEF 15-20% with ICM 2. CAD s/p CABG 3. HTN 4. ETOH abuse 5. Hx of CVA  Pt is markedly volume overloaded on exam. He has severe biventricular failure. Will admit for IV diuresis and further work up. He will need R/LHC once more adequately diuresed. Suspect will diurese over weekend and cath next week.   Will admit to stepdown for PICC line with CVP and Coox. May need milrinone.   Hold coreg with soft BP and concerns for low output. Continue bidil as tolerated.   Needs to stop ETOH completely.   With his degree of disease, he may have very poor prognosis depending on response to treatment.  Will likely have to visit goals of care this admission.    Legrand Como [address]" [place]ass[place], PA-C 11/03/2015 3:26 PM   Patient seen and examined with Oda Kilts, PA-C. We discussed all aspects of the encounter. I agree with the assessment and plan as stated above.   He has severe biventricular HF with marked volume overload. Will admit for IV diuresis. Will likely need cath prior to d/c. Discussed need to stop ETOH completely.   Jovanka Westgate,MD 4:46 PM

## 2015-11-03 NOTE — Progress Notes (Signed)
Peripherally Inserted Central Catheter/Midline Placement  The IV Nurse has discussed with the patient and/or persons authorized to consent for the patient, the purpose of this procedure and the potential benefits and risks involved with this procedure.  The benefits include less needle sticks, lab draws from the catheter and patient may be discharged home with the catheter.  Risks include, but not limited to, infection, bleeding, blood clot (thrombus formation), and puncture of an artery; nerve damage and irregular heat beat.  Alternatives to this procedure were also discussed.  PICC/Midline Placement Documentation        Rick [name] 11/03/2015, 5:16 PM

## 2015-11-03 NOTE — Progress Notes (Signed)
Patient ID: STEPHAN STRANZ, male   DOB: 1946-01-04, 70 y.o.   MRN: [id number]     Advanced Heart Failure Clinic Note   Referring Physician: Dr Harl Bowie Primary Care: Wende Neighbors, MD Primary Cardiologist: Dr Harl Bowie  HPI: DAXTEN CARDONI is a 70 y.o. male with history of ischemic cardiomyopathy status post CABG in 2003 as well as previous stenting. Most recent 2-D echo in 2016 EF 25% with restrictive diastolic dysfunction, moderate RV dysfunction PAS P 64, status post ICD followed by Dr. Rayann Heman with normal check in 02/2015. Saw Dr. Harl Bowie in the office 10/07/15 after recent change from Lasix to torsemide  Pt admitted 5/4 - 10/13/15 with recurrent volume overload in setting of dietary non-compliance. Echo during that admission showed EF worsening to 15-20% from 25%.  Seen in Dr. Harl Bowie office 10/20/15. Mild volume overload with dietary indiscretion. Torsemide increased to 60/40 mg. Weight at that visit 159 lbs  He presents today for regular follow up.  At last visit was volume overloaded. Cut back coreg with some concern for low output, and torsemide increased. Weight is up 20 lbs from last visit (2 weeks ago).  Has not taken metolazone.  Does not weigh daily, though weight has came up over past two weeks. Most recently 172 at home (was 156 at home at last visit).  He is SOB walking a short distance, but not with getting dressed or showering. Can't make it to the mailbox without feeling SOB any more. Has had several beers this week, states he is trying to cut back.  He is not watching his salt. Has been drinking a lot of water because of the heat.  Has had some chest pressure as his weight has come up, not related to exertion. Much more fatigued.   Echo 10/08/15 LVEF 15-20%, Severe LV dilation, Severe hypokinesis with areas of akinesis, Trivial AI, Mild MR, Mod LAE, Severe RV dilation, Severe TR, PA peak pressure 36 mm Hg Echo 03/04/15 LVEF 25%,  Severe hypokinesis, Trivial AI, Mild MR, RV mod dilated and mod  reduced, Severe RAE, Mod TR, PA peak pressure 64 mm Hg.    Past Medical History  Diagnosis Date  . Essential hypertension   . Myocardial infarction acute ([place])   . Ischemic cardiomyopathy     EF 20% by echo 2013  . Other pulmonary embolism and infarction     On coumadin  . Hyperlipidemia   . DOE (dyspnea on exertion)   . CAD (coronary artery disease)   . CVA (cerebral infarction)   . Anxiety   . Tricuspid regurgitation     Severe  . Pulmonary hypertension (HCC)     56 mm Hg  . Cirrhosis ([place])     Current Outpatient Prescriptions  Medication Sig Dispense Refill  . aspirin 81 MG tablet Take 81 mg by mouth daily as needed for pain.    . carvedilol (COREG) 6.25 MG tablet Take 0.5 tablets (3.125 mg total) by mouth 2 (two) times daily with a meal. 30 tablet 0  . ferrous sulfate 325 (65 FE) MG tablet Take 325 mg by mouth daily with breakfast.    . isosorbide-hydrALAZINE (BIDIL) 20-37.5 MG tablet Take 0.5 tablets by mouth 3 (three) times daily. 45 tablet 0  . lisinopril (PRINIVIL,ZESTRIL) 5 MG tablet Take 5 mg by mouth daily.    . metolazone (ZAROXOLYN) 5 MG tablet Take 5 mg by mouth as needed. Patient take 2.5 mg as needed for swelling.    . polyethylene glycol (  MIRALAX / GLYCOLAX) packet Take 17 g by mouth daily as needed.    . potassium chloride SA (K-DUR,KLOR-CON) 20 MEQ tablet Take 20 mEq by mouth as needed (hand cramping).    . torsemide (DEMADEX) 20 MG tablet Take 3 tablets (60 mg total) by mouth 2 (two) times daily. 180 tablet 6  . traMADol (ULTRAM) 50 MG tablet Take by mouth every 6 (six) hours as needed for moderate pain.     [name] warfarin (COUMADIN) 3 MG tablet Take 2 tablets daily or as directed 180 tablet 3   No current facility-administered medications for this encounter.    No Known Allergies    Social History   Social History  . Marital Status: Married    Spouse Name: N/A  . Number of Children: 4  . Years of Education: N/A   Occupational History  . Disabled.      Social History Main Topics  . Smoking status: Former Smoker -- 0.50 packs/day for 38 years    Types: Cigarettes    Start date: 12/30/1956    Quit date: 06/06/1995  . Smokeless tobacco: Never Used  . Alcohol Use: No     Comment: Quit in 1997. Drank on the weekends  . Drug Use: No  . Sexual Activity: Not on file   Other Topics Concern  . Not on file   Social History Narrative   Lives with wife and brother in [place] and grandson in [place].        Family History  Problem Relation Age of Onset  . Heart disease Mother     CAD  . Leukemia Father     Danley Danker Vitals:   11/03/15 1359  BP: 82/56  Pulse: 81  Weight: 179 lb (81.194 kg)  SpO2: 99%   Wt Readings from Last 3 Encounters:  11/03/15 179 lb (81.194 kg)  10/21/15 159 lb 6.4 oz (72.303 kg)  10/20/15 159 lb (72.122 kg)     PHYSICAL EXAM: General:  Elderly appearing. Fatigued appearing.  HEENT: normal Neck: supple. JVP to ear. Carotids 2+ bilat; no bruits. No lymphadenopathy or thyromegaly appreciated. Cor: PMI nondisplaced. RRR. No rubs. 2/6 MR. + S3. Lungs: Diminished bases.  Abdomen: soft, NT, ND, no HSM. No bruits or masses. +BS Pulsatile Liver Extremities: no cyanosis, clubbing, rash. 2+ edema to knees Neuro: alert & oriented x 3, cranial nerves grossly intact. moves all 4 extremities w/o difficulty. Affect pleasant.   ASSESSMENT & PLAN:  1. Acute on chronic combined CHF - Echo 10/08/15 LVEF 15-20% with ICM 2. CAD s/p CABG 3. HTN 4. ETOH abuse 5. Hx of CVA  Pt is markedly volume overloaded on exam. He has severe biventricular failure. Will admit for IV diuresis and further work up.  He will need R/LHC once more adequately diuresed.  Suspect will diurese over weekend and cath next week.   Will admit to stepdown for PICC line with CVP and Coox. May need milrinone.   Hold coreg with soft BP and concerns for low output.  Continue bidil as tolerated.   Needs to stop ETOH completely.   Legrand Como [address]"  [place], [place] 11/03/2015

## 2015-11-03 NOTE — Progress Notes (Signed)
Hemoglobin down to 7.7 from 9.2 over two weeks. No overt bleeding noted.   Will check FOBT.  Follow in am.  May need GI consult.  He is on coumadin.   Legrand Como [address] [place], [place] 11/03/2015 3:43 PM

## 2015-11-03 NOTE — Progress Notes (Signed)
ANTICOAGULATION CONSULT NOTE - Initial Consult  Pharmacy Consult for warfarin Indication: hx of PE  No Known Allergies  Patient Measurements:     Vital Signs: BP: 82/56 mmHg (05/31 1359) Pulse Rate: 81 (05/31 1359)  Labs:  Recent Labs  11/03/15 1453 11/03/15 1454  HGB  --  7.7*  HCT  --  26.2*  PLT  --  143*  CREATININE 1.81*  --     Estimated Creatinine Clearance: 41 mL/min (by C-G formula based on Cr of 1.81).   Medical History: Past Medical History  Diagnosis Date  . Essential hypertension   . Myocardial infarction acute ([place])   . Ischemic cardiomyopathy     EF 20% by echo 2013  . Other pulmonary embolism and infarction     On coumadin  . Hyperlipidemia   . DOE (dyspnea on exertion)   . CAD (coronary artery disease)   . CVA (cerebral infarction)   . Anxiety   . Tricuspid regurgitation     Severe  . Pulmonary hypertension (HCC)     56 mm Hg  . Cirrhosis ([place])     Assessment: Rick [name] with history of PE on warfarin. Pharmacy consulted to dose inpatient. INR 1.81 on admit. Hg down to 7.7 from 9.2 over 2 weeks, plt 143, no bleed documented. MD to check FOBT and f/u in AM.  PTA warfarin dose: 3mg  daily (last dose pta 5/30 per patient)  Goal of Therapy:  INR 2-3 Monitor platelets by anticoagulation protocol: Yes   Plan:  Warfarin 4mg  x 1 dose tonight Daily INR Monitor s/sx bleeding   Elicia Lamp, PharmD, BCPS Clinical Pharmacist Pager [phone] 11/03/2015 3:41 PM

## 2015-11-03 NOTE — Addendum Note (Signed)
Encounter addended by: Kerry Dory, CMA on: 11/03/2015  2:42 PM<BR>     Documentation filed: Visit Diagnoses, Dx Association, Orders

## 2015-11-04 ENCOUNTER — Ambulatory Visit: Payer: Medicare Other | Admitting: Adult Health

## 2015-11-04 ENCOUNTER — Inpatient Hospital Stay (HOSPITAL_COMMUNITY): Payer: Medicare Other

## 2015-11-04 DIAGNOSIS — I110 Hypertensive heart disease with heart failure: Secondary | ICD-10-CM | POA: Diagnosis not present

## 2015-11-04 DIAGNOSIS — E876 Hypokalemia: Secondary | ICD-10-CM

## 2015-11-04 DIAGNOSIS — R570 Cardiogenic shock: Secondary | ICD-10-CM

## 2015-11-04 LAB — CBC WITH DIFFERENTIAL/PLATELET
BASOS PCT: 0 %
Basophils Absolute: 0 10*3/uL (ref 0.0–0.1)
EOS ABS: 0.1 10*3/uL (ref 0.0–0.7)
Eosinophils Relative: 1 %
HCT: 23.7 % — ABNORMAL LOW (ref 39.0–52.0)
Hemoglobin: 7.2 g/dL — ABNORMAL LOW (ref 13.0–17.0)
LYMPHS ABS: 1.1 10*3/uL (ref 0.7–4.0)
Lymphocytes Relative: 22 %
MCH: 26.4 pg (ref 26.0–34.0)
MCHC: 30.4 g/dL (ref 30.0–36.0)
MCV: 86.8 fL (ref 78.0–100.0)
MONO ABS: 0.9 10*3/uL (ref 0.1–1.0)
Monocytes Relative: 17 %
NEUTROS ABS: 2.9 10*3/uL (ref 1.7–7.7)
NEUTROS PCT: 60 %
PLATELETS: 123 10*3/uL — AB (ref 150–400)
RBC: 2.73 MIL/uL — ABNORMAL LOW (ref 4.22–5.81)
RDW: 22.7 % — AB (ref 11.5–15.5)
WBC: 5 10*3/uL (ref 4.0–10.5)

## 2015-11-04 LAB — BASIC METABOLIC PANEL
Anion gap: 8 (ref 5–15)
BUN: 46 mg/dL — AB (ref 6–20)
CALCIUM: 8.9 mg/dL (ref 8.9–10.3)
CO2: 24 mmol/L (ref 22–32)
CREATININE: 1.54 mg/dL — AB (ref 0.61–1.24)
Chloride: 104 mmol/L (ref 101–111)
GFR calc non Af Amer: 44 mL/min — ABNORMAL LOW (ref >60)
GFR, EST AFRICAN AMERICAN: 51 mL/min — AB (ref >60)
Glucose, Bld: 132 mg/dL — ABNORMAL HIGH (ref 65–99)
Potassium: 3.5 mmol/L (ref 3.5–5.1)
Sodium: 136 mmol/L (ref 135–145)

## 2015-11-04 LAB — VITAMIN B12: VITAMIN B 12: 806 pg/mL (ref 180–914)

## 2015-11-04 LAB — CARBOXYHEMOGLOBIN
Carboxyhemoglobin: 2.4 % — ABNORMAL HIGH (ref 0.5–1.5)
Methemoglobin: 0.7 % (ref 0.0–1.5)
O2 SAT: 65 %
Total hemoglobin: 7.5 g/dL — ABNORMAL LOW (ref 13.5–18.0)

## 2015-11-04 LAB — IRON AND TIBC
IRON: 20 ug/dL — AB (ref 45–182)
SATURATION RATIOS: 6 % — AB (ref 17.9–39.5)
TIBC: 356 ug/dL (ref 250–450)
UIBC: 336 ug/dL

## 2015-11-04 LAB — FOLATE: FOLATE: 15.6 ng/mL (ref >5.9)

## 2015-11-04 LAB — PROTIME-INR
INR: 1.8 — AB (ref 0.00–1.49)
Prothrombin Time: 20.8 seconds — ABNORMAL HIGH (ref 11.6–15.2)

## 2015-11-04 LAB — FERRITIN: FERRITIN: 55 ng/mL (ref 24–336)

## 2015-11-04 MED ORDER — METOLAZONE 5 MG PO TABS
2.5000 mg | ORAL_TABLET | Freq: Once | ORAL | Status: AC
  Administered 2015-11-04: 2.5 mg via ORAL
  Filled 2015-11-04: qty 1

## 2015-11-04 MED ORDER — FERUMOXYTOL INJECTION 510 MG/17 ML
510.0000 mg | Freq: Once | INTRAVENOUS | Status: AC
  Administered 2015-11-04: 510 mg via INTRAVENOUS
  Filled 2015-11-04: qty 17

## 2015-11-04 MED ORDER — WARFARIN SODIUM 4 MG PO TABS
4.0000 mg | ORAL_TABLET | Freq: Once | ORAL | Status: AC
  Administered 2015-11-04: 4 mg via ORAL
  Filled 2015-11-04: qty 1

## 2015-11-04 MED ORDER — POTASSIUM CHLORIDE CRYS ER 20 MEQ PO TBCR
40.0000 meq | EXTENDED_RELEASE_TABLET | Freq: Two times a day (BID) | ORAL | Status: DC
  Administered 2015-11-04 – 2015-11-09 (×12): 40 meq via ORAL
  Filled 2015-11-04 (×12): qty 2

## 2015-11-04 MED ORDER — FUROSEMIDE 10 MG/ML IJ SOLN
80.0000 mg | Freq: Three times a day (TID) | INTRAMUSCULAR | Status: DC
  Administered 2015-11-04 – 2015-11-07 (×10): 80 mg via INTRAVENOUS
  Filled 2015-11-04 (×10): qty 8

## 2015-11-04 NOTE — Evaluation (Signed)
Physical Therapy Evaluation Patient Details Name: Rick [name] MRN: [id number] DOB: May 14, 1946 Today's Date: 11/04/2015   History of Present Illness  Admitted 11/03/15 with marked volume overload, found to be in cardiogenic shock with initial coox of 41%. Started on milrinone.   Clinical Impression  Pt admitted with above diagnosis. Pt currently with functional limitations due to the deficits listed below (see PT Problem List). Pt was able to ambulate in hallway with good safety overall.  Does tend to rely on one UE support and uses cane at home. Should continue to use cane and HHPT recommended. Wife in home 24 hours as she cares for her brother in the home as well.  Will follow acutely.  Pt will benefit from skilled PT to increase their independence and safety with mobility to allow discharge to the venue listed below.      Follow Up Recommendations Home health PT;Supervision/Assistance - 24 hour    Equipment Recommendations  None recommended by PT    Recommendations for Other Services       Precautions / Restrictions Precautions Precautions: Fall Restrictions Weight Bearing Restrictions: No      Mobility  Bed Mobility Overal bed mobility: Independent                Transfers Overall transfer level: Independent                  Ambulation/Gait Ambulation/Gait assistance: Supervision Ambulation Distance (Feet): 300 Feet Assistive device:  (pushed IV pole some of time) Gait Pattern/deviations: Step-through pattern;Decreased stride length   Gait velocity interpretation: Below normal speed for age/gender General Gait Details: Pt ambulated well overall.  Pt without LOB without UE support in controlled environment.  With challenges, pt would benefit from continued use of cane.  Pt aware.  Stairs            Wheelchair Mobility    Modified Rankin (Stroke Patients Only)       Balance Overall balance assessment: Needs assistance Sitting-balance support:  No upper extremity supported;Feet supported Sitting balance-Leahy Scale: Fair     Standing balance support: No upper extremity supported;During functional activity Standing balance-Leahy Scale: Fair Standing balance comment: can stand statically without UE support.              High level balance activites: Direction changes;Sudden stops;Head turns High Level Balance Comments: all of above with min guard assist.  Able to self correct.  Standardized Balance Assessment Standardized Balance Assessment : Dynamic Gait Index   Dynamic Gait Index Level Surface: Normal Change in Gait Speed: Normal Gait with Horizontal Head Turns: Mild Impairment Gait with Vertical Head Turns: Mild Impairment Gait and Pivot Turn: Mild Impairment Step Over Obstacle: Normal Step Around Obstacles: Normal Steps: Mild Impairment Total Score: 20       Pertinent Vitals/Pain Pain Assessment: No/denies pain  VSS    Home Living Family/patient expects to be discharged to:: Private residence Living Arrangements: Spouse/significant other;Children;Other relatives Available Help at Discharge: Family;Available 24 hours/day Type of Home: House Home Access: Level entry     Home Layout: One level Home Equipment: Bedside commode;Shower seat;Cane - single point Additional Comments: Pt has been on disability for awhile now per report.  Ofnote, wifes brother had a CVA and wife cares for him in the home.  He walks with a RW.      Prior Function Level of Independence: Independent with assistive device(s)         Comments: He used cane PTA.  Hand Dominance        Extremity/Trunk Assessment   Upper Extremity Assessment: Defer to OT evaluation           Lower Extremity Assessment: Generalized weakness      Cervical / Trunk Assessment: Normal  Communication   Communication: No difficulties  Cognition Arousal/Alertness: Awake/alert Behavior During Therapy: WFL for tasks  assessed/performed Overall Cognitive Status: Within Functional Limits for tasks assessed                      General Comments General comments (skin integrity, edema, etc.): Pt 20/24 on DGI suggesting low risk of falls.  Pt still probably will use cane as he states some days are better than others.     Exercises        Assessment/Plan    PT Assessment Patient needs continued PT services  PT Diagnosis Generalized weakness   PT Problem List Decreased balance;Decreased activity tolerance;Decreased mobility;Decreased knowledge of use of DME;Decreased safety awareness;Decreased knowledge of precautions  PT Treatment Interventions DME instruction;Gait training;Functional mobility training;Therapeutic activities;Therapeutic exercise;Balance training;Patient/family education   PT Goals (Current goals can be found in the Care Plan section) Acute Rehab PT Goals Patient Stated Goal: to get better PT Goal Formulation: With patient Time For Goal Achievement: 11/18/15 Potential to Achieve Goals: Good    Frequency Min 3X/week   Barriers to discharge        Co-evaluation               End of Session Equipment Utilized During Treatment: Gait belt Activity Tolerance: Patient limited by fatigue Patient left: in bed;with bed alarm set;with call bell/phone within reach (sitting on EOB) Nurse Communication: Mobility status         Time: [id number] PT Time Calculation (min) (ACUTE ONLY): 20 min   Charges:   PT Evaluation $PT Eval Moderate Complexity: 1 Procedure     PT G CodesIrwin Brakeman F 16-Nov-2015, 11:11 AM  Harvin Konicek,PT Acute Rehabilitation [phone] (pager)

## 2015-11-04 NOTE — Progress Notes (Addendum)
Advanced Heart Failure Rounding Note  Primary Care: Wende Neighbors, MD  Primary Cardiologist: Dr Harl Bowie   Subjective:    Admitted 11/03/15 with marked volume overload, found to be in cardiogenic shock with initial coox of 41%. Started on milrinone.   Coox 65% this am on milrinone 0.25. Down 1 lb with two doses of 80 mg IV lasix yesterday. Pressures improved.  CVP 19-20  Feels "alot" better this morning.  Less fatigued and breathing is much better.  Peeing well.  Doesn't mind using urinal, wants to avoid catheter for now.   No overt bleeding.  Has occasional blood streaked snot when blowing nose. Denies BRBPR or melena.   Out 0.7 L and down 1.5 lbs on IV lasix 80 mg BID.   Objective:   Weight Range: 174 lb 4.8 oz (79.062 kg) Body mass index is 24.32 kg/(m^2).   Vital Signs:   Temp:  [97.3 F (36.3 C)-98.5 F (36.9 C)] 97.8 F (36.6 C) (06/01 0437) Pulse Rate:  [68-74] 68 (06/01 0437) Resp:  [11-24] 12 (06/01 0437) BP: (88-100)/(62-73) 99/69 mmHg (06/01 0437) SpO2:  [99 %-100 %] 100 % (06/01 0437) Weight:  [174 lb 4.8 oz (79.062 kg)-175 lb 14.4 oz (79.788 kg)] 174 lb 4.8 oz (79.062 kg) (06/01 0437) Last BM Date: 11/02/15  Weight change: Filed Weights   11/03/15 1548 11/04/15 0437  Weight: 175 lb 14.4 oz (79.788 kg) 174 lb 4.8 oz (79.062 kg)    Intake/Output:   Intake/Output Summary (Last 24 hours) at 11/04/15 0713 Last data filed at 11/04/15 0440  Gross per 24 hour  Intake    990 ml  Output   2375 ml  Net  -1385 ml     Physical Exam: CVP 19-20 General: Elderly appearing. Fatigued appearing.  HEENT: normal Neck: supple. JVP to ear (14 cm+). Carotids 2+ bilat; no bruits. No thyromegaly or nodule noted.  Cor: PMI nondisplaced. RRR. No rubs. 2/6 MR. + S3. Lungs: Mild basilar crackles.  Abdomen: soft, non tender, non-distended, no HSM. No bruits or masses. +BS Pulsatile Liver Extremities: no cyanosis, clubbing, rash. 2+ edema to knees Neuro: alert & oriented x  3, cranial nerves grossly intact. moves all 4 extremities w/o difficulty. Affect pleasant.   Telemetry: NSR - to Sinus Brady 60-70s. Occasional PVCs  Labs: CBC  Recent Labs  11/03/15 1454  WBC 5.8  HGB 7.7*  HCT 26.2*  MCV 86.5  PLT [id number]*   Basic Metabolic Panel  Recent Labs  11/03/15 1453 11/04/15 0500  NA 135 136  K 3.8 3.5  CL 103 104  CO2 23 24  GLUCOSE 102* 132*  BUN 49* 46*  CREATININE 1.81* 1.54*  CALCIUM 9.2 8.9  MG 2.1  --    Liver Function Tests No results for input(s): AST, ALT, ALKPHOS, BILITOT, PROT, ALBUMIN in the last 72 hours. No results for input(s): LIPASE, AMYLASE in the last 72 hours. Cardiac Enzymes No results for input(s): CKTOTAL, CKMB, CKMBINDEX, TROPONINI in the last 72 hours.  BNP: BNP (last 3 results)  Recent Labs  10/07/15 1227 11/03/15 1455  BNP 2346.0* 2344.5*    ProBNP (last 3 results) No results for input(s): PROBNP in the last 8760 hours.   D-Dimer No results for input(s): DDIMER in the last 72 hours. Hemoglobin A1C No results for input(s): HGBA1C in the last 72 hours. Fasting Lipid Panel No results for input(s): CHOL, HDL, LDLCALC, TRIG, CHOLHDL, LDLDIRECT in the last 72 hours. Thyroid Function Tests No results for input(s):  TSH, T4TOTAL, T3FREE, THYROIDAB in the last 72 hours.  Invalid input(s): FREET3  Other results:     Imaging/Studies:  Dg Chest Port 1 View  11/03/2015  CLINICAL DATA:  PICC placement EXAM: PORTABLE CHEST 1 VIEW COMPARISON:  10/07/2015 FINDINGS: Significant cardiac enlargement unchanged. Right PICC line has been placed with tip to the cavoatrial junction. No right-sided pneumothorax. Straight vertical edge lateral left thorax on likely to represent pneumothorax given its appearance. It likely represents a skin fold. IMPRESSION: Right PICC line as described with tip to the cavoatrial junction. Electronically Signed   By: Skipper Cliche M.D.   On: 11/03/2015 18:19     Latest Echo  Latest  Cath   Medications:     Scheduled Medications: . ferrous sulfate  325 mg Oral Q breakfast  . furosemide  80 mg Intravenous BID  . isosorbide-hydrALAZINE  0.5 tablet Oral TID  . pneumococcal 23 valent vaccine  0.5 mL Intramuscular Tomorrow-1000  . potassium chloride  20 mEq Oral BID  . sodium chloride flush  10-40 mL Intracatheter Q12H  . sodium chloride flush  3 mL Intravenous Q12H  . Warfarin - Pharmacist Dosing Inpatient   Does not apply q1800     Infusions: . milrinone 0.25 mcg/kg/min (11/03/15 2251)     PRN Medications:  sodium chloride, acetaminophen, ondansetron (ZOFRAN) IV, polyethylene glycol, sodium chloride flush, sodium chloride flush, traMADol   Assessment   1. 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 CAD s/p CABG 3. HTN 4. ETOH abuse 5. Hx of CVA 6. Anemia, iron deficient  7. Hypokalemia   Plan    Feels better this am but remains markedly volume overload.   Coox 65% on milrinone 0.25 mcg/kg/min.  Increase lasix to 80 mg IV q 8 hrs.  Will give dose of 2.5 metolazone this am.   Increase K supp.   Awaiting FOBT.  Will check Iron stores. CBC pending for this am.  May be related to chronic disease with no overt bleeding.  Will follow.  If Hgb < 7.0 will transfuse.   Lives at home with wife.  States he is willing to do whatever it takes to get better, including going home with milrinone.  Full Code. Can revisit as necessary.   Length of Stay: 1   Shirley Friar PA-C 11/04/2015, 7:13 AM  Advanced Heart Failure Team Pager [phone] (M-F; 7a - 4p)  Please contact [facility] for night-coverage after hours (4p -7a ) and weekends on [email or web address]  Patient seen and examined with Oda Kilts, PA-C. We discussed all aspects of the encounter. I agree with the assessment and plan as stated above.   PICC placed and co-ox showed cardiogenic shock physiology. Started on milrinone. Now feeling better and  beginning to diurese. CVP remains ~ 20 in setting of severe biventricular HF. Renal function improving with inotropic support. Agree with increasing diuretics as tolerated. Supp K+. Anemia work-up ongoing. Will likely need transfusion. I will order T&S for am. He is not candidate for advanced therapies. May need to consider home inotropes for palliation.  He is iron deficient. Will give Feraheme.   Bensimhon, Daniel,MD 7:05 PM

## 2015-11-04 NOTE — Progress Notes (Signed)
ANTICOAGULATION CONSULT NOTE - Follow Up Consult  Pharmacy Consult for Coumadin Indication: pulmonary embolus history  No Known Allergies  Patient Measurements: Height: 5\' 11"  (180.3 cm) Weight: 174 lb 4.8 oz (79.062 kg) IBW/kg (Calculated) : 75.3  Vital Signs: Temp: 97.4 F (36.3 C) (06/01 0752) Temp Source: Oral (06/01 0752) BP: 99/69 mmHg (06/01 0437) Pulse Rate: 68 (06/01 0437)  Labs:  Recent Labs  11/03/15 1453 11/03/15 1454 11/03/15 1624 11/04/15 0500 11/04/15 0820  HGB  --  7.7*  --   --  7.2*  HCT  --  26.2*  --   --  23.7*  PLT  --  143*  --   --  123*  LABPROT  --   --  20.9* 20.8*  --   INR  --   --  1.81* 1.80*  --   CREATININE 1.81*  --   --  1.54*  --     Estimated Creatinine Clearance: 48.2 mL/min (by C-G formula based on Cr of 1.54).   Medications:  Prescriptions prior to admission  Medication Sig Dispense Refill Last Dose  . aspirin 81 MG tablet Take 81 mg by mouth daily as needed for pain.   Past Week at Unknown time  . carvedilol (COREG) 6.25 MG tablet Take 0.5 tablets (3.125 mg total) by mouth 2 (two) times daily with a meal. 30 tablet 0 11/03/2015 at 0900  . ferrous sulfate 325 (65 FE) MG tablet Take 325 mg by mouth daily with breakfast.   11/03/2015 at Unknown time  . isosorbide-hydrALAZINE (BIDIL) 20-37.5 MG tablet Take 0.5 tablets by mouth 3 (three) times daily. 45 tablet 0 Past Week at Unknown time  . metolazone (ZAROXOLYN) 5 MG tablet Take 5 mg by mouth as needed. Patient take 2.5 mg as needed for swelling.   Past Week at Unknown time  . polyethylene glycol (MIRALAX / GLYCOLAX) packet Take 17 g by mouth daily as needed for mild constipation.    Past Month at Unknown time  . potassium chloride SA (K-DUR,KLOR-CON) 20 MEQ tablet Take 20 mEq by mouth as needed (hand cramping).   Past Month at Unknown time  . torsemide (DEMADEX) 20 MG tablet Take 3 tablets (60 mg total) by mouth 2 (two) times daily. 180 tablet 6 11/03/2015 at Unknown time  .  traMADol (ULTRAM) 50 MG tablet Take by mouth every 6 (six) hours as needed for moderate pain.    Past Month at Unknown time  . warfarin (COUMADIN) 3 MG tablet Take 2 tablets daily or as directed (Patient taking differently: Take 3-6 mg by mouth daily at 6 PM. Depending on INR) 180 tablet 3 11/02/2015 at 1800    Assessment: 70 yo M admitted with 20lb weight gain/HF exacerbation.  Pt on Coumadin PTA for hx PE.  INR remains slightly subtherapeutic.  Noted Hgb continues to trend down 7.7 > 7.2.  Pt denies BRBPR.  FOBT ordered.  Goal of Therapy:  INR 2-3 Monitor platelets by anticoagulation protocol: Yes   Plan:  Coumadin 4mg  PO x 1 tonight Daily INR Follo-up results of FOBT  Zosia Lucchese, Pharm.D., BCPS Clinical Pharmacist Pager [phone] 11/04/2015 10:01 AM

## 2015-11-05 ENCOUNTER — Encounter: Payer: Medicare Other | Admitting: Internal Medicine

## 2015-11-05 DIAGNOSIS — N179 Acute kidney failure, unspecified: Secondary | ICD-10-CM

## 2015-11-05 DIAGNOSIS — D509 Iron deficiency anemia, unspecified: Secondary | ICD-10-CM

## 2015-11-05 LAB — CBC WITH DIFFERENTIAL/PLATELET
BASOS PCT: 0 %
Basophils Absolute: 0 10*3/uL (ref 0.0–0.1)
EOS ABS: 0.1 10*3/uL (ref 0.0–0.7)
Eosinophils Relative: 1 %
HCT: 24.3 % — ABNORMAL LOW (ref 39.0–52.0)
HEMOGLOBIN: 7.3 g/dL — AB (ref 13.0–17.0)
LYMPHS ABS: 1.2 10*3/uL (ref 0.7–4.0)
Lymphocytes Relative: 21 %
MCH: 26 pg (ref 26.0–34.0)
MCHC: 30 g/dL (ref 30.0–36.0)
MCV: 86.5 fL (ref 78.0–100.0)
Monocytes Absolute: 0.8 10*3/uL (ref 0.1–1.0)
Monocytes Relative: 13 %
NEUTROS ABS: 3.6 10*3/uL (ref 1.7–7.7)
NEUTROS PCT: 64 %
Platelets: 138 10*3/uL — ABNORMAL LOW (ref 150–400)
RBC: 2.81 MIL/uL — AB (ref 4.22–5.81)
RDW: 22.5 % — ABNORMAL HIGH (ref 11.5–15.5)
WBC: 5.7 10*3/uL (ref 4.0–10.5)

## 2015-11-05 LAB — CARBOXYHEMOGLOBIN
Carboxyhemoglobin: 3.1 % — ABNORMAL HIGH (ref 0.5–1.5)
Carboxyhemoglobin: 2.5 % — ABNORMAL HIGH (ref 0.5–1.5)
METHEMOGLOBIN: 0.8 % (ref 0.0–1.5)
Methemoglobin: 1 % (ref 0.0–1.5)
O2 SAT: 58.7 %
O2 Saturation: 93.8 %
TOTAL HEMOGLOBIN: 7.8 g/dL — AB (ref 13.5–18.0)
TOTAL HEMOGLOBIN: 9.4 g/dL — AB (ref 13.5–18.0)

## 2015-11-05 LAB — PROTIME-INR
INR: 2 — ABNORMAL HIGH (ref 0.00–1.49)
Prothrombin Time: 22.5 seconds — ABNORMAL HIGH (ref 11.6–15.2)

## 2015-11-05 LAB — BASIC METABOLIC PANEL
Anion gap: 11 (ref 5–15)
BUN: 40 mg/dL — AB (ref 6–20)
CALCIUM: 9.3 mg/dL (ref 8.9–10.3)
CO2: 25 mmol/L (ref 22–32)
CREATININE: 1.39 mg/dL — AB (ref 0.61–1.24)
Chloride: 99 mmol/L — ABNORMAL LOW (ref 101–111)
GFR calc Af Amer: 58 mL/min — ABNORMAL LOW (ref >60)
GFR, EST NON AFRICAN AMERICAN: 50 mL/min — AB (ref >60)
Glucose, Bld: 138 mg/dL — ABNORMAL HIGH (ref 65–99)
POTASSIUM: 3.7 mmol/L (ref 3.5–5.1)
SODIUM: 135 mmol/L (ref 135–145)

## 2015-11-05 LAB — PREPARE RBC (CROSSMATCH)

## 2015-11-05 LAB — RETICULOCYTES
RBC.: 2.81 MIL/uL — AB (ref 4.22–5.81)
RETIC CT PCT: 5.8 % — AB (ref 0.4–3.1)
Retic Count, Absolute: 163 10*3/uL (ref 19.0–186.0)

## 2015-11-05 MED ORDER — WARFARIN SODIUM 2 MG PO TABS
3.0000 mg | ORAL_TABLET | Freq: Every day | ORAL | Status: DC
  Administered 2015-11-05: 3 mg via ORAL
  Filled 2015-11-05 (×2): qty 1

## 2015-11-05 MED ORDER — FUROSEMIDE 10 MG/ML IJ SOLN
40.0000 mg | Freq: Once | INTRAMUSCULAR | Status: AC
  Administered 2015-11-05: 40 mg via INTRAVENOUS
  Filled 2015-11-05: qty 4

## 2015-11-05 MED ORDER — SODIUM CHLORIDE 0.9 % IV SOLN
Freq: Once | INTRAVENOUS | Status: AC
  Administered 2015-11-05: 14:00:00 via INTRAVENOUS

## 2015-11-05 MED ORDER — SPIRONOLACTONE 25 MG PO TABS
12.5000 mg | ORAL_TABLET | Freq: Every day | ORAL | Status: DC
  Administered 2015-11-05 – 2015-11-12 (×8): 12.5 mg via ORAL
  Filled 2015-11-05 (×8): qty 1

## 2015-11-05 MED ORDER — LIVING BETTER WITH HEART FAILURE BOOK: Freq: Once | Status: DC

## 2015-11-05 NOTE — Progress Notes (Signed)
ANTICOAGULATION CONSULT NOTE - Follow Up Consult  Pharmacy Consult for Coumadin Indication: pulmonary embolus history  No Known Allergies  Patient Measurements: Height: 5\' 11"  (180.3 cm) Weight: 165 lb 12.8 oz (75.206 kg) IBW/kg (Calculated) : 75.3  Vital Signs: Temp: 97.6 F (36.4 C) (06/02 0840) Temp Source: Oral (06/02 0840) BP: 83/57 mmHg (06/02 0840) Pulse Rate: 65 (06/02 0840)  Labs:  Recent Labs  11/03/15 1453  11/03/15 1454 11/03/15 1624 11/04/15 0500 11/04/15 0820 11/05/15 0522  HGB  --   < > 7.7*  --   --  7.2* 7.3*  HCT  --   --  26.2*  --   --  23.7* 24.3*  PLT  --   --  143*  --   --  123* 138*  LABPROT  --   --   --  20.9* 20.8*  --  22.5*  INR  --   --   --  1.81* 1.80*  --  2.00*  CREATININE 1.81*  --   --   --  1.54*  --  1.39*  < > = values in this interval not displayed.  Estimated Creatinine Clearance: 53.3 mL/min (by C-G formula based on Cr of 1.39).   Medications:  Prescriptions prior to admission  Medication Sig Dispense Refill Last Dose  . aspirin 81 MG tablet Take 81 mg by mouth daily as needed for pain.   Past Week at Unknown time  . carvedilol (COREG) 6.25 MG tablet Take 0.5 tablets (3.125 mg total) by mouth 2 (two) times daily with a meal. 30 tablet 0 11/03/2015 at 0900  . ferrous sulfate 325 (65 FE) MG tablet Take 325 mg by mouth daily with breakfast.   11/03/2015 at Unknown time  . isosorbide-hydrALAZINE (BIDIL) 20-37.5 MG tablet Take 0.5 tablets by mouth 3 (three) times daily. 45 tablet 0 Past Week at Unknown time  . metolazone (ZAROXOLYN) 5 MG tablet Take 5 mg by mouth as needed. Patient take 2.5 mg as needed for swelling.   Past Week at Unknown time  . polyethylene glycol (MIRALAX / GLYCOLAX) packet Take 17 g by mouth daily as needed for mild constipation.    Past Month at Unknown time  . potassium chloride SA (K-DUR,KLOR-CON) 20 MEQ tablet Take 20 mEq by mouth as needed (hand cramping).   Past Month at Unknown time  . torsemide  (DEMADEX) 20 MG tablet Take 3 tablets (60 mg total) by mouth 2 (two) times daily. 180 tablet 6 11/03/2015 at Unknown time  . traMADol (ULTRAM) 50 MG tablet Take by mouth every 6 (six) hours as needed for moderate pain.    Past Month at Unknown time  . warfarin (COUMADIN) 3 MG tablet Take 2 tablets daily or as directed (Patient taking differently: Take 3-6 mg by mouth daily at 6 PM. Depending on INR) 180 tablet 3 11/02/2015 at 1800    Assessment: 70 yo M admitted with 20lb weight gain/HF exacerbation.  Pt on Coumadin PTA for hx PE.  INR 2 at goal after 2 doses boost of 4mg .  Potentially decreased absorption with volume overload.  Noted Hgb continues to trend down 7.7 > 7.3.  Pt denies BRBPR.  FOBT ordered. FE studies low could be anemia chronic Dz > Fereheme x1 would repeat next week.  Goal of Therapy:  INR 2-3 Monitor platelets by anticoagulation protocol: Yes   Plan:  Coumadin 3mg  daily - restart home dose Daily INR Follo-up results of FOBT  Bonnita Nasuti Pharm.D. CPP, BCPS Clinical  Pharmacist [phone] 11/05/2015 11:40 AM

## 2015-11-05 NOTE — Care Management Important Message (Signed)
Important Message  Patient Details  Name: Rick [name] MRN: [id number] Date of Birth: 10/30/1945   Medicare Important Message Given:  Yes    Loann Quill 11/05/2015, 9:40 AM

## 2015-11-05 NOTE — Progress Notes (Signed)
Received report from [employer].

## 2015-11-05 NOTE — Progress Notes (Signed)
Spoke with Dr. Sallyanne Kuster, started 1 (out of 2) units of blood at 7:37 pm. Instructed to give over the course of 4 hrs. After initial 1st 15 minute rate of 120 ml/hr  set at 83 ml/hr for a total of 335 mls of blood. Lasix 40 mg IV due to be given in-between units.

## 2015-11-05 NOTE — Progress Notes (Signed)
Advanced Heart Failure Rounding Note  Primary Care: Wende Neighbors, MD  Primary Cardiologist: Dr Harl Bowie   Subjective:    Admitted 11/03/15 with marked volume overload, found to be in cardiogenic shock with initial coox of 41%. Started on milrinone.   Coox falsely elevated this morning. Repeat pending on milrinone 0.25.  Creatinine continues to trend down. 1.81 -> 1.54 -> 1.39.  CVP 16-17. Hgb low but stable. 7.2 -> 7.3. Type and screen pending.   Continues to feel better.  Having some hand and leg cramps. Scant blood in snot when blowing nose still, but no frank epistaxis.  No BM since arrival per pt.  Worked with PT.  They recommend HHPT.   Out > 4 L and down 10 lbs on IV lasix 80 mg TID with 1 dose metolazone yesterday.  Objective:   Weight Range: 165 lb 12.8 oz (75.206 kg) Body mass index is 23.13 kg/(m^2).   Vital Signs:   Temp:  [97.2 F (36.2 C)-98.8 F (37.1 C)] 98.4 F (36.9 C) (06/02 0503) Pulse Rate:  [74-77] 77 (06/02 0503) Resp:  [12-15] 15 (06/02 0503) BP: (90-92)/(54-67) 90/67 mmHg (06/02 0503) SpO2:  [100 %] 100 % (06/02 0503) Weight:  [165 lb 12.8 oz (75.206 kg)] 165 lb 12.8 oz (75.206 kg) (06/02 0503) Last BM Date: 11/02/15  Weight change: Filed Weights   11/03/15 1548 11/04/15 0437 11/05/15 0503  Weight: 175 lb 14.4 oz (79.788 kg) 174 lb 4.8 oz (79.062 kg) 165 lb 12.8 oz (75.206 kg)    Intake/Output:   Intake/Output Summary (Last 24 hours) at 11/05/15 0718 Last data filed at 11/05/15 0507  Gross per 24 hour  Intake   1453 ml  Output   6350 ml  Net  -4897 ml     Physical Exam: CVP 16-17 General: Elderly appearing. Fatigued appearing.  HEENT: normal Neck: supple. JVP to ear (14 cm+). Carotids 2+ bilat; no bruits. No thyromegaly or nodule noted.  Cor: PMI nondisplaced. RRR. No rubs. 2/6 MR. + S3. Lungs: Mild basilar crackles.  Abdomen: soft, NT, ND, no HSM. No bruits or masses. +BS Pulsatile Liver Extremities: no cyanosis, clubbing, rash.1-2+  edema to knees Neuro: alert & oriented x 3, cranial nerves grossly intact. moves all 4 extremities w/o difficulty. Affect pleasant.   Telemetry: NSR 70s. Occasional PVCs  Labs: CBC  Recent Labs  11/03/15 1454 11/04/15 0820  WBC 5.8 5.0  NEUTROABS  --  2.9  HGB 7.7* 7.2*  HCT 26.2* 23.7*  MCV 86.5 86.8  PLT 143* [id number]*   Basic Metabolic Panel  Recent Labs  11/03/15 1453 11/04/15 0500 11/05/15 0522  NA 135 136 135  K 3.8 3.5 3.7  CL 103 104 99*  CO2 23 24 25   GLUCOSE 102* 132* 138*  BUN 49* 46* 40*  CREATININE 1.81* 1.54* 1.39*  CALCIUM 9.2 8.9 9.3  MG 2.1  --   --    Liver Function Tests No results for input(s): AST, ALT, ALKPHOS, BILITOT, PROT, ALBUMIN in the last 72 hours. No results for input(s): LIPASE, AMYLASE in the last 72 hours. Cardiac Enzymes No results for input(s): CKTOTAL, CKMB, CKMBINDEX, TROPONINI in the last 72 hours.  BNP: BNP (last 3 results)  Recent Labs  10/07/15 1227 11/03/15 1455  BNP 2346.0* 2344.5*    ProBNP (last 3 results) No results for input(s): PROBNP in the last 8760 hours.   D-Dimer No results for input(s): DDIMER in the last 72 hours. Hemoglobin A1C No results for input(s):  HGBA1C in the last 72 hours. Fasting Lipid Panel No results for input(s): CHOL, HDL, LDLCALC, TRIG, CHOLHDL, LDLDIRECT in the last 72 hours. Thyroid Function Tests No results for input(s): TSH, T4TOTAL, T3FREE, THYROIDAB in the last 72 hours.  Invalid input(s): FREET3  Other results:     Imaging/Studies:  Dg Chest 2 View  11/04/2015  CLINICAL DATA:  CHF. EXAM: CHEST  2 VIEW COMPARISON:  11/03/2015.  10/07/2015. FINDINGS: Right PICC line stable position. Cardiac pacer noted with lead tips in right atrium right ventricle. Prior CABG. Stable cardiomegaly. Minimal bibasilar subsegmental atelectasis. No pleural effusion or pneumothorax. IMPRESSION: 1. Right PICC line stable position. 2. Cardiac pacer in stable position. Prior CABG. Stable  cardiomegaly. Mild bibasilar subsegmental atelectasis. Electronically Signed   By: Marcello Moores  Register   On: 11/04/2015 08:00   Dg Chest Port 1 View  11/03/2015  CLINICAL DATA:  PICC placement EXAM: PORTABLE CHEST 1 VIEW COMPARISON:  10/07/2015 FINDINGS: Significant cardiac enlargement unchanged. Right PICC line has been placed with tip to the cavoatrial junction. No right-sided pneumothorax. Straight vertical edge lateral left thorax on likely to represent pneumothorax given its appearance. It likely represents a skin fold. IMPRESSION: Right PICC line as described with tip to the cavoatrial junction. Electronically Signed   By: Skipper Cliche M.D.   On: 11/03/2015 18:19    Latest Echo  Latest Cath   Medications:     Scheduled Medications: . ferrous sulfate  325 mg Oral Q breakfast  . furosemide  80 mg Intravenous TID  . isosorbide-hydrALAZINE  0.5 tablet Oral TID  . potassium chloride  40 mEq Oral BID  . sodium chloride flush  10-40 mL Intracatheter Q12H  . sodium chloride flush  3 mL Intravenous Q12H  . Warfarin - Pharmacist Dosing Inpatient   Does not apply q1800    Infusions: . milrinone 0.25 mcg/kg/min (11/05/15 0308)    PRN Medications: sodium chloride, acetaminophen, ondansetron (ZOFRAN) IV, polyethylene glycol, sodium chloride flush, sodium chloride flush, traMADol   Assessment   1. 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 CAD s/p CABG 3. HTN 4. ETOH abuse 5. Hx of CVA 6. Anemia, iron deficient  7. Hypokalemia   Plan    Continues to improve but remains markedly volume overloaded. Coox inaccurate this am. Repeat pending on milrinone 0.25 mcg/kg/min.  Continue lasix 80 mg IV q 8 hrs.  Repeat 2.5 metolazone x 1 this am.  supp K  Awaiting FOBT.  Iron stores markedly low 11/04/15. Got one dose Feraheme. Hgb low, but stable at 7.2. Type and Screen ordered. May be related to chronic disease with no overt bleeding.  Will  give 2 units today and continue to follow.   Lives at home with wife.  States he is willing to do whatever it takes to get better, including going home with milrinone.  Full Code. Can revisit as necessary.   Length of Stay: 2   Shirley Friar PA-C 11/05/2015, 7:18 AM  Advanced Heart Failure Team Pager [phone] (M-F; 7a - 4p)  Please contact [facility] for night-coverage after hours (4p -7a ) and weekends on [email or web address]  Patient seen and examined with Oda Kilts, PA-C. We discussed all aspects of the encounter. I agree with the assessment and plan as stated above.   Feels much better on milrinone. Co-ox ok 59%. Diuresing well but still volume overloaded. CVP still ~15. Renal function improving. Continue diuresis.Supp K+.  BP too low  to titrate Bidil. Add spiro 12.5.  I worry he may need home milrinone. Will try to wean once CVP down.   Hgb low. Iron deficient. Received Feraheme yesterday. He is due for coloscopy but is too tenuous from cardiac perspective now to tolerate.   Bensimhon, Daniel,MD 2:00 PM

## 2015-11-05 NOTE — Plan of Care (Signed)
Problem: Education: Goal: Ability to demonstrate managment of disease process will improve Outcome: Progressing Low sodium diet, fluid restriction, daily weights and increased mobility discussed with pt. "living better with heart failure" booklet given. Pt has no further questions at this time.

## 2015-11-05 NOTE — Progress Notes (Signed)
Patient had 2 nosebleeds this evening, minimal bleeding. Patient stated he blew his nose too hard. Bleeding stopped at this time. Paged cardiology with this information via amion. Will continue to monitor.

## 2015-11-05 NOTE — Care Management Note (Addendum)
Case Management Note  Patient Details  Name: Rick [name] MRN: [id number] Date of Birth: December 09, 1945  Subjective/Objective: Pt in for CHF. Initiated on IV Milrinone gtt. Pt is from home with spouse that is taking care of her brother as well.                    Action/Plan: CM did speak with pt in regards to disposition needs. Pt is agreeable to [facility] Services and Agency List Provided to pt- pt chose [facility] for [facility]. Pam with [facility] was notified that pt may need [facility] RN services for IV Milrinone gtt. CM will continue to monitor for disposition needs.    Expected Discharge Date:                  Expected Discharge Plan:  [place]  In-House Referral:  NA  Discharge planning Services  CM Consult  Post Acute Care Choice:  Home Health, Durable Medical Equipment Choice offered to:     DME Arranged:  IV pump/equipment DME Agency:  [place]:  RN [facility] Agency:  [place]  Status of Service:  Completed, signed off  Medicare Important Message Given:  Yes Date Medicare IM Given:    Medicare IM give by:    Date Additional Medicare IM Given:    Additional Medicare Important Message give by:     If discussed at [place] of Stay Meetings, dates discussed: 11-09-15   Additional Comments: 1029 11-12-15 Jacqlyn Krauss, RN,BSN [phone] Pt is planned to return home in [place]. Pt was initially set up with [facility] for [place] to work with [facility] for IV Milrinone. IV Milrinone not needed at this time. Plan for pt to return home with [facility] via [facility]. Information has been provided to agency. Pt agreeable to plan of care/ agency change. MD to write orders. No further needs from CM at this time.    1209 11-09-15 [place], BSN [phone] Pt was discussed in the [place] meeting today. [facility] continues to follow pt for possible IV Milrinone in the home. CM will continue to monitor for  additional disposition needs.  Bethena Roys, RN 11/05/2015, 3:49 PM

## 2015-11-06 DIAGNOSIS — N189 Chronic kidney disease, unspecified: Secondary | ICD-10-CM

## 2015-11-06 DIAGNOSIS — I2510 Atherosclerotic heart disease of native coronary artery without angina pectoris: Secondary | ICD-10-CM

## 2015-11-06 LAB — CARBOXYHEMOGLOBIN
CARBOXYHEMOGLOBIN: 2.9 % — AB (ref 0.5–1.5)
METHEMOGLOBIN: 0.8 % (ref 0.0–1.5)
O2 Saturation: 89.2 %
Total hemoglobin: 10.8 g/dL — ABNORMAL LOW (ref 13.5–18.0)

## 2015-11-06 LAB — CBC WITH DIFFERENTIAL/PLATELET
HCT: 33 % — ABNORMAL LOW (ref 39.0–52.0)
Hemoglobin: 9.9 g/dL — ABNORMAL LOW (ref 13.0–17.0)
MCH: 26.3 pg (ref 26.0–34.0)
MCHC: 30 g/dL (ref 30.0–36.0)
MCV: 87.8 fL (ref 78.0–100.0)
PLATELETS: 124 10*3/uL — AB (ref 150–400)
RBC: 3.76 MIL/uL — AB (ref 4.22–5.81)
RDW: 21 % — AB (ref 11.5–15.5)
WBC: 5.7 10*3/uL (ref 4.0–10.5)

## 2015-11-06 LAB — TYPE AND SCREEN
ABO/RH(D): B POS
ANTIBODY SCREEN: NEGATIVE
UNIT DIVISION: 0
Unit division: 0

## 2015-11-06 LAB — BASIC METABOLIC PANEL
ANION GAP: 10 (ref 5–15)
BUN: 33 mg/dL — AB (ref 6–20)
CHLORIDE: 97 mmol/L — AB (ref 101–111)
CO2: 26 mmol/L (ref 22–32)
Calcium: 9.6 mg/dL (ref 8.9–10.3)
Creatinine, Ser: 1.27 mg/dL — ABNORMAL HIGH (ref 0.61–1.24)
GFR calc Af Amer: >60 mL/min (ref >60)
GFR, EST NON AFRICAN AMERICAN: 56 mL/min — AB (ref >60)
GLUCOSE: 155 mg/dL — AB (ref 65–99)
POTASSIUM: 3.9 mmol/L (ref 3.5–5.1)
Sodium: 133 mmol/L — ABNORMAL LOW (ref 135–145)

## 2015-11-06 LAB — PROTIME-INR
INR: 1.78 — ABNORMAL HIGH (ref 0.00–1.49)
PROTHROMBIN TIME: 20.6 s — AB (ref 11.6–15.2)

## 2015-11-06 MED ORDER — WARFARIN SODIUM 5 MG PO TABS
6.0000 mg | ORAL_TABLET | Freq: Once | ORAL | Status: AC
  Administered 2015-11-06: 6 mg via ORAL
  Filled 2015-11-06: qty 1

## 2015-11-06 NOTE — Progress Notes (Signed)
Patient Name: Rick [name] Date of Encounter: 11/06/2015  Active Problems:   Acute on chronic combined systolic and diastolic CHF (congestive heart failure) ([place])   Acute on chronic combined systolic and diastolic CHF, NYHA class 3 ([place])   Length of Stay: 3  SUBJECTIVE  Able to lie comfortably flat. Excellent diuresis. Tolerated transfusion well. Last CVP 15.  CURRENT MEDS . ferrous sulfate  325 mg Oral Q breakfast  . furosemide  80 mg Intravenous TID  . isosorbide-hydrALAZINE  0.5 tablet Oral TID  . Living Better with Heart Failure Book   Does not apply Once  . potassium chloride  40 mEq Oral BID  . sodium chloride flush  10-40 mL Intracatheter Q12H  . sodium chloride flush  3 mL Intravenous Q12H  . spironolactone  12.5 mg Oral Daily  . warfarin  6 mg Oral ONCE-1800  . Warfarin - Pharmacist Dosing Inpatient   Does not apply q1800    OBJECTIVE   Intake/Output Summary (Last 24 hours) at 11/06/15 0947 Last data filed at 11/06/15 0827  Gross per 24 hour  Intake 2872.4 ml  Output   6025 ml  Net -3152.6 ml   Filed Weights   11/04/15 0437 11/05/15 0503 11/06/15 0445  Weight: 79.062 kg (174 lb 4.8 oz) 75.206 kg (165 lb 12.8 oz) 72.394 kg (159 lb 9.6 oz)    PHYSICAL EXAM Filed Vitals:   11/06/15 0138 11/06/15 0215 11/06/15 0445 11/06/15 0820  BP:  112/78 118/84 91/51  Pulse:  85 91 65  Temp:  98 F (36.7 C) 98.6 F (37 C) 97.6 F (36.4 C)  TempSrc:  Oral Oral Oral  Resp: 19 13 16    Height:      Weight:   72.394 kg (159 lb 9.6 oz)   SpO2:  100% 98% 100%   General: Alert, oriented x3, no distress Head: no evidence of trauma, PERRL, EOMI, no exophtalmos or lid lag, no myxedema, no xanthelasma; normal ears, nose and oropharynx Neck:  jugular venous pulsations to angle of jaw and no additional hepatojugular reflux; brisk carotid pulses without delay and no carotid bruits Chest: clear to auscultation, no signs of consolidation by percussion or palpation, normal  fremitus, symmetrical and full respiratory excursions. Healthy ICD site Cardiovascular: normal position and quality of the apical impulse, regular rhythm, normal first and second heart sounds, no rub, + S3 gallop, 3/6 holosystolic apical and LLSB murmur Abdomen: no tenderness or distention, no masses by palpation, no abnormal pulsatility or arterial bruits, normal bowel sounds, no hepatosplenomegaly Extremities: no clubbing, cyanosis; trace to 1+ pretibial symmetrical edema; 2+ radial, ulnar and brachial pulses bilaterally; 2+ right femoral, posterior tibial and dorsalis pedis pulses; 2+ left femoral, posterior tibial and dorsalis pedis pulses; no subclavian or femoral bruits Neurological: grossly nonfocal  LABS  CBC  Recent Labs  11/04/15 0820 11/05/15 0522 11/06/15 0530  WBC 5.0 5.7 5.7  NEUTROABS 2.9 3.6  --   HGB 7.2* 7.3* 9.9*  HCT 23.7* 24.3* 33.0*  MCV 86.8 86.5 87.8  PLT 123* 138* [id number]*   Basic Metabolic Panel  Recent Labs  11/03/15 1453  11/05/15 0522 11/06/15 0530  NA 135  < > 135 133*  K 3.8  < > 3.7 3.9  CL 103  < > 99* 97*  CO2 23  < > 25 26  GLUCOSE 102*  < > 138* 155*  BUN 49*  < > 40* 33*  CREATININE 1.81*  < > 1.39* 1.27*  CALCIUM 9.2  < >  9.3 9.6  MG 2.1  --   --   --   < > = values in this interval not displayed.   Radiology Studies Imaging results have been reviewed and No results found.  TELE NSR  ECG NSR, RBBB + LAFB and extremely broad QRS (>200 ms)  ASSESSMENT AND PLAN  1. Acute on chronic combined CHF - Echo 10/08/15 LVEF 15-20% with ICM -> cardiogenic shock; steady improvement on IV milrinone with continued improvement in renal function despite aggressive diuresis. Still grossly hypervolemic, continue diuresis. He is not candidate for advanced therapies. May need to consider home inotropes for palliation versus intermittent milrinone "holiday". Possible cath before DC per Dr. Clayborne Dana 05/31 note. 2. CAD s/p CABG - no angina 3. HTN - BP  occasionally low, no med titration performed today 4. ETOH abuse 5. Hx of CVA - on warfarin, INR dipped to 1.78, likely a sign of improved hepatic congestion 6. Anemia, iron deficient - Hgb 9.9 after transfusion, negative for blood in stool 7. 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 Review this option with his EP.   Sanda Klein, MD, [facility] [phone] office [phone] pager 11/06/2015 9:47 AM

## 2015-11-06 NOTE — Progress Notes (Signed)
ANTICOAGULATION CONSULT NOTE - Follow Up Consult  Pharmacy Consult for Coumadin Indication: pulmonary embolus history  No Known Allergies  Patient Measurements: Height: 5\' 11"  (180.3 cm) Weight: 159 lb 9.6 oz (72.394 kg) IBW/kg (Calculated) : 75.3  Vital Signs: Temp: 97.6 F (36.4 C) (06/03 0820) Temp Source: Oral (06/03 0820) BP: 91/51 mmHg (06/03 0820) Pulse Rate: 65 (06/03 0820)  Labs:  Recent Labs  11/04/15 0500 11/04/15 0820 11/05/15 0522 11/06/15 0530  HGB  --  7.2* 7.3* 9.9*  HCT  --  23.7* 24.3* 33.0*  PLT  --  123* 138* 124*  LABPROT 20.8*  --  22.5* 20.6*  INR 1.80*  --  2.00* 1.78*  CREATININE 1.54*  --  1.39* 1.27*    Estimated Creatinine Clearance: 56.2 mL/min (by C-G formula based on Cr of 1.27).   Medications:  Prescriptions prior to admission  Medication Sig Dispense Refill Last Dose  . aspirin 81 MG tablet Take 81 mg by mouth daily as needed for pain.   Past Week at Unknown time  . carvedilol (COREG) 6.25 MG tablet Take 0.5 tablets (3.125 mg total) by mouth 2 (two) times daily with a meal. 30 tablet 0 11/03/2015 at 0900  . ferrous sulfate 325 (65 FE) MG tablet Take 325 mg by mouth daily with breakfast.   11/03/2015 at Unknown time  . isosorbide-hydrALAZINE (BIDIL) 20-37.5 MG tablet Take 0.5 tablets by mouth 3 (three) times daily. 45 tablet 0 Past Week at Unknown time  . metolazone (ZAROXOLYN) 5 MG tablet Take 5 mg by mouth as needed. Patient take 2.5 mg as needed for swelling.   Past Week at Unknown time  . polyethylene glycol (MIRALAX / GLYCOLAX) packet Take 17 g by mouth daily as needed for mild constipation.    Past Month at Unknown time  . potassium chloride SA (K-DUR,KLOR-CON) 20 MEQ tablet Take 20 mEq by mouth as needed (hand cramping).   Past Month at Unknown time  . torsemide (DEMADEX) 20 MG tablet Take 3 tablets (60 mg total) by mouth 2 (two) times daily. 180 tablet 6 11/03/2015 at Unknown time  . traMADol (ULTRAM) 50 MG tablet Take by mouth  every 6 (six) hours as needed for moderate pain.    Past Month at Unknown time  . warfarin (COUMADIN) 3 MG tablet Take 2 tablets daily or as directed (Patient taking differently: Take 3-6 mg by mouth daily at 6 PM. Depending on INR) 180 tablet 3 11/02/2015 at 1800    Assessment: 70 yo M admitted with 20lb weight gain/HF exacerbation.  Pt on Coumadin PTA for hx PE.  INR 2> 1.8 fell with home dose possibly improved clearance with improved hemodynamic.   Noted Hgb continues to trend down 7.7 > 7.3.  Pt denies BRBPR.  FOBT ordered. FE studies low could be anemia chronic Dz > Fereheme x1 would repeat next week. H/h improved with PRBC 10/33 on 6/3  Pt does have ongoing small nosebleeds  Goal of Therapy:  INR 2-3 Monitor platelets by anticoagulation protocol: Yes   Plan:  Coumadin 6mg  x1 boost Daily INR Follo-up results of FOBT  Bonnita Nasuti Pharm.D. CPP, BCPS Clinical Pharmacist [phone] 11/06/2015 9:18 AM

## 2015-11-07 LAB — BASIC METABOLIC PANEL
ANION GAP: 8 (ref 5–15)
BUN: 25 mg/dL — AB (ref 6–20)
CALCIUM: 9.5 mg/dL (ref 8.9–10.3)
CO2: 28 mmol/L (ref 22–32)
Chloride: 97 mmol/L — ABNORMAL LOW (ref 101–111)
Creatinine, Ser: 1.13 mg/dL (ref 0.61–1.24)
GFR calc Af Amer: >60 mL/min (ref >60)
Glucose, Bld: 117 mg/dL — ABNORMAL HIGH (ref 65–99)
POTASSIUM: 4.1 mmol/L (ref 3.5–5.1)
SODIUM: 133 mmol/L — AB (ref 135–145)

## 2015-11-07 LAB — CARBOXYHEMOGLOBIN
CARBOXYHEMOGLOBIN: 2.6 % — AB (ref 0.5–1.5)
METHEMOGLOBIN: 0.7 % (ref 0.0–1.5)
O2 Saturation: 62 %
TOTAL HEMOGLOBIN: 10.7 g/dL — AB (ref 13.5–18.0)

## 2015-11-07 LAB — CBC WITH DIFFERENTIAL/PLATELET
BASOS PCT: 0 %
Basophils Absolute: 0 10*3/uL (ref 0.0–0.1)
EOS PCT: 1 %
Eosinophils Absolute: 0.1 10*3/uL (ref 0.0–0.7)
HEMATOCRIT: 33.1 % — AB (ref 39.0–52.0)
Hemoglobin: 10.2 g/dL — ABNORMAL LOW (ref 13.0–17.0)
Lymphocytes Relative: 27 %
Lymphs Abs: 1.9 10*3/uL (ref 0.7–4.0)
MCH: 26.3 pg (ref 26.0–34.0)
MCHC: 30.8 g/dL (ref 30.0–36.0)
MCV: 85.3 fL (ref 78.0–100.0)
MONO ABS: 1.3 10*3/uL — AB (ref 0.1–1.0)
MONOS PCT: 18 %
NEUTROS PCT: 54 %
Neutro Abs: 3.9 10*3/uL (ref 1.7–7.7)
PLATELETS: 149 10*3/uL — AB (ref 150–400)
RBC: 3.88 MIL/uL — AB (ref 4.22–5.81)
RDW: 21.2 % — ABNORMAL HIGH (ref 11.5–15.5)
WBC: 7.2 10*3/uL (ref 4.0–10.5)

## 2015-11-07 LAB — PROTIME-INR
INR: 1.91 — ABNORMAL HIGH (ref 0.00–1.49)
Prothrombin Time: 21.8 seconds — ABNORMAL HIGH (ref 11.6–15.2)

## 2015-11-07 MED ORDER — WARFARIN SODIUM 5 MG PO TABS
6.0000 mg | ORAL_TABLET | Freq: Once | ORAL | Status: AC
  Administered 2015-11-07: 6 mg via ORAL
  Filled 2015-11-07: qty 1

## 2015-11-07 MED ORDER — FUROSEMIDE 10 MG/ML IJ SOLN
80.0000 mg | Freq: Two times a day (BID) | INTRAMUSCULAR | Status: DC
  Administered 2015-11-07 – 2015-11-08 (×2): 80 mg via INTRAVENOUS
  Filled 2015-11-07 (×2): qty 8

## 2015-11-07 NOTE — Progress Notes (Signed)
Patient Name: Rick [name] Date of Encounter: 11/07/2015  Active Problems:   Acute on chronic combined systolic and diastolic CHF (congestive heart failure) ([place])   Acute on chronic combined systolic and diastolic CHF, NYHA class 3 ([place])   Length of Stay: 4  SUBJECTIVE  Prodigious diuresis. Lost 10 Kg in weight and net negative 13.7L. Creatinine has continued to improve. CVP 11. Developing a few leg muscle cramps.  CURRENT MEDS . ferrous sulfate  325 mg Oral Q breakfast  . furosemide  80 mg Intravenous TID  . isosorbide-hydrALAZINE  0.5 tablet Oral TID  . Living Better with Heart Failure Book   Does not apply Once  . potassium chloride  40 mEq Oral BID  . sodium chloride flush  10-40 mL Intracatheter Q12H  . sodium chloride flush  3 mL Intravenous Q12H  . spironolactone  12.5 mg Oral Daily  . warfarin  6 mg Oral ONCE-1800  . Warfarin - Pharmacist Dosing Inpatient   Does not apply q1800    OBJECTIVE   Intake/Output Summary (Last 24 hours) at 11/07/15 0938 Last data filed at 11/07/15 0800  Gross per 24 hour  Intake    590 ml  Output   4615 ml  Net  -4025 ml   Filed Weights   11/05/15 0503 11/06/15 0445 11/07/15 0459  Weight: 75.206 kg (165 lb 12.8 oz) 72.394 kg (159 lb 9.6 oz) 69.7 kg (153 lb 10.6 oz)    PHYSICAL EXAM Filed Vitals:   11/06/15 2150 11/06/15 2344 11/07/15 0050 11/07/15 0459  BP: 95/74  100/59 98/69  Pulse:   90 92  Temp:   98.4 F (36.9 C) 98.6 F (37 C)  TempSrc:   Oral Oral  Resp: 12 13 12 14   Height:      Weight:    69.7 kg (153 lb 10.6 oz)  SpO2:   100% 96%   General: Alert, oriented x3, no distress Head: no evidence of trauma, PERRL, EOMI, no exophtalmos or lid lag, no myxedema, no xanthelasma; normal ears, nose and oropharynx Neck: jugular venous pulsations 8 cm with additional hepatojugular reflux; brisk carotid pulses without delay and no carotid bruits Chest: clear to auscultation, no signs of consolidation by percussion or  palpation, normal fremitus, symmetrical and full respiratory excursions. Healthy ICD site Cardiovascular: normal position and quality of the apical impulse, regular rhythm, normal first and second heart sounds, no rub, + S3 gallop, 3/6 holosystolic apical and LLSB murmur Abdomen: no tenderness or distention, no masses by palpation, no abnormal pulsatility or arterial bruits, normal bowel sounds, no hepatosplenomegaly Extremities: no clubbing, cyanosis; trace pretibial symmetrical edema; 2+ radial, ulnar and brachial pulses bilaterally; 2+ right femoral, posterior tibial and dorsalis pedis pulses; 2+ left femoral, posterior tibial and dorsalis pedis pulses; no subclavian or femoral bruits Neurological: grossly nonfocal  LABS  CBC  Recent Labs  11/05/15 0522 11/06/15 0530 11/07/15 0552  WBC 5.7 5.7 7.2  NEUTROABS 3.6  --  3.9  HGB 7.3* 9.9* 10.2*  HCT 24.3* 33.0* 33.1*  MCV 86.5 87.8 85.3  PLT 138* 124* [id number]*   Basic Metabolic Panel  Recent Labs  11/06/15 0530 11/07/15 0552  NA 133* 133*  K 3.9 4.1  CL 97* 97*  CO2 26 28  GLUCOSE 155* 117*  BUN 33* 25*  CREATININE 1.27* 1.13  CALCIUM 9.6 9.5    TELE NSR, no VT   ASSESSMENT AND PLAN  1. Acute on chronic combined CHF - Echo 10/08/15 LVEF 15-20% with  ICM -> cardiogenic shock; steady improvement on IV milrinone with continued improvement in renal function despite aggressive diuresis. Still hypervolemic, but peripheral edema resolved, CVP down to 11 and starting to have some muscle cramps. Continue diuresis, reduce total diuretic dose. Probably switch to PO diuretics tomorrow. He is not candidate for advanced therapies. May need to consider home inotropes for palliation versus intermittent milrinone "holiday". Possible cath before DC, per Dr. Clayborne Dana 05/31 note. 2. CAD s/p CABG - no angina 3. HTN - BP consistently low, no room for med titrationtoday 4. ETOH abuse 5. Hx of CVA - on warfarin, INR 1.91, increased warfarin  requirements are likely a sign of improved hepatic congestion 6. 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 Review this option with his EP.   Sanda Klein, MD, [facility] [phone] office [phone] pager 11/07/2015 9:38 AM

## 2015-11-07 NOTE — Progress Notes (Signed)
ANTICOAGULATION CONSULT NOTE - Follow Up Consult  Pharmacy Consult for Coumadin Indication: pulmonary embolus history  No Known Allergies  Patient Measurements: Height: 5\' 11"  (180.3 cm) Weight: 153 lb 10.6 oz (69.7 kg) IBW/kg (Calculated) : 75.3  Vital Signs: Temp: 98.6 F (37 C) (06/04 0459) Temp Source: Oral (06/04 0459) BP: 98/69 mmHg (06/04 0459) Pulse Rate: 92 (06/04 0459)  Labs:  Recent Labs  11/05/15 0522 11/06/15 0530 11/07/15 0552  HGB 7.3* 9.9* 10.2*  HCT 24.3* 33.0* 33.1*  PLT 138* 124* 149*  LABPROT 22.5* 20.6* 21.8*  INR 2.00* 1.78* 1.91*  CREATININE 1.39* 1.27* 1.13    Estimated Creatinine Clearance: 60.8 mL/min (by C-G formula based on Cr of 1.13).   Medications:  Prescriptions prior to admission  Medication Sig Dispense Refill Last Dose  . aspirin 81 MG tablet Take 81 mg by mouth daily as needed for pain.   Past Week at Unknown time  . carvedilol (COREG) 6.25 MG tablet Take 0.5 tablets (3.125 mg total) by mouth 2 (two) times daily with a meal. 30 tablet 0 11/03/2015 at 0900  . ferrous sulfate 325 (65 FE) MG tablet Take 325 mg by mouth daily with breakfast.   11/03/2015 at Unknown time  . isosorbide-hydrALAZINE (BIDIL) 20-37.5 MG tablet Take 0.5 tablets by mouth 3 (three) times daily. 45 tablet 0 Past Week at Unknown time  . metolazone (ZAROXOLYN) 5 MG tablet Take 5 mg by mouth as needed. Patient take 2.5 mg as needed for swelling.   Past Week at Unknown time  . polyethylene glycol (MIRALAX / GLYCOLAX) packet Take 17 g by mouth daily as needed for mild constipation.    Past Month at Unknown time  . potassium chloride SA (K-DUR,KLOR-CON) 20 MEQ tablet Take 20 mEq by mouth as needed (hand cramping).   Past Month at Unknown time  . torsemide (DEMADEX) 20 MG tablet Take 3 tablets (60 mg total) by mouth 2 (two) times daily. 180 tablet 6 11/03/2015 at Unknown time  . traMADol (ULTRAM) 50 MG tablet Take by mouth every 6 (six) hours as needed for moderate pain.     Past Month at Unknown time  . warfarin (COUMADIN) 3 MG tablet Take 2 tablets daily or as directed (Patient taking differently: Take 3-6 mg by mouth daily at 6 PM. Depending on INR) 180 tablet 3 11/02/2015 at 1800    Assessment: 70 yo M admitted with 20lb weight gain/HF exacerbation.  Pt on Coumadin PTA for hx PE.  INR 2> 1.8 fell with home dose possibly improved clearance with improved hemodynamic. Now 1.9 after boost Warfarin 6mg  yesterday will repeat and probably restart home dose tomorrow    Noted Hgb continues to trend down 7.7 > 7.3.  Pt denies BRBPR.  FOBT ordered. FE studies low could be anemia chronic Dz > Fereheme x1 would repeat next week. H/h improved with PRBC 10/33 on 6/3 and holding stable  Pt does have ongoing small nosebleeds  Goal of Therapy:  INR 2-3 Monitor platelets by anticoagulation protocol: Yes   Plan:  Coumadin 6mg  x1 boost Daily INR Follo-up results of FOBT  Bonnita Nasuti Pharm.D. CPP, BCPS Clinical Pharmacist [phone] 11/07/2015 8:13 AM

## 2015-11-08 LAB — CARBOXYHEMOGLOBIN
CARBOXYHEMOGLOBIN: 2.2 % — AB (ref 0.5–1.5)
Carboxyhemoglobin: 2.6 % — ABNORMAL HIGH (ref 0.5–1.5)
METHEMOGLOBIN: 0.8 % (ref 0.0–1.5)
Methemoglobin: 0.5 % (ref 0.0–1.5)
O2 Saturation: 67.7 %
O2 Saturation: 61.8 %
TOTAL HEMOGLOBIN: 7.7 g/dL — AB (ref 13.5–18.0)
Total hemoglobin: 11.3 g/dL — ABNORMAL LOW (ref 13.5–18.0)

## 2015-11-08 LAB — CBC WITH DIFFERENTIAL/PLATELET
BASOS PCT: 0 %
Basophils Absolute: 0 10*3/uL (ref 0.0–0.1)
EOS ABS: 0.1 10*3/uL (ref 0.0–0.7)
EOS PCT: 1 %
HCT: 35.3 % — ABNORMAL LOW (ref 39.0–52.0)
Hemoglobin: 10.7 g/dL — ABNORMAL LOW (ref 13.0–17.0)
LYMPHS ABS: 2.1 10*3/uL (ref 0.7–4.0)
Lymphocytes Relative: 27 %
MCH: 26.2 pg (ref 26.0–34.0)
MCHC: 30.3 g/dL (ref 30.0–36.0)
MCV: 86.5 fL (ref 78.0–100.0)
MONO ABS: 1.4 10*3/uL — AB (ref 0.1–1.0)
Monocytes Relative: 18 %
NEUTROS PCT: 54 %
Neutro Abs: 4.1 10*3/uL (ref 1.7–7.7)
PLATELETS: 184 10*3/uL (ref 150–400)
RBC: 4.08 MIL/uL — AB (ref 4.22–5.81)
RDW: 21.2 % — AB (ref 11.5–15.5)
WBC: 7.7 10*3/uL (ref 4.0–10.5)

## 2015-11-08 LAB — PROTIME-INR
INR: 1.8 — ABNORMAL HIGH (ref 0.00–1.49)
PROTHROMBIN TIME: 20.8 s — AB (ref 11.6–15.2)

## 2015-11-08 LAB — BASIC METABOLIC PANEL
ANION GAP: 9 (ref 5–15)
BUN: 20 mg/dL (ref 6–20)
CHLORIDE: 99 mmol/L — AB (ref 101–111)
CO2: 26 mmol/L (ref 22–32)
Calcium: 9.6 mg/dL (ref 8.9–10.3)
Creatinine, Ser: 1.08 mg/dL (ref 0.61–1.24)
GFR calc Af Amer: >60 mL/min (ref >60)
GLUCOSE: 109 mg/dL — AB (ref 65–99)
POTASSIUM: 4.3 mmol/L (ref 3.5–5.1)
Sodium: 134 mmol/L — ABNORMAL LOW (ref 135–145)

## 2015-11-08 MED ORDER — FUROSEMIDE 10 MG/ML IJ SOLN
80.0000 mg | Freq: Two times a day (BID) | INTRAMUSCULAR | Status: DC
  Administered 2015-11-08 – 2015-11-09 (×2): 80 mg via INTRAVENOUS
  Filled 2015-11-08 (×2): qty 8

## 2015-11-08 MED ORDER — TORSEMIDE 20 MG PO TABS: 60.0000 mg | ORAL_TABLET | Freq: Two times a day (BID) | ORAL | Status: DC

## 2015-11-08 MED ORDER — RESOURCE THICKENUP CLEAR PO POWD
ORAL | Status: DC | PRN
  Filled 2015-11-08: qty 125

## 2015-11-08 MED ORDER — WARFARIN SODIUM 5 MG PO TABS
5.0000 mg | ORAL_TABLET | Freq: Once | ORAL | Status: AC
  Administered 2015-11-08: 5 mg via ORAL
  Filled 2015-11-08: qty 1

## 2015-11-08 MED ORDER — STARCH (THICKENING) PO POWD
ORAL | Status: DC | PRN
  Filled 2015-11-08: qty 227

## 2015-11-08 NOTE — Care Management Important Message (Signed)
Important Message  Patient Details  Name: Rick [name] MRN: [id number] Date of Birth: [birth year]   Medicare Important Message Given:  Yes    [name] May 11/08/2015, 12:08 PM

## 2015-11-08 NOTE — Progress Notes (Addendum)
Advanced Heart Failure Rounding Note  Primary Care: Wende Neighbors, MD  Primary Cardiologist: Dr Harl Bowie   Subjective:    Admitted 11/03/15 with marked volume overload, found to be in cardiogenic shock with initial coox of 41%. Started on milrinone.   Coox 67.7% this morning on mirinone 0.25. Creatinine has continued to trend down.   Feels much better. Denies DOE, lightheadedness, or dizziness.   Out 1.2 L and down 2 more lbs.  Out 15 L and  24 lbs total. Lasix decreased to BID from TID yesterday.   Objective:   Weight Range: 151 lb 14.4 oz (68.901 kg) Body mass index is 21.2 kg/(m^2).   Vital Signs:   Temp:  [98.3 F (36.8 C)-98.9 F (37.2 C)] 98.6 F (37 C) (06/05 0750) Pulse Rate:  [72-88] 88 (06/05 0750) Resp:  [12-22] 12 (06/05 0855) BP: (80-109)/(41-77) 97/71 mmHg (06/05 0855) SpO2:  [94 %-100 %] 100 % (06/05 0750) Weight:  [151 lb 14.4 oz (68.901 kg)] 151 lb 14.4 oz (68.901 kg) (06/05 0500) Last BM Date: 11/08/15  Weight change: Filed Weights   11/06/15 0445 11/07/15 0459 11/08/15 0500  Weight: 159 lb 9.6 oz (72.394 kg) 153 lb 10.6 oz (69.7 kg) 151 lb 14.4 oz (68.901 kg)    Intake/Output:   Intake/Output Summary (Last 24 hours) at 11/08/15 0924 Last data filed at 11/08/15 0600  Gross per 24 hour  Intake    666 ml  Output   1975 ml  Net  -1309 ml     Physical Exam: CVP 11 General: Elderly appearing. Fatigued appearing.  HEENT: normal Neck: supple. JVP 10-11. Carotids 2+ bilat; no bruits. No thyromegaly or nodule noted.  Cor: PMI nondisplaced. RRR. No rubs. 2/6 MR. + S3. Lungs: Slightly diminished basilar sounds.  Abdomen: soft, NT, ND, no HSM. No bruits or masses. +BS Pulsatile Liver Extremities: no cyanosis, clubbing, rash. No edema Neuro: alert & oriented x 3, cranial nerves grossly intact. moves all 4 extremities w/o difficulty. Affect pleasant.  Telemetry: NSR 70s. Occasional PVCs  Labs: CBC  Recent Labs  11/07/15 0552 11/08/15 0445  WBC  7.2 7.7  NEUTROABS 3.9 4.1  HGB 10.2* 10.7*  HCT 33.1* 35.3*  MCV 85.3 86.5  PLT 149* [id number]   Basic Metabolic Panel  Recent Labs  11/07/15 0552 11/08/15 0445  NA 133* 134*  K 4.1 4.3  CL 97* 99*  CO2 28 26  GLUCOSE 117* 109*  BUN 25* 20  CREATININE 1.13 1.08  CALCIUM 9.5 9.6   Liver Function Tests No results for input(s): AST, ALT, ALKPHOS, BILITOT, PROT, ALBUMIN in the last 72 hours. No results for input(s): LIPASE, AMYLASE in the last 72 hours. Cardiac Enzymes No results for input(s): CKTOTAL, CKMB, CKMBINDEX, TROPONINI in the last 72 hours.  BNP: BNP (last 3 results)  Recent Labs  10/07/15 1227 11/03/15 1455  BNP 2346.0* 2344.5*    ProBNP (last 3 results) No results for input(s): PROBNP in the last 8760 hours.   D-Dimer No results for input(s): DDIMER in the last 72 hours. Hemoglobin A1C No results for input(s): HGBA1C in the last 72 hours. Fasting Lipid Panel No results for input(s): CHOL, HDL, LDLCALC, TRIG, CHOLHDL, LDLDIRECT in the last 72 hours. Thyroid Function Tests No results for input(s): TSH, T4TOTAL, T3FREE, THYROIDAB in the last 72 hours.  Invalid input(s): FREET3  Other results:     Imaging/Studies:  No results found.  Latest Echo  Latest Cath   Medications:  Scheduled Medications: . ferrous sulfate  325 mg Oral Q breakfast  . furosemide  80 mg Intravenous Q12H  . isosorbide-hydrALAZINE  0.5 tablet Oral TID  . Living Better with Heart Failure Book   Does not apply Once  . potassium chloride  40 mEq Oral BID  . sodium chloride flush  10-40 mL Intracatheter Q12H  . sodium chloride flush  3 mL Intravenous Q12H  . spironolactone  12.5 mg Oral Daily  . Warfarin - Pharmacist Dosing Inpatient   Does not apply q1800    Infusions: . milrinone 0.25 mcg/kg/min (11/07/15 2122)    PRN Medications: sodium chloride, acetaminophen, ondansetron (ZOFRAN) IV, polyethylene glycol, sodium chloride flush, sodium chloride flush,  traMADol   Assessment   1. 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 CAD s/p CABG 3. HTN 4. ETOH abuse 5. Hx of CVA 6. Anemia, iron deficient  7. Hypokalemia   Plan    Volume status much improved. Though still somewhat overloaded. Will continue IV lasix today. 80 mg BID.  Coox 67.7% on milrinone 0.25 mcg/kg/min.  Iron stores markedly low 11/04/15. Got one dose Feraheme. Hgb improved after 2 UPRBCs ad continues to trend up. Anemia may be related to chronic disease with no overt bleeding.    He is overdue for colonoscopy.   Will try and wean milrinone. Decrease to 0.125 and recheck coox this afternoon.  Continue CVPs.   Will likely need RHC tomorrow.  Will discuss timing with MD.   Length of Stay: 5  Shirley Friar PA-C 11/08/2015, 9:24 AM  Advanced Heart Failure Team Pager [phone] (M-F; 7a - 4p)  Please contact [facility] for night-coverage after hours (4p -7a ) and weekends on [email or web address]   Patient seen and examined with Oda Kilts, PA-C. We discussed all aspects of the encounter. I agree with the assessment and plan as stated above.   Mr. Driskel is much improved with inotropic support. Volume status improving. Renal function now normalized. CVP checked personally and currently 11-12. Will continue IV diuresis for now. Continue milrinone until fully diuresed. Once fully diuresed will stop milrinone and consider possible left and  RHC. Discussed need for possible home inotropes.   Bensimhon, Daniel,MD 10:11 PM

## 2015-11-08 NOTE — Progress Notes (Signed)
ANTICOAGULATION CONSULT NOTE - Follow Up Consult  Pharmacy Consult for Coumadin Indication: pulmonary embolus history  No Known Allergies  Patient Measurements: Height: 5\' 11"  (180.3 cm) Weight: 151 lb 14.4 oz (68.901 kg) IBW/kg (Calculated) : 75.3  Vital Signs: Temp: 98.4 F (36.9 C) (06/05 1256) Temp Source: Oral (06/05 1256) BP: 97/71 mmHg (06/05 0855) Pulse Rate: 84 (06/05 1256)  Labs:  Recent Labs  11/06/15 0530 11/07/15 0552 11/08/15 0445  HGB 9.9* 10.2* 10.7*  HCT 33.0* 33.1* 35.3*  PLT 124* 149* 184  LABPROT 20.6* 21.8* 20.8*  INR 1.78* 1.91* 1.80*  CREATININE 1.27* 1.13 1.08    Estimated Creatinine Clearance: 62.9 mL/min (by C-G formula based on Cr of 1.08).   Medications:  Prescriptions prior to admission  Medication Sig Dispense Refill Last Dose  . aspirin 81 MG tablet Take 81 mg by mouth daily as needed for pain.   Past Week at Unknown time  . carvedilol (COREG) 6.25 MG tablet Take 0.5 tablets (3.125 mg total) by mouth 2 (two) times daily with a meal. 30 tablet 0 11/03/2015 at 0900  . ferrous sulfate 325 (65 FE) MG tablet Take 325 mg by mouth daily with breakfast.   11/03/2015 at Unknown time  . isosorbide-hydrALAZINE (BIDIL) 20-37.5 MG tablet Take 0.5 tablets by mouth 3 (three) times daily. 45 tablet 0 Past Week at Unknown time  . metolazone (ZAROXOLYN) 5 MG tablet Take 5 mg by mouth as needed. Patient take 2.5 mg as needed for swelling.   Past Week at Unknown time  . polyethylene glycol (MIRALAX / GLYCOLAX) packet Take 17 g by mouth daily as needed for mild constipation.    Past Month at Unknown time  . potassium chloride SA (K-DUR,KLOR-CON) 20 MEQ tablet Take 20 mEq by mouth as needed (hand cramping).   Past Month at Unknown time  . torsemide (DEMADEX) 20 MG tablet Take 3 tablets (60 mg total) by mouth 2 (two) times daily. 180 tablet 6 11/03/2015 at Unknown time  . traMADol (ULTRAM) 50 MG tablet Take by mouth every 6 (six) hours as needed for moderate pain.     Past Month at Unknown time  . warfarin (COUMADIN) 3 MG tablet Take 2 tablets daily or as directed (Patient taking differently: Take 3-6 mg by mouth daily at 6 PM. Depending on INR) 180 tablet 3 11/02/2015 at 1800    Assessment: 70 yo M admitted with 20lb weight gain/HF exacerbation.  Pt on Coumadin PTA for hx PE.  INR 2> 1.8 fell with home dose possibly improved clearance with improved hemodynamic. Today INR decreased slightly to 1.8 after boost Warfarin 6 mg x 2 days, will give another higher dose today and follow up INR tomorrow   CBC stable. FOBT ordered. PO intake is good H/h improved with PRBC on 6/3 and holding stable. Pt does have ongoing small nosebleeds  Goal of Therapy:  INR 2-3 Monitor platelets by anticoagulation protocol: Yes   Plan:  Coumadin 5 mg PO x1 Monitor daily INR, CBC, clinical course, s/sx of bleed, PO intake, DDI Follow-up results of FOBT   Thank you for allowing [place] to participate in this patients care. Jens Som, PharmD Pager: [phone] 11/08/2015 2:01 PM

## 2015-11-08 NOTE — Progress Notes (Signed)
Physical Therapy Treatment Patient Details Name: Rick [name] MRN: [id number] DOB: 1945/12/07 Today's Date: 11/08/2015    History of Present Illness 70 y.o. male admitted 11/03/15 with marked volume overload, found to be in cardiogenic shock with initial coox of 41%. Started on milrinone.   Pt with significant PMHx of essential HTN, ischemic cardiomyopathy, PE, DOE, CAD, tricuspid regurgitation, AICD, CHF, MI, non-alcoholic cirrhosis, stroke, and CABG.    PT Comments    Pt doing well with gait.  Need to assess with SPC before d/c vs IV pole.  Pt states he feels stronger than PTA.  He will likely not need any PT f/u at discharge as he is better than normal and baseline was mod I with SPC.    Follow Up Recommendations  No PT follow up;Supervision - Intermittent     Equipment Recommendations  None recommended by PT    Recommendations for Other Services   NA     Precautions / Restrictions Precautions Precautions: Fall Precaution Comments: mildly unsteady on his feet.     Mobility  Bed Mobility Overal bed mobility: Independent                Transfers Overall transfer level: Independent Equipment used: None                Ambulation/Gait Ambulation/Gait assistance: Supervision Ambulation Distance (Feet): 450 Feet Assistive device:  (both hands on IV pole) Gait Pattern/deviations: Step-through pattern   Gait velocity interpretation: at or above normal speed for age/gender General Gait Details: Pt with good speed with both hands on IV pole. Will need to assess with his cane before d/c from therapy.            Balance Overall balance assessment: No apparent balance deficits (not formally assessed)                                  Cognition Arousal/Alertness: Awake/alert Behavior During Therapy: WFL for tasks assessed/performed Overall Cognitive Status: Within Functional Limits for tasks assessed                              Pertinent Vitals/Pain Pain Assessment: 0-10 Pain Score: 7  Pain Location: headache Pain Descriptors / Indicators: Aching Pain Intervention(s): Limited activity within patient's tolerance;Monitored during session;Premedicated before session           PT Goals (current goals can now be found in the care plan section) Acute Rehab PT Goals Patient Stated Goal: to get better and go home soon Progress towards PT goals: Progressing toward goals    Frequency  Min 3X/week    PT Plan Discharge plan needs to be updated       End of Session   Activity Tolerance: Patient tolerated treatment well Patient left: in chair;with call bell/phone within reach;with chair alarm set     Time: [id number] PT Time Calculation (min) (ACUTE ONLY): 15 min  Charges:  $Gait Training: 8-22 mins                      Debhora Titus B. Bicknell, [place], DPT [phone]   11/08/2015, 4:36 PM

## 2015-11-09 LAB — CBC WITH DIFFERENTIAL/PLATELET
BASOS PCT: 0 %
Basophils Absolute: 0 10*3/uL (ref 0.0–0.1)
EOS ABS: 0.1 10*3/uL (ref 0.0–0.7)
EOS PCT: 1 %
HEMATOCRIT: 36.2 % — AB (ref 39.0–52.0)
HEMOGLOBIN: 11 g/dL — AB (ref 13.0–17.0)
LYMPHS PCT: 19 %
Lymphs Abs: 1.5 10*3/uL (ref 0.7–4.0)
MCH: 26.9 pg (ref 26.0–34.0)
MCHC: 30.4 g/dL (ref 30.0–36.0)
MCV: 88.5 fL (ref 78.0–100.0)
MONOS PCT: 18 %
Monocytes Absolute: 1.5 10*3/uL — ABNORMAL HIGH (ref 0.1–1.0)
NEUTROS PCT: 62 %
Neutro Abs: 5 10*3/uL (ref 1.7–7.7)
Platelets: 174 10*3/uL (ref 150–400)
RBC: 4.09 MIL/uL — ABNORMAL LOW (ref 4.22–5.81)
RDW: 21.3 % — ABNORMAL HIGH (ref 11.5–15.5)
WBC: 8.1 10*3/uL (ref 4.0–10.5)

## 2015-11-09 LAB — BASIC METABOLIC PANEL
Anion gap: 9 (ref 5–15)
BUN: 20 mg/dL (ref 6–20)
CHLORIDE: 103 mmol/L (ref 101–111)
CO2: 23 mmol/L (ref 22–32)
Calcium: 9.2 mg/dL (ref 8.9–10.3)
Creatinine, Ser: 0.98 mg/dL (ref 0.61–1.24)
GFR calc Af Amer: >60 mL/min (ref >60)
GFR calc non Af Amer: >60 mL/min (ref >60)
Glucose, Bld: 147 mg/dL — ABNORMAL HIGH (ref 65–99)
POTASSIUM: 3.8 mmol/L (ref 3.5–5.1)
SODIUM: 135 mmol/L (ref 135–145)

## 2015-11-09 LAB — CARBOXYHEMOGLOBIN
Carboxyhemoglobin: 2.5 % — ABNORMAL HIGH (ref 0.5–1.5)
Methemoglobin: 0.7 % (ref 0.0–1.5)
O2 SAT: 64.1 %
TOTAL HEMOGLOBIN: 11.7 g/dL — AB (ref 13.5–18.0)

## 2015-11-09 LAB — PROTIME-INR
INR: 2.01 — ABNORMAL HIGH (ref 0.00–1.49)
Prothrombin Time: 22.6 seconds — ABNORMAL HIGH (ref 11.6–15.2)

## 2015-11-09 MED ORDER — METOLAZONE 5 MG PO TABS
5.0000 mg | ORAL_TABLET | Freq: Once | ORAL | Status: AC
  Administered 2015-11-09: 5 mg via ORAL
  Filled 2015-11-09: qty 1

## 2015-11-09 MED ORDER — WARFARIN SODIUM 5 MG PO TABS: 5.0000 mg | ORAL_TABLET | Freq: Once | ORAL | Status: DC

## 2015-11-09 MED ORDER — POTASSIUM CHLORIDE CRYS ER 20 MEQ PO TBCR
20.0000 meq | EXTENDED_RELEASE_TABLET | Freq: Once | ORAL | Status: AC
  Administered 2015-11-09: 20 meq via ORAL
  Filled 2015-11-09: qty 1

## 2015-11-09 MED ORDER — ISOSORB DINITRATE-HYDRALAZINE 20-37.5 MG PO TABS
1.0000 | ORAL_TABLET | Freq: Three times a day (TID) | ORAL | Status: DC
  Administered 2015-11-09 – 2015-11-12 (×10): 1 via ORAL
  Filled 2015-11-09 (×10): qty 1

## 2015-11-09 MED ORDER — FUROSEMIDE 10 MG/ML IJ SOLN
80.0000 mg | Freq: Two times a day (BID) | INTRAMUSCULAR | Status: AC
  Administered 2015-11-09 – 2015-11-10 (×2): 80 mg via INTRAVENOUS
  Filled 2015-11-09 (×2): qty 8

## 2015-11-09 NOTE — Consult Note (Signed)
   [facility] Inpatient Consult   11/09/2015  Rick [name] [birth year] [id number]   [name] screened for [facility] [employer] services. Went to bedside to discuss. However, [name] was sleeping soundly. Did not want to disturb. Will come back at a later time.   Marthenia Rolling, MSN-Ed, RN,BSN [facility] Liaison [phone]

## 2015-11-09 NOTE — Progress Notes (Signed)
CARDIAC REHAB PHASE I   PRE:  Rate/Rhythm: 82 SR  BP:  Sitting: 94/69        SaO2: 98 RA  MODE:  Ambulation: 350 ft   POST:  Rate/Rhythm: 94 SR c/PVCs  BP:  Sitting: 110/80         SaO2: 100 RA  Pt c/o of ongoing headache, has been receiving pain medicine per RN. Pt ambulated 350 ft on RA, IV, gait belt, assist x1, fairly steady gait, tolerated fairly well. Pt c/o mild dizziness, VSS, denies any other complaints. Completed CHF education with pt and family at bedside. Reviewed CHF booklet and zone tool, daily weights, sodium and fluid restrictions, and phase 2 cardiac rehab. Pt is not a candidate for phase 2 cardiac rehab at this time due to home milrinone. Pt verbalized understanding, able to perform teach back, however, admits to dietary non-compliance. Pt to bed per pt request after walk, call bell within reach. Will follow as schedule permits.    [place], RN, BSN 11/09/2015 3:00 PM

## 2015-11-09 NOTE — Progress Notes (Addendum)
ANTICOAGULATION CONSULT NOTE - Follow Up Consult  Pharmacy Consult for Coumadin (hold), heparin when INR < 2 Indication: hx PE  No Known Allergies  Patient Measurements: Height: 5\' 11"  (180.3 cm) Weight: 151 lb 6.4 oz (68.675 kg) IBW/kg (Calculated) : 75.3  Vital Signs: Temp: 98.2 F (36.8 C) (06/06 0752) Temp Source: Oral (06/06 0752) BP: 105/74 mmHg (06/06 0736) Pulse Rate: 93 (06/06 0752)  Labs:  Recent Labs  11/07/15 0552 11/08/15 0445 11/09/15 0513 11/09/15 0850  HGB 10.2* 10.7* 11.0*  --   HCT 33.1* 35.3* 36.2*  --   PLT 149* 184 174  --   LABPROT 21.8* 20.8* 22.6*  --   INR 1.91* 1.80* 2.01*  --   CREATININE 1.13 1.08  --  0.98    Estimated Creatinine Clearance: 69.1 mL/min (by C-G formula based on Cr of 0.98).  Assessment: 69yom continues on coumadin for hx PE. INR therapeutic at 2.01. He has received several days of higher doses - will continue higher dose tonight as last time we tried to get him back on his home regimen of 3mg  his INR dropped. CBC stable. No bleeding.  Goal of Therapy:  INR 2-3 Monitor platelets by anticoagulation protocol: Yes   Plan:  1) Coumadin 5mg  x 1 2) Daily INR  Deboraha Sprang 11/09/2015,11:35 AM  Addendum: Asked by HF team to hold Coumadin for possible cath on Thursday.  Heparin to start when INR < 2.  Will recheck INR in AM.  Uvaldo Rising, BCPS  Clinical Pharmacist Pager [phone]  11/09/2015 2:14 PM

## 2015-11-09 NOTE — Progress Notes (Signed)
Advanced Heart Failure Rounding Note  Primary Care: Wende Neighbors, MD  Primary Cardiologist: Dr Harl Bowie   Subjective:    Admitted 11/03/15 with marked volume overload, found to be in cardiogenic shock with initial coox of 41%. Started on milrinone.   Coox 64.1% this morning on mirinone 0.125. Creatinine continues to improve.   Diuresis stalled slightly yesterday.    States he continues to feel better, though has a bit of a headache this am. Working with PT. No lightheadedness or dizziness.   Out 800 cc, weight stable. On 80 mg IV lasix BID yesterday. Out  16 L and 24 lbs total.   Objective:   Weight Range: 151 lb 6.4 oz (68.675 kg) Body mass index is 21.13 kg/(m^2).   Vital Signs:   Temp:  [98 F (36.7 C)-99.1 F (37.3 C)] 98.2 F (36.8 C) (06/06 0752) Pulse Rate:  [84-93] 93 (06/06 0752) Resp:  [11-20] 13 (06/06 0735) BP: (94-107)/(61-72) 103/71 mmHg (06/06 0400) SpO2:  [100 %] 100 % (06/06 0752) Weight:  [151 lb 6.4 oz (68.675 kg)] 151 lb 6.4 oz (68.675 kg) (06/06 0500) Last BM Date: 11/08/15  Weight change: Filed Weights   11/07/15 0459 11/08/15 0500 11/09/15 0500  Weight: 153 lb 10.6 oz (69.7 kg) 151 lb 14.4 oz (68.901 kg) 151 lb 6.4 oz (68.675 kg)    Intake/Output:   Intake/Output Summary (Last 24 hours) at 11/09/15 0818 Last data filed at 11/09/15 0700  Gross per 24 hour  Intake 1443.1 ml  Output   2075 ml  Net -631.9 ml     Physical Exam: CVP 11-12 General: Elderly appearing. Fatigued appearing.  HEENT: normal Neck: supple. JVP 11-12. Carotids 2+ bilat; no bruits. No thyromegaly or nodule noted.  Cor: PMI nondisplaced. RRR. No rubs. 2/6 MR. + S3. Lungs: Slightly diminished basilar sounds, scant basilar crackles.  Abdomen: soft, NT, ND, no HSM. No bruits or masses. +BS Pulsatile Liver Extremities: no cyanosis, clubbing, rash. Minimal ankle edema Neuro: alert & oriented x 3, cranial nerves grossly intact. moves all 4 extremities w/o difficulty. Affect  pleasant.  Telemetry: Reviewed, NSR 70-80s. Occasional PVCs  Labs: CBC  Recent Labs  11/08/15 0445 11/09/15 0513  WBC 7.7 8.1  NEUTROABS 4.1 5.0  HGB 10.7* 11.0*  HCT 35.3* 36.2*  MCV 86.5 88.5  PLT 184 [id number]   Basic Metabolic Panel  Recent Labs  11/07/15 0552 11/08/15 0445  NA 133* 134*  K 4.1 4.3  CL 97* 99*  CO2 28 26  GLUCOSE 117* 109*  BUN 25* 20  CREATININE 1.13 1.08  CALCIUM 9.5 9.6   Liver Function Tests No results for input(s): AST, ALT, ALKPHOS, BILITOT, PROT, ALBUMIN in the last 72 hours. No results for input(s): LIPASE, AMYLASE in the last 72 hours. Cardiac Enzymes No results for input(s): CKTOTAL, CKMB, CKMBINDEX, TROPONINI in the last 72 hours.  BNP: BNP (last 3 results)  Recent Labs  10/07/15 1227 11/03/15 1455  BNP 2346.0* 2344.5*    ProBNP (last 3 results) No results for input(s): PROBNP in the last 8760 hours.   D-Dimer No results for input(s): DDIMER in the last 72 hours. Hemoglobin A1C No results for input(s): HGBA1C in the last 72 hours. Fasting Lipid Panel No results for input(s): CHOL, HDL, LDLCALC, TRIG, CHOLHDL, LDLDIRECT in the last 72 hours. Thyroid Function Tests No results for input(s): TSH, T4TOTAL, T3FREE, THYROIDAB in the last 72 hours.  Invalid input(s): FREET3  Other results:     Imaging/Studies:  No  results found.  Latest Echo  Latest Cath   Medications:     Scheduled Medications: . ferrous sulfate  325 mg Oral Q breakfast  . furosemide  80 mg Intravenous BID  . isosorbide-hydrALAZINE  0.5 tablet Oral TID  . Living Better with Heart Failure Book   Does not apply Once  . potassium chloride  40 mEq Oral BID  . sodium chloride flush  10-40 mL Intracatheter Q12H  . sodium chloride flush  3 mL Intravenous Q12H  . spironolactone  12.5 mg Oral Daily  . Warfarin - Pharmacist Dosing Inpatient   Does not apply q1800    Infusions: . milrinone 0.125 mcg/kg/min (11/08/15 1449)    PRN  Medications: sodium chloride, acetaminophen, ondansetron (ZOFRAN) IV, polyethylene glycol, RESOURCE THICKENUP CLEAR, sodium chloride flush, sodium chloride flush, traMADol   Assessment   1. 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 CAD s/p CABG 3. HTN 4. ETOH abuse 5. Hx of CVA 6. Anemia, iron deficient  7. Hypokalemia   Plan    Volume status improved though CVP remains elevated..  Coox 64.1% on milrinone 0.125 mcg/kg/min.   Will leave milrinone at 0.125 with continued diuresis.  Will add 5 mg metolazone to afternoon dose of lasix, and will move to 1400 for pt comfort.  Will give additional K as well.   Iron stores markedly low 11/04/15. Got one dose Feraheme. Hgb improved after 2 UPRBCs ad continues to trend up. Anemia may be related to chronic disease with no overt bleeding.    Increase Bidil to 1 tab TID.  Do not hold for pressures > 90.    He is overdue for colonoscopy.   Length of Stay: 6  Shirley Friar PA-C 11/09/2015, 8:18 AM  Advanced Heart Failure Team Pager [phone] (M-F; 7a - 4p)  Please contact [facility] for night-coverage after hours (4p -7a ) and weekends on [email or web address]  Patient seen and examined with Oda Kilts, PA-C. We discussed all aspects of the encounter. I agree with the assessment and plan as stated above.   Volume status improved but CVP still elevated. Continue IV lasix and milrinone. Add one dose metolazone. Possible cath on Thursday off milrinone.   Tauheed Mcfayden,MD 2:54 PM

## 2015-11-10 ENCOUNTER — Encounter (HOSPITAL_COMMUNITY): Payer: Medicare Other | Admitting: Internal Medicine

## 2015-11-10 DIAGNOSIS — I5022 Chronic systolic (congestive) heart failure: Secondary | ICD-10-CM | POA: Diagnosis not present

## 2015-11-10 DIAGNOSIS — Z95810 Presence of automatic (implantable) cardiac defibrillator: Secondary | ICD-10-CM

## 2015-11-10 LAB — CUP PACEART REMOTE DEVICE CHECK
Battery Remaining Percentage: 69 %
Battery Voltage: 2.98 V
Brady Statistic RV Percent Paced: 1 %
HighPow Impedance: 36 Ohm
Implantable Lead Location: 753860
Lead Channel Pacing Threshold Amplitude: 0.75 V
Lead Channel Sensing Intrinsic Amplitude: 11.7 mV
Lead Channel Setting Pacing Amplitude: 2.5 V
MDC IDC LEAD IMPLANT DT: 20080606
MDC IDC MSMT BATTERY REMAINING LONGEVITY: 76 mo
MDC IDC MSMT LEADCHNL RV IMPEDANCE VALUE: 560 Ohm
MDC IDC MSMT LEADCHNL RV PACING THRESHOLD PULSEWIDTH: 0.5 ms
MDC IDC PG SERIAL: 7025822
MDC IDC SESS DTM: 20170328083317
MDC IDC SET LEADCHNL RV PACING PULSEWIDTH: 0.5 ms
MDC IDC SET LEADCHNL RV SENSING SENSITIVITY: 0.5 mV

## 2015-11-10 LAB — BASIC METABOLIC PANEL
Anion gap: 8 (ref 5–15)
BUN: 22 mg/dL — AB (ref 6–20)
CHLORIDE: 102 mmol/L (ref 101–111)
CO2: 25 mmol/L (ref 22–32)
CREATININE: 1.05 mg/dL (ref 0.61–1.24)
Calcium: 9.5 mg/dL (ref 8.9–10.3)
GFR calc Af Amer: >60 mL/min (ref >60)
GFR calc non Af Amer: >60 mL/min (ref >60)
GLUCOSE: 133 mg/dL — AB (ref 65–99)
Potassium: 4.7 mmol/L (ref 3.5–5.1)
Sodium: 135 mmol/L (ref 135–145)

## 2015-11-10 LAB — CARBOXYHEMOGLOBIN
CARBOXYHEMOGLOBIN: 2.8 % — AB (ref 0.5–1.5)
CARBOXYHEMOGLOBIN: 2 % — AB (ref 0.5–1.5)
METHEMOGLOBIN: 0.8 % (ref 0.0–1.5)
Methemoglobin: 0.8 % (ref 0.0–1.5)
O2 SAT: 65.6 %
O2 Saturation: 73.8 %
TOTAL HEMOGLOBIN: 11.2 g/dL — AB (ref 13.5–18.0)
Total hemoglobin: 11.8 g/dL — ABNORMAL LOW (ref 13.5–18.0)

## 2015-11-10 LAB — HEPARIN LEVEL (UNFRACTIONATED)
Heparin Unfractionated: 1.1 IU/mL — ABNORMAL HIGH (ref 0.30–0.70)
Heparin Unfractionated: <0.1 IU/mL — ABNORMAL LOW (ref 0.30–0.70)

## 2015-11-10 LAB — PROTIME-INR
INR: 1.97 — ABNORMAL HIGH (ref 0.00–1.49)
PROTHROMBIN TIME: 22.3 s — AB (ref 11.6–15.2)

## 2015-11-10 MED ORDER — ASPIRIN 81 MG PO CHEW
81.0000 mg | CHEWABLE_TABLET | ORAL | Status: AC
  Administered 2015-11-11: 81 mg via ORAL
  Filled 2015-11-10: qty 1

## 2015-11-10 MED ORDER — SODIUM CHLORIDE 0.9 % IV SOLN
INTRAVENOUS | Status: DC
  Administered 2015-11-11: 10 mL/h via INTRAVENOUS

## 2015-11-10 MED ORDER — ALTEPLASE 2 MG IJ SOLR
2.0000 mg | Freq: Once | INTRAMUSCULAR | Status: AC
  Administered 2015-11-10: 2 mg
  Filled 2015-11-10: qty 2

## 2015-11-10 MED ORDER — SODIUM CHLORIDE 0.9% FLUSH
3.0000 mL | Freq: Two times a day (BID) | INTRAVENOUS | Status: DC
  Administered 2015-11-10: 3 mL via INTRAVENOUS

## 2015-11-10 MED ORDER — ALTEPLASE 2 MG IJ SOLR
2.0000 mg | Freq: Once | INTRAMUSCULAR | Status: AC
  Administered 2015-11-10: 2 mg

## 2015-11-10 MED ORDER — SODIUM CHLORIDE 0.9 % IV SOLN: 250.0000 mL | INTRAVENOUS | Status: DC | PRN

## 2015-11-10 MED ORDER — HEPARIN (PORCINE) IN NACL 100-0.45 UNIT/ML-% IJ SOLN
1200.0000 [IU]/h | INTRAMUSCULAR | Status: DC
  Administered 2015-11-10: 950 [IU]/h via INTRAVENOUS
  Administered 2015-11-11: 1200 [IU]/h via INTRAVENOUS
  Filled 2015-11-10 (×2): qty 250

## 2015-11-10 MED ORDER — SODIUM CHLORIDE 0.9 % IV BOLUS (SEPSIS)
250.0000 mL | Freq: Once | INTRAVENOUS | Status: AC
  Administered 2015-11-10: 250 mL via INTRAVENOUS

## 2015-11-10 MED ORDER — SODIUM CHLORIDE 0.9% FLUSH: 3.0000 mL | INTRAVENOUS | Status: DC | PRN

## 2015-11-10 NOTE — Progress Notes (Signed)
ANTICOAGULATION CONSULT NOTE - Follow Up Consult  Pharmacy Consult for Heparin Indication: h/o PE  No Known Allergies  Patient Measurements: Height: 5\' 11"  (180.3 cm) Weight: 148 lb 3.2 oz (67.223 kg) IBW/kg (Calculated) : 75.3 Heparin Dosing Weight:  67.2 kg  Vital Signs: Temp: 97.7 F (36.5 C) (06/07 1230) Temp Source: Oral (06/07 1230) BP: 99/62 mmHg (06/07 1230) Pulse Rate: 92 (06/07 1230)  Labs:  Recent Labs  11/08/15 0445 11/09/15 0513 11/09/15 0850 11/10/15 0456 11/10/15 1630 11/10/15 1814  HGB 10.7* 11.0*  --   --   --   --   HCT 35.3* 36.2*  --   --   --   --   PLT 184 174  --   --   --   --   LABPROT 20.8* 22.6*  --  22.3*  --   --   INR 1.80* 2.01*  --  1.97*  --   --   HEPARINUNFRC  --   --   --   --  1.10* <0.10*  CREATININE 1.08  --  0.98 1.05  --   --     Estimated Creatinine Clearance: 63.1 mL/min (by C-G formula based on Cr of 1.05).  Anticoagulation: Coumadin PTA 3mg  qd  for hx PE, INR 1.8 > 2 > 1.97 - will start IV heparin while INR < 2.  Holding Coumadin for cath Thursday (possibly) -2uts PRBC 6/2. Hgb improved to 11. Plts 174 ok - PM: HL 1.1 (RN drew from PICC near heparin infusion), repeat < 0.1  Goal of Therapy:  Heparin level 0.3-0.7 units/ml Monitor platelets by anticoagulation protocol: Yes   Plan:  Will not bolus with previous INR 1.97 Increase heparin infusion to 1050 units/hr HL and CBC in AM.   Candia Kingsbury S. Alford Highland, PharmD, BCPS Clinical Staff Pharmacist Pager [phone]  Eilene Ghazi Stillinger 11/10/2015,7:58 PM

## 2015-11-10 NOTE — Discharge Summary (Signed)
Advanced Heart Failure Discharge Note   Discharge Summary   Patient ID: Rick [name] MRN: [id number], DOB/AGE: 01-29-1946 70 y.o. Admit date: 11/03/2015 D/C date:  11/12/2015    Primary Discharge Diagnoses:  1. 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 CAD s/p CABG 3. HTN 4. ETOH abuse 5. Hx of CVA/PE - on chronic coag with warfarin.  6. Anemia, iron deficient  7. Hypokalemia  Hospital Course:   Rick [name] is a 70 y.o. male with history of ischemic cardiomyopathy status post CABG in 2003 as well as previous stenting. Most recent 2-D echo in 2016 EF 25% with restrictive diastolic dysfunction, moderate RV dysfunction PAS P 64, status post ICD who was admitted from HF clinic 11/03/15 with marked volume overload and NYHA class IIIb symptoms.   PICC line placed for CVP and Coox, with initial mixed venous stat markedly low at 41%.  Pt started on milrinone 0.25 mcg/kg/min with coox up to 65%. Initial CVP 19-20.  Pt symptoms improved rapidly on milrinone. Diuretics increased up to Lasix 80 mg q 8 hrs with metolazone.   Hgb noted to be low on admission at 7.7. Iron stores markedly low as well on 11/04/15. Got one dose Feraheme. Hgb improved after 2 UPRBCs ad continued to trend up. Anemia thought to likely be related to chronic disease with no overt bleeding, though patient is overdue for colonoscopy.  Will need GI follow up on discharge.   Pt continued to diurese well and was noted to be down over 20 lbs. On 11/10/15 he was thought to be well diuresed and tolerated wean of milrinone.   RHC performed 11/11/15 to measure baseline after diuresis and weaning of milrinone. This showed stable cardiac output and low filling pressures consistent with his diuresis.  He was stable on morning of discharge, and in discussing with patient he would prefer to attempt to be at home without milrinone for the time being.  HF team agrees, despite marginal coox, though we  expect he will need in the coming weeks to months.  Pt understands and still wants to try without at first. Will order [facility] to follow with RN at home (Lives in [place], local [facility] have been contacted and our aware and agreeable to eventually take pt on milrinone) No PT follow up recommended by in house PT.   INR 1.7 on day of discharge. No bridging felt to be needed with distant history of PE and CVA.  Will dose per pharmacy over weekend and have close follow up with [facility] coumadin clinic next week.   Overall the patient diuresed 17.1 and down 25 lbs.  He will be discharged to home in stable condition with close follow up as below.    Hold BB for now with recent low output.   Discharge Weight Range: 148 lbs Discharge Vitals: Blood pressure 105/83, pulse 81, temperature 98.2 F (36.8 C), temperature source Oral, resp. rate 26, height 5\' 11"  (1.803 m), weight 150 lb 4.8 oz (68.176 kg), SpO2 100 %.  Labs: Lab Results  Component Value Date   WBC 5.4 11/12/2015   HGB 10.6* 11/12/2015   HCT 34.8* 11/12/2015   MCV 88.3 11/12/2015   PLT 186 11/12/2015     Recent Labs Lab 11/12/15 0500  NA 133*  K 4.2  CL 100*  CO2 24  BUN 20  CREATININE 1.03  CALCIUM 9.3  GLUCOSE 161*   No results found for: CHOL,  HDL, LDLCALC, TRIG BNP (last 3 results)  Recent Labs  10/07/15 1227 11/03/15 1455  BNP 2346.0* 2344.5*    ProBNP (last 3 results) No results for input(s): PROBNP in the last 8760 hours.   Diagnostic Studies/Procedures   [facility] 11/11/15 RA = 1 RV = 24/1 PA = 26/11 (17) PCW = 9 Fick cardiac output/index = 5.5/2.9 PVR = 1.5 WU FA sat = 96% PA sat = 63%, 66% Ao = 93/59 (71) LV = 86/3/18  Assessment: 1. Severe 3v native CAD  --LAD & RCA occluded ostially  --LCX system and OMs with severe disease. Receive collaterals from ramus 2. LIMA to LAD occluded at anastomosis to LAD 3. SVG to RCA patent with severe disease in distal RCA after insertion 4. SVG x 2 unable to  cannulate or visualize on root shot suspect occluded  Discharge Medications     Medication List    STOP taking these medications        carvedilol 6.25 MG tablet  Commonly known as:  COREG      TAKE these medications        aspirin 81 MG tablet  Take 81 mg by mouth daily as needed for pain.     ferrous sulfate 325 (65 FE) MG tablet  Take 325 mg by mouth daily with breakfast.     isosorbide-hydrALAZINE 20-37.5 MG tablet  Commonly known as:  BIDIL  Take 1 tablet by mouth 3 (three) times daily.     metolazone 5 MG tablet  Commonly known as:  ZAROXOLYN  Take 0.5 tablets (2.5 mg total) by mouth as needed. For weight gain of 3 lbs overnight or 5 lbs within one week.     polyethylene glycol packet  Commonly known as:  MIRALAX / GLYCOLAX  Take 17 g by mouth daily as needed for mild constipation.     potassium chloride SA 20 MEQ tablet  Commonly known as:  K-DUR,KLOR-CON  Take 20 mEq by mouth as needed (hand cramping).     spironolactone 25 MG tablet  Commonly known as:  ALDACTONE  Take 0.5 tablets (12.5 mg total) by mouth daily.     torsemide 20 MG tablet  Commonly known as:  DEMADEX  Take 2 tablets (40 mg total) by mouth 2 (two) times daily.     traMADol 50 MG tablet  Commonly known as:  ULTRAM  Take by mouth every 6 (six) hours as needed for moderate pain.     warfarin 3 MG tablet  Commonly known as:  COUMADIN  Take 2 tablets (6 mg) 11/12/15 and 11/13/15, and then resume 1 tablet (3 mg) daily until next coumadin check        Disposition   The patient will be discharged in stable condition to home. Discharge Instructions    Diet - low sodium heart healthy    Complete by:  As directed      Heart Failure patients record your daily weight using the same scale at the same time of day    Complete by:  As directed      Increase activity slowly    Complete by:  As directed           Follow-up Information    Follow up with Glori Bickers, MD On 11/23/2015.    Specialty:  Cardiology   Why:  at 11:30 [place] information:   [address] [place] [address] [phone]  Follow up with [facility] On 11/16/2015.   Specialty:  Cardiology   Why:  at 0930 for coumadin check.    Contact information:   [place] [phone]        Duration of Discharge Encounter: Greater than 35 minutes   Signed, Annamaria Helling 11/12/2015, 9:46 AM  Patient seen and examined with Oda Kilts, PA-C. We discussed all aspects of the encounter. I agree with the assessment and plan as stated above.   He is improved after 27 pound diuresis. Required inotropic support in house but milrinone able to be weaned off for now. Will follow closely. He has severe CAD with no viable revascularization options. Will follow closely in HF Clinic.   Bensimhon, Daniel,MD 10:58 PM

## 2015-11-10 NOTE — Progress Notes (Signed)
RN called to Room pt. Pt feeling dizzy. Upon assessment pt BP 84/64. RN will notify MD. Pt also just received evening medications

## 2015-11-10 NOTE — Progress Notes (Signed)
Physical Therapy Treatment Patient Details Name: Rick [name] MRN: [id number] DOB: Nov 09, 1945 Today's Date: November 14, 2015    History of Present Illness 70 y.o. male admitted 11/03/15 with marked volume overload, found to be in cardiogenic shock with initial coox of 41%. Started on milrinone.   Pt with significant PMHx of essential HTN, ischemic cardiomyopathy, PE, DOE, CAD, tricuspid regurgitation, AICD, CHF, MI, non-alcoholic cirrhosis, stroke, and CABG.  n   PT Comments    Patient progressing with ambulation with his ganand balance activities. Will continue to benefit from skilled PT in the acute setting to allow return home with family.  No current follow up recommendations.   Follow Up Recommendations  No PT follow up;Supervision - Intermittent     Equipment Recommendations  None recommended by PT    Recommendations for Other Services       Precautions / Restrictions Precautions Precautions: Fall    Mobility  Bed Mobility Overal bed mobility: Modified Independent             General bed mobility comments: for sit to supine  Transfers Overall transfer level: Independent Equipment used: None                Ambulation/Gait Ambulation/Gait assistance: Supervision Ambulation Distance (Feet): 450 Feet Assistive device: Straight cane (with tripod tip) Gait Pattern/deviations: Step-through pattern;WFL(Within Functional Limits);Decreased stride length     General Gait Details: able to demonstrate gait with cane no LOB, maneuvering around obstacles in room with safe technique   Stairs            Wheelchair Mobility    Modified Rankin (Stroke Patients Only)       Balance             Standing balance-Leahy Scale: Good                 High Level Balance Comments: side steps at rail, heel walking, toe walking and forwards and back marching with one UE support.on rail except side stepping no UE support all with minguard A    Cognition  Arousal/Alertness: Awake/alert Behavior During Therapy: WFL for tasks assessed/performed Overall Cognitive Status: Within Functional Limits for tasks assessed                      Exercises      General Comments        Pertinent Vitals/Pain Pain Assessment: Faces Faces Pain Scale: Hurts little more Pain Location: headache Pain Descriptors / Indicators: Headache Pain Intervention(s): Monitored during session    Home Living                      Prior Function            PT Goals (current goals can now be found in the care plan section) Progress towards PT goals: Progressing toward goals    Frequency  Min 3X/week    PT Plan Current plan remains appropriate    Co-evaluation             End of Session Equipment Utilized During Treatment: Gait belt Activity Tolerance: Patient tolerated treatment well Patient left: in bed     Time: 1348-1405 PT Time Calculation (min) (ACUTE ONLY): 17 min  Charges:  $Gait Training: 8-22 mins                    G Codes:      Reginia Naas 2015-11-14, 2:28 PM  Magda Kiel, PT  [phone] 11/10/2015    

## 2015-11-10 NOTE — Progress Notes (Signed)
ANTICOAGULATION CONSULT NOTE - Follow Up Consult  Pharmacy Consult for Coumadin (hold), heparin  Indication: hx PE  No Known Allergies  Patient Measurements: Height: 5\' 11"  (180.3 cm) Weight: 148 lb 3.2 oz (67.223 kg) IBW/kg (Calculated) : 75.3  Vital Signs: Temp: 97.7 F (36.5 C) (06/07 1230) Temp Source: Oral (06/07 1230) BP: 99/62 mmHg (06/07 1230) Pulse Rate: 92 (06/07 1230)  Labs:  Recent Labs  11/08/15 0445 11/09/15 0513 11/09/15 0850 11/10/15 0456  HGB 10.7* 11.0*  --   --   HCT 35.3* 36.2*  --   --   PLT 184 174  --   --   LABPROT 20.8* 22.6*  --  22.3*  INR 1.80* 2.01*  --  1.97*  CREATININE 1.08  --  0.98 1.05    Estimated Creatinine Clearance: 63.1 mL/min (by C-G formula based on Cr of 1.05).   Derrill Memo ON 11/11/2015] sodium chloride    . heparin 950 Units/hr (11/10/15 1035)    Assessment: 69yom continues on coumadin for hx PE. INR therapeutic at 2.01. He has received several days of higher doses - will continue higher dose tonight as last time we tried to get him back on his home regimen of 3mg  his INR dropped. CBC stable. No bleeding.  Heparin drip started at 950 units/hr this AM since INR now 1.97.  Heparin level due at 2 pm.  Goal of Therapy:  INR 2-3 Monitor platelets by anticoagulation protocol: Yes   Plan:  1) Hold Coumadin tonight. 2) Heparin 950 units/hr, no bolus/ 3) Check heparin level 6 hrs after gtt started. 4)Daily heparin level and CBC 5) F/u plans to resume Coumadin after cath.  Uvaldo Rising, BCPS  Clinical Pharmacist Pager [phone]  11/10/2015 1:24 PM

## 2015-11-10 NOTE — Progress Notes (Signed)
Pt will need peripheral lab draws for heparin levels.

## 2015-11-10 NOTE — Progress Notes (Signed)
Advanced Heart Failure Rounding Note  Primary Care: Wende Neighbors, MD  Primary Cardiologist: Dr Harl Bowie   Subjective:    Admitted 11/03/15 with marked volume overload, found to be in cardiogenic shock with initial coox of 41%. Started on milrinone.   Coox 73.8% this morning on mirinone 0.125. Creatinine stable.   No CVP with clogged PICC, getting TPA.   Feels much better.  Walked halls without SOB. Did have mild dizziness. Urine remains clear and creatinine/BUN stable.   Out 1.1 L and down 3 lbs with 80 mg IV lasix BID and dose of metolazone yesterday. Out  16 L and 26 lbs total.   Objective:   Weight Range: 148 lb 3.2 oz (67.223 kg) Body mass index is 20.68 kg/(m^2).   Vital Signs:   Temp:  [98.1 F (36.7 C)-98.7 F (37.1 C)] 98.7 F (37.1 C) (06/07 0434) Pulse Rate:  [82-100] 89 (06/07 0434) Resp:  [12-21] 21 (06/07 0434) BP: (91-104)/(58-75) 91/63 mmHg (06/07 0434) SpO2:  [99 %-100 %] 99 % (06/07 0434) Weight:  [148 lb 3.2 oz (67.223 kg)] 148 lb 3.2 oz (67.223 kg) (06/07 0500) Last BM Date: 11/08/15  Weight change: Filed Weights   11/08/15 0500 11/09/15 0500 11/10/15 0500  Weight: 151 lb 14.4 oz (68.901 kg) 151 lb 6.4 oz (68.675 kg) 148 lb 3.2 oz (67.223 kg)    Intake/Output:   Intake/Output Summary (Last 24 hours) at 11/10/15 0820 Last data filed at 11/10/15 0600  Gross per 24 hour  Intake  670.1 ml  Output   1650 ml  Net -979.9 ml     Physical Exam: CVP = PICC clogged, getting TPA.  General: Elderly appearing. Fatigued appearing.  HEENT: normal Neck: supple. JVP 11-12. Carotids 2+ bilat; no bruits. No thyromegaly or nodule noted.  Cor: PMI nondisplaced. RRR. No rubs. 2/6 MR. + S3. Lungs: Slightly diminished basilar sounds, scant basilar crackles.  Abdomen: soft, NT, ND, no HSM. No bruits or masses. +BS Pulsatile Liver Extremities: no cyanosis, clubbing, rash. Minimal ankle edema Neuro: alert & oriented x 3, cranial nerves grossly intact. moves all 4  extremities w/o difficulty. Affect pleasant.  Telemetry: Reviewed, NSR 70-80s. Occasional PVCs  Labs: CBC  Recent Labs  11/08/15 0445 11/09/15 0513  WBC 7.7 8.1  NEUTROABS 4.1 5.0  HGB 10.7* 11.0*  HCT 35.3* 36.2*  MCV 86.5 88.5  PLT 184 [id number]   Basic Metabolic Panel  Recent Labs  11/09/15 0850 11/10/15 0456  NA 135 135  K 3.8 4.7  CL 103 102  CO2 23 25  GLUCOSE 147* 133*  BUN 20 22*  CREATININE 0.98 1.05  CALCIUM 9.2 9.5   Liver Function Tests No results for input(s): AST, ALT, ALKPHOS, BILITOT, PROT, ALBUMIN in the last 72 hours. No results for input(s): LIPASE, AMYLASE in the last 72 hours. Cardiac Enzymes No results for input(s): CKTOTAL, CKMB, CKMBINDEX, TROPONINI in the last 72 hours.  BNP: BNP (last 3 results)  Recent Labs  10/07/15 1227 11/03/15 1455  BNP 2346.0* 2344.5*    ProBNP (last 3 results) No results for input(s): PROBNP in the last 8760 hours.   D-Dimer No results for input(s): DDIMER in the last 72 hours. Hemoglobin A1C No results for input(s): HGBA1C in the last 72 hours. Fasting Lipid Panel No results for input(s): CHOL, HDL, LDLCALC, TRIG, CHOLHDL, LDLDIRECT in the last 72 hours. Thyroid Function Tests No results for input(s): TSH, T4TOTAL, T3FREE, THYROIDAB in the last 72 hours.  Invalid input(s): FREET3  Other results:     Imaging/Studies:  No results found.  Latest Echo  Latest Cath   Medications:     Scheduled Medications: . ferrous sulfate  325 mg Oral Q breakfast  . furosemide  80 mg Intravenous BID  . isosorbide-hydrALAZINE  1 tablet Oral TID  . Living Better with Heart Failure Book   Does not apply Once  . sodium chloride flush  10-40 mL Intracatheter Q12H  . sodium chloride flush  3 mL Intravenous Q12H  . spironolactone  12.5 mg Oral Daily    Infusions: . heparin    . milrinone 0.125 mcg/kg/min (11/09/15 1900)    PRN Medications: sodium chloride, acetaminophen, ondansetron (ZOFRAN) IV,  polyethylene glycol, RESOURCE THICKENUP CLEAR, sodium chloride flush, sodium chloride flush, traMADol   Assessment   1. 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 CAD s/p CABG 3. HTN 4. ETOH abuse 5. Hx of CVA 6. Anemia, iron deficient  7. Hypokalemia   Plan    Volume status continues to improve. No CVP with clogged PICC. Getting TPA. Coox 73.8% on milrinone 0.125 mcg/kg/min.   Will stop milrinone and recheck coox this afternoon.  Give dose of IV lasix this am and will hold evening dose.  Will further evaluation with [facility] in am.    Iron stores markedly low 11/04/15. Got one dose Feraheme. Hgb improved after 2 UPRBCs ad continues to trend up. Anemia may be related to chronic disease with no overt bleeding.    Continue Bidil to 1 tab TID.  Do not hold for pressures > 90.  Lowest SBP 91 so far.   He is overdue for colonoscopy.   Length of Stay: 7  Shirley Friar PA-C 11/10/2015, 8:20 AM  Advanced Heart Failure Team Pager [phone] (M-F; 7a - 4p)  Please contact [facility] for night-coverage after hours (4p -7a ) and weekends on [email or web address]  Patient seen and examined with Oda Kilts, PA-C. We discussed all aspects of the encounter. I agree with the assessment and plan as stated above.   He has been well diuresed. Co-ox ok. Will stop milrinone. Renal function improved. Will hold inotropes and diuretics and plan R/L cath in am. Hgb ok.   Meral Geissinger,MD 6:50 PM

## 2015-11-10 NOTE — Progress Notes (Signed)
Report received via Marya Amsler RN in patient's room using SBAR format, reviewed orders, labs, VS, meds and patient's general condition, assumed care of patient.

## 2015-11-10 NOTE — Progress Notes (Signed)
[facility]  Patient Status: New pt for [facility] this admission  [facility] is providing the following services: Rick [name] home inotrope pharmacy team.  [facility] hospital team will follow Rick [name] while an inpatient to support his transition home when deemed ready.  [place] will partner with [facility] to provide [facility] nursing for Heart Failure management/education at home.  [place] will be able to support home Milrinone for Rick [name] if ordered/needed at DC.   If patient discharges after hours, please call [phone].   Larry Sierras 11/10/2015, 1:37 PM

## 2015-11-11 ENCOUNTER — Encounter (HOSPITAL_COMMUNITY)
Admission: AD | Disposition: A | Discharge status: Home Health | Payer: Self-pay | Source: Ambulatory Visit | Attending: Internal Medicine

## 2015-11-11 ENCOUNTER — Encounter (HOSPITAL_COMMUNITY): Payer: Self-pay | Admitting: Internal Medicine

## 2015-11-11 HISTORY — PX: CARDIAC CATHETERIZATION: SHX172

## 2015-11-11 LAB — CARBOXYHEMOGLOBIN
Carboxyhemoglobin: 2.2 % — ABNORMAL HIGH (ref 0.5–1.5)
Methemoglobin: 0.8 % (ref 0.0–1.5)
O2 SAT: 59.5 %
TOTAL HEMOGLOBIN: 13.1 g/dL — AB (ref 13.5–18.0)

## 2015-11-11 LAB — POCT I-STAT 3, ART BLOOD GAS (G3+)
ACID-BASE DEFICIT: 1 mmol/L (ref 0.0–2.0)
BICARBONATE: 22.4 meq/L (ref 20.0–24.0)
O2 Saturation: 96 %
PO2 ART: 81 mmHg (ref 80.0–100.0)
TCO2: 23 mmol/L (ref 0–100)
pCO2 arterial: 32.7 mmHg — ABNORMAL LOW (ref 35.0–45.0)
pH, Arterial: 7.444 (ref 7.350–7.450)

## 2015-11-11 LAB — POCT I-STAT 3, VENOUS BLOOD GAS (G3P V)
Acid-Base Excess: 1 mmol/L (ref 0.0–2.0)
Acid-base deficit: 1 mmol/L (ref 0.0–2.0)
BICARBONATE: 23.6 meq/L (ref 20.0–24.0)
BICARBONATE: 25.3 meq/L — AB (ref 20.0–24.0)
O2 SAT: 63 %
O2 Saturation: 68 %
PCO2 VEN: 38.7 mmHg — AB (ref 45.0–50.0)
PO2 VEN: 32 mmHg (ref 31.0–45.0)
TCO2: 25 mmol/L (ref 0–100)
TCO2: 26 mmol/L (ref 0–100)
pCO2, Ven: 38.5 mmHg — ABNORMAL LOW (ref 45.0–50.0)
pH, Ven: 7.395 — ABNORMAL HIGH (ref 7.250–7.300)
pH, Ven: 7.424 — ABNORMAL HIGH (ref 7.250–7.300)
pO2, Ven: 36 mmHg (ref 31.0–45.0)

## 2015-11-11 LAB — CBC
HEMATOCRIT: 36.2 % — AB (ref 39.0–52.0)
HEMOGLOBIN: 11.1 g/dL — AB (ref 13.0–17.0)
MCH: 27.3 pg (ref 26.0–34.0)
MCHC: 30.7 g/dL (ref 30.0–36.0)
MCV: 88.9 fL (ref 78.0–100.0)
Platelets: 185 10*3/uL (ref 150–400)
RBC: 4.07 MIL/uL — ABNORMAL LOW (ref 4.22–5.81)
RDW: 20.6 % — ABNORMAL HIGH (ref 11.5–15.5)
WBC: 6.5 10*3/uL (ref 4.0–10.5)

## 2015-11-11 LAB — PROTIME-INR
INR: 1.78 — AB (ref 0.00–1.49)
Prothrombin Time: 20.7 seconds — ABNORMAL HIGH (ref 11.6–15.2)

## 2015-11-11 LAB — BASIC METABOLIC PANEL
ANION GAP: 9 (ref 5–15)
BUN: 22 mg/dL — ABNORMAL HIGH (ref 6–20)
CALCIUM: 9.5 mg/dL (ref 8.9–10.3)
CHLORIDE: 98 mmol/L — AB (ref 101–111)
CO2: 27 mmol/L (ref 22–32)
Creatinine, Ser: 0.98 mg/dL (ref 0.61–1.24)
GFR calc non Af Amer: >60 mL/min (ref >60)
Glucose, Bld: 103 mg/dL — ABNORMAL HIGH (ref 65–99)
Potassium: 3.7 mmol/L (ref 3.5–5.1)
SODIUM: 134 mmol/L — AB (ref 135–145)

## 2015-11-11 LAB — POCT ACTIVATED CLOTTING TIME: ACTIVATED CLOTTING TIME: 153 s

## 2015-11-11 LAB — HEPARIN LEVEL (UNFRACTIONATED): HEPARIN UNFRACTIONATED: 0.21 [IU]/mL — AB (ref 0.30–0.70)

## 2015-11-11 SURGERY — RIGHT/LEFT HEART CATH AND CORONARY ANGIOGRAPHY

## 2015-11-11 MED ORDER — SODIUM CHLORIDE 0.9 % IV SOLN: 250.0000 mL | INTRAVENOUS | Status: DC | PRN

## 2015-11-11 MED ORDER — SODIUM CHLORIDE 0.9% FLUSH: 3.0000 mL | Freq: Two times a day (BID) | INTRAVENOUS | Status: DC

## 2015-11-11 MED ORDER — IOPAMIDOL (ISOVUE-370) INJECTION 76%
INTRAVENOUS | Status: DC | PRN
  Administered 2015-11-11: 105 mL via INTRA_ARTERIAL

## 2015-11-11 MED ORDER — IOPAMIDOL (ISOVUE-370) INJECTION 76%
INTRAVENOUS | Status: AC
  Filled 2015-11-11: qty 100

## 2015-11-11 MED ORDER — ACETAMINOPHEN 325 MG PO TABS: 650.0000 mg | ORAL_TABLET | ORAL | Status: DC | PRN

## 2015-11-11 MED ORDER — HEPARIN (PORCINE) IN NACL 100-0.45 UNIT/ML-% IJ SOLN
1200.0000 [IU]/h | INTRAMUSCULAR | Status: DC
  Administered 2015-11-11: 1200 [IU]/h via INTRAVENOUS
  Filled 2015-11-11: qty 250

## 2015-11-11 MED ORDER — ONDANSETRON HCL 4 MG/2ML IJ SOLN: 4.0000 mg | Freq: Four times a day (QID) | INTRAMUSCULAR | Status: DC | PRN

## 2015-11-11 MED ORDER — WARFARIN SODIUM 5 MG PO TABS
5.0000 mg | ORAL_TABLET | Freq: Once | ORAL | Status: AC
  Administered 2015-11-11: 5 mg via ORAL
  Filled 2015-11-11: qty 1

## 2015-11-11 MED ORDER — LIDOCAINE HCL (PF) 1 % IJ SOLN
INTRAMUSCULAR | Status: DC | PRN
  Administered 2015-11-11: 20 mL

## 2015-11-11 MED ORDER — HEPARIN (PORCINE) IN NACL 2-0.9 UNIT/ML-% IJ SOLN
INTRAMUSCULAR | Status: AC
  Filled 2015-11-11: qty 500

## 2015-11-11 MED ORDER — SODIUM CHLORIDE 0.9% FLUSH: 3.0000 mL | INTRAVENOUS | Status: DC | PRN

## 2015-11-11 MED ORDER — POTASSIUM CHLORIDE CRYS ER 20 MEQ PO TBCR
20.0000 meq | EXTENDED_RELEASE_TABLET | Freq: Once | ORAL | Status: AC
  Administered 2015-11-11: 20 meq via ORAL
  Filled 2015-11-11: qty 1

## 2015-11-11 MED ORDER — SODIUM CHLORIDE 0.9 % IV SOLN: INTRAVENOUS | Status: AC

## 2015-11-11 MED ORDER — HEPARIN (PORCINE) IN NACL 2-0.9 UNIT/ML-% IJ SOLN
INTRAMUSCULAR | Status: AC
  Filled 2015-11-11: qty 1000

## 2015-11-11 MED ORDER — IOPAMIDOL (ISOVUE-370) INJECTION 76%
INTRAVENOUS | Status: AC
  Filled 2015-11-11: qty 50

## 2015-11-11 MED ORDER — LIDOCAINE HCL (PF) 1 % IJ SOLN
INTRAMUSCULAR | Status: AC
  Filled 2015-11-11: qty 30

## 2015-11-11 MED ORDER — HEPARIN (PORCINE) IN NACL 2-0.9 UNIT/ML-% IJ SOLN
INTRAMUSCULAR | Status: DC | PRN
  Administered 2015-11-11: 1500 mL

## 2015-11-11 MED ORDER — WARFARIN - PHARMACIST DOSING INPATIENT: Freq: Every day | Status: DC

## 2015-11-11 SURGICAL SUPPLY — 14 items
CATH INFINITI 5 FR IM (CATHETERS) ×3 IMPLANT
CATH INFINITI 5 FR LCB (CATHETERS) ×3 IMPLANT
CATH INFINITI 5FR MULTPACK ANG (CATHETERS) ×3 IMPLANT
CATH SWAN GANZ 7F STRAIGHT (CATHETERS) ×3 IMPLANT
KIT HEART LEFT (KITS) ×3 IMPLANT
KIT HEART RIGHT NAMIC (KITS) ×3 IMPLANT
PACK CARDIAC CATHETERIZATION (CUSTOM PROCEDURE TRAY) ×3 IMPLANT
SHEATH PINNACLE 5F 10CM (SHEATH) ×3 IMPLANT
SHEATH PINNACLE 7F 10CM (SHEATH) ×3 IMPLANT
SYR MEDRAD MARK V 150ML (SYRINGE) ×3 IMPLANT
TRANSDUCER W/STOPCOCK (MISCELLANEOUS) ×6 IMPLANT
TUBING CIL FLEX 10 FLL-RA (TUBING) ×3 IMPLANT
WIRE EMERALD 3MM-J .035X150CM (WIRE) ×3 IMPLANT
WIRE EMERALD 3MM-J .035X260CM (WIRE) ×3 IMPLANT

## 2015-11-11 NOTE — Progress Notes (Signed)
ANTICOAGULATION CONSULT NOTE - Follow Up Consult  Pharmacy Consult for heparin Indication: h/o PE  Labs:  Recent Labs  11/09/15 0513 11/09/15 0850 11/10/15 0456 11/10/15 1630 11/10/15 1814 11/11/15 0430  HGB 11.0*  --   --   --   --  11.1*  HCT 36.2*  --   --   --   --  36.2*  PLT 174  --   --   --   --  185  LABPROT 22.6*  --  22.3*  --   --  20.7*  INR 2.01*  --  1.97*  --   --  1.78*  HEPARINUNFRC  --   --   --  1.10* <0.10* 0.21*  CREATININE  --  0.98 1.05  --   --   --     Assessment: 70yo male subtherapeutic on heparin after rate change though closer to goal.  Goal of Therapy:  Heparin level 0.3-0.7 units/ml   Plan:  Will increase heparin gtt by 2 units/kg/hr to 1200 units/hr and check level in 6hr.  Wynona Neat, PharmD, BCPS  11/11/2015,5:07 AM

## 2015-11-11 NOTE — Plan of Care (Signed)
Problem: Activity: Goal: Capacity to carry out activities will improve Outcome: Completed/Met Date Met:  11/11/15 Ambulating with physical therapy and cardiac rehab.

## 2015-11-11 NOTE — Care Management Important Message (Signed)
Important Message  Patient Details  Name: Rick [name] MRN: [id number] Date of Birth: [birth year]   Medicare Important Message Given:  Yes    [name] May 11/11/2015, 10:44 AM

## 2015-11-11 NOTE — H&P (View-Only) (Signed)
Advanced Heart Failure Rounding Note  Primary Care: Wende Neighbors, MD  Primary Cardiologist: Dr Harl Bowie   Subjective:    Admitted 11/03/15 with marked volume overload, found to be in cardiogenic shock with initial coox of 41%. Started on milrinone.   Milrinone stopped yesterday. More fatigued today.  Weight down 27 pounds. Was lightheaded yesterday so i gave him 250cc IVF back.   No dyspnea. Creatinine normalized  Objective:   Weight Range: 67.541 kg (148 lb 14.4 oz) Body mass index is 20.78 kg/(m^2).   Vital Signs:   Temp:  [97.7 F (36.5 C)-98.7 F (37.1 C)] 98.1 F (36.7 C) (06/08 0614) Pulse Rate:  [92-95] 95 (06/07 2018) Resp:  [10-26] 16 (06/08 0800) BP: (82-108)/(54-86) 104/78 mmHg (06/08 0800) SpO2:  [98 %-100 %] 100 % (06/08 0614) Weight:  [67.541 kg (148 lb 14.4 oz)] 67.541 kg (148 lb 14.4 oz) (06/08 0614) Last BM Date: 11/08/15  Weight change: Filed Weights   11/09/15 0500 11/10/15 0500 11/11/15 0614  Weight: 68.675 kg (151 lb 6.4 oz) 67.223 kg (148 lb 3.2 oz) 67.541 kg (148 lb 14.4 oz)    Intake/Output:   Intake/Output Summary (Last 24 hours) at 11/11/15 0858 Last data filed at 11/11/15 0600  Gross per 24 hour  Intake 1634.5 ml  Output   1800 ml  Net -165.5 ml     Physical Exam: General: Elderly appearing. Fatigued appearing. Lying in bed.  HEENT: normal Neck: supple. JVP 5-6. Carotids 2+ bilat; no bruits. No thyromegaly or nodule noted.  Cor: PMI nondisplaced. RRR. No rubs. 2/6 MR. + S3. Lungs: Slightly diminished basilar sounds, scant basilar crackles.  Abdomen: soft, NT, ND, no HSM. No bruits or masses. +BS  Extremities: no cyanosis, clubbing, rash. Minimal ankle edema Neuro: alert & oriented x 3, cranial nerves grossly intact. moves all 4 extremities w/o difficulty. Affect pleasant.  Telemetry: Reviewed, NSR 70-80s. Occasional PVCs  Labs: CBC  Recent Labs  11/09/15 0513 11/11/15 0430  WBC 8.1 6.5  NEUTROABS 5.0  --   HGB 11.0*  11.1*  HCT 36.2* 36.2*  MCV 88.5 88.9  PLT 174 [id number]   Basic Metabolic Panel  Recent Labs  11/10/15 0456 11/11/15 0430  NA 135 134*  K 4.7 3.7  CL 102 98*  CO2 25 27  GLUCOSE 133* 103*  BUN 22* 22*  CREATININE 1.05 0.98  CALCIUM 9.5 9.5   Liver Function Tests No results for input(s): AST, ALT, ALKPHOS, BILITOT, PROT, ALBUMIN in the last 72 hours. No results for input(s): LIPASE, AMYLASE in the last 72 hours. Cardiac Enzymes No results for input(s): CKTOTAL, CKMB, CKMBINDEX, TROPONINI in the last 72 hours.  BNP: BNP (last 3 results)  Recent Labs  10/07/15 1227 11/03/15 1455  BNP 2346.0* 2344.5*    ProBNP (last 3 results) No results for input(s): PROBNP in the last 8760 hours.   D-Dimer No results for input(s): DDIMER in the last 72 hours. Hemoglobin A1C No results for input(s): HGBA1C in the last 72 hours. Fasting Lipid Panel No results for input(s): CHOL, HDL, LDLCALC, TRIG, CHOLHDL, LDLDIRECT in the last 72 hours. Thyroid Function Tests No results for input(s): TSH, T4TOTAL, T3FREE, THYROIDAB in the last 72 hours.  Invalid input(s): FREET3  Other results:     Imaging/Studies:  No results found.  Latest Echo  Latest Cath   Medications:     Scheduled Medications: . ferrous sulfate  325 mg Oral Q breakfast  . isosorbide-hydrALAZINE  1 tablet Oral TID  .  Living Better with Heart Failure Book   Does not apply Once  . sodium chloride flush  10-40 mL Intracatheter Q12H  . sodium chloride flush  3 mL Intravenous Q12H  . sodium chloride flush  3 mL Intravenous Q12H  . spironolactone  12.5 mg Oral Daily    Infusions: . sodium chloride 10 mL/hr (11/11/15 0000)  . heparin 1,200 Units/hr (11/11/15 0516)    PRN Medications: sodium chloride, sodium chloride, acetaminophen, ondansetron (ZOFRAN) IV, polyethylene glycol, RESOURCE THICKENUP CLEAR, sodium chloride flush, sodium chloride flush, sodium chloride flush, traMADol   Assessment   1. Acute  on chronic combined CHF - Echo 10/08/15 LVEF 15-20% with ICM -> cardiogenic shock 2. CAD s/p CABG 3. HTN 4. ETOH abuse 5. Hx of CVA 6. Anemia, iron deficient  7. Hypokalemia   Plan    Now euvolemic  Coox 59% off milrinone x 24 hours  Will plan R/L heart cath today. Need to follow co-ox at least another 24 hours off milrinone. I suspect he will need home milrinone  Iron stores markedly low 11/04/15. Got one dose Feraheme. Hgb improved after 2 UPRBCs ad continues to trend up. Anemia may be related to chronic disease with no overt bleeding.  He is overdue for colonoscopy.   Continue Bidil 1 tab TID.  BP too low to titrate. No b-blocker due to low output. No ACE with low BP and recent AKI.   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   Length of Stay: 8  Bensimhon, Daniel MD  11/11/2015, 8:58 AM  Advanced Heart Failure Team Pager [phone] (M-F; 7a - 4p)  Please contact [facility] for night-coverage after hours (4p -7a ) and weekends on [email or web address]

## 2015-11-11 NOTE — Interval H&P Note (Signed)
History and Physical Interval Note:  11/11/2015 10:54 AM  Rick [name]  has presented today for surgery, with the diagnosis of hf  The various methods of treatment have been discussed with the patient and family. After consideration of risks, benefits and other options for treatment, the patient has consented to  Procedure(s): Right/Left Heart Cath and Coronary Angiography (N/A) and possible angioplasty as a surgical intervention .  The patient's history has been reviewed, patient examined, no change in status, stable for surgery.  I have reviewed the patient's chart and labs.  Questions were answered to the patient's satisfaction.     Munachimso Palin, Quillian Quince

## 2015-11-11 NOTE — Plan of Care (Signed)
Problem: Education: Goal: Ability to demonstrate managment of disease process will improve Outcome: Progressing Watched video on heart cath with stenting and all questions answered, patient says that he somewhat remembers having it done previously, reviewed some infor on heart failure and diet but concentrated on procedure for tomorrow, will continue to monitor

## 2015-11-11 NOTE — Progress Notes (Signed)
Report received and updated in patient's room via [place] RN using [employer], updated on VS, new orders, labs and events of the day, assumed care of patient.

## 2015-11-11 NOTE — Progress Notes (Signed)
ANTICOAGULATION CONSULT NOTE - Follow Up Consult  Pharmacy Consult for Coumadin (resume), heparin while INR < 2 Indication: hx PE  No Known Allergies  Patient Measurements: Height: 5\' 11"  (180.3 cm) Weight: 148 lb 14.4 oz (67.541 kg) IBW/kg (Calculated) : 75.3  Vital Signs: Temp: 98.1 F (36.7 C) (06/08 0614) Temp Source: Oral (06/08 0614) BP: 101/63 mmHg (06/08 1202) Pulse Rate: 0 (06/08 1207)  Labs:  Recent Labs  11/09/15 0513 11/09/15 0850 11/10/15 0456 11/10/15 1630 11/10/15 1814 11/11/15 0430  HGB 11.0*  --   --   --   --  11.1*  HCT 36.2*  --   --   --   --  36.2*  PLT 174  --   --   --   --  185  LABPROT 22.6*  --  22.3*  --   --  20.7*  INR 2.01*  --  1.97*  --   --  1.78*  HEPARINUNFRC  --   --   --  1.10* <0.10* 0.21*  CREATININE  --  0.98 1.05  --   --  0.98    Estimated Creatinine Clearance: 67.9 mL/min (by C-G formula based on Cr of 0.98).   . sodium chloride 75 mL/hr at 11/11/15 1302  . heparin      Assessment: 71 yom on chronic coumadin for hx PE. INR subtherapeutic at 1.78 after holding doses in anticipation of cath lab. CBC stable. No bleeding.  Asked to resume Coumadin tonight.  Heparin level was low this AM on heparin at 1050 units/hr, and heparin was increased to 1200 units/hr. Heparin turned off for cath lab before repeat level was drawn.  Asked to resume IV heparin 8 hrs after sheath pull (was removed at 1151 AM).  Goal of Therapy:  INR 2-3 Monitor platelets by anticoagulation protocol: Yes   Plan:  1) Resume heparin at 1200 units/hr at 8 pm tonight. 2.. Check heparin level 6 hrs after gtt restarted. 3. Coumadin 5 mg po x 1 tonight. 4. Daily CBC, INR and heparin level. 5. D/c heparin once INR > 2.  Uvaldo Rising, BCPS  Clinical Pharmacist Pager [phone]  11/11/2015 1:29 PM

## 2015-11-11 NOTE — Progress Notes (Addendum)
Advanced Heart Failure Rounding Note  Primary Care: Wende Neighbors, MD  Primary Cardiologist: Dr Harl Bowie   Subjective:    Admitted 11/03/15 with marked volume overload, found to be in cardiogenic shock with initial coox of 41%. Started on milrinone.   Milrinone stopped yesterday. More fatigued today.  Weight down 27 pounds. Was lightheaded yesterday so i gave him 250cc IVF back.   No dyspnea. Creatinine normalized  Objective:   Weight Range: 67.541 kg (148 lb 14.4 oz) Body mass index is 20.78 kg/(m^2).   Vital Signs:   Temp:  [97.7 F (36.5 C)-98.7 F (37.1 C)] 98.1 F (36.7 C) (06/08 0614) Pulse Rate:  [92-95] 95 (06/07 2018) Resp:  [10-26] 16 (06/08 0800) BP: (82-108)/(54-86) 104/78 mmHg (06/08 0800) SpO2:  [98 %-100 %] 100 % (06/08 0614) Weight:  [67.541 kg (148 lb 14.4 oz)] 67.541 kg (148 lb 14.4 oz) (06/08 0614) Last BM Date: 11/08/15  Weight change: Filed Weights   11/09/15 0500 11/10/15 0500 11/11/15 0614  Weight: 68.675 kg (151 lb 6.4 oz) 67.223 kg (148 lb 3.2 oz) 67.541 kg (148 lb 14.4 oz)    Intake/Output:   Intake/Output Summary (Last 24 hours) at 11/11/15 0858 Last data filed at 11/11/15 0600  Gross per 24 hour  Intake 1634.5 ml  Output   1800 ml  Net -165.5 ml     Physical Exam: General: Elderly appearing. Fatigued appearing. Lying in bed.  HEENT: normal Neck: supple. JVP 5-6. Carotids 2+ bilat; no bruits. No thyromegaly or nodule noted.  Cor: PMI nondisplaced. RRR. No rubs. 2/6 MR. + S3. Lungs: Slightly diminished basilar sounds, scant basilar crackles.  Abdomen: soft, NT, ND, no HSM. No bruits or masses. +BS  Extremities: no cyanosis, clubbing, rash. Minimal ankle edema Neuro: alert & oriented x 3, cranial nerves grossly intact. moves all 4 extremities w/o difficulty. Affect pleasant.  Telemetry: Reviewed, NSR 70-80s. Occasional PVCs  Labs: CBC  Recent Labs  11/09/15 0513 11/11/15 0430  WBC 8.1 6.5  NEUTROABS 5.0  --   HGB 11.0*  11.1*  HCT 36.2* 36.2*  MCV 88.5 88.9  PLT 174 [id number]   Basic Metabolic Panel  Recent Labs  11/10/15 0456 11/11/15 0430  NA 135 134*  K 4.7 3.7  CL 102 98*  CO2 25 27  GLUCOSE 133* 103*  BUN 22* 22*  CREATININE 1.05 0.98  CALCIUM 9.5 9.5   Liver Function Tests No results for input(s): AST, ALT, ALKPHOS, BILITOT, PROT, ALBUMIN in the last 72 hours. No results for input(s): LIPASE, AMYLASE in the last 72 hours. Cardiac Enzymes No results for input(s): CKTOTAL, CKMB, CKMBINDEX, TROPONINI in the last 72 hours.  BNP: BNP (last 3 results)  Recent Labs  10/07/15 1227 11/03/15 1455  BNP 2346.0* 2344.5*    ProBNP (last 3 results) No results for input(s): PROBNP in the last 8760 hours.   D-Dimer No results for input(s): DDIMER in the last 72 hours. Hemoglobin A1C No results for input(s): HGBA1C in the last 72 hours. Fasting Lipid Panel No results for input(s): CHOL, HDL, LDLCALC, TRIG, CHOLHDL, LDLDIRECT in the last 72 hours. Thyroid Function Tests No results for input(s): TSH, T4TOTAL, T3FREE, THYROIDAB in the last 72 hours.  Invalid input(s): FREET3  Other results:     Imaging/Studies:  No results found.  Latest Echo  Latest Cath   Medications:     Scheduled Medications: . ferrous sulfate  325 mg Oral Q breakfast  . isosorbide-hydrALAZINE  1 tablet Oral TID  .  Living Better with Heart Failure Book   Does not apply Once  . sodium chloride flush  10-40 mL Intracatheter Q12H  . sodium chloride flush  3 mL Intravenous Q12H  . sodium chloride flush  3 mL Intravenous Q12H  . spironolactone  12.5 mg Oral Daily    Infusions: . sodium chloride 10 mL/hr (11/11/15 0000)  . heparin 1,200 Units/hr (11/11/15 0516)    PRN Medications: sodium chloride, sodium chloride, acetaminophen, ondansetron (ZOFRAN) IV, polyethylene glycol, RESOURCE THICKENUP CLEAR, sodium chloride flush, sodium chloride flush, sodium chloride flush, traMADol   Assessment   1. Acute  on chronic combined CHF - Echo 10/08/15 LVEF 15-20% with ICM -> cardiogenic shock 2. CAD s/p CABG 3. HTN 4. ETOH abuse 5. Hx of CVA 6. Anemia, iron deficient  7. Hypokalemia   Plan    Now euvolemic  Coox 59% off milrinone x 24 hours  Will plan R/L heart cath today. Need to follow co-ox at least another 24 hours off milrinone. I suspect he will need home milrinone  Iron stores markedly low 11/04/15. Got one dose Feraheme. Hgb improved after 2 UPRBCs ad continues to trend up. Anemia may be related to chronic disease with no overt bleeding.  He is overdue for colonoscopy.   Continue Bidil 1 tab TID.  BP too low to titrate. No b-blocker due to low output. No ACE with low BP and recent AKI.   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   Length of Stay: 8  Bensimhon, Daniel MD  11/11/2015, 8:58 AM  Advanced Heart Failure Team Pager [phone] (M-F; 7a - 4p)  Please contact [facility] for night-coverage after hours (4p -7a ) and weekends on [email or web address]

## 2015-11-12 LAB — PROTIME-INR
INR: 1.73 — ABNORMAL HIGH (ref 0.00–1.49)
Prothrombin Time: 20.2 seconds — ABNORMAL HIGH (ref 11.6–15.2)

## 2015-11-12 LAB — CBC
HEMATOCRIT: 34.8 % — AB (ref 39.0–52.0)
HEMOGLOBIN: 10.6 g/dL — AB (ref 13.0–17.0)
MCH: 26.9 pg (ref 26.0–34.0)
MCHC: 30.5 g/dL (ref 30.0–36.0)
MCV: 88.3 fL (ref 78.0–100.0)
Platelets: 186 10*3/uL (ref 150–400)
RBC: 3.94 MIL/uL — AB (ref 4.22–5.81)
RDW: 20.4 % — ABNORMAL HIGH (ref 11.5–15.5)
WBC: 5.4 10*3/uL (ref 4.0–10.5)

## 2015-11-12 LAB — BASIC METABOLIC PANEL
ANION GAP: 9 (ref 5–15)
BUN: 20 mg/dL (ref 6–20)
CHLORIDE: 100 mmol/L — AB (ref 101–111)
CO2: 24 mmol/L (ref 22–32)
CREATININE: 1.03 mg/dL (ref 0.61–1.24)
Calcium: 9.3 mg/dL (ref 8.9–10.3)
GFR calc non Af Amer: >60 mL/min (ref >60)
Glucose, Bld: 161 mg/dL — ABNORMAL HIGH (ref 65–99)
POTASSIUM: 4.2 mmol/L (ref 3.5–5.1)
Sodium: 133 mmol/L — ABNORMAL LOW (ref 135–145)

## 2015-11-12 LAB — CARBOXYHEMOGLOBIN
Carboxyhemoglobin: 1.7 % — ABNORMAL HIGH (ref 0.5–1.5)
Methemoglobin: 0.8 % (ref 0.0–1.5)
O2 SAT: 57.7 %
TOTAL HEMOGLOBIN: 11.1 g/dL — AB (ref 13.5–18.0)

## 2015-11-12 LAB — HEPARIN LEVEL (UNFRACTIONATED): HEPARIN UNFRACTIONATED: 0.41 [IU]/mL (ref 0.30–0.70)

## 2015-11-12 MED ORDER — WARFARIN SODIUM 5 MG PO TABS: 6.0000 mg | ORAL_TABLET | Freq: Every day | ORAL | Status: DC

## 2015-11-12 MED ORDER — ISOSORB DINITRATE-HYDRALAZINE 20-37.5 MG PO TABS: 1.0000 | ORAL_TABLET | Freq: Three times a day (TID) | ORAL | Status: DC

## 2015-11-12 MED ORDER — METOLAZONE 5 MG PO TABS: 5.0000 mg | ORAL_TABLET | ORAL | Status: DC | PRN

## 2015-11-12 MED ORDER — WARFARIN SODIUM 3 MG PO TABS: ORAL_TABLET | ORAL | Status: DC

## 2015-11-12 MED ORDER — METOLAZONE 5 MG PO TABS: 2.5000 mg | ORAL_TABLET | ORAL | Status: DC | PRN

## 2015-11-12 MED ORDER — TORSEMIDE 20 MG PO TABS: 40.0000 mg | ORAL_TABLET | Freq: Two times a day (BID) | ORAL | Status: DC

## 2015-11-12 MED ORDER — WARFARIN SODIUM 2 MG PO TABS: 3.0000 mg | ORAL_TABLET | Freq: Every day | ORAL | Status: DC

## 2015-11-12 MED ORDER — SPIRONOLACTONE 25 MG PO TABS: 12.5000 mg | ORAL_TABLET | Freq: Every day | ORAL | Status: DC

## 2015-11-12 NOTE — Progress Notes (Signed)
ANTICOAGULATION CONSULT NOTE - Follow Up Consult  Pharmacy Consult for Coumadin (resume), heparin while INR < 2 Indication: hx PE  2013  No Known Allergies  Patient Measurements: Height: 5\' 11"  (180.3 cm) Weight: 150 lb 4.8 oz (68.176 kg) IBW/kg (Calculated) : 75.3  Vital Signs: Temp: 98.2 F (36.8 C) (06/09 0435) BP: 105/83 mmHg (06/09 0435) Pulse Rate: 81 (06/09 0435)  Labs:  Recent Labs  11/10/15 0456  11/10/15 1814 11/11/15 0430 11/12/15 0500  HGB  --   --   --  11.1* 10.6*  HCT  --   --   --  36.2* 34.8*  PLT  --   --   --  185 186  LABPROT 22.3*  --   --  20.7* 20.2*  INR 1.97*  --   --  1.78* 1.73*  HEPARINUNFRC  --   < > <0.10* 0.21* 0.41  CREATININE 1.05  --   --  0.98 1.03  < > = values in this interval not displayed.  Estimated Creatinine Clearance: 65.3 mL/min (by C-G formula based on Cr of 1.03).      Assessment: 8 yom on chronic coumadin for hx PE 2013. INR subtherapeutic at 1.73 after holding doses for cath lab. CBC stable. No bleeding.  Asked to resumed Coumadin last night  Plan to d/c home will boost x2 days then restart home dose.  Heparin level 0.4 this AM on heparin at 1200 units/hr - turn off for d/c  Goal of Therapy:  HL 0.3-0.7 INR 2-3 Monitor platelets by anticoagulation protocol: Yes   Plan: . D/C heparin drip Coumadin 6,g x2 days then resume 3mg  daily - home dose Plan to check INR on Tues at MD office   Bonnita Nasuti Pharm.D. CPP, BCPS Clinical Pharmacist [phone] 11/12/2015 9:44 AM

## 2015-11-12 NOTE — Plan of Care (Signed)
Problem: Education: Goal: Knowledge of [facility] Education information/materials will improve Outcome: Progressing Reviewed information regarding patient's disease, medications, tests and POC, questions answered and patient verbalized understanding, will continue to monitor.

## 2015-11-12 NOTE — Plan of Care (Signed)
Problem: Education: Goal: Ability to demonstrate managment of disease process will improve Outcome: Completed/Met Date Met:  11/12/15 Patient viewed heart cath videos with his wife on 6/7 early evening and reviewed what the procedure was about and what to expect, questions answered at this time, reviewed totay foods low in sodium, fluid restrictions, activity in small amounts and increased gradually to keep workload of heart at a minimum and patient verbalized understanding.

## 2015-11-12 NOTE — Progress Notes (Signed)
CARDIAC REHAB PHASE I   PRE:  Rate/Rhythm: 23 SR c/ PVCs  BP:  Sitting: 10365        SaO2: 100 RA  MODE:  Ambulation: 550 ft   POST:  Rate/Rhythm: 84 SR  BP:  Sitting: 108/87         SaO2: 99 RA  Pt ambulated 550 ft on RA, cane, hand held assist, fairly steady gait, tolerated well with no complaints. Pt much stronger today than on previous walk. Pt and wife states they have reviewed CHF education, have no questions at this time. Reinforced daily weights, medication compliance, sodium and fluid restrictions. Discussed phase 2 cardiac rehab, pt agrees to referral, will send to [facility] per pt request. Pt to edge of bed after walk, call bell within reach, awaiting discharge.   Clint, RN, BSN 11/12/2015 11:40 AM

## 2015-11-12 NOTE — Progress Notes (Signed)
ANTICOAGULATION CONSULT NOTE - Follow Up Consult  Pharmacy Consult for Heparin while INR is <2 Indication: Hx PE  No Known Allergies  Patient Measurements: Height: 5\' 11"  (180.3 cm) Weight: 150 lb 4.8 oz (68.176 kg) IBW/kg (Calculated) : 75.3  Vital Signs: Temp: 98.2 F (36.8 C) (06/09 0435) BP: 105/83 mmHg (06/09 0435) Pulse Rate: 81 (06/09 0435)  Labs:  Recent Labs  11/09/15 0850 11/10/15 0456  11/10/15 1814 11/11/15 0430 11/12/15 0500  HGB  --   --   --   --  11.1* 10.6*  HCT  --   --   --   --  36.2* 34.8*  PLT  --   --   --   --  185 186  LABPROT  --  22.3*  --   --  20.7* 20.2*  INR  --  1.97*  --   --  1.78* 1.73*  HEPARINUNFRC  --   --   < > <0.10* 0.21* 0.41  CREATININE 0.98 1.05  --   --  0.98  --   < > = values in this interval not displayed.  Estimated Creatinine Clearance: 68.6 mL/min (by C-G formula based on Cr of 0.98).   Assessment: Heparin for hx PE while INR is low, heparin level is therapeutic x 1 after re-start s/p cath  Goal of Therapy:  Heparin level 0.3-0.7 units/ml Monitor platelets by anticoagulation protocol: Yes   Plan:  -Cont heparin at 1200 units/hr -1200 HL  Abu, [place] 11/12/2015,6:08 AM

## 2015-11-12 NOTE — Progress Notes (Signed)
Updated report with VS, new orders, meds and today's events given via [place] using SBAR format, assumed care of patient.

## 2015-11-12 NOTE — Progress Notes (Signed)
[facility]  [facility] was following until DC for possible Home Milrinone.  Pt DC'd without need for Milrinone at this time.  [facility] was prepared to manage for HF management and Milrinone if needed. Advised Rosaura Carpenter, RN with [facility] that pt will DC home WITHOUT Milrinone but will need home care/HHRN for HF management.  [facility] will be on standby if home Milrinone is needed at a later date. All hospital records including orders and F2F faxed to [facility]. Confirmed with [facility] will see pt on Saturday, 11-13-15 for admission visit. Contact information for [facility]:  Phone:  [phone]   Fax: [phone]  If patient discharges after hours, please call [phone].   Larry Sierras 11/12/2015, 11:20 AM

## 2015-11-12 NOTE — Discharge Instructions (Signed)
Information on my medicine - Coumadin®   (Warfarin) ° °This medication education was reviewed with me or my healthcare representative as part of my discharge preparation.  ° °Why was Coumadin prescribed for you? °Coumadin was prescribed for you because you have a blood clot or a medical condition that can cause an increased risk of forming blood clots. Blood clots can cause serious health problems by blocking the flow of blood to the heart, lung, or brain. Coumadin can prevent harmful blood clots from forming. °As a reminder your indication for Coumadin is:   Pulmonary Embolism Treatment ° °What test will check on my response to Coumadin? °While on Coumadin (warfarin) you will need to have an INR test regularly to ensure that your dose is keeping you in the desired range. The INR (international normalized ratio) number is calculated from the result of the laboratory test called prothrombin time (PT). ° °If an INR APPOINTMENT HAS NOT ALREADY BEEN MADE FOR YOU please schedule an appointment to have this lab work done by your health care provider within 7 days. °Your INR goal is a number between:  2 to 3  °What  do you need to  know  About  COUMADIN? °Take Coumadin (warfarin) exactly as prescribed by your healthcare provider about the same time each day.  DO NOT stop taking without talking to the doctor who prescribed the medication.  Stopping without other blood clot prevention medication to take the place of Coumadin may increase your risk of developing a new clot or stroke.  Get refills before you run out. ° °What do you do if you miss a dose? °If you miss a dose, take it as soon as you remember on the same day then continue your regularly scheduled regimen the next day.  Do not take two doses of Coumadin at the same time. ° °Important Safety Information °A possible side effect of Coumadin (Warfarin) is an increased risk of bleeding. You should call your healthcare provider right away if you experience any of the  following: °? Bleeding from an injury or your nose that does not stop. °? Unusual colored urine (red or dark brown) or unusual colored stools (red or black). °? Unusual bruising for unknown reasons. °? A serious fall or if you hit your head (even if there is no bleeding). ° °Some foods or medicines interact with Coumadin® (warfarin) and might alter your response to warfarin. To help avoid this: °? Eat a balanced diet, maintaining a consistent amount of Vitamin K. °? Notify your provider about major diet changes you plan to make. °? Avoid alcohol or limit your intake to 1 drink for women and 2 drinks for men per day. °(1 drink is 5 oz. wine, 12 oz. beer, or 1.5 oz. liquor.) ° °Make sure that ANY health care provider who prescribes medication for you knows that you are taking Coumadin (warfarin).  Also make sure the healthcare provider who is monitoring your Coumadin knows when you have started a new medication including herbals and non-prescription products. ° °Coumadin® (Warfarin)  Major Drug Interactions  °Increased Warfarin Effect Decreased Warfarin Effect  °Alcohol (large quantities) °Antibiotics (esp. Septra/Bactrim, Flagyl, Cipro) °Amiodarone (Cordarone) °Aspirin (ASA) °Cimetidine (Tagamet) °Megestrol (Megace) °NSAIDs (ibuprofen, naproxen, etc.) °Piroxicam (Feldene) °Propafenone (Rythmol SR) °Propranolol (Inderal) °Isoniazid (INH) °Posaconazole (Noxafil) Barbiturates (Phenobarbital) °Carbamazepine (Tegretol) °Chlordiazepoxide (Librium) °Cholestyramine (Questran) °Griseofulvin °Oral Contraceptives °Rifampin °Sucralfate (Carafate) °Vitamin K  ° °Coumadin® (Warfarin) Major Herbal Interactions  °Increased Warfarin Effect Decreased Warfarin Effect  °Garlic °Ginseng °Ginkgo   biloba Coenzyme Q10 °Green tea °St. John’s wort   ° °Coumadin® (Warfarin) FOOD Interactions  °Eat a consistent number of servings per week of foods HIGH in Vitamin K °(1 serving = ½ cup)  °Collards (cooked, or boiled & drained) °Kale (cooked, or  boiled & drained) °Mustard greens (cooked, or boiled & drained) °Parsley *serving size only = ¼ cup °Spinach (cooked, or boiled & drained) °Swiss chard (cooked, or boiled & drained) °Turnip greens (cooked, or boiled & drained)  °Eat a consistent number of servings per week of foods MEDIUM-HIGH in Vitamin K °(1 serving = 1 cup)  °Asparagus (cooked, or boiled & drained) °Broccoli (cooked, boiled & drained, or raw & chopped) °Brussel sprouts (cooked, or boiled & drained) *serving size only = ½ cup °Lettuce, raw (green leaf, endive, romaine) °Spinach, raw °Turnip greens, raw & chopped  ° °These websites have more information on Coumadin (warfarin):  www.coumadin.com; °www.ahrq.gov/consumer/coumadin.htm; ° ° ° °

## 2015-11-12 NOTE — Progress Notes (Signed)
Advanced Heart Failure Rounding Note  Primary Care: Wende Neighbors, MD  Primary Cardiologist: Dr Harl Bowie   Subjective:    Admitted 11/03/15 with marked volume overload, found to be in cardiogenic shock with initial coox of 41%. Started on milrinone.   Has been stable off milrinone for several days, though coox remains marginal at 57%.  Overall he has diuresed 17.2 Liters and 25 lbs this admission.   He feels much better. Denies SOB or CP.  No lightheadedness or dizziness. Creatinine and K stable.   [facility] 11/11/15 RA = 1 RV = 24/1 PA = 26/11 (17) PCW = 9 Fick cardiac output/index = 5.5/2.9 PVR = 1.5 WU FA sat = 96% PA sat = 63%, 66% Ao = 93/59 (71) LV = 86/3/18  Assessment: 1. Severe 3v native CAD  --LAD & RCA occluded ostially  --LCX system and OMs with severe disease. Receive collaterals from ramus 2. LIMA to LAD occluded at anastomosis to LAD 3. SVG to RCA patent with severe disease in distal RCA after insertion 4. SVG x 2 unable to cannulate or visualize on root shot suspect occluded  Objective:   Weight Range: 150 lb 4.8 oz (68.176 kg) Body mass index is 20.97 kg/(m^2).   Vital Signs:   Temp:  [98 F (36.7 C)-98.6 F (37 C)] 98.2 F (36.8 C) (06/09 0435) Pulse Rate:  [0-93] 81 (06/09 0435) Resp:  [0-30] 26 (06/09 0435) BP: (91-107)/(54-83) 105/83 mmHg (06/09 0435) SpO2:  [0 %-100 %] 100 % (06/09 0435) Weight:  [150 lb 4.8 oz (68.176 kg)] 150 lb 4.8 oz (68.176 kg) (06/09 0435) Last BM Date: 11/09/15 (small BM)  Weight change: Filed Weights   11/10/15 0500 11/11/15 0614 11/12/15 0435  Weight: 148 lb 3.2 oz (67.223 kg) 148 lb 14.4 oz (67.541 kg) 150 lb 4.8 oz (68.176 kg)    Intake/Output:   Intake/Output Summary (Last 24 hours) at 11/12/15 0759 Last data filed at 11/12/15 0600  Gross per 24 hour  Intake 1062.45 ml  Output   1425 ml  Net -362.55 ml     Physical Exam: General: Elderly appearing. Fatigued appearing. Lying in bed.  HEENT:  normal Neck: supple. JVP 6-7. Carotids 2+ bilat; no bruits. No thyromegaly or nodule noted.  Cor: PMI nondisplaced. RRR. No rubs. 2/6 MR. + S3. Lungs: CTAB, normal effort Abdomen: soft, non-tender, non-distended, no HSM. No bruits or masses. +BS  Extremities: no cyanosis, clubbing, rash. Minimal ankle edema Neuro: alert & oriented x 3, cranial nerves grossly intact. moves all 4 extremities w/o difficulty. Affect pleasant.  Telemetry: Reviewed, NSR 70-80s. Occasional PVCs  Labs: CBC  Recent Labs  11/11/15 0430 11/12/15 0500  WBC 6.5 5.4  HGB 11.1* 10.6*  HCT 36.2* 34.8*  MCV 88.9 88.3  PLT 185 [id number]   Basic Metabolic Panel  Recent Labs  11/11/15 0430 11/12/15 0500  NA 134* 133*  K 3.7 4.2  CL 98* 100*  CO2 27 24  GLUCOSE 103* 161*  BUN 22* 20  CREATININE 0.98 1.03  CALCIUM 9.5 9.3   Liver Function Tests No results for input(s): AST, ALT, ALKPHOS, BILITOT, PROT, ALBUMIN in the last 72 hours. No results for input(s): LIPASE, AMYLASE in the last 72 hours. Cardiac Enzymes No results for input(s): CKTOTAL, CKMB, CKMBINDEX, TROPONINI in the last 72 hours.  BNP: BNP (last 3 results)  Recent Labs  10/07/15 1227 11/03/15 1455  BNP 2346.0* 2344.5*    ProBNP (last 3 results) No results for input(s): PROBNP in  the last 8760 hours.   D-Dimer No results for input(s): DDIMER in the last 72 hours. Hemoglobin A1C No results for input(s): HGBA1C in the last 72 hours. Fasting Lipid Panel No results for input(s): CHOL, HDL, LDLCALC, TRIG, CHOLHDL, LDLDIRECT in the last 72 hours. Thyroid Function Tests No results for input(s): TSH, T4TOTAL, T3FREE, THYROIDAB in the last 72 hours.  Invalid input(s): FREET3  Other results:     Imaging/Studies:  No results found.  Latest Echo  Latest Cath   Medications:     Scheduled Medications: . ferrous sulfate  325 mg Oral Q breakfast  . isosorbide-hydrALAZINE  1 tablet Oral TID  . Living Better with Heart Failure  Book   Does not apply Once  . sodium chloride flush  10-40 mL Intracatheter Q12H  . sodium chloride flush  3 mL Intravenous Q12H  . sodium chloride flush  3 mL Intravenous Q12H  . spironolactone  12.5 mg Oral Daily  . Warfarin - Pharmacist Dosing Inpatient   Does not apply q1800    Infusions: . heparin 1,200 Units/hr (11/11/15 2015)    PRN Medications: sodium chloride, sodium chloride, acetaminophen, ondansetron (ZOFRAN) IV, polyethylene glycol, RESOURCE THICKENUP CLEAR, sodium chloride flush, sodium chloride flush, sodium chloride flush, traMADol   Assessment   1. 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 CAD s/p CABG 3. HTN 4. ETOH abuse 5. Hx of CVA 6. Anemia, iron deficient  7. Hypokalemia   Plan    Now euvolemic  Coox 57% off milrinone x 48 hours. Evening rechecks of coox have been stable > 60%. Cardiac Index 2.9 on cath yesterday.   Iron stores markedly low 11/04/15. Got one dose Feraheme. Hgb improved after 2 UPRBCs ad continues to trend up. Anemia may be related to chronic disease with no overt bleeding.  He is overdue for colonoscopy. Hgb stable.   Continue Bidil 1 tab TID.  BP too low to titrate. No b-blocker due to low output. No ACE with low BP and recent AKI.   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   Home today.  Family wants to think about starting home milrinone now with clear benefit, or holding off and seeing how he does at home, with knowledge he will likely need it in the coming weeks to months. Will also discuss with MD.   Length of Stay: [place], [place] 11/12/2015, 7:59 AM  Advanced Heart Failure Team Pager [phone] (M-F; [place])  Please contact [facility] for night-coverage after hours (4p -7a ) and weekends on [email or web address]  Patient seen with PA, agree with the above note.  Cath yesterday  reviewed, severe CAD with no good interventional options, cardiac output preserved.  Plan to go home today, no milrinone.  He has resumed coumadin, will be followed in [place].  Needs close followup with [place] here. Will arrange.   Loralie Champagne 11/12/2015 9:23 AM

## 2015-11-13 ENCOUNTER — Other Ambulatory Visit: Payer: Self-pay

## 2015-11-13 ENCOUNTER — Telehealth: Payer: Self-pay | Admitting: Physician Assistant

## 2015-11-13 ENCOUNTER — Encounter (HOSPITAL_COMMUNITY): Payer: Self-pay | Admitting: Emergency Medicine

## 2015-11-13 ENCOUNTER — Inpatient Hospital Stay (HOSPITAL_COMMUNITY)
Admission: AD | Admit: 2015-11-13 | Discharge: 2015-11-20 | DRG: 026 | Disposition: A | Discharge status: Home / Self Care | Payer: Medicare Other | Source: Other Acute Inpatient Hospital | Attending: Neurosurgery | Admitting: Neurosurgery

## 2015-11-13 DIAGNOSIS — I255 Ischemic cardiomyopathy: Secondary | ICD-10-CM | POA: Diagnosis present

## 2015-11-13 DIAGNOSIS — F1010 Alcohol abuse, uncomplicated: Secondary | ICD-10-CM | POA: Diagnosis present

## 2015-11-13 DIAGNOSIS — M109 Gout, unspecified: Secondary | ICD-10-CM | POA: Diagnosis not present

## 2015-11-13 DIAGNOSIS — S065X0A Traumatic subdural hemorrhage without loss of consciousness, initial encounter: Secondary | ICD-10-CM | POA: Diagnosis not present

## 2015-11-13 DIAGNOSIS — Z87891 Personal history of nicotine dependence: Secondary | ICD-10-CM | POA: Diagnosis not present

## 2015-11-13 DIAGNOSIS — M10072 Idiopathic gout, left ankle and foot: Secondary | ICD-10-CM | POA: Diagnosis not present

## 2015-11-13 DIAGNOSIS — N189 Chronic kidney disease, unspecified: Secondary | ICD-10-CM | POA: Diagnosis not present

## 2015-11-13 DIAGNOSIS — Z86711 Personal history of pulmonary embolism: Secondary | ICD-10-CM | POA: Diagnosis not present

## 2015-11-13 DIAGNOSIS — I2510 Atherosclerotic heart disease of native coronary artery without angina pectoris: Secondary | ICD-10-CM | POA: Diagnosis not present

## 2015-11-13 DIAGNOSIS — Z95810 Presence of automatic (implantable) cardiac defibrillator: Secondary | ICD-10-CM | POA: Diagnosis not present

## 2015-11-13 DIAGNOSIS — E785 Hyperlipidemia, unspecified: Secondary | ICD-10-CM | POA: Diagnosis present

## 2015-11-13 DIAGNOSIS — I252 Old myocardial infarction: Secondary | ICD-10-CM | POA: Diagnosis not present

## 2015-11-13 DIAGNOSIS — I5022 Chronic systolic (congestive) heart failure: Secondary | ICD-10-CM | POA: Diagnosis not present

## 2015-11-13 DIAGNOSIS — Z951 Presence of aortocoronary bypass graft: Secondary | ICD-10-CM

## 2015-11-13 DIAGNOSIS — R791 Abnormal coagulation profile: Secondary | ICD-10-CM | POA: Diagnosis not present

## 2015-11-13 DIAGNOSIS — K7460 Unspecified cirrhosis of liver: Secondary | ICD-10-CM | POA: Diagnosis present

## 2015-11-13 DIAGNOSIS — I272 Other secondary pulmonary hypertension: Secondary | ICD-10-CM | POA: Diagnosis present

## 2015-11-13 DIAGNOSIS — Z7901 Long term (current) use of anticoagulants: Secondary | ICD-10-CM | POA: Diagnosis not present

## 2015-11-13 DIAGNOSIS — I959 Hypotension, unspecified: Secondary | ICD-10-CM | POA: Diagnosis present

## 2015-11-13 DIAGNOSIS — S065X9A Traumatic subdural hemorrhage with loss of consciousness of unspecified duration, initial encounter: Secondary | ICD-10-CM | POA: Diagnosis not present

## 2015-11-13 DIAGNOSIS — I619 Nontraumatic intracerebral hemorrhage, unspecified: Secondary | ICD-10-CM | POA: Diagnosis not present

## 2015-11-13 DIAGNOSIS — I509 Heart failure, unspecified: Secondary | ICD-10-CM | POA: Diagnosis not present

## 2015-11-13 DIAGNOSIS — Z8249 Family history of ischemic heart disease and other diseases of the circulatory system: Secondary | ICD-10-CM | POA: Diagnosis not present

## 2015-11-13 DIAGNOSIS — R52 Pain, unspecified: Secondary | ICD-10-CM

## 2015-11-13 DIAGNOSIS — I472 Ventricular tachycardia: Secondary | ICD-10-CM | POA: Diagnosis not present

## 2015-11-13 DIAGNOSIS — R51 Headache: Secondary | ICD-10-CM | POA: Diagnosis not present

## 2015-11-13 DIAGNOSIS — I6200 Nontraumatic subdural hemorrhage, unspecified: Secondary | ICD-10-CM

## 2015-11-13 DIAGNOSIS — D6489 Other specified anemias: Secondary | ICD-10-CM | POA: Diagnosis not present

## 2015-11-13 DIAGNOSIS — Z955 Presence of coronary angioplasty implant and graft: Secondary | ICD-10-CM | POA: Diagnosis not present

## 2015-11-13 DIAGNOSIS — Z7982 Long term (current) use of aspirin: Secondary | ICD-10-CM

## 2015-11-13 DIAGNOSIS — I5042 Chronic combined systolic (congestive) and diastolic (congestive) heart failure: Secondary | ICD-10-CM | POA: Diagnosis not present

## 2015-11-13 DIAGNOSIS — D509 Iron deficiency anemia, unspecified: Secondary | ICD-10-CM | POA: Diagnosis present

## 2015-11-13 DIAGNOSIS — I110 Hypertensive heart disease with heart failure: Secondary | ICD-10-CM | POA: Diagnosis not present

## 2015-11-13 DIAGNOSIS — R4182 Altered mental status, unspecified: Secondary | ICD-10-CM | POA: Diagnosis not present

## 2015-11-13 DIAGNOSIS — R42 Dizziness and giddiness: Secondary | ICD-10-CM | POA: Diagnosis not present

## 2015-11-13 DIAGNOSIS — I10 Essential (primary) hypertension: Secondary | ICD-10-CM | POA: Diagnosis not present

## 2015-11-13 DIAGNOSIS — I69392 Facial weakness following cerebral infarction: Secondary | ICD-10-CM

## 2015-11-13 DIAGNOSIS — I25810 Atherosclerosis of coronary artery bypass graft(s) without angina pectoris: Secondary | ICD-10-CM | POA: Diagnosis not present

## 2015-11-13 DIAGNOSIS — I493 Ventricular premature depolarization: Secondary | ICD-10-CM | POA: Diagnosis not present

## 2015-11-13 DIAGNOSIS — I130 Hypertensive heart and chronic kidney disease with heart failure and stage 1 through stage 4 chronic kidney disease, or unspecified chronic kidney disease: Secondary | ICD-10-CM | POA: Diagnosis not present

## 2015-11-13 DIAGNOSIS — N179 Acute kidney failure, unspecified: Secondary | ICD-10-CM | POA: Diagnosis not present

## 2015-11-13 DIAGNOSIS — Z950 Presence of cardiac pacemaker: Secondary | ICD-10-CM | POA: Diagnosis not present

## 2015-11-13 DIAGNOSIS — Z9989 Dependence on other enabling machines and devices: Secondary | ICD-10-CM | POA: Diagnosis not present

## 2015-11-13 DIAGNOSIS — G4730 Sleep apnea, unspecified: Secondary | ICD-10-CM | POA: Diagnosis present

## 2015-11-13 DIAGNOSIS — I6203 Nontraumatic chronic subdural hemorrhage: Secondary | ICD-10-CM | POA: Diagnosis not present

## 2015-11-13 DIAGNOSIS — S065XAA Traumatic subdural hemorrhage with loss of consciousness status unknown, initial encounter: Secondary | ICD-10-CM

## 2015-11-13 LAB — URINALYSIS, ROUTINE W REFLEX MICROSCOPIC
BILIRUBIN URINE: NEGATIVE
GLUCOSE, UA: NEGATIVE mg/dL
Hgb urine dipstick: NEGATIVE
KETONES UR: NEGATIVE mg/dL
Leukocytes, UA: NEGATIVE
Nitrite: NEGATIVE
PH: 5 (ref 5.0–8.0)
Protein, ur: NEGATIVE mg/dL
SPECIFIC GRAVITY, URINE: 1.01 (ref 1.005–1.030)

## 2015-11-13 LAB — COMPREHENSIVE METABOLIC PANEL
ALBUMIN: 3 g/dL — AB (ref 3.5–5.0)
ALK PHOS: 107 U/L (ref 38–126)
ALT: 13 U/L — AB (ref 17–63)
AST: 22 U/L (ref 15–41)
Anion gap: 10 (ref 5–15)
BUN: 33 mg/dL — AB (ref 6–20)
CALCIUM: 9.3 mg/dL (ref 8.9–10.3)
CHLORIDE: 100 mmol/L — AB (ref 101–111)
CO2: 23 mmol/L (ref 22–32)
CREATININE: 1.17 mg/dL (ref 0.61–1.24)
GFR calc Af Amer: >60 mL/min (ref >60)
GFR calc non Af Amer: >60 mL/min (ref >60)
GLUCOSE: 128 mg/dL — AB (ref 65–99)
Potassium: 3.9 mmol/L (ref 3.5–5.1)
SODIUM: 133 mmol/L — AB (ref 135–145)
Total Bilirubin: 2.2 mg/dL — ABNORMAL HIGH (ref 0.3–1.2)
Total Protein: 7.4 g/dL (ref 6.5–8.1)

## 2015-11-13 LAB — CBC WITH DIFFERENTIAL/PLATELET
BASOS ABS: 0 10*3/uL (ref 0.0–0.1)
BASOS PCT: 0 %
EOS ABS: 0.1 10*3/uL (ref 0.0–0.7)
Eosinophils Relative: 2 %
HCT: 35.9 % — ABNORMAL LOW (ref 39.0–52.0)
HEMOGLOBIN: 10.9 g/dL — AB (ref 13.0–17.0)
LYMPHS ABS: 1.3 10*3/uL (ref 0.7–4.0)
Lymphocytes Relative: 24 %
MCH: 27.3 pg (ref 26.0–34.0)
MCHC: 30.4 g/dL (ref 30.0–36.0)
MCV: 89.8 fL (ref 78.0–100.0)
Monocytes Absolute: 0.8 10*3/uL (ref 0.1–1.0)
Monocytes Relative: 15 %
NEUTROS PCT: 59 %
Neutro Abs: 3 10*3/uL (ref 1.7–7.7)
Platelets: 175 10*3/uL (ref 150–400)
RBC: 4 MIL/uL — AB (ref 4.22–5.81)
RDW: 20 % — ABNORMAL HIGH (ref 11.5–15.5)
WBC: 5.2 10*3/uL (ref 4.0–10.5)

## 2015-11-13 MED ORDER — SPIRONOLACTONE 25 MG PO TABS
12.5000 mg | ORAL_TABLET | Freq: Every day | ORAL | Status: DC
  Administered 2015-11-14 – 2015-11-20 (×7): 12.5 mg via ORAL
  Filled 2015-11-13 (×3): qty 1
  Filled 2015-11-13 (×2): qty 0.5
  Filled 2015-11-13: qty 1
  Filled 2015-11-13: qty 0.5

## 2015-11-13 MED ORDER — SENNA 8.6 MG PO TABS
1.0000 | ORAL_TABLET | Freq: Two times a day (BID) | ORAL | Status: DC
  Administered 2015-11-14 – 2015-11-20 (×12): 8.6 mg via ORAL
  Filled 2015-11-13 (×13): qty 1

## 2015-11-13 MED ORDER — OXYCODONE HCL 5 MG PO TABS
5.0000 mg | ORAL_TABLET | ORAL | Status: DC | PRN
  Administered 2015-11-16 – 2015-11-19 (×4): 5 mg via ORAL
  Filled 2015-11-13 (×4): qty 1

## 2015-11-13 MED ORDER — METOLAZONE 2.5 MG PO TABS: 2.5000 mg | ORAL_TABLET | Freq: Every day | ORAL | Status: DC | PRN

## 2015-11-13 MED ORDER — SODIUM CHLORIDE 0.9 % IV BOLUS (SEPSIS)
250.0000 mL | Freq: Once | INTRAVENOUS | Status: AC
  Administered 2015-11-13: 250 mL via INTRAVENOUS

## 2015-11-13 MED ORDER — ASPIRIN EC 81 MG PO TBEC: 81.0000 mg | DELAYED_RELEASE_TABLET | Freq: Every day | ORAL | Status: DC | PRN

## 2015-11-13 MED ORDER — ONDANSETRON HCL 4 MG/2ML IJ SOLN: 4.0000 mg | Freq: Four times a day (QID) | INTRAMUSCULAR | Status: DC | PRN

## 2015-11-13 MED ORDER — ACETAMINOPHEN 325 MG PO TABS
650.0000 mg | ORAL_TABLET | Freq: Four times a day (QID) | ORAL | Status: DC | PRN
  Administered 2015-11-15 – 2015-11-19 (×3): 650 mg via ORAL
  Filled 2015-11-13 (×3): qty 2

## 2015-11-13 MED ORDER — POLYETHYLENE GLYCOL 3350 17 G PO PACK
17.0000 g | PACK | Freq: Every day | ORAL | Status: DC | PRN
  Administered 2015-11-14 – 2015-11-19 (×2): 17 g via ORAL
  Filled 2015-11-13 (×2): qty 1

## 2015-11-13 MED ORDER — SODIUM CHLORIDE 0.9% FLUSH: 3.0000 mL | INTRAVENOUS | Status: DC | PRN

## 2015-11-13 MED ORDER — FERROUS SULFATE 325 (65 FE) MG PO TABS
325.0000 mg | ORAL_TABLET | Freq: Every day | ORAL | Status: DC
  Administered 2015-11-14 – 2015-11-20 (×7): 325 mg via ORAL
  Filled 2015-11-13 (×8): qty 1

## 2015-11-13 MED ORDER — POTASSIUM CHLORIDE CRYS ER 20 MEQ PO TBCR: 20.0000 meq | EXTENDED_RELEASE_TABLET | ORAL | Status: DC | PRN

## 2015-11-13 MED ORDER — ACETAMINOPHEN 650 MG RE SUPP: 650.0000 mg | Freq: Four times a day (QID) | RECTAL | Status: DC | PRN

## 2015-11-13 MED ORDER — SODIUM CHLORIDE 0.9 % IV SOLN
250.0000 mL | INTRAVENOUS | Status: DC | PRN
  Administered 2015-11-14: 250 mL via INTRAVENOUS

## 2015-11-13 MED ORDER — ONDANSETRON HCL 4 MG PO TABS: 4.0000 mg | ORAL_TABLET | Freq: Four times a day (QID) | ORAL | Status: DC | PRN

## 2015-11-13 MED ORDER — ISOSORB DINITRATE-HYDRALAZINE 20-37.5 MG PO TABS
1.0000 | ORAL_TABLET | Freq: Three times a day (TID) | ORAL | Status: DC
  Administered 2015-11-14: 1 via ORAL
  Filled 2015-11-13 (×2): qty 1

## 2015-11-13 MED ORDER — TORSEMIDE 20 MG PO TABS
40.0000 mg | ORAL_TABLET | Freq: Two times a day (BID) | ORAL | Status: DC
  Filled 2015-11-13: qty 2

## 2015-11-13 MED ORDER — TRAMADOL HCL 50 MG PO TABS
50.0000 mg | ORAL_TABLET | Freq: Four times a day (QID) | ORAL | Status: DC | PRN
  Administered 2015-11-14 – 2015-11-18 (×3): 50 mg via ORAL
  Filled 2015-11-13 (×3): qty 1

## 2015-11-13 MED ORDER — SODIUM CHLORIDE 0.9% FLUSH
3.0000 mL | Freq: Two times a day (BID) | INTRAVENOUS | Status: DC
  Administered 2015-11-14 – 2015-11-20 (×12): 3 mL via INTRAVENOUS

## 2015-11-13 NOTE — ED Notes (Signed)
MD at bedside. Cardiology 

## 2015-11-13 NOTE — Consult Note (Signed)
Primary Cardiologist: [place] HF team   Referred by; Dr. Christella Noa (Neurosurgery)   Reason: Chronic sytolic HF  HPI: 70 y.o. AA man with history of ischemic cardiomyopathy, s/p CABG in 2003 and prior stenting, LVEF 15-20% by echo in 10/2015,  S/p [employer] single chamber ICD in 05/2012, on coumadin for prior PE/CVA per records, was recently admitted to [facility] 5/31-6/9 (DC summary reviewed)  presented to outside ER with c/o dizziness and the note of low BP in 80's by home nurse. No CP, SOB, syncope. He reported some nausea. Also reports headache off and on for the past week or so. CT head at the outside facility revealed subdural hematoma. Therefore transferred to neurosurgery service here (Dr. Christella Noa).    At present time, he denies any pain, SOB, fever, chills, N/V, palpitations. Diaphoresis, dizziness.    Review of Systems:  10 systems reviewed unremarkable except as noted in HPI     Past Medical History  Diagnosis Date  . Essential hypertension   . Ischemic cardiomyopathy     EF 20% by echo 2013  . Other pulmonary embolism and infarction     On coumadin  . Hyperlipidemia   . DOE (dyspnea on exertion)   . CAD (coronary artery disease)   . Anxiety   . Tricuspid regurgitation     Severe  . Pulmonary hypertension (HCC)     56 mm Hg  . AICD (automatic cardioverter/defibrillator) present   . Family history of adverse reaction to anesthesia     "sister would go into seizures"  . CHF (congestive heart failure) ([place])   . Myocardial infarction acute ([place]) 2003  . Sleep apnea     "tried machine a couple nights; couldn't do it" (11/03/2015)  . 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 Stroke [facility])     left side facial droop since; don't know when it was" (11/03/2015)     (Not in a hospital admission)   . [START ON 11/14/2015] ferrous sulfate  325 mg Oral Q breakfast  . isosorbide-hydrALAZINE  1 tablet Oral TID  . senna  1 tablet Oral BID  . sodium chloride  flush  3 mL Intravenous Q12H  . spironolactone  12.5 mg Oral Daily  . [START ON 11/14/2015] torsemide  40 mg Oral BID    Infusions: . sodium chloride      No Known Allergies  Social History   Social History  . Marital Status: Married    Spouse Name: N/A  . Number of Children: 4  . Years of Education: N/A   Occupational History  . Disabled.    Social History Main Topics  . Smoking status: Former Smoker -- 0.50 packs/day for 38 years    Types: Cigarettes    Start date: 12/30/1956    Quit date: 06/06/1995  . Smokeless tobacco: Never Used  . Alcohol Use: 1.8 oz/week    3 Cans of beer, 0 Standard drinks or equivalent per week     Comment: 11/03/2015 "Quit from [id number]; weaning myself off again"  . Drug Use: No  . Sexual Activity: Not Currently   Other Topics Concern  . Not on file   Social History Narrative   Lives with wife and brother in [place] and grandson in [place].      Family History  Problem Relation Age of Onset  . Heart disease Mother     CAD  . Leukemia Father     PHYSICAL EXAM: Filed Vitals:  11/13/15 2105 11/13/15 2115  BP: 81/56 96/69  Pulse: 76 65  Temp:    Resp: 16 13    No intake or output data in the 24 hours ending 11/13/15 2234  General:  No acute distress. Awake and alert.  HEENT: normal Neck: supple. no JVD. Carotids 2+ bilat; no bruits. No lymphadenopathy or thryomegaly appreciated. Cor: PMI displaced. Regular rate & rhythm. No rubs, gallops or murmurs. Lungs: clear Abdomen: soft, nontender, nondistended. No hepatosplenomegaly. No bruits or masses. Good bowel sounds. Extremities: no cyanosis, clubbing, rash, edema Neuro: alert & oriented x 3, cranial nerves grossly intact. moves all 4 extremities w/o difficulty. Affect pleasant.  ECG: SR, RBBB+LAFB  No results found for this or any previous visit (from the past 24 hour(s)). No results found.   ASSESSMENT:  1. Subdural hematoma  2, Severe CAD, occluded LIMA and 2  SVG, only one SVG patent by recent cath, ischemic CM, chronic systolic HF, NYHA-III, s/p VVI ICD in 05/2012 - Chronically low BP  - No evidence of acute HF exacerbation or ACS  3. Chronic coumadin use for CVA/PE as per records - No documented history of AF; no history of mechanical valves   PLAN/DISCUSSION:  Given subdural hematoma, agree with holding coumadin for now  Continue EC ASA 81 mg po qd if ok from neurosurgery stand point  Monitor electrolytes   Will follow   Wandra Mannan, MD  Cardiology

## 2015-11-13 NOTE — ED Notes (Signed)
Pt given apple sauce

## 2015-11-13 NOTE — ED Notes (Signed)
Pt brought in via EMS, transfer from [facility] ED for subdural hematoma. Pt reports feeling dizzy at around noon today. Reports of N/V. Pt was just released from hospital yesterday for CHF. States he fell at home prior to hospital stay. Vitals sign abnormal. Pt hypotensive, low ejection fraction. Received 1100 normal saline via EMS. BP 100/62 Lung sounds clear. No SOB. Reports of headache.

## 2015-11-13 NOTE — ED Provider Notes (Addendum)
CSN: [id number]     Arrival date & time 11/13/15  1954 History   None    Chief Complaint  Patient presents with  . Fall     (Consider location/radiation/quality/duration/timing/severity/associated sxs/prior Treatment) HPI  70 year old male with a history of ischemic cardiomyopathy status post CABG in 2003, EF of 25%, ICD placement, PE on coumadin, recent admission for concern of CHF exacerbation for which patient was placed on milrinone, and given diuretics with successful mean of milrinone prior to time of discharge, presents as transfer from [facility] for concern of subdural hematoma. 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 Patient denies any new neurologic symptoms, however reports intermittent headaches over the last week. Headache x1 week. Lightheadedness began today. Near syncopal episode today.  No SOB/no fevers/no cough. Reports chronic difficulty urinating which is uncahnged from baseline.    Past Medical History  Diagnosis Date  . Essential hypertension   . Ischemic cardiomyopathy     EF 20% by echo 2013  . Other pulmonary embolism and infarction     On coumadin  . Hyperlipidemia   . DOE (dyspnea on exertion)   . CAD (coronary artery disease)   . Anxiety   . Tricuspid regurgitation     Severe  . Pulmonary hypertension (HCC)     56 mm Hg  . AICD (automatic cardioverter/defibrillator) present   . Family history of adverse reaction to anesthesia     "sister would go into seizures"  . CHF (congestive heart failure) ([place])   . Myocardial infarction acute ([place]) 2003  . Sleep apnea     "tried machine a couple nights; couldn't do it" (11/03/2015)  . 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 Stroke [facility])     left side facial droop since; don't know when it was" (11/03/2015)   Past Surgical History  Procedure Laterality Date  . Coronary artery bypass  graft  2003    4 vessel  . Cardiac defibrillator placement  11/09/06    SJM Epic II DR ICD implanted in [place], atrial lead no longer functions due to low impedance  . Implantable cardioverter defibrillator generator change  05/27/2012    Gen change.  SJM Assura VR.  Riata V lead  . Colonoscopy with esophagogastroduodenoscopy (egd)  06/06/2012    Procedure: COLONOSCOPY WITH ESOPHAGOGASTRODUODENOSCOPY (EGD);  Surgeon: Rogene Houston, MD;  [facility];  Service: Endoscopy;  Laterality: N/A;  310  . Implantable cardioverter defibrillator (icd) generator change N/A 05/27/2012    Procedure: ICD GENERATOR CHANGE;  Surgeon: Thompson Grayer, MD;  Location: [facility] CATH LAB;  Service: Cardiovascular;  Laterality: N/A;  . Coronary angioplasty with stent placement  "<2003  . Cardiac catheterization N/A 11/11/2015    Procedure: Right/Left Heart Cath and Coronary Angiography;  Surgeon: Jolaine Artist, MD;  Location: [place] CV LAB;  Service: Cardiovascular;  Laterality: N/A;   Family History  Problem Relation Age of Onset  . Heart disease Mother     CAD  . Leukemia Father    Social History  Substance Use Topics  . Smoking status: Former Smoker -- 0.50 packs/day for 38 years    Types: Cigarettes    Start date: 12/30/1956    Quit date: 06/06/1995  . Smokeless tobacco: Never Used  . Alcohol Use: 1.8 oz/week    3 Cans of beer, 0 Standard drinks or equivalent per week  Comment: 11/03/2015 "Quit from [id number]; weaning myself off again"    Review of Systems  Constitutional: Positive for fatigue. Negative for fever.  HENT: Negative for sore throat.   Eyes: Negative for visual disturbance.  Respiratory: Negative for cough and shortness of breath.   Cardiovascular: Negative for chest pain.  Gastrointestinal: Negative for abdominal pain and blood in stool.  Genitourinary: Positive for difficulty urinating (chronic).  Musculoskeletal: Negative for back pain and neck stiffness.  Skin:  Negative for rash.  Neurological: Positive for light-headedness and headaches. Negative for syncope.      Allergies  Review of patient's allergies indicates no known allergies.  Home Medications   Prior to Admission medications   Medication Sig Start Date End Date Taking? Authorizing Provider  aspirin 81 MG tablet Take 81 mg by mouth daily as needed for pain.   Yes Historical Provider, MD  ferrous sulfate 325 (65 FE) MG tablet Take 325 mg by mouth daily with breakfast.   Yes Historical Provider, MD  isosorbide-hydrALAZINE (BIDIL) 20-37.5 MG tablet Take 1 tablet by mouth 3 (three) times daily. 11/12/15  Yes Shirley Friar, PA-C  metolazone (ZAROXOLYN) 5 MG tablet Take 0.5 tablets (2.5 mg total) by mouth as needed. For weight gain of 3 lbs overnight or 5 lbs within one week. 11/12/15  Yes Shirley Friar, PA-C  polyethylene glycol [facility] / GLYCOLAX) packet Take 17 g by mouth daily as needed for mild constipation.    Yes Historical Provider, MD  potassium chloride SA (K-DUR,KLOR-CON) 20 MEQ tablet Take 20 mEq by mouth as needed (hand cramping).   Yes Historical Provider, MD  spironolactone (ALDACTONE) 25 MG tablet Take 0.5 tablets (12.5 mg total) by mouth daily. 11/12/15  Yes Shirley Friar, PA-C  torsemide (DEMADEX) 20 MG tablet Take 2 tablets (40 mg total) by mouth 2 (two) times daily. 11/12/15  Yes Shirley Friar, PA-C  warfarin (COUMADIN) 3 MG tablet Take 2 tablets (6 mg) 11/12/15 and 11/13/15, and then resume 1 tablet (3 mg) daily until next coumadin check Patient taking differently: Take 3-6 mg by mouth daily at 6 PM. Take 2 tablets (6 mg) 11/12/15 and 11/13/15, and then resume 1 tablet (3 mg) daily until next coumadin check 11/12/15  Yes Shirley Friar, PA-C  traMADol (ULTRAM) 50 MG tablet Take 50 mg by mouth every 6 (six) hours as needed for moderate pain.     Historical Provider, MD   BP 77/54 mmHg  Pulse 60  Temp(Src) 97.9 F (36.6 C) (Oral)  Resp 13   SpO2 100% Physical Exam  Constitutional: He is oriented to person, place, and time. He appears well-developed and well-nourished. No distress.  HENT:  Head: Normocephalic and atraumatic.  Eyes: Conjunctivae and EOM are normal.  Neck: Normal range of motion.  Cardiovascular: Normal rate, regular rhythm, normal heart sounds and intact distal pulses.  Exam reveals no gallop and no friction rub.   No murmur heard. Pulses:      Dorsalis pedis pulses are 1+ on the right side, and 1+ on the left side.  Pulmonary/Chest: Effort normal and breath sounds normal. No respiratory distress. He has no wheezes. He has no rales.  Abdominal: Soft. He exhibits no distension. There is no tenderness. There is no guarding.  Musculoskeletal: He exhibits no edema.  Neurological: He is alert and oriented to person, place, and time. He has normal strength. A cranial nerve deficit (chronic left sided facial droop) is present. No sensory deficit. Coordination normal. GCS eye  subscore is 4. GCS verbal subscore is 5. GCS motor subscore is 6.  Skin: Skin is warm and dry. He is not diaphoretic.  Nursing note and vitals reviewed.   ED Course  Procedures (including critical care time) Labs Review Labs Reviewed  CBC WITH DIFFERENTIAL/PLATELET - Abnormal; Notable for the following:    RBC 4.00 (*)    Hemoglobin 10.9 (*)    HCT 35.9 (*)    RDW 20.0 (*)    All other components within normal limits  COMPREHENSIVE METABOLIC PANEL - Abnormal; Notable for the following:    Sodium 133 (*)    Chloride 100 (*)    Glucose, Bld 128 (*)    BUN 33 (*)    Albumin 3.0 (*)    ALT 13 (*)    Total Bilirubin 2.2 (*)    All other components within normal limits  URINE CULTURE  URINALYSIS, ROUTINE W REFLEX MICROSCOPIC (NOT AT [facility])  APTT  PROTIME-INR    Imaging Review No results found. I have personally reviewed and evaluated these images and lab results as part of my medical decision-making.   EKG Interpretation None       MDM   Final diagnoses:  Chronic combined systolic and diastolic CHF (congestive heart failure) (HCC)  Subdural hematoma   70 year old male with a history of ischemic cardiomyopathy status post CABG in 2003, EF of 25%, ICD placement, PE on coumadin, recent admission for concern of CHF exacerbation for which patient was placed on milrinone, and given diuretics with successful mean of milrinone prior to time of discharge, presents as transfer from [facility] for concern of subdural hematoma. 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 Patient denies any new neurologic symptoms, however reports intermittent headaches over the last week.  PTINR subtherapeutic yesterday and pending today.  Dr. Cyndy Freeze was consulted at [facility], and came to bedside on patient's arrival to the emergency department for evaluation. Dr. Cyndy Freeze will admit the patient for further care. He is consulted cardiology, who also evaluated patient. He is noted to have low blood pressures, however reports baseline blood pressures in the 90s, and feel his hypotension is likely secondary to chronic congestive heart failure. Patient was given 250 mL bolus of normal saline. Cardiac cardiology reports that milrinone had been discussed for patient in the past, however do not feel indicated at this time. Patient denies any infection, no history to suggest acute GI bleed, and have low suspicion for other etiology of patient's low blood pressures which appear to be at or near baseline other than chronic CHF and possible hypovolemia in setting of recent diuresis.    Gareth Morgan, MD 11/14/15 [id number]  Gareth Morgan, MD 11/14/15 [id number]

## 2015-11-13 NOTE — H&P (Signed)
Rick [name] is an 70 y.o. male.   Chief Complaint: subdural hematoma, congestive heart failure HPI: whom was discharged from [facility] yesterday for congestive heart failure. Taken to the ED in [place] where he was hypotensive,normal neurologically. Head CT performed due to history revealed a left chronic subdural hematoma ~1cm panhemispheric with left to right midline shift. No uncal herniation, normal exam. Transferred for management of subdural hematoma.  Past Medical History  Diagnosis Date  . Essential hypertension   . Ischemic cardiomyopathy     EF 20% by echo 2013  . Other pulmonary embolism and infarction     On coumadin  . Hyperlipidemia   . DOE (dyspnea on exertion)   . CAD (coronary artery disease)   . Anxiety   . Tricuspid regurgitation     Severe  . Pulmonary hypertension (HCC)     56 mm Hg  . AICD (automatic cardioverter/defibrillator) present   . Family history of adverse reaction to anesthesia     "sister would go into seizures"  . CHF (congestive heart failure) ([place])   . Myocardial infarction acute ([place]) 2003  . Sleep apnea     "tried machine a couple nights; couldn't do it" (11/03/2015)  . 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 Stroke [facility])     left side facial droop since; don't know when it was" (11/03/2015)    Past Surgical History  Procedure Laterality Date  . Coronary artery bypass graft  2003    4 vessel  . Cardiac defibrillator placement  11/09/06    SJM Epic II DR ICD implanted in [place], atrial lead no longer functions due to low impedance  . Implantable cardioverter defibrillator generator change  05/27/2012    Gen change.  SJM Assura VR.  Riata V lead  . Colonoscopy with esophagogastroduodenoscopy (egd)  06/06/2012    Procedure: COLONOSCOPY WITH ESOPHAGOGASTRODUODENOSCOPY (EGD);  Surgeon: Rogene Houston, MD;  Location[facility];  Service: Endoscopy;  Laterality: N/A;  310  . Implantable cardioverter defibrillator (icd) generator change  N/A 05/27/2012    Procedure: ICD GENERATOR CHANGE;  Surgeon: Thompson Grayer, MD;  Location: [facility] CATH LAB;  Service: Cardiovascular;  Laterality: N/A;  . Coronary angioplasty with stent placement  "<2003  . Cardiac catheterization N/A 11/11/2015    Procedure: Right/Left Heart Cath and Coronary Angiography;  Surgeon: Jolaine Artist, MD;  Location: [place] CV LAB;  Service: Cardiovascular;  Laterality: N/A;    Family History  Problem Relation Age of Onset  . Heart disease Mother     CAD  . Leukemia Father    Social History:  reports that he quit smoking about 20 years ago. His smoking use included Cigarettes. He started smoking about 58 years ago. He has a 19 pack-year smoking history. He has never used smokeless tobacco. He reports that he drinks about 1.8 oz of alcohol per week. He reports that he does not use illicit drugs.  Allergies: No Known Allergies   (Not in a hospital admission)  Results for orders placed or performed during the hospital encounter of 11/03/15 (from the past 48 hour(s))  Heparin level (unfractionated)     Status: None   Collection Time: 11/12/15  5:00 AM  Result Value Ref Range   Heparin Unfractionated 0.41 0.30 - 0.70 IU/mL    Comment:        IF HEPARIN RESULTS ARE BELOW EXPECTED VALUES, AND PATIENT DOSAGE HAS BEEN CONFIRMED, SUGGEST FOLLOW UP TESTING OF ANTITHROMBIN III LEVELS.  Basic metabolic panel     Status: Abnormal   Collection Time: 11/12/15  5:00 AM  Result Value Ref Range   Sodium 133 (L) 135 - 145 mmol/L   Potassium 4.2 3.5 - 5.1 mmol/L   Chloride 100 (L) 101 - 111 mmol/L   CO2 24 22 - 32 mmol/L   Glucose, Bld 161 (H) 65 - 99 mg/dL   BUN 20 6 - 20 mg/dL   Creatinine, Ser 1.03 0.61 - 1.24 mg/dL   Calcium 9.3 8.9 - 10.3 mg/dL   GFR calc non Af Amer >60 >60 mL/min   GFR calc Af Amer >60 >60 mL/min    Comment: (NOTE) The eGFR has been calculated using the CKD EPI equation. This calculation has not been validated in all clinical  situations. eGFR's persistently <60 mL/min signify possible Chronic Kidney Disease.    Anion gap 9 5 - 15  CBC     Status: Abnormal   Collection Time: 11/12/15  5:00 AM  Result Value Ref Range   WBC 5.4 4.0 - 10.5 K/uL   RBC 3.94 (L) 4.22 - 5.81 MIL/uL   Hemoglobin 10.6 (L) 13.0 - 17.0 g/dL   HCT 34.8 (L) 39.0 - 52.0 %   MCV 88.3 78.0 - 100.0 fL   MCH 26.9 26.0 - 34.0 pg   MCHC 30.5 30.0 - 36.0 g/dL   RDW 20.4 (H) 11.5 - 15.5 %   Platelets 186 150 - 400 K/uL  Protime-INR     Status: Abnormal   Collection Time: 11/12/15  5:00 AM  Result Value Ref Range   Prothrombin Time 20.2 (H) 11.6 - 15.2 seconds   INR 1.73 (H) 0.00 - 1.49  Carboxyhemoglobin     Status: Abnormal   Collection Time: 11/12/15  5:04 AM  Result Value Ref Range   Total hemoglobin 11.1 (L) 13.5 - 18.0 g/dL   O2 Saturation 57.7 %   Carboxyhemoglobin 1.7 (H) 0.5 - 1.5 %   Methemoglobin 0.8 0.0 - 1.5 %   No results found.  Review of Systems  Constitutional: Negative.   Eyes: Negative.   Respiratory: Negative.   Gastrointestinal: Negative.   Genitourinary: Negative.   Musculoskeletal: Negative.   Skin: Negative.   Neurological: Positive for headaches.  Endo/Heme/Allergies: Negative.   Psychiatric/Behavioral: Negative.     Blood pressure 95/69, pulse 59, temperature 97.6 F (36.4 C), temperature source Oral, resp. rate 16, SpO2 100 %. Physical Exam  Constitutional: He is oriented to person, place, and time. He appears well-developed and well-nourished. No distress.  HENT:  Head: Normocephalic and atraumatic.  Right Ear: External ear normal.  Left Ear: External ear normal.  Nose: Nose normal.  Mouth/Throat: Oropharynx is clear and moist.  Eyes: Conjunctivae and EOM are normal. Pupils are equal, round, and reactive to light.  Neck: Normal range of motion. Neck supple.  Respiratory: Effort normal and breath sounds normal.  GI: Soft. Bowel sounds are normal.  Musculoskeletal: Normal range of motion.   Neurological: He is alert and oriented to person, place, and time. He has normal reflexes. He displays no atrophy and normal reflexes. No cranial nerve deficit or sensory deficit. He exhibits normal muscle tone. Coordination normal.  Skin: Skin is warm and dry. He is not diaphoretic.  Psychiatric: He has a normal mood and affect. His behavior is normal. Thought content normal.     Assessment/Plan Admit to heart unit for observation. Currently does not need operative intervention. Will allow cardiology to manage fluids. Little if any  acute blood present on scan. Essentially asymptomatic from subdural. Will check ct tomorrow. He obviously had subdural while an inpatient at [facility], had multiple exams and no one suspected a subdural hematoma.  Will consult cardiology  Georgenia Salim L, MD 11/13/2015, 8:49 PM

## 2015-11-13 NOTE — Telephone Encounter (Signed)
Rick [name] is a 70 y.o. male it was just discharged yesterday after an admission for acute on chronic systolic heart failure. He is now in the [facility] emergency room due to symptomatic hypotension. Blood pressure was apparently 80/52 when checked by the home health nurse. His daughter called with concerns that the ER physician in [place] is giving him IV fluids. I reassured her that this is sometimes necessary when someone is hypotensive. I advised her to make sure she tells the doctors in [place] that he has congestive heart failure. I have also encouraged her to tell the doctors in [place] who his cardiologists are. If he needs to be transferred to [facility] the attending physician in [place] will need to contact our attending physician here. Richardson Dopp, PA-C   11/13/2015 4:13 PM

## 2015-11-14 ENCOUNTER — Encounter (HOSPITAL_COMMUNITY)
Admission: AD | Disposition: A | Discharge status: Home / Self Care | Payer: Self-pay | Source: Other Acute Inpatient Hospital | Attending: Neurosurgery

## 2015-11-14 ENCOUNTER — Inpatient Hospital Stay (HOSPITAL_COMMUNITY): Payer: Medicare Other

## 2015-11-14 DIAGNOSIS — I25810 Atherosclerosis of coronary artery bypass graft(s) without angina pectoris: Secondary | ICD-10-CM

## 2015-11-14 DIAGNOSIS — I5042 Chronic combined systolic (congestive) and diastolic (congestive) heart failure: Secondary | ICD-10-CM | POA: Insufficient documentation

## 2015-11-14 LAB — PROTIME-INR
INR: 1.51 — AB (ref 0.00–1.49)
INR: 1.27 (ref 0.00–1.49)
PROTHROMBIN TIME: 16.1 s — AB (ref 11.6–15.2)
Prothrombin Time: 18.3 seconds — ABNORMAL HIGH (ref 11.6–15.2)

## 2015-11-14 LAB — APTT: aPTT: 36 seconds (ref 24–37)

## 2015-11-14 SURGERY — CREATION, CRANIAL BURR HOLE
Anesthesia: General | Laterality: Left

## 2015-11-14 MED ORDER — TORSEMIDE 20 MG PO TABS
20.0000 mg | ORAL_TABLET | Freq: Two times a day (BID) | ORAL | Status: DC
  Administered 2015-11-14 – 2015-11-19 (×9): 20 mg via ORAL
  Filled 2015-11-14 (×11): qty 1

## 2015-11-14 MED ORDER — MILRINONE LACTATE IN DEXTROSE 20-5 MG/100ML-% IV SOLN
0.1250 ug/kg/min | INTRAVENOUS | Status: DC
  Administered 2015-11-14 – 2015-11-16 (×2): 0.125 ug/kg/min via INTRAVENOUS
  Filled 2015-11-14 (×2): qty 100

## 2015-11-14 MED ORDER — ISOSORB DINITRATE-HYDRALAZINE 20-37.5 MG PO TABS
0.5000 | ORAL_TABLET | Freq: Three times a day (TID) | ORAL | Status: DC
  Administered 2015-11-14 – 2015-11-20 (×17): 0.5 via ORAL
  Filled 2015-11-14 (×2): qty 0.5
  Filled 2015-11-14: qty 1
  Filled 2015-11-14 (×5): qty 0.5
  Filled 2015-11-14 (×2): qty 1
  Filled 2015-11-14 (×5): qty 0.5
  Filled 2015-11-14: qty 1
  Filled 2015-11-14 (×3): qty 0.5

## 2015-11-14 MED ORDER — PROTHROMBIN COMPLEX CONC HUMAN 500 UNITS IV KIT
1626.0000 [IU] | PACK | Status: AC
  Administered 2015-11-14: 1626 [IU] via INTRAVENOUS
  Filled 2015-11-14: qty 65

## 2015-11-14 MED ORDER — VITAMIN K1 10 MG/ML IJ SOLN
10.0000 mg | INTRAVENOUS | Status: AC
  Administered 2015-11-14: 10 mg via INTRAVENOUS
  Filled 2015-11-14: qty 1

## 2015-11-14 NOTE — Progress Notes (Signed)
Patient ID: Rick [name], male   DOB: December 05, 1945, 70 y.o.   MRN: [id number] BP 80/51 mmHg  Pulse 94  Temp(Src) 98.5 F (36.9 C) (Oral)  Resp 15  Ht 5\' 11"  (1.803 m)  Wt 68.4 kg (150 lb 12.7 oz)  BMI 21.04 kg/m2  SpO2 99% Alert and oriented x 4, speech is clear and fluent [place], full eom No drift on exam Symmetric facial movements and sensation.  Repeat CT reviewed. Rick [name] states that his headache is persistent, and troublesome. He has approximately 28mm shift.  Cardiology states he is a reasonable risk for a general anesthetic. With this information Rick [name] stated he would like to proceed with chronic subacute subdural hematoma evacuation via burr holes. I will plan on scheduling this today. I will put him through the reversal protocol. He did eat at 1030 this am, so surgery could possibly be done at 1830 tonight. I will speak with his family when they arrive later this afternoon.

## 2015-11-14 NOTE — Progress Notes (Signed)
  Patient going to OR for burr hole craniotomy. Will start low-dose milrinone for cardiac support.   Bensimhon, Daniel,MD 7:37 PM

## 2015-11-14 NOTE — Progress Notes (Addendum)
Advanced Heart Failure Rounding Note   Subjective:    70 y.o. man with history of ischemic cardiomyopathy, s/p CABG in 2003 and prior stenting, LVEF 15-20% by echo in 10/2015, S/p [employer] single chamber ICD in 05/2012, on coumadin for prior PE/CVA per records, was recently admitted to [facility] 5/31-6/9 for low output HF and marked volume overload (diuresed 27 pounds on milrinone). Readmitted 6/10 with large acute/subacute SDH.   CT Brain this am:   Acute to subacute LEFT holo hemispheric 11 mm subdural hematoma resulting in 10 mm LEFT-to-RIGHT midline shift. 4 mm LEFT falcotentorial subdural hematoma.  Still with HA but improved. No SOB or focal neuro deficit.  SBP 80s   Objective:   Weight Range:  Vital Signs:   Temp:  [97.6 F (36.4 C)-98.8 F (37.1 C)] 98.3 F (36.8 C) (06/11 0800) Pulse Rate:  [59-83] 65 (06/11 1000) Resp:  [13-19] 15 (06/11 1000) BP: (76-96)/(52-69) 86/54 mmHg (06/11 1000) SpO2:  [97 %-100 %] 97 % (06/11 1000) Weight:  [68.4 kg (150 lb 12.7 oz)] 68.4 kg (150 lb 12.7 oz) (06/11 0439) Last BM Date:  (PTA)  Weight change: Filed Weights   11/14/15 0439  Weight: 68.4 kg (150 lb 12.7 oz)    Intake/Output:   Intake/Output Summary (Last 24 hours) at 11/14/15 1121 Last data filed at 11/14/15 0800  Gross per 24 hour  Intake    360 ml  Output    825 ml  Net   -465 ml     Physical Exam: General: Elderly appearing. NAD HEENT: normal Neck: supple. JVP 6  Carotids 2+ bilat; no bruits. No thyromegaly or nodule noted.  Cor: PMI nondisplaced. RRR. No rubs. 2/6 MR. + S3. Lungs: clear Abdomen: soft, NT, ND, no HSM. No bruits or masses. +BS  Extremities: no cyanosis, clubbing, rash. No edema. SCDs  Neuro: alert & oriented x 3, cranial nerves grossly intact. moves all 4 extremities w/o difficulty. Affect pleasant.  Telemetry: NSR  Labs: Basic Metabolic Panel:  Recent Labs Lab 11/09/15 0850 11/10/15 0456 11/11/15 0430 11/12/15 0500  11/13/15 2240  NA 135 135 134* 133* 133*  K 3.8 4.7 3.7 4.2 3.9  CL 103 102 98* 100* 100*  CO2 23 25 27 24 23   GLUCOSE 147* 133* 103* 161* 128*  BUN 20 22* 22* 20 33*  CREATININE 0.98 1.05 0.98 1.03 1.17  CALCIUM 9.2 9.5 9.5 9.3 9.3    Liver Function Tests:  Recent Labs Lab 11/13/15 2240  AST 22  ALT 13*  ALKPHOS 107  BILITOT 2.2*  PROT 7.4  ALBUMIN 3.0*   No results for input(s): LIPASE, AMYLASE in the last 168 hours. No results for input(s): AMMONIA in the last 168 hours.  CBC:  Recent Labs Lab 11/08/15 0445 11/09/15 0513 11/11/15 0430 11/12/15 0500 11/13/15 2240  WBC 7.7 8.1 6.5 5.4 5.2  NEUTROABS 4.1 5.0  --   --  3.0  HGB 10.7* 11.0* 11.1* 10.6* 10.9*  HCT 35.3* 36.2* 36.2* 34.8* 35.9*  MCV 86.5 88.5 88.9 88.3 89.8  PLT 184 174 185 186 175    Cardiac Enzymes: No results for input(s): CKTOTAL, CKMB, CKMBINDEX, TROPONINI in the last 168 hours.  BNP: BNP (last 3 results)  Recent Labs  10/07/15 1227 11/03/15 1455  BNP 2346.0* 2344.5*    ProBNP (last 3 results) No results for input(s): PROBNP in the last 8760 hours.    Other results:  Imaging: Ct Head Wo Contrast  11/14/2015  CLINICAL DATA:  [facility] 2 weeks ago, followup 1 cm LEFT subdural hematoma seen at outside institution. History of hypertension. EXAM: CT HEAD WITHOUT CONTRAST TECHNIQUE: Contiguous axial images were obtained from the base of the skull through the vertex without intravenous contrast. COMPARISON:  None. FINDINGS: INTRACRANIAL CONTENTS: 11 mm intermediate density with hematocrit level LEFT holohemispheric subdural hematoma. Dense 4 mm LEFT subdural hematoma extending to the LEFT falx. 10 mm LEFT-to-RIGHT midline shift with LEFT temporal horn effacement. No ventricular entrapment or hydrocephalus. RIGHT occipital lobe encephalomalacia with porencephalic. No intraparenchymal hemorrhage or acute large vascular territory infarcts. Basal cisterns are patent. Mild calcific atherosclerosis of  the carotid siphons. ORBITS: The included ocular globes and orbital contents are non-suspicious. SINUSES: The mastoid aircells and included paranasal sinuses are well-aerated. SKULL/SOFT TISSUES: No skull fracture. No significant soft tissue swelling. IMPRESSION: Acute to subacute LEFT holo hemispheric 11 mm subdural hematoma resulting in 10 mm LEFT-to-RIGHT midline shift. 4 mm LEFT falcotentorial subdural hematoma. RIGHT occipital lobe encephalomalacia compatible with old RIGHT posterior cerebral artery territory infarct. Electronically Signed   By: Elon Alas M.D.   On: 11/14/2015 06:50      Medications:     Scheduled Medications: . ferrous sulfate  325 mg Oral Q breakfast  . isosorbide-hydrALAZINE  1 tablet Oral TID  . senna  1 tablet Oral BID  . sodium chloride flush  3 mL Intravenous Q12H  . spironolactone  12.5 mg Oral Daily  . torsemide  40 mg Oral BID     Infusions:     PRN Medications:  sodium chloride, acetaminophen **OR** acetaminophen, aspirin EC, metolazone, ondansetron **OR** ondansetron (ZOFRAN) IV, oxyCODONE, polyethylene glycol, potassium chloride SA, sodium chloride flush, traMADol   Assessment:   1. SDH 2. 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 CAD s/p CABG 4. ETOH abuse 5. Hx of CVA - was on coumadin 6. Anemia, iron deficient    Plan/Discussion:    HF stable. Weight essentially the same as d/c. BP low. Will cut Bidil and torsemide in half.  No b-blocker yet due to recent low output.   CT scan of brain reviewed. Large acute/subacute left SDH with midline shift. NSU managing. Off coumadin. INR 1.5. Will leave decision for FFP to NSU.  Given severe HF he is at significant, but not prohibitive, risk for surgery if felt to be clinically indicated.    Length of Stay: 1  Alec Jaros MD 11/14/2015, 11:21 AM  Advanced Heart Failure Team Pager  [phone] (M-F; [place])  Please contact [facility] for night-coverage after hours (4p -7a ) and weekends on [email or web address]

## 2015-11-15 ENCOUNTER — Encounter (HOSPITAL_COMMUNITY): Payer: Self-pay | Admitting: Anesthesiology

## 2015-11-15 ENCOUNTER — Encounter (HOSPITAL_COMMUNITY)
Admission: AD | Disposition: A | Discharge status: Home / Self Care | Payer: Self-pay | Source: Other Acute Inpatient Hospital | Attending: Neurosurgery

## 2015-11-15 ENCOUNTER — Inpatient Hospital Stay (HOSPITAL_COMMUNITY): Payer: Medicare Other | Admitting: Anesthesiology

## 2015-11-15 HISTORY — PX: BURR HOLE: SHX908

## 2015-11-15 LAB — BASIC METABOLIC PANEL
Anion gap: 10 (ref 5–15)
BUN: 26 mg/dL — AB (ref 6–20)
CO2: 25 mmol/L (ref 22–32)
CREATININE: 1.05 mg/dL (ref 0.61–1.24)
Calcium: 9.5 mg/dL (ref 8.9–10.3)
Chloride: 100 mmol/L — ABNORMAL LOW (ref 101–111)
GFR calc Af Amer: >60 mL/min (ref >60)
Glucose, Bld: 128 mg/dL — ABNORMAL HIGH (ref 65–99)
POTASSIUM: 4.2 mmol/L (ref 3.5–5.1)
SODIUM: 135 mmol/L (ref 135–145)

## 2015-11-15 LAB — PROTIME-INR
INR: 1.33 (ref 0.00–1.49)
Prothrombin Time: 16.6 seconds — ABNORMAL HIGH (ref 11.6–15.2)

## 2015-11-15 LAB — SURGICAL PCR SCREEN
MRSA, PCR: NEGATIVE
STAPHYLOCOCCUS AUREUS: POSITIVE — AB

## 2015-11-15 LAB — CBC
HEMATOCRIT: 34.5 % — AB (ref 39.0–52.0)
Hemoglobin: 10.7 g/dL — ABNORMAL LOW (ref 13.0–17.0)
MCH: 27.6 pg (ref 26.0–34.0)
MCHC: 31 g/dL (ref 30.0–36.0)
MCV: 88.9 fL (ref 78.0–100.0)
PLATELETS: 217 10*3/uL (ref 150–400)
RBC: 3.88 MIL/uL — ABNORMAL LOW (ref 4.22–5.81)
RDW: 19.8 % — AB (ref 11.5–15.5)
WBC: 5.7 10*3/uL (ref 4.0–10.5)

## 2015-11-15 LAB — URINE CULTURE

## 2015-11-15 SURGERY — CREATION, CRANIAL BURR HOLE
Anesthesia: Monitor Anesthesia Care | Site: Head | Laterality: Left

## 2015-11-15 MED ORDER — SUCCINYLCHOLINE CHLORIDE 200 MG/10ML IV SOSY
PREFILLED_SYRINGE | INTRAVENOUS | Status: AC
Start: 2015-11-15 — End: 2015-11-15
  Filled 2015-11-15: qty 10

## 2015-11-15 MED ORDER — CEFAZOLIN SODIUM-DEXTROSE 2-4 GM/100ML-% IV SOLN
2.0000 g | INTRAVENOUS | Status: DC
  Filled 2015-11-15: qty 100

## 2015-11-15 MED ORDER — LIDOCAINE-EPINEPHRINE 0.5 %-1:200000 IJ SOLN
INTRAMUSCULAR | Status: DC | PRN
  Administered 2015-11-15: 13 mL via INTRADERMAL

## 2015-11-15 MED ORDER — LIDOCAINE 2% (20 MG/ML) 5 ML SYRINGE
INTRAMUSCULAR | Status: AC
  Filled 2015-11-15: qty 10

## 2015-11-15 MED ORDER — DEXMEDETOMIDINE HCL 200 MCG/2ML IV SOLN
INTRAVENOUS | Status: DC | PRN
  Administered 2015-11-15 (×2): 8 ug via INTRAVENOUS

## 2015-11-15 MED ORDER — HEMOSTATIC AGENTS (NO CHARGE) OPTIME
TOPICAL | Status: DC | PRN
  Administered 2015-11-15: 1 via TOPICAL

## 2015-11-15 MED ORDER — CEFAZOLIN SODIUM-DEXTROSE 2-3 GM-% IV SOLR
INTRAVENOUS | Status: DC | PRN
  Administered 2015-11-15: 2 g via INTRAVENOUS

## 2015-11-15 MED ORDER — PROPOFOL 10 MG/ML IV BOLUS
INTRAVENOUS | Status: AC
Start: 2015-11-15 — End: 2015-11-15
  Filled 2015-11-15: qty 20

## 2015-11-15 MED ORDER — SODIUM CHLORIDE 0.9 % IV SOLN
INTRAVENOUS | Status: DC | PRN
Start: 2015-11-15 — End: 2015-11-15
  Administered 2015-11-15: 18:00:00 via INTRAVENOUS

## 2015-11-15 MED ORDER — DEXMEDETOMIDINE HCL IN NACL 200 MCG/50ML IV SOLN
INTRAVENOUS | Status: AC
  Filled 2015-11-15: qty 50

## 2015-11-15 MED ORDER — THROMBIN 5000 UNITS EX SOLR
CUTANEOUS | Status: DC | PRN
  Administered 2015-11-15 (×2): 5000 [IU] via TOPICAL

## 2015-11-15 MED ORDER — EPHEDRINE 5 MG/ML INJ
INTRAVENOUS | Status: AC
  Filled 2015-11-15: qty 10

## 2015-11-15 MED ORDER — ROCURONIUM BROMIDE 50 MG/5ML IV SOLN
INTRAVENOUS | Status: AC
Start: 2015-11-15 — End: 2015-11-15
  Filled 2015-11-15: qty 1

## 2015-11-15 MED ORDER — DEXMEDETOMIDINE HCL IN NACL 200 MCG/50ML IV SOLN
INTRAVENOUS | Status: DC | PRN
Start: 2015-11-15 — End: 2015-11-15
  Administered 2015-11-15: .6 ug/kg/h via INTRAVENOUS

## 2015-11-15 MED ORDER — ETOMIDATE 2 MG/ML IV SOLN
INTRAVENOUS | Status: AC
Start: 2015-11-15 — End: 2015-11-15
  Filled 2015-11-15: qty 10

## 2015-11-15 MED ORDER — SODIUM CHLORIDE 0.9 % IV SOLN
0.0125 ug/kg/min | INTRAVENOUS | Status: AC
  Administered 2015-11-15: .05 ug/kg/min via INTRAVENOUS
  Filled 2015-11-15 (×2): qty 2000

## 2015-11-15 MED ORDER — 0.9 % SODIUM CHLORIDE (POUR BTL) OPTIME
TOPICAL | Status: DC | PRN
  Administered 2015-11-15 (×2): 1000 mL

## 2015-11-15 MED ORDER — ROCURONIUM BROMIDE 50 MG/5ML IV SOLN
INTRAVENOUS | Status: AC
  Filled 2015-11-15: qty 1

## 2015-11-15 SURGICAL SUPPLY — 71 items
BENZOIN TINCTURE PRP APPL 2/3 (GAUZE/BANDAGES/DRESSINGS) IMPLANT
BLADE CLIPPER SURG (BLADE) ×3 IMPLANT
BLADE ULTRA TIP 2M (BLADE) IMPLANT
BNDG GAUZE ELAST 4 BULKY (GAUZE/BANDAGES/DRESSINGS) IMPLANT
BRUSH SCRUB EZ 1% IODOPHOR (MISCELLANEOUS) IMPLANT
BUR ACORN 6.0 PRECISION (BURR) ×2 IMPLANT
BUR ACORN 6.0MM PRECISION (BURR) ×1
BUR ADDG 1.1 (BURR) IMPLANT
BUR ADDG 1.1MM (BURR)
BUR MATCHSTICK NEURO 3.0 LAGG (BURR) IMPLANT
BUR SPIRAL ROUTER 2.3 (BUR) IMPLANT
BUR SPIRAL ROUTER 2.3MM (BUR)
CANISTER SUCT 3000ML PPV (MISCELLANEOUS) ×3 IMPLANT
CATH ROBINSON RED A/P 14FR (CATHETERS) ×3 IMPLANT
CLIP TI MEDIUM 6 (CLIP) IMPLANT
COVER MAYO STAND STRL (DRAPES) ×6 IMPLANT
DRAIN SNY WOU 7FLT (WOUND CARE) IMPLANT
DRAPE NEUROLOGICAL W/INCISE (DRAPES) ×3 IMPLANT
DRAPE SURG 17X23 STRL (DRAPES) IMPLANT
DRAPE WARM FLUID 44X44 (DRAPE) ×3 IMPLANT
DRSG TELFA 3X8 NADH (GAUZE/BANDAGES/DRESSINGS) ×3 IMPLANT
DURAPREP 6ML APPLICATOR 50/CS (WOUND CARE) ×3 IMPLANT
ELECT CAUTERY BLADE 6.4 (BLADE) ×3 IMPLANT
ELECT REM PT RETURN 9FT ADLT (ELECTROSURGICAL) ×3
ELECTRODE REM PT RTRN 9FT ADLT (ELECTROSURGICAL) ×1 IMPLANT
EVACUATOR 1/8 PVC DRAIN (DRAIN) IMPLANT
EVACUATOR SILICONE 100CC (DRAIN) IMPLANT
GAUZE SPONGE 4X4 12PLY STRL (GAUZE/BANDAGES/DRESSINGS) ×3 IMPLANT
GAUZE SPONGE 4X4 16PLY XRAY LF (GAUZE/BANDAGES/DRESSINGS) IMPLANT
GLOVE BIO SURGEON STRL SZ 6.5 (GLOVE) ×2 IMPLANT
GLOVE BIO SURGEON STRL SZ7 (GLOVE) ×3 IMPLANT
GLOVE BIO SURGEONS STRL SZ 6.5 (GLOVE) ×1
GLOVE ECLIPSE 6.5 STRL STRAW (GLOVE) ×3 IMPLANT
GLOVE EXAM NITRILE LRG STRL (GLOVE) IMPLANT
GLOVE EXAM NITRILE MD LF STRL (GLOVE) IMPLANT
GLOVE EXAM NITRILE XL STR (GLOVE) IMPLANT
GLOVE EXAM NITRILE XS STR PU (GLOVE) IMPLANT
GOWN STRL REUS W/ TWL LRG LVL3 (GOWN DISPOSABLE) ×4 IMPLANT
GOWN STRL REUS W/ TWL XL LVL3 (GOWN DISPOSABLE) IMPLANT
GOWN STRL REUS W/TWL 2XL LVL3 (GOWN DISPOSABLE) IMPLANT
GOWN STRL REUS W/TWL LRG LVL3 (GOWN DISPOSABLE) ×8
GOWN STRL REUS W/TWL XL LVL3 (GOWN DISPOSABLE)
HEMOSTAT SURGICEL 2X14 (HEMOSTASIS) IMPLANT
KIT BASIN OR (CUSTOM PROCEDURE TRAY) ×3 IMPLANT
KIT ROOM TURNOVER OR (KITS) ×3 IMPLANT
NEEDLE HYPO 25X1 1.5 SAFETY (NEEDLE) ×3 IMPLANT
NS IRRIG 1000ML POUR BTL (IV SOLUTION) ×6 IMPLANT
PACK CRANIOTOMY (CUSTOM PROCEDURE TRAY) ×3 IMPLANT
PATTIES SURGICAL .5 X.5 (GAUZE/BANDAGES/DRESSINGS) IMPLANT
PATTIES SURGICAL .5 X3 (DISPOSABLE) IMPLANT
PATTIES SURGICAL 1X1 (DISPOSABLE) IMPLANT
SPONGE NEURO XRAY DETECT 1X3 (DISPOSABLE) IMPLANT
SPONGE SURGIFOAM ABS GEL 100 (HEMOSTASIS) IMPLANT
SPONGE SURGIFOAM ABS GEL SZ50 (HEMOSTASIS) ×3 IMPLANT
STAPLER VISISTAT 35W (STAPLE) ×3 IMPLANT
SUT ETHILON 3 0 FSL (SUTURE) IMPLANT
SUT ETHILON 3 0 PS 1 (SUTURE) IMPLANT
SUT NURALON 4 0 TR CR/8 (SUTURE) ×3 IMPLANT
SUT PL GUT 3 0 FS 1 (SUTURE) IMPLANT
SUT STEEL 0 (SUTURE)
SUT STEEL 0 18XMFL TIE 17 (SUTURE) IMPLANT
SUT VIC AB 2-0 CT2 18 VCP726D (SUTURE) ×3 IMPLANT
SYR CONTROL 10ML LL (SYRINGE) ×3 IMPLANT
TAPE CLOTH SURG 4X10 WHT LF (GAUZE/BANDAGES/DRESSINGS) ×3 IMPLANT
TOWEL OR 17X24 6PK STRL BLUE (TOWEL DISPOSABLE) ×3 IMPLANT
TOWEL OR 17X26 10 PK STRL BLUE (TOWEL DISPOSABLE) ×3 IMPLANT
TRAY FOLEY W/METER SILVER 16FR (SET/KITS/TRAYS/PACK) ×3 IMPLANT
TUBE CONNECTING 12'X1/4 (SUCTIONS) ×1
TUBE CONNECTING 12X1/4 (SUCTIONS) ×2 IMPLANT
UNDERPAD 30X30 INCONTINENT (UNDERPADS AND DIAPERS) IMPLANT
WATER STERILE IRR 1000ML POUR (IV SOLUTION) ×3 IMPLANT

## 2015-11-15 NOTE — Anesthesia Postprocedure Evaluation (Signed)
Anesthesia Post Note  Patient: Rick [name]  Procedure(s) Performed: Procedure(s) (LRB): Left side burr holes (Left)  Patient location during evaluation: PACU Anesthesia Type: MAC Level of consciousness: awake and alert Pain management: pain level controlled Vital Signs Assessment: post-procedure vital signs reviewed and stable Respiratory status: spontaneous breathing, nonlabored ventilation, respiratory function stable and patient connected to nasal cannula oxygen Cardiovascular status: stable and blood pressure returned to baseline Anesthetic complications: no Comments: Will continue milrinone and continue with heart failure team management.. Overall did exceptional from anesthesia standpoint    Last Vitals:  Filed Vitals:   11/15/15 1937 11/15/15 2006  BP: 101/71 94/67  Pulse: 64 72  Temp: 36.3 C   Resp: 12 21    Last Pain:  Filed Vitals:   11/15/15 2009  PainSc: Asleep                 Zenaida Deed

## 2015-11-15 NOTE — Care Management Important Message (Signed)
Important Message  Patient Details  Name: Rick [name] MRN: [id number] Date of Birth: 24-May-1946   Medicare Important Message Given:  Yes    Loann Quill 11/15/2015, 1:43 PM

## 2015-11-15 NOTE — Progress Notes (Signed)
Advanced Heart Failure Rounding Note   Subjective:    70 y.o. man with history of ischemic cardiomyopathy, s/p CABG in 2003 and prior stenting, LVEF 15-20% by echo in 10/2015, S/p [employer] single chamber ICD in 05/2012, on coumadin for prior PE/CVA per records, was recently admitted to [facility] 5/31-6/9 for low output HF and marked volume overload (diuresed 27 pounds on milrinone). Readmitted 6/10 with large acute/subacute SDH.   CT Brain 11/14/15:   Acute to subacute LEFT holo hemispheric 11 mm subdural hematoma resulting in 10 mm LEFT-to-RIGHT midline shift. 4 mm LEFT falcotentorial subdural hematoma.  Neuro following. Had planned for [facility] hole craniotomy last night, but another urgent case came up per patient.  Tentatively re-scheduled for 1600 today.   Headache feels "90% better" this am.  He states he had a large BM this morning, and hadn't in nearly a week. SBP 80-90s  Objective:   Weight Range:  Vital Signs:   Temp:  [98.2 F (36.8 C)-98.6 F (37 C)] 98.2 F (36.8 C) (06/12 0400) Pulse Rate:  [25-94] 79 (06/12 0700) Resp:  [9-23] 19 (06/12 0700) BP: (78-112)/(44-78) 92/74 mmHg (06/12 0700) SpO2:  [95 %-100 %] 98 % (06/12 0700) Weight:  [155 lb 10.3 oz (70.6 kg)] 155 lb 10.3 oz (70.6 kg) (06/12 0400) Last BM Date:  (PTA)  Weight change: Filed Weights   11/14/15 0439 11/15/15 0400  Weight: 150 lb 12.7 oz (68.4 kg) 155 lb 10.3 oz (70.6 kg)    Intake/Output:   Intake/Output Summary (Last 24 hours) at 11/15/15 0801 Last data filed at 11/15/15 0700  Gross per 24 hour  Intake 799.45 ml  Output   1925 ml  Net -1125.55 ml     Physical Exam: General: Elderly appearing. NAD HEENT: normal Neck: supple. JVP 7-8  Carotids 2+ bilat; no bruits. No thyromegaly or nodule noted.  Cor: PMI nondisplaced. RRR. No rubs. 2/6 MR. + S3. Lungs: CTAB, normal effort Abdomen: soft, NT, ND, no HSM. No bruits or masses. +BS  Extremities: no cyanosis, clubbing, rash. No  edema. SCDs  Neuro: alert & oriented x 3, cranial nerves grossly intact. moves all 4 extremities w/o difficulty. Affect pleasant.  Telemetry: Reviewed, NSR  Labs: Basic Metabolic Panel:  Recent Labs Lab 11/09/15 0850 11/10/15 0456 11/11/15 0430 11/12/15 0500 11/13/15 2240  NA 135 135 134* 133* 133*  K 3.8 4.7 3.7 4.2 3.9  CL 103 102 98* 100* 100*  CO2 23 25 27 24 23   GLUCOSE 147* 133* 103* 161* 128*  BUN 20 22* 22* 20 33*  CREATININE 0.98 1.05 0.98 1.03 1.17  CALCIUM 9.2 9.5 9.5 9.3 9.3    Liver Function Tests:  Recent Labs Lab 11/13/15 2240  AST 22  ALT 13*  ALKPHOS 107  BILITOT 2.2*  PROT 7.4  ALBUMIN 3.0*   No results for input(s): LIPASE, AMYLASE in the last 168 hours. No results for input(s): AMMONIA in the last 168 hours.  CBC:  Recent Labs Lab 11/09/15 0513 11/11/15 0430 11/12/15 0500 11/13/15 2240  WBC 8.1 6.5 5.4 5.2  NEUTROABS 5.0  --   --  3.0  HGB 11.0* 11.1* 10.6* 10.9*  HCT 36.2* 36.2* 34.8* 35.9*  MCV 88.5 88.9 88.3 89.8  PLT 174 185 186 175    Cardiac Enzymes: No results for input(s): CKTOTAL, CKMB, CKMBINDEX, TROPONINI in the last 168 hours.  BNP: BNP (last 3 results)  Recent Labs  10/07/15 1227 11/03/15 1455  BNP 2346.0* 2344.5*  ProBNP (last 3 results) No results for input(s): PROBNP in the last 8760 hours.  Other results:  Imaging: Ct Head Wo Contrast  11/14/2015  CLINICAL DATA:  [place] 2 weeks ago, followup 1 cm LEFT subdural hematoma seen at outside institution. History of hypertension. EXAM: CT HEAD WITHOUT CONTRAST TECHNIQUE: Contiguous axial images were obtained from the base of the skull through the vertex without intravenous contrast. COMPARISON:  None. FINDINGS: INTRACRANIAL CONTENTS: 11 mm intermediate density with hematocrit level LEFT holohemispheric subdural hematoma. Dense 4 mm LEFT subdural hematoma extending to the LEFT falx. 10 mm LEFT-to-RIGHT midline shift with LEFT temporal horn effacement. No  ventricular entrapment or hydrocephalus. RIGHT occipital lobe encephalomalacia with porencephalic. No intraparenchymal hemorrhage or acute large vascular territory infarcts. Basal cisterns are patent. Mild calcific atherosclerosis of the carotid siphons. ORBITS: The included ocular globes and orbital contents are non-suspicious. SINUSES: The mastoid aircells and included paranasal sinuses are well-aerated. SKULL/SOFT TISSUES: No skull fracture. No significant soft tissue swelling. IMPRESSION: Acute to subacute LEFT holo hemispheric 11 mm subdural hematoma resulting in 10 mm LEFT-to-RIGHT midline shift. 4 mm LEFT falcotentorial subdural hematoma. RIGHT occipital lobe encephalomalacia compatible with old RIGHT posterior cerebral artery territory infarct. Electronically Signed   By: Elon Alas M.D.   On: 11/14/2015 06:50     Medications:     Scheduled Medications: .  ceFAZolin (ANCEF) IV  2 g Intravenous To SS-Surg  . ferrous sulfate  325 mg Oral Q breakfast  . isosorbide-hydrALAZINE  0.5 tablet Oral TID  . senna  1 tablet Oral BID  . sodium chloride flush  3 mL Intravenous Q12H  . spironolactone  12.5 mg Oral Daily  . torsemide  20 mg Oral BID    Infusions: . milrinone 0.125 mcg/kg/min (11/14/15 2106)    PRN Medications: sodium chloride, acetaminophen **OR** acetaminophen, aspirin EC, ondansetron **OR** ondansetron (ZOFRAN) IV, oxyCODONE, polyethylene glycol, potassium chloride SA, sodium chloride flush, traMADol   Assessment:   1. SDH 2. 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 CAD s/p CABG 4. ETOH abuse 5. Hx of CVA - was on coumadin 6. Anemia, iron deficient    Plan/Discussion:    Weight shows up 5 lbs despite negative diuresis. May need additional diuresis in next day or so. BP remains soft. Bidil and torsemide cut in half 11/14/15.    No b-blocker yet due to recent low  output.  No labs x 2 days.  BMET pending for this am.   On milrinone 0.125 mcg/kg/min to stabilize for surgery. Able to weaned off prior to recent discharge. Will need PICC line placed if going home with this time.   CT scan of brain reviewed. Large acute/subacute left SDH with midline shift. NSU managing. Off coumadin. INR 1.27, reversed yesterday per Neuro.   Given severe HF he is at significant, but not prohibitive, risk for surgery if felt to be clinically indicated.   Pending burr hole craniotomy per Neuro, possibly this pm.   Length of Stay: 2  Shirley Friar, PA-C 11/15/2015, 8:01 AM  Advanced Heart Failure Team Pager [phone] (M-F; 7a - 4p)  Please contact [facility] for night-coverage after hours (4p -7a ) and weekends on [email or web address]  Patient seen and examined with Oda Kilts, PA-C. We discussed all aspects of the encounter. I agree with the assessment and plan as stated above.   Now on milrinone for peri-operative  cardiac support. Volume status looks of. BP soft but stable. Patient and his family discussed burr hole craniotomy with NSU yesterday and want to proceed. They realize he is at significant risk for cardiac decompensation but we will do all we can to minimize the risk. We will continue to follow closely after surgery.   Adelynne Joerger,MD 6:27 PM

## 2015-11-15 NOTE — Op Note (Signed)
11/13/2015 - 11/15/2015  7:46 PM  PATIENT:  Rick [name]  70 y.o. 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 I have recommended operative evacuation  PRE-OPERATIVE DIAGNOSIS:  Subdural Hematoma, left chronic  POST-OPERATIVE DIAGNOSIS:  Subdural Hematoma, left chronic  PROCEDURE:  Procedure(s): Left side burr holes for hematoma evacuation  SURGEON: Surgeon(s): Ashok Pall, MD Kevan Ny Ditty, MD  ASSISTANTS:Ditty, [name]  ANESTHESIA:   local and IV sedation  EBL:  Total I/O In: -  Out: 50 [Blood:50]  BLOOD ADMINISTERED:none  CELL SAVER GIVEN:none  COUNT:per nursing  DRAINS: (1) Jackson-Pratt drain(s) with closed bulb suction in the subdural space   SPECIMEN:  No Specimen  DICTATION: [name] was taken to the operating room  and given IV sedation. I prepped his head after he was positioned with his head on a donut pad. I drew out the incisions on the left side. I infiltrated lidocaine into the planned incisions. I then opened the scalp with a  10 blade at both sites. We placed wietlander retractors to expose the skull. Burr holes were created and the dura opened at both sites. We encountered liquified subdural blood which came out of each burr hole under pressure. We then irrigated the subdural space with a red rubber catheter in all directions. I then placed a JP drain in the frontal burr hole, brought it through the skin via a stab incision. The temporal burr hole was closed in layers with vicryl sutures, and the scalp approximated with staples. The frontal burr hole was closed in similar fashion. I applied sterile dressings, and connected the drain to bulb suction.    PLAN OF CARE: Admit to inpatient   PATIENT DISPOSITION:  PACU - hemodynamically stable.   Delay start of Pharmacological VTE agent (>24hrs) due to surgical blood loss or risk of bleeding:  yes

## 2015-11-15 NOTE — Transfer of Care (Signed)
Immediate Anesthesia Transfer of Care Note  Patient: Rick [name]  Procedure(s) Performed: Procedure(s): Left side burr holes (Left)  Patient Location: PACU  Anesthesia Type:MAC  Level of Consciousness: awake  Airway & Oxygen Therapy: Patient Spontanous Breathing  Post-op Assessment: Report given to RN and Post -op Vital signs reviewed and stable  Post vital signs: Reviewed and stable  Last Vitals:  Filed Vitals:   11/15/15 1700 11/15/15 1937  BP:    Pulse:    Temp: 36.6 C 36.3 C  Resp:      Last Pain:  Filed Vitals:   11/15/15 1937  PainSc: Asleep         Complications: No apparent anesthesia complications

## 2015-11-15 NOTE — Anesthesia Preprocedure Evaluation (Addendum)
Anesthesia Evaluation  Patient identified by MRN, date of birth, ID band Patient awake    Reviewed: Allergy & Precautions, H&P , Patient's Chart, lab work & pertinent test results, reviewed documented beta blocker date and time   Airway Mallampati: II  TM Distance: >3 FB Neck ROM: full    Dental no notable dental hx. (+) Dental Advisory Given, Teeth Intact   Pulmonary former smoker,    Pulmonary exam normal breath sounds clear to auscultation       Cardiovascular hypertension,  Rhythm:regular Rate:Normal     Neuro/Psych    GI/Hepatic   Endo/Other    Renal/GU      Musculoskeletal   Abdominal   Peds  Hematology   Anesthesia Other Findings Hx of Hypertension; now chronic low BP from cardiomyopathy Ischemic cardiomyopathy...Marland KitchenMarland KitchenEF 20%   Hx of pulmonary embolism and infarction reversed coumadinHyperlipidemia   DOE (dyspnea on exertion)  CAD (coronary artery disease)       Tricuspid regurgitation....Marland KitchenMarland KitchenSevere Pulmonary hypertension 56 mm Hg  AICD (automatic cardioverter/defibrillator) present   CHF (congestive heart failure) ([place])   Myocardial infarction acute 2003   Sleep apnea  Non-alcoholic cirrhosis     Stroke  left side facial droop     Reproductive/Obstetrics                            Anesthesia Physical Anesthesia Plan  ASA: III  Anesthesia Plan: MAC   Post-op Pain Management:    Induction: Intravenous  Airway Management Planned: Mask and Natural Airway  Additional Equipment: Arterial line  Intra-op Plan:   Post-operative Plan:   Informed Consent: I have reviewed the patients History and Physical, chart, labs and discussed the procedure including the risks, benefits and alternatives for the proposed anesthesia with the patient or authorized representative who has indicated his/her understanding and acceptance.   Dental Advisory Given  Plan Discussed with:  CRNA, Surgeon and Anesthesiologist  Anesthesia Plan Comments: (Plan precedex and Ultiva sedation with potential to need to place airway or ETT if warranted by clinical changes intra-operatively. We will start procedure as MAC.)       Anesthesia Quick Evaluation

## 2015-11-15 NOTE — Progress Notes (Signed)
Patient ID: Rick [name], male   DOB: [birth year], 70 y.o.   MRN: [id number] BP 91/70 mmHg  Pulse 84  Temp(Src) 97.9 F (36.6 C) (Oral)  Resp 11  Ht 5\' 11"  (1.803 m)  Wt 70.6 kg (155 lb 10.3 oz)  BMI 21.72 kg/m2  SpO2 100% Alert and oriented x 4. Will proceed with burr holes for subdural hematoma drainage. Cancelled yesterday due to emergency surgery Will perform with iv sedAtion, under local anesthesia Mr. Schoening would like to proceed.

## 2015-11-16 ENCOUNTER — Encounter (HOSPITAL_COMMUNITY): Payer: Self-pay | Admitting: Neurosurgery

## 2015-11-16 LAB — BASIC METABOLIC PANEL
ANION GAP: 7 (ref 5–15)
Anion gap: 9 (ref 5–15)
BUN: 23 mg/dL — ABNORMAL HIGH (ref 6–20)
BUN: 19 mg/dL (ref 6–20)
CHLORIDE: 97 mmol/L — AB (ref 101–111)
CO2: 27 mmol/L (ref 22–32)
CO2: 26 mmol/L (ref 22–32)
CREATININE: 0.96 mg/dL (ref 0.61–1.24)
Calcium: 9.5 mg/dL (ref 8.9–10.3)
Calcium: 9.4 mg/dL (ref 8.9–10.3)
Chloride: 103 mmol/L (ref 101–111)
Creatinine, Ser: 1.03 mg/dL (ref 0.61–1.24)
GFR calc Af Amer: >60 mL/min (ref >60)
GFR calc Af Amer: >60 mL/min (ref >60)
GLUCOSE: 153 mg/dL — AB (ref 65–99)
Glucose, Bld: 160 mg/dL — ABNORMAL HIGH (ref 65–99)
POTASSIUM: 3.8 mmol/L (ref 3.5–5.1)
Potassium: 3.7 mmol/L (ref 3.5–5.1)
SODIUM: 132 mmol/L — AB (ref 135–145)
Sodium: 137 mmol/L (ref 135–145)

## 2015-11-16 LAB — CBC
HEMATOCRIT: 35.8 % — AB (ref 39.0–52.0)
HEMOGLOBIN: 11 g/dL — AB (ref 13.0–17.0)
MCH: 27.7 pg (ref 26.0–34.0)
MCHC: 30.7 g/dL (ref 30.0–36.0)
MCV: 90.2 fL (ref 78.0–100.0)
Platelets: 204 10*3/uL (ref 150–400)
RBC: 3.97 MIL/uL — ABNORMAL LOW (ref 4.22–5.81)
RDW: 19.7 % — ABNORMAL HIGH (ref 11.5–15.5)
WBC: 6.4 10*3/uL (ref 4.0–10.5)

## 2015-11-16 LAB — PROTIME-INR
INR: 1.31 (ref 0.00–1.49)
Prothrombin Time: 16.5 seconds — ABNORMAL HIGH (ref 11.6–15.2)

## 2015-11-16 LAB — MAGNESIUM: MAGNESIUM: 1.6 mg/dL — AB (ref 1.7–2.4)

## 2015-11-16 NOTE — Progress Notes (Addendum)
Advanced Heart Failure Rounding Note   Subjective:    Readmitted 6/10 with large acute/subacute SDH.  S/P burr hole craniotomy 6/12  Stable post-op. Remains on milrinone. Complaining of mild headache. Denies SOB.     Objective:   Weight Range:  Vital Signs:   Temp:  [97.3 F (36.3 C)-98.5 F (36.9 C)] 98.5 F (36.9 C) (06/13 1200) Pulse Rate:  [58-88] 71 (06/13 1200) Resp:  [8-24] 14 (06/13 1300) BP: (80-110)/(46-75) 101/73 mmHg (06/13 1300) SpO2:  [96 %-100 %] 98 % (06/13 1300) Arterial Line BP: (112-133)/(48-64) 113/63 mmHg (06/13 0700) Weight:  [151 lb 7.3 oz (68.7 kg)-153 lb (69.4 kg)] 153 lb (69.4 kg) (06/13 0500) Last BM Date: 11/14/15  Weight change: Filed Weights   11/15/15 0400 11/15/15 2154 11/16/15 0500  Weight: 155 lb 10.3 oz (70.6 kg) 151 lb 7.3 oz (68.7 kg) 153 lb (69.4 kg)    Intake/Output:   Intake/Output Summary (Last 24 hours) at 11/16/15 1332 Last data filed at 11/16/15 1300  Gross per 24 hour  Intake 1677.4 ml  Output   1130 ml  Net  547.4 ml     Physical Exam: General: Elderly appearing. NAD HEENT: normal Left scalp. Dressing intact Neck: supple. JVP 7-8  Carotids 2+ bilat; no bruits. No thyromegaly or nodule noted.  Cor: PMI nondisplaced. RRR. No rubs. 2/6 MR. + S3. Lungs: CTAB, normal effort Abdomen: soft, NT, ND, no HSM. No bruits or masses. +BS  Extremities: no cyanosis, clubbing, rash. No edema. SCDs  Neuro: alert & oriented x 3, cranial nerves grossly intact. moves all 4 extremities w/o difficulty. Affect pleasant.  Telemetry: Reviewed, NSR  Labs: Basic Metabolic Panel:  Recent Labs Lab 11/11/15 0430 11/12/15 0500 11/13/15 2240 11/15/15 0932 11/16/15 0220  NA 134* 133* 133* 135 137  K 3.7 4.2 3.9 4.2 3.8  CL 98* 100* 100* 100* 103  CO2 27 24 23 25 27   GLUCOSE 103* 161* 128* 128* 153*  BUN 22* 20 33* 26* 23*  CREATININE 0.98 1.03 1.17 1.05 1.03  CALCIUM 9.5 9.3 9.3 9.5 9.5    Liver Function Tests:  Recent  Labs Lab 11/13/15 2240  AST 22  ALT 13*  ALKPHOS 107  BILITOT 2.2*  PROT 7.4  ALBUMIN 3.0*   No results for input(s): LIPASE, AMYLASE in the last 168 hours. No results for input(s): AMMONIA in the last 168 hours.  CBC:  Recent Labs Lab 11/11/15 0430 11/12/15 0500 11/13/15 2240 11/15/15 0932 11/16/15 0220  WBC 6.5 5.4 5.2 5.7 6.4  NEUTROABS  --   --  3.0  --   --   HGB 11.1* 10.6* 10.9* 10.7* 11.0*  HCT 36.2* 34.8* 35.9* 34.5* 35.8*  MCV 88.9 88.3 89.8 88.9 90.2  PLT 185 186 175 217 204    Cardiac Enzymes: No results for input(s): CKTOTAL, CKMB, CKMBINDEX, TROPONINI in the last 168 hours.  BNP: BNP (last 3 results)  Recent Labs  10/07/15 1227 11/03/15 1455  BNP 2346.0* 2344.5*    ProBNP (last 3 results) No results for input(s): PROBNP in the last 8760 hours.  Other results:  Imaging: No results found.   Medications:     Scheduled Medications: . ferrous sulfate  325 mg Oral Q breakfast  . isosorbide-hydrALAZINE  0.5 tablet Oral TID  . senna  1 tablet Oral BID  . sodium chloride flush  3 mL Intravenous Q12H  . spironolactone  12.5 mg Oral Daily  . torsemide  20 mg Oral BID  Infusions: . milrinone 0.125 mcg/kg/min (11/16/15 1300)    PRN Medications: sodium chloride, acetaminophen **OR** acetaminophen, ondansetron **OR** ondansetron (ZOFRAN) IV, oxyCODONE, polyethylene glycol, potassium chloride SA, sodium chloride flush, traMADol   Assessment:   1. SDH-S/P 6/12 [place] Burr holes for evacuation.  2. 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 CAD s/p CABG 4. ETOH abuse 5. Hx of CVA - was on coumadin 6. Anemia, iron deficient    Plan/Discussion:   Post OP Day 1- bur holes for evacuation of SDH. Hgb stable.   Remains on milrinone 0.125 mcg. Remains on bidil 1/2 tab tid, spiro 12.5 mg daily, and torsemide 20 mg twice a day. Renal function stable.     Length of Stay: 3  Amy Clegg, NP-C  11/16/2015, 1:32 PM  Advanced Heart Failure Team Pager [phone] (M-F; [place])  Please contact [facility] for night-coverage after hours (4p -7a ) and weekends on [email or web address]  Patient seen and examined with Darrick Grinder, NP. We discussed all aspects of the encounter. I agree with the assessment and plan as stated above.   He is stable s/p burr hole evacuation of large SHD. HF status stable. Continue milrinone for at least 24 more hours. We will continue to follow very closely.   Bensimhon, Daniel,MD 8:42 PM   Tele reviewed. He is having frequent PVC and NSVT. SBP 140. Will stop milrinone. Check BMET and Mag.  Bensimhon, Daniel,MD 9:23 PM

## 2015-11-16 NOTE — Progress Notes (Signed)
Patient ID: Rick [name], male   DOB: 1946-03-16, 70 y.o.   MRN: [id number] BP 97/71 mmHg  Pulse 83  Temp(Src) 98.7 F (37.1 C) (Oral)  Resp 19  Ht 5\' 11"  (1.803 m)  Wt 69.4 kg (153 lb)  BMI 21.35 kg/m2  SpO2 99% Alert and oriented x 4, speech is clear and fluent. Moving all extremities well Dressing blood stained, intact.  JP intact. Little drainage at this time.  Will remove the jp tomorrow.

## 2015-11-17 ENCOUNTER — Inpatient Hospital Stay (HOSPITAL_COMMUNITY): Payer: Medicare Other

## 2015-11-17 DIAGNOSIS — I472 Ventricular tachycardia: Secondary | ICD-10-CM

## 2015-11-17 MED ORDER — MAGNESIUM SULFATE 4 GM/100ML IV SOLN
4.0000 g | Freq: Once | INTRAVENOUS | Status: AC
  Administered 2015-11-17: 4 g via INTRAVENOUS
  Filled 2015-11-17: qty 100

## 2015-11-17 MED ORDER — POTASSIUM CHLORIDE CRYS ER 20 MEQ PO TBCR
40.0000 meq | EXTENDED_RELEASE_TABLET | Freq: Once | ORAL | Status: AC
  Administered 2015-11-17: 40 meq via ORAL
  Filled 2015-11-17: qty 2

## 2015-11-17 NOTE — Progress Notes (Signed)
Patient ID: Rick [name], male   DOB: 1945-12-17, 70 y.o.   MRN: [id number] BP 103/64 mmHg  Pulse 84  Temp(Src) 98.2 F (36.8 C) (Oral)  Resp 26  Ht 5\' 11"  (1.803 m)  Wt 69.2 kg (152 lb 8.9 oz)  BMI 21.29 kg/m2  SpO2 100%' Alert and oriented x 4 Will remove drain in am. Repeat head ct shows resolution midline shift. Doing very well

## 2015-11-17 NOTE — Progress Notes (Signed)
Advanced Heart Failure Rounding Note   Subjective:    Readmitted 6/10 with large acute/subacute SDH.  S/P burr hole craniotomy 6/12  Yesterday milrinone stopped to frequent PVCs.    Complaining of mild headache. Denies SOB.     Objective:   Weight Range:  Vital Signs:   Temp:  [98.1 F (36.7 C)-99.1 F (37.3 C)] 98.5 F (36.9 C) (06/14 0800) Pulse Rate:  [66-95] 87 (06/14 1000) Resp:  [10-23] 15 (06/14 1000) BP: (84-116)/(57-77) 92/66 mmHg (06/14 1000) SpO2:  [96 %-100 %] 100 % (06/14 1000) Arterial Line BP: (118-154)/(58-67) 118/64 mmHg (06/14 1000) Weight:  [152 lb 8.9 oz (69.2 kg)] 152 lb 8.9 oz (69.2 kg) (06/14 0500) Last BM Date: 11/16/15  Weight change: Filed Weights   11/15/15 2154 11/16/15 0500 11/17/15 0500  Weight: 151 lb 7.3 oz (68.7 kg) 153 lb (69.4 kg) 152 lb 8.9 oz (69.2 kg)    Intake/Output:   Intake/Output Summary (Last 24 hours) at 11/17/15 1101 Last data filed at 11/17/15 1000  Gross per 24 hour  Intake 1512.88 ml  Output   1895 ml  Net -382.12 ml     Physical Exam: General: Elderly appearing. NAD. In bed.  HEENT: normal Left scalp. Dressing intact with JP drain.  Neck: supple. JVP 7-8  Carotids 2+ bilat; no bruits. No thyromegaly or nodule noted.  Cor: PMI nondisplaced. RRR. No rubs. 2/6 MR. + S3. Lungs: CTAB, normal effort Abdomen: soft, NT, ND, no HSM. No bruits or masses. +BS  Extremities: no cyanosis, clubbing, rash. No edema. SCDs  Neuro: alert & oriented x 3, cranial nerves grossly intact. moves all 4 extremities w/o difficulty. Affect pleasant.  Telemetry: Reviewed, NSR with PVCs.   Labs: Basic Metabolic Panel:  Recent Labs Lab 11/12/15 0500 11/13/15 2240 11/15/15 0932 11/16/15 0220 11/16/15 2125  NA 133* 133* 135 137 132*  K 4.2 3.9 4.2 3.8 3.7  CL 100* 100* 100* 103 97*  CO2 24 23 25 27 26   GLUCOSE 161* 128* 128* 153* 160*  BUN 20 33* 26* 23* 19  CREATININE 1.03 1.17 1.05 1.03 0.96  CALCIUM 9.3 9.3 9.5 9.5  9.4  MG  --   --   --   --  1.6*    Liver Function Tests:  Recent Labs Lab 11/13/15 2240  AST 22  ALT 13*  ALKPHOS 107  BILITOT 2.2*  PROT 7.4  ALBUMIN 3.0*   No results for input(s): LIPASE, AMYLASE in the last 168 hours. No results for input(s): AMMONIA in the last 168 hours.  CBC:  Recent Labs Lab 11/11/15 0430 11/12/15 0500 11/13/15 2240 11/15/15 0932 11/16/15 0220  WBC 6.5 5.4 5.2 5.7 6.4  NEUTROABS  --   --  3.0  --   --   HGB 11.1* 10.6* 10.9* 10.7* 11.0*  HCT 36.2* 34.8* 35.9* 34.5* 35.8*  MCV 88.9 88.3 89.8 88.9 90.2  PLT 185 186 175 217 204    Cardiac Enzymes: No results for input(s): CKTOTAL, CKMB, CKMBINDEX, TROPONINI in the last 168 hours.  BNP: BNP (last 3 results)  Recent Labs  10/07/15 1227 11/03/15 1455  BNP 2346.0* 2344.5*    ProBNP (last 3 results) No results for input(s): PROBNP in the last 8760 hours.  Other results:  Imaging: No results found.   Medications:     Scheduled Medications: . ferrous sulfate  325 mg Oral Q breakfast  . isosorbide-hydrALAZINE  0.5 tablet Oral TID  . senna  1 tablet Oral BID  .  sodium chloride flush  3 mL Intravenous Q12H  . spironolactone  12.5 mg Oral Daily  . torsemide  20 mg Oral BID    Infusions:    PRN Medications: sodium chloride, acetaminophen **OR** acetaminophen, ondansetron **OR** ondansetron (ZOFRAN) IV, oxyCODONE, polyethylene glycol, potassium chloride SA, sodium chloride flush, traMADol   Assessment:   1. SDH-S/P 6/12 Lside Burr holes for evacuation.  2. 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 CAD s/p CABG 4. ETOH abuse 5. Hx of CVA - was on coumadin 6. Anemia, iron deficient    Plan/Discussion:   Post OP Day 2- bur holes for evacuation of SDH. Hgb stable.   Off milrinone. Continue bidil 1/2 tab tid, spiro 12.5 mg daily, and torsemide 20 mg twice a day. Manual BP low  but aline with SBP 130s.  Renal function stable.    Length of Stay: [place], NP-C  11/17/2015, 11:01 AM  Advanced Heart Failure Team Pager [phone] (M-F; 7a - 4p)  Please contact [facility] for night-coverage after hours (4p -7a ) and weekends on [email or web address]  Patient seen and examined with Darrick Grinder, NP. We discussed all aspects of the encounter. I agree with the assessment and plan as stated above.   Feels very good. PVCs much improved with stopping milrinone and supping magnesium. He is table for discharge from a HF perspective.   Dequita Schleicher,MD 8:01 PM

## 2015-11-18 DIAGNOSIS — I493 Ventricular premature depolarization: Secondary | ICD-10-CM

## 2015-11-18 NOTE — Care Management Important Message (Signed)
Important Message  Patient Details  Name: Rick [name] MRN: [id number] Date of Birth: [birth year]   Medicare Important Message Given:  Yes    [name] May 11/18/2015, 11:15 AM

## 2015-11-18 NOTE — Progress Notes (Signed)
Pt transferred from 39M , alert and oriented, denies any pain at this time, pt settled in bed with call light at bedside, will continue to monitor. [place], Rick [name]

## 2015-11-18 NOTE — Progress Notes (Signed)
Pt transferred to 3M10.

## 2015-11-18 NOTE — Progress Notes (Signed)
Advanced Heart Failure Rounding Note   Subjective:    Readmitted 6/10 with large acute/subacute SDH.  S/P burr hole craniotomy 6/12  Milrinone stopped 6/12 to frequent PVCs. Magnesium supped. PVCS much improved.   JP drains out. Feels better. Denies dyspnea. HA much improved.     Objective:   Weight Range:  Vital Signs:   Temp:  [98.2 F (36.8 C)-99.7 F (37.6 C)] 98.5 F (36.9 C) (06/15 1600) Pulse Rate:  [25-101] 80 (06/15 1300) Resp:  [0-26] 16 (06/15 1300) BP: (86-114)/(49-85) 95/85 mmHg (06/15 1300) SpO2:  [97 %-100 %] 100 % (06/15 1300) Weight:  [68.4 kg (150 lb 12.7 oz)] 68.4 kg (150 lb 12.7 oz) (06/15 0500) Last BM Date: 11/16/15  Weight change: Filed Weights   11/16/15 0500 11/17/15 0500 11/18/15 0500  Weight: 69.4 kg (153 lb) 69.2 kg (152 lb 8.9 oz) 68.4 kg (150 lb 12.7 oz)    Intake/Output:   Intake/Output Summary (Last 24 hours) at 11/18/15 1708 Last data filed at 11/18/15 1654  Gross per 24 hour  Intake      0 ml  Output   1580 ml  Net  -1580 ml     Physical Exam: General: Elderly appearing. NAD. In bed.  HEENT: normal Left scalp with staples and clean dressing   Neck: supple. JVP 7 Carotids 2+ bilat; no bruits. No thyromegaly or nodule noted.  Cor: PMI nondisplaced. RRR. No rub. 2/6 MR. + S3. Lungs: CTAB, normal effort Abdomen: soft, NT, ND, no HSM. No bruits or masses. +BS  Extremities: no cyanosis, clubbing, rash. No edema. SCDs  Neuro: alert & oriented x 3, cranial nerves grossly intact. moves all 4 extremities w/o difficulty. Affect pleasant.  Telemetry: Reviewed, NSR with PVCs.   Labs: Basic Metabolic Panel:  Recent Labs Lab 11/12/15 0500 11/13/15 2240 11/15/15 0932 11/16/15 0220 11/16/15 2125  NA 133* 133* 135 137 132*  K 4.2 3.9 4.2 3.8 3.7  CL 100* 100* 100* 103 97*  CO2 24 23 25 27 26   GLUCOSE 161* 128* 128* 153* 160*  BUN 20 33* 26* 23* 19  CREATININE 1.03 1.17 1.05 1.03 0.96  CALCIUM 9.3 9.3 9.5 9.5 9.4  MG   --   --   --   --  1.6*    Liver Function Tests:  Recent Labs Lab 11/13/15 2240  AST 22  ALT 13*  ALKPHOS 107  BILITOT 2.2*  PROT 7.4  ALBUMIN 3.0*   No results for input(s): LIPASE, AMYLASE in the last 168 hours. No results for input(s): AMMONIA in the last 168 hours.  CBC:  Recent Labs Lab 11/12/15 0500 11/13/15 2240 11/15/15 0932 11/16/15 0220  WBC 5.4 5.2 5.7 6.4  NEUTROABS  --  3.0  --   --   HGB 10.6* 10.9* 10.7* 11.0*  HCT 34.8* 35.9* 34.5* 35.8*  MCV 88.3 89.8 88.9 90.2  PLT 186 175 217 204    Cardiac Enzymes: No results for input(s): CKTOTAL, CKMB, CKMBINDEX, TROPONINI in the last 168 hours.  BNP: BNP (last 3 results)  Recent Labs  10/07/15 1227 11/03/15 1455  BNP 2346.0* 2344.5*    ProBNP (last 3 results) No results for input(s): PROBNP in the last 8760 hours.  Other results:  Imaging: Ct Head Wo Contrast  11/17/2015  CLINICAL DATA:  Post burr hole craniotomy. History of subdural hematoma. EXAM: CT HEAD WITHOUT CONTRAST TECHNIQUE: Contiguous axial images were obtained from the base of the skull through the vertex without intravenous contrast.  COMPARISON:  11/14/2015 FINDINGS: There are two left-sided burr holes for the subdural hematoma evacuation. There is a drain extending through the more anterior burr hole. Skin staples are present. The left extra-axial collection has markedly decreased in size and now predominantly contains gas. There is a small amount of residual high-density blood within the left extra-axial space. The sulci effacement in the left cerebral cortex has essentially resolved and there is decreased midline shift. There is 5 mm of left-to-right midline shift and there was previously 10 mm. Again noted is the encephalomalacia in the right occipital lobe. Paranasal sinuses are clear. IMPRESSION: Post evacuation of left subdural hematoma. Small amount of residual blood and gas within the left extra-axial space and a surgical drain is  present. Decreased midline shift as described. Electronically Signed   By: Markus Daft M.D.   On: 11/17/2015 15:56     Medications:     Scheduled Medications: . ferrous sulfate  325 mg Oral Q breakfast  . isosorbide-hydrALAZINE  0.5 tablet Oral TID  . senna  1 tablet Oral BID  . sodium chloride flush  3 mL Intravenous Q12H  . spironolactone  12.5 mg Oral Daily  . torsemide  20 mg Oral BID    Infusions:    PRN Medications: sodium chloride, acetaminophen **OR** acetaminophen, ondansetron **OR** ondansetron (ZOFRAN) IV, oxyCODONE, polyethylene glycol, potassium chloride SA, sodium chloride flush, traMADol   Assessment:   1. SDH-S/P 6/12 Lside Burr holes for evacuation.  2. 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 CAD s/p CABG 4. ETOH abuse 5. Hx of CVA - was on coumadin 6. Anemia, iron deficient    Plan/Discussion:    Post OP Day 3- burr hole evacuation of SDH  Doing well. PVCs much improved with stopping milrinone and supping magnesium.  Continue bidil 1/2 tab tid, spiro 12.5 mg daily, and torsemide 20 mg twice a day.  He is stable for discharge from a HF perspective. (note: current meds less than home HF regimen so will need to change home med list on d/c to match what he is on here).     Length of Stay: 5  Glori Bickers, MD 11/18/2015, 5:08 PM  Advanced Heart Failure Team Pager [phone] (M-F; 7a - 4p)  Please contact [facility] for night-coverage after hours (4p -7a ) and weekends on [email or web address]

## 2015-11-19 DIAGNOSIS — M10072 Idiopathic gout, left ankle and foot: Secondary | ICD-10-CM

## 2015-11-19 MED ORDER — TORSEMIDE 20 MG PO TABS
40.0000 mg | ORAL_TABLET | Freq: Two times a day (BID) | ORAL | Status: DC
  Administered 2015-11-19 – 2015-11-20 (×2): 40 mg via ORAL
  Filled 2015-11-19 (×2): qty 2

## 2015-11-19 MED ORDER — COLCHICINE 0.6 MG PO TABS
0.6000 mg | ORAL_TABLET | Freq: Two times a day (BID) | ORAL | Status: DC
Start: 2015-11-19 — End: 2015-11-20
  Administered 2015-11-19 – 2015-11-20 (×3): 0.6 mg via ORAL
  Filled 2015-11-19 (×3): qty 1

## 2015-11-19 MED ORDER — PREDNISONE 20 MG PO TABS
40.0000 mg | ORAL_TABLET | Freq: Once | ORAL | Status: AC
  Administered 2015-11-19: 40 mg via ORAL
  Filled 2015-11-19: qty 2

## 2015-11-19 NOTE — Progress Notes (Signed)
Advanced Heart Failure Rounding Note   Subjective:    Readmitted 6/10 with large acute/subacute SDH.  S/P burr hole craniotomy 6/12  Milrinone stopped 6/12 to frequent PVCs. Magnesium supped. PVCS much improved.   JP drains out.  Continues to feel better.  HA much improved, still having mild occasional HA. Aching in balls of feet.   Renal function remains stable.   Objective:   Weight Range:  Vital Signs:   Temp:  [98.3 F (36.8 C)-99.3 F (37.4 C)] 98.6 F (37 C) (06/16 1041) Pulse Rate:  [80-101] 87 (06/16 1041) Resp:  [16-36] 18 (06/16 1041) BP: (91-117)/(63-95) 104/66 mmHg (06/16 1041) SpO2:  [96 %-100 %] 99 % (06/16 1041) Last BM Date: 11/15/15  Weight change: Filed Weights   11/16/15 0500 11/17/15 0500 11/18/15 0500  Weight: 153 lb (69.4 kg) 152 lb 8.9 oz (69.2 kg) 150 lb 12.7 oz (68.4 kg)    Intake/Output:   Intake/Output Summary (Last 24 hours) at 11/19/15 1113 Last data filed at 11/19/15 0353  Gross per 24 hour  Intake    480 ml  Output   1101 ml  Net   -621 ml     Physical Exam: General: Elderly appearing. NAD. In bed.  HEENT: normal Left scalp with staples and clean dressing   Neck: supple. JVP 6-7 Carotids 2+ bilat; no bruits. No thyromegaly or nodule noted.  Cor: PMI nondisplaced. RRR. No rub. 2/6 MR. + S3. Lungs: Clear, normal effort Abdomen: soft, non-tender, non-distended, no HSM. No bruits or masses. +BS  Extremities: no cyanosis, clubbing, rash. No edema. SCDs  Neuro: alert & oriented x 3, cranial nerves grossly intact. moves all 4 extremities w/o difficulty. Affect pleasant.  Telemetry: Reviewed, NSR with PVCs.   Labs: Basic Metabolic Panel:  Recent Labs Lab 11/13/15 2240 11/15/15 0932 11/16/15 0220 11/16/15 2125  NA 133* 135 137 132*  K 3.9 4.2 3.8 3.7  CL 100* 100* 103 97*  CO2 23 25 27 26   GLUCOSE 128* 128* 153* 160*  BUN 33* 26* 23* 19  CREATININE 1.17 1.05 1.03 0.96  CALCIUM 9.3 9.5 9.5 9.4  MG  --   --   --   1.6*    Liver Function Tests:  Recent Labs Lab 11/13/15 2240  AST 22  ALT 13*  ALKPHOS 107  BILITOT 2.2*  PROT 7.4  ALBUMIN 3.0*   No results for input(s): LIPASE, AMYLASE in the last 168 hours. No results for input(s): AMMONIA in the last 168 hours.  CBC:  Recent Labs Lab 11/13/15 2240 11/15/15 0932 11/16/15 0220  WBC 5.2 5.7 6.4  NEUTROABS 3.0  --   --   HGB 10.9* 10.7* 11.0*  HCT 35.9* 34.5* 35.8*  MCV 89.8 88.9 90.2  PLT 175 217 204    Cardiac Enzymes: No results for input(s): CKTOTAL, CKMB, CKMBINDEX, TROPONINI in the last 168 hours.  BNP: BNP (last 3 results)  Recent Labs  10/07/15 1227 11/03/15 1455  BNP 2346.0* 2344.5*    ProBNP (last 3 results) No results for input(s): PROBNP in the last 8760 hours.  Other results:  Imaging: Ct Head Wo Contrast  11/17/2015  CLINICAL DATA:  Post burr hole craniotomy. History of subdural hematoma. EXAM: CT HEAD WITHOUT CONTRAST TECHNIQUE: Contiguous axial images were obtained from the base of the skull through the vertex without intravenous contrast. COMPARISON:  11/14/2015 FINDINGS: There are two left-sided burr holes for the subdural hematoma evacuation. There is a drain extending through the more anterior  burr hole. Skin staples are present. The left extra-axial collection has markedly decreased in size and now predominantly contains gas. There is a small amount of residual high-density blood within the left extra-axial space. The sulci effacement in the left cerebral cortex has essentially resolved and there is decreased midline shift. There is 5 mm of left-to-right midline shift and there was previously 10 mm. Again noted is the encephalomalacia in the right occipital lobe. Paranasal sinuses are clear. IMPRESSION: Post evacuation of left subdural hematoma. Small amount of residual blood and gas within the left extra-axial space and a surgical drain is present. Decreased midline shift as described. Electronically Signed    By: Markus Daft M.D.   On: 11/17/2015 15:56     Medications:     Scheduled Medications: . ferrous sulfate  325 mg Oral Q breakfast  . isosorbide-hydrALAZINE  0.5 tablet Oral TID  . senna  1 tablet Oral BID  . sodium chloride flush  3 mL Intravenous Q12H  . spironolactone  12.5 mg Oral Daily  . torsemide  20 mg Oral BID    Infusions:    PRN Medications: sodium chloride, acetaminophen **OR** acetaminophen, ondansetron **OR** ondansetron (ZOFRAN) IV, oxyCODONE, polyethylene glycol, potassium chloride SA, sodium chloride flush, traMADol   Assessment:   1. SDH-S/P 6/12 Lside Burr holes for evacuation.  2. 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 CAD s/p CABG 4. ETOH abuse 5. Hx of CVA - was on coumadin 6. Anemia, iron deficient  7. Acute gout   Plan/Discussion:    Post OP Day 4 - burr hole evacuation of SDH  He continues to improve from HF standpoint. PVCs improved once milrinone stopped and magnesium supplemented.  Continue bidil 1/2 tab tid, spiro 12.5 mg daily, and torsemide 20 mg twice a day.  He is stable for discharge from a HF perspective.   If leaves today can keep appointment in HF clinic for 11/23/15, if leaves over weekend or early next week will need to be rescheduled.   Note: current meds less than home HF regimen so will need to change home med list on d/c to match what he is on here.   Length of Stay: [place], [place] 11/19/2015, 11:13 AM  Advanced Heart Failure Team Pager [phone] (M-F; 7a - 4p)  Please contact [facility] for night-coverage after hours (4p -7a ) and weekends on [email or web address]   Patient seen and examined with Oda Kilts, PA-C. We discussed all aspects of the encounter. I agree with the assessment and plan as stated above.   He is stable. Neck veins slightly up so will increase torsemide back to home dose. Suspect he  also has acute gout in left 1st MTP. Will give one dose of prednisone and start colchicine.   Await clarification of dispo plan from [facility] team.   Nitin Mckowen,MD 8:37 PM

## 2015-11-19 NOTE — Care Management Important Message (Signed)
Important Message  Patient Details  Name: Rick [name] MRN: [id number] Date of Birth: Oct 07, 1945   Medicare Important Message Given:  Yes    Loann Quill 11/19/2015, 9:26 AM

## 2015-11-20 LAB — BASIC METABOLIC PANEL
Anion gap: 11 (ref 5–15)
BUN: 25 mg/dL — ABNORMAL HIGH (ref 6–20)
CHLORIDE: 92 mmol/L — AB (ref 101–111)
CO2: 26 mmol/L (ref 22–32)
Calcium: 9.7 mg/dL (ref 8.9–10.3)
Creatinine, Ser: 0.97 mg/dL (ref 0.61–1.24)
Glucose, Bld: 167 mg/dL — ABNORMAL HIGH (ref 65–99)
POTASSIUM: 4 mmol/L (ref 3.5–5.1)
SODIUM: 129 mmol/L — AB (ref 135–145)

## 2015-11-20 MED ORDER — OXYCODONE HCL 5 MG PO TABS: 5.0000 mg | ORAL_TABLET | ORAL | Status: AC | PRN

## 2015-11-20 NOTE — Discharge Summary (Signed)
Physician Discharge Summary  Patient ID: Rick [name] MRN: [id number] DOB/AGE: June 22, 1945 70 y.o.  Admit date: 11/13/2015 Discharge date: 11/20/2015  Admission Diagnoses:Subdural hematoma, congestive heart failure  Discharge Diagnoses: The same Active Problems:   Subdural hematoma (HCC)   Chronic combined systolic and diastolic CHF (congestive heart failure) (HCC)   Coronary artery disease involving coronary bypass graft of native heart without angina pectoris   Discharged Condition: good  Hospital Course: The patient was admitted with a subdural hematoma and heart failure. He is observed. Heart L distal for his heart failure.  On 11/20/2015 the patient requested discharge home. He was given discharge instructions.  Consults: Cardiology Significant Diagnostic Studies: Head CT Treatments: Observation Discharge Exam: Blood pressure 103/74, pulse 81, temperature 98.4 F (36.9 C), temperature source Oral, resp. rate 16, height 5\' 11"  (1.803 m), weight 66.7 kg (147 lb 0.8 oz), SpO2 100 %. The patient is alert and oriented. He is moving all 4 extremities well. His speech is normal.  Disposition: Home  Discharge Instructions    Call MD for:  difficulty breathing, headache or visual disturbances    Complete by:  As directed      Call MD for:  extreme fatigue    Complete by:  As directed      Call MD for:  hives    Complete by:  As directed      Call MD for:  persistant dizziness or light-headedness    Complete by:  As directed      Call MD for:  persistant nausea and vomiting    Complete by:  As directed      Call MD for:  redness, tenderness, or signs of infection (pain, swelling, redness, odor or green/yellow discharge around incision site)    Complete by:  As directed      Call MD for:  severe uncontrolled pain    Complete by:  As directed      Call MD for:  temperature >100.4    Complete by:  As directed      Diet - low sodium heart healthy    Complete by:  As directed       Discharge instructions    Complete by:  As directed   Call [phone] for a followup appointment. Take a stool softener while you are using pain medications.     Driving Restrictions    Complete by:  As directed   Do not drive for 2 weeks.     Increase activity slowly    Complete by:  As directed      Lifting restrictions    Complete by:  As directed   Do not lift more than 5 pounds. No excessive bending or twisting.     May shower / Bathe    Complete by:  As directed   He may shower after the pain she is removed 3 days after surgery. Leave the incision alone.     No dressing needed    Complete by:  As directed             Medication List    STOP taking these medications        aspirin 81 MG tablet     traMADol 50 MG tablet  Commonly known as:  ULTRAM     warfarin 3 MG tablet  Commonly known as:  COUMADIN      TAKE these medications        ferrous sulfate 325 (65 FE) MG tablet  Take 325 mg by mouth daily with breakfast.     isosorbide-hydrALAZINE 20-37.5 MG tablet  Commonly known as:  BIDIL  Take 1 tablet by mouth 3 (three) times daily.     metolazone 5 MG tablet  Commonly known as:  ZAROXOLYN  Take 0.5 tablets (2.5 mg total) by mouth as needed. For weight gain of 3 lbs overnight or 5 lbs within one week.     oxyCODONE 5 MG immediate release tablet  Commonly known as:  Oxy IR/ROXICODONE  Take 1 tablet (5 mg total) by mouth every 4 (four) hours as needed for moderate pain.     polyethylene glycol packet  Commonly known as:  MIRALAX / GLYCOLAX  Take 17 g by mouth daily as needed for mild constipation.     potassium chloride SA 20 MEQ tablet  Commonly known as:  K-DUR,KLOR-CON  Take 20 mEq by mouth as needed (hand cramping).     spironolactone 25 MG tablet  Commonly known as:  ALDACTONE  Take 0.5 tablets (12.5 mg total) by mouth daily.     torsemide 20 MG tablet  Commonly known as:  DEMADEX  Take 2 tablets (40 mg total) by mouth 2 (two) times  daily.         SignedNewman Pies D 11/20/2015, 7:50 AM

## 2015-11-20 NOTE — Progress Notes (Signed)
Pt discharge education and instructions completed with pt and spouse at bedside; both voices understanding and denies any questions. Pt IV removed; left head incision with staples clean, dry and intact. Foam dsg to removed drain site changed; no active bleeding or drainage noted to site. Pt handed his prescription for oxycodone. Pt discharge home with spouse to transport him home. Pt transported off unit via wheelchair with his belongings to the side including his cane. Delia Heady RN

## 2015-11-20 NOTE — Progress Notes (Signed)
Patient A/O, no noted distress. Denies pain. Slept well. Patient tolerated meds well. Staff will continue to monitor and meet needs. Patient uses the urinal.

## 2015-11-23 ENCOUNTER — Encounter (HOSPITAL_COMMUNITY): Payer: Medicare Other | Admitting: Internal Medicine

## 2015-11-24 ENCOUNTER — Telehealth (HOSPITAL_COMMUNITY): Payer: Self-pay

## 2015-11-25 NOTE — Telephone Encounter (Signed)
Entered in error

## 2015-11-26 ENCOUNTER — Telehealth (HOSPITAL_COMMUNITY): Payer: Self-pay

## 2015-11-26 MED ORDER — ISOSORB DINITRATE-HYDRALAZINE 20-37.5 MG PO TABS: 1.0000 | ORAL_TABLET | Freq: Three times a day (TID) | ORAL | Status: DC

## 2015-11-26 NOTE — Telephone Encounter (Signed)
Patient's BiDil copay is too expensive and patient does not have prescription drug coverage. Spoke with patient about copay assistance program and that the copay would be $40/month and he will haev to get this medication from [facility] in [place], [place].   Blanchie Zeleznik C. Lennox Grumbles, PharmD Pharmacy Resident  Pager: [phone] 11/26/2015 3:21 PM

## 2015-12-09 ENCOUNTER — Ambulatory Visit (HOSPITAL_COMMUNITY)
Admission: RE | Admit: 2015-12-09 | Discharge: 2015-12-09 | Disposition: A | Discharge status: Home / Self Care | Payer: Medicare Other | Source: Ambulatory Visit | Attending: Internal Medicine | Admitting: Internal Medicine

## 2015-12-09 VITALS — BP 92/62 | HR 70 | Resp 18 | Wt 170.2 lb

## 2015-12-09 DIAGNOSIS — Z86711 Personal history of pulmonary embolism: Secondary | ICD-10-CM | POA: Insufficient documentation

## 2015-12-09 DIAGNOSIS — I5042 Chronic combined systolic (congestive) and diastolic (congestive) heart failure: Secondary | ICD-10-CM | POA: Diagnosis not present

## 2015-12-09 DIAGNOSIS — Z87891 Personal history of nicotine dependence: Secondary | ICD-10-CM | POA: Diagnosis not present

## 2015-12-09 DIAGNOSIS — I272 Other secondary pulmonary hypertension: Secondary | ICD-10-CM | POA: Insufficient documentation

## 2015-12-09 DIAGNOSIS — I110 Hypertensive heart disease with heart failure: Secondary | ICD-10-CM | POA: Diagnosis not present

## 2015-12-09 DIAGNOSIS — I252 Old myocardial infarction: Secondary | ICD-10-CM | POA: Insufficient documentation

## 2015-12-09 DIAGNOSIS — S065XAA Traumatic subdural hemorrhage with loss of consciousness status unknown, initial encounter: Secondary | ICD-10-CM

## 2015-12-09 DIAGNOSIS — Z95810 Presence of automatic (implantable) cardiac defibrillator: Secondary | ICD-10-CM | POA: Insufficient documentation

## 2015-12-09 DIAGNOSIS — E785 Hyperlipidemia, unspecified: Secondary | ICD-10-CM | POA: Diagnosis not present

## 2015-12-09 DIAGNOSIS — Z7901 Long term (current) use of anticoagulants: Secondary | ICD-10-CM | POA: Diagnosis not present

## 2015-12-09 DIAGNOSIS — I2510 Atherosclerotic heart disease of native coronary artery without angina pectoris: Secondary | ICD-10-CM | POA: Diagnosis not present

## 2015-12-09 DIAGNOSIS — Z8673 Personal history of transient ischemic attack (TIA), and cerebral infarction without residual deficits: Secondary | ICD-10-CM | POA: Diagnosis not present

## 2015-12-09 DIAGNOSIS — I5043 Acute on chronic combined systolic (congestive) and diastolic (congestive) heart failure: Secondary | ICD-10-CM

## 2015-12-09 DIAGNOSIS — I255 Ischemic cardiomyopathy: Secondary | ICD-10-CM

## 2015-12-09 DIAGNOSIS — I5022 Chronic systolic (congestive) heart failure: Secondary | ICD-10-CM

## 2015-12-09 DIAGNOSIS — I6200 Nontraumatic subdural hemorrhage, unspecified: Secondary | ICD-10-CM | POA: Diagnosis not present

## 2015-12-09 DIAGNOSIS — N179 Acute kidney failure, unspecified: Secondary | ICD-10-CM | POA: Diagnosis not present

## 2015-12-09 DIAGNOSIS — G4730 Sleep apnea, unspecified: Secondary | ICD-10-CM | POA: Diagnosis not present

## 2015-12-09 DIAGNOSIS — F419 Anxiety disorder, unspecified: Secondary | ICD-10-CM | POA: Diagnosis not present

## 2015-12-09 DIAGNOSIS — S065X9A Traumatic subdural hemorrhage with loss of consciousness of unspecified duration, initial encounter: Secondary | ICD-10-CM

## 2015-12-09 LAB — BASIC METABOLIC PANEL
ANION GAP: 10 (ref 5–15)
BUN: 59 mg/dL — ABNORMAL HIGH (ref 6–20)
CALCIUM: 9.6 mg/dL (ref 8.9–10.3)
CO2: 24 mmol/L (ref 22–32)
Chloride: 99 mmol/L — ABNORMAL LOW (ref 101–111)
Creatinine, Ser: 1.94 mg/dL — ABNORMAL HIGH (ref 0.61–1.24)
GFR calc Af Amer: 39 mL/min — ABNORMAL LOW (ref >60)
GFR, EST NON AFRICAN AMERICAN: 34 mL/min — AB (ref >60)
GLUCOSE: 92 mg/dL (ref 65–99)
POTASSIUM: 4.3 mmol/L (ref 3.5–5.1)
SODIUM: 133 mmol/L — AB (ref 135–145)

## 2015-12-09 LAB — CBC
HCT: 30.8 % — ABNORMAL LOW (ref 39.0–52.0)
Hemoglobin: 9.8 g/dL — ABNORMAL LOW (ref 13.0–17.0)
MCH: 29.2 pg (ref 26.0–34.0)
MCHC: 31.8 g/dL (ref 30.0–36.0)
MCV: 91.7 fL (ref 78.0–100.0)
PLATELETS: 153 10*3/uL (ref 150–400)
RBC: 3.36 MIL/uL — AB (ref 4.22–5.81)
RDW: 17.1 % — AB (ref 11.5–15.5)
WBC: 5.7 10*3/uL (ref 4.0–10.5)

## 2015-12-09 LAB — BRAIN NATRIURETIC PEPTIDE: B NATRIURETIC PEPTIDE 5: 654.3 pg/mL — AB (ref 0.0–100.0)

## 2015-12-09 MED ORDER — POTASSIUM CHLORIDE CRYS ER 20 MEQ PO TBCR
20.0000 meq | EXTENDED_RELEASE_TABLET | Freq: Once | ORAL | Status: AC
  Administered 2015-12-09: 20 meq via ORAL
  Filled 2015-12-09: qty 1

## 2015-12-09 MED ORDER — TORSEMIDE 20 MG PO TABS: 60.0000 mg | ORAL_TABLET | Freq: Two times a day (BID) | ORAL | Status: DC

## 2015-12-09 MED ORDER — FUROSEMIDE 10 MG/ML IJ SOLN
80.0000 mg | Freq: Once | INTRAMUSCULAR | Status: AC
  Administered 2015-12-09: 80 mg via INTRAVENOUS
  Filled 2015-12-09: qty 8

## 2015-12-09 NOTE — Progress Notes (Signed)
Patient ID: Rick [name], male   DOB: 1946/04/04, 70 y.o.   MRN: [id number] Patient ID: Rick [name], male   DOB: Jun 02, 1946, 70 y.o.   MRN: [id number]     Advanced Heart Failure Clinic Note   Referring Physician: Dr Harl Bowie Primary Care: Wende Neighbors, MD Primary Cardiologist: Dr Harl Bowie  HPI: Rick [name] is a 70 y.o. male with history of ischemic cardiomyopathy status post CABG in 2003 as well as previous stenting. Most recent 2-D echo in 2016 EF 25% with restrictive diastolic dysfunction, moderate RV dysfunction PAS P 64, status post ICD followed by Dr. Rayann Heman with normal check in 02/2015. In June 2017 had subdural hema  Pt admitted 5/4 - 10/13/15 with recurrent volume overload in setting of dietary non-compliance. Echo during that admission showed EF worsening to 15-20% from 25%.  Seen in Dr. Harl Bowie office 10/20/15. Mild volume overload with dietary indiscretion. Torsemide increased to 60/40 mg. Weight at that visit 159 lbs  Admitted 6/10 through 11/20/15 with subdural hematoma and heart failure. Had evacuation of subdural hematoma. Required short term inotropes.  He was discharged off bb and ace. Discharge weight was 147 pounds.   He presents today for regular follow up. Overall feeling ok but complaining of dizziness. Denies SOB/PND/Orthopnea. Weight has gone up from 146-170 pounds. His wife says he is eating high salt foods. Drinking extra fluids. Taking all meds. Was not discharged on bb or ace but he started taking his home bb and ace after he went home. Has not had metolazone since discharge.   Echo 10/08/15 LVEF 15-20%, Severe LV dilation, Severe hypokinesis with areas of akinesis, Trivial AI, Mild MR, Mod LAE, Severe RV dilation, Severe TR, PA peak pressure 36 mm Hg Echo 03/04/15 LVEF 25%,  Severe hypokinesis, Trivial AI, Mild MR, RV mod dilated and mod reduced, Severe RAE, Mod TR, PA peak pressure 64 mm Hg.    Past Medical History  Diagnosis Date  . Essential hypertension   . Ischemic  cardiomyopathy     EF 20% by echo 2013  . Other pulmonary embolism and infarction     On coumadin  . Hyperlipidemia   . DOE (dyspnea on exertion)   . CAD (coronary artery disease)   . Anxiety   . Tricuspid regurgitation     Severe  . Pulmonary hypertension (HCC)     56 mm Hg  . AICD (automatic cardioverter/defibrillator) present   . Family history of adverse reaction to anesthesia     "sister would go into seizures"  . CHF (congestive heart failure) ([place])   . Myocardial infarction acute ([place]) 2003  . Sleep apnea     "tried machine a couple nights; couldn't do it" (11/03/2015)  . 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 Stroke [facility])     left side facial droop since; don't know when it was" (11/03/2015)    Current Outpatient Prescriptions  Medication Sig Dispense Refill  . carvedilol (COREG) 6.25 MG tablet Take 3.125 mg by mouth 2 (two) times daily with a meal.    . ferrous sulfate 325 (65 FE) MG tablet Take 325 mg by mouth daily with breakfast.    . isosorbide-hydrALAZINE (BIDIL) 20-37.5 MG tablet Take 1 tablet by mouth 3 (three) times daily. 90 tablet 6  . lisinopril (PRINIVIL,ZESTRIL) 5 MG tablet Take 5 mg by mouth daily.    . magnesium oxide (MAG-OX) 400 MG tablet Take 400 mg by mouth daily.    [name] oxyCODONE (OXY IR/ROXICODONE) 5 MG immediate  release tablet Take 1 tablet (5 mg total) by mouth every 4 (four) hours as needed for moderate pain. 50 tablet 0  . potassium chloride SA (K-DUR,KLOR-CON) 20 MEQ tablet Take 20 mEq by mouth as needed (hand cramping).    [name] spironolactone (ALDACTONE) 25 MG tablet Take 0.5 tablets (12.5 mg total) by mouth daily. 18 tablet 6  . torsemide (DEMADEX) 20 MG tablet Take 2 tablets (40 mg total) by mouth 2 (two) times daily. 130 tablet 6  . warfarin (COUMADIN) 3 MG tablet Take 3 mg by mouth daily.    . metolazone (ZAROXOLYN) 5 MG tablet Take 0.5 tablets (2.5 mg total) by mouth as needed. For weight gain of 3 lbs overnight or 5 lbs within one week. (Patient not  taking: Reported on 12/09/2015) 6 tablet 3  . polyethylene glycol (MIRALAX / GLYCOLAX) packet Take 17 g by mouth daily as needed for mild constipation. Reported on 12/09/2015     No current facility-administered medications for this encounter.    No Known Allergies    Social History   Social History  . Marital Status: Married    Spouse Name: N/A  . Number of Children: 4  . Years of Education: N/A   Occupational History  . Disabled.    Social History Main Topics  . Smoking status: Former Smoker -- 0.50 packs/day for 38 years    Types: Cigarettes    Start date: 12/30/1956    Quit date: 06/06/1995  . Smokeless tobacco: Never Used  . Alcohol Use: 1.8 oz/week    3 Cans of beer, 0 Standard drinks or equivalent per week     Comment: 11/03/2015 "Quit from [id number]; weaning myself off again"  . Drug Use: No  . Sexual Activity: Not Currently   Other Topics Concern  . Not on file   Social History Narrative   Lives with wife and brother in [place] and grandson in [place].        Family History  Problem Relation Age of Onset  . Heart disease Mother     CAD  . Leukemia Father     Danley Danker Vitals:   12/09/15 1132  BP: 92/62  Pulse: 70  Resp: 18  Weight: 170 lb 4 oz (77.225 kg)  SpO2: 100%   Wt Readings from Last 3 Encounters:  12/09/15 170 lb 4 oz (77.225 kg)  11/20/15 145 lb 1.6 oz (65.817 kg)  11/12/15 150 lb 4.8 oz (68.176 kg)     PHYSICAL EXAM: General:  Elderly appearing. NAD. Wife present.   HEENT: normal Neck: supple. JVP to ear. Carotids 2+ bilat; no bruits. No lymphadenopathy or thyromegaly appreciated. Cor: PMI nondisplaced. RRR. No rubs. 2/6 MR. + S3. Lungs: Decreased in the bases.   Abdomen: soft, NT, ND, no HSM. No bruits or masses. +BS  Extremities: no cyanosis, clubbing, rash. R and LLE 1+ edema.  Neuro: alert & oriented x 3, cranial nerves grossly intact. moves all 4 extremities w/o difficulty. Affect pleasant.   ASSESSMENT & PLAN: 1.  Acute/Chronic Combined CHF - Echo 10/08/15 LVEF 15-20% with ICM NYHA IIIb. CorVue elevatedVolume overloaded today in the setting of high salt diet. Weight up 20 pounds in the last week. Give 80 mg IV lasix now + 20 meq potassium .  BP soft. Discharged June 17th off bb and ace however he continued to take. Today I am stopping spiro, lisinopril, bidil, and carvedilol.  Increase torsemide to 60 mg twice a day  Check BMET, BNP now.  2. CAD s/p CABG- no CP. On aspirin and statin.  3. HTN- low today in the setting of medication confusion. Taking bb and ace. As above we are hold for now.  4. ETOH abuse 5. Hx of CVA 6. Subdural hematoma- S/P evacuation hematoma on 11/15/15. Has follow up with Dr Arnoldo Morale.  7. AKI  Referred back to [facility] for HF , telemedicine and diuretic protocol.   Follow up tomorrow to reassess. Creatinine elevated from 0.9>1.9.Likely because he was taking lisinopril whenit was stopped on discharge. I dont think this is low output but if he looks worse tomorrow will need hospital admit. Good response to IV lasix with > 700 cc urine output.    Cigi Bega NP-C 4:07 PM

## 2015-12-09 NOTE — Progress Notes (Signed)
24g PIV started in [facility] per Amy Clegg NP-C for IV diuresis. 80 mg iv lasix and 20 mg K administered per protocol. Total Urinary output: 775 cc PIV DC'd and clean dry gauze dressing applied before patient left OV.  Renee Pain

## 2015-12-09 NOTE — Progress Notes (Signed)
Advanced Heart Failure Medication Review by a Pharmacist  Does the patient  feel that his/her medications are working for him/her?  yes  Has the patient been experiencing any side effects to the medications prescribed?  yes  Does the patient measure his/her own blood pressure or blood glucose at home?  no   Does the patient have any problems obtaining medications due to transportation or finances?   no  Understanding of regimen: good Understanding of indications: good Potential of compliance: good Patient understands to avoid NSAIDs. Patient understands to avoid decongestants.  Issues to address at subsequent visits: None   Pharmacist comments:  Rick [name] is a pleasant 70 yo M presenting with his wife and his medication bottles. He reports good compliance with his regimen but I did notice that he has continued to take carvedilol and lisinopril which were previously discontinued. All discrepancies reconciled and he did not have any specific medication-related questions or concerns for me at this time.   [name]. Velva Harman, PharmD, BCPS, CPP Clinical Pharmacist Pager: [phone] Phone: [phone] 12/09/2015 12:08 PM      Time with patient: 10 minutes Preparation and documentation time: 2 minutes Total time: 12 minutes

## 2015-12-09 NOTE — Patient Instructions (Addendum)
STOP LISINOPRIL.  STOP BIDIL.  STOP CARVEDILOL.  STOP SPIRONOLACTONE.  INCREASE Torsemide to 60 mg (3 tabs) twice daily.  Return tomorrow for follow up on weight/fluid level and repeat labs.  Do the following things EVERYDAY: 1) Weigh yourself in the morning before breakfast. Write it down and keep it in a log. 2) Take your medicines as prescribed 3) Eat low salt foods-Limit salt (sodium) to 2000 mg per day.  4) Stay as active as you can everyday 5) Limit all fluids for the day to less than 2 liters

## 2015-12-10 ENCOUNTER — Ambulatory Visit (HOSPITAL_COMMUNITY)
Admission: RE | Admit: 2015-12-10 | Discharge: 2015-12-10 | Disposition: A | Discharge status: Home / Self Care | Payer: Medicare Other | Source: Ambulatory Visit | Attending: Internal Medicine | Admitting: Internal Medicine

## 2015-12-10 ENCOUNTER — Encounter (HOSPITAL_COMMUNITY): Payer: Self-pay | Admitting: Internal Medicine

## 2015-12-10 VITALS — BP 100/70 | HR 66 | Wt 167.0 lb

## 2015-12-10 DIAGNOSIS — I272 Other secondary pulmonary hypertension: Secondary | ICD-10-CM | POA: Insufficient documentation

## 2015-12-10 DIAGNOSIS — Z951 Presence of aortocoronary bypass graft: Secondary | ICD-10-CM | POA: Diagnosis not present

## 2015-12-10 DIAGNOSIS — E785 Hyperlipidemia, unspecified: Secondary | ICD-10-CM | POA: Insufficient documentation

## 2015-12-10 DIAGNOSIS — N179 Acute kidney failure, unspecified: Secondary | ICD-10-CM | POA: Diagnosis not present

## 2015-12-10 DIAGNOSIS — Z86711 Personal history of pulmonary embolism: Secondary | ICD-10-CM | POA: Diagnosis not present

## 2015-12-10 DIAGNOSIS — Z87891 Personal history of nicotine dependence: Secondary | ICD-10-CM | POA: Diagnosis not present

## 2015-12-10 DIAGNOSIS — I5042 Chronic combined systolic (congestive) and diastolic (congestive) heart failure: Secondary | ICD-10-CM | POA: Insufficient documentation

## 2015-12-10 DIAGNOSIS — Z7901 Long term (current) use of anticoagulants: Secondary | ICD-10-CM | POA: Diagnosis not present

## 2015-12-10 DIAGNOSIS — Z7982 Long term (current) use of aspirin: Secondary | ICD-10-CM | POA: Diagnosis not present

## 2015-12-10 DIAGNOSIS — Z95810 Presence of automatic (implantable) cardiac defibrillator: Secondary | ICD-10-CM | POA: Insufficient documentation

## 2015-12-10 DIAGNOSIS — I252 Old myocardial infarction: Secondary | ICD-10-CM | POA: Insufficient documentation

## 2015-12-10 DIAGNOSIS — I110 Hypertensive heart disease with heart failure: Secondary | ICD-10-CM | POA: Diagnosis not present

## 2015-12-10 DIAGNOSIS — I2510 Atherosclerotic heart disease of native coronary artery without angina pectoris: Secondary | ICD-10-CM | POA: Diagnosis not present

## 2015-12-10 DIAGNOSIS — Z79899 Other long term (current) drug therapy: Secondary | ICD-10-CM | POA: Insufficient documentation

## 2015-12-10 DIAGNOSIS — Z955 Presence of coronary angioplasty implant and graft: Secondary | ICD-10-CM | POA: Diagnosis not present

## 2015-12-10 DIAGNOSIS — G4733 Obstructive sleep apnea (adult) (pediatric): Secondary | ICD-10-CM | POA: Insufficient documentation

## 2015-12-10 DIAGNOSIS — Z8249 Family history of ischemic heart disease and other diseases of the circulatory system: Secondary | ICD-10-CM | POA: Insufficient documentation

## 2015-12-10 DIAGNOSIS — I255 Ischemic cardiomyopathy: Secondary | ICD-10-CM | POA: Insufficient documentation

## 2015-12-10 DIAGNOSIS — F1010 Alcohol abuse, uncomplicated: Secondary | ICD-10-CM | POA: Diagnosis not present

## 2015-12-10 DIAGNOSIS — Z8673 Personal history of transient ischemic attack (TIA), and cerebral infarction without residual deficits: Secondary | ICD-10-CM | POA: Diagnosis not present

## 2015-12-10 LAB — BASIC METABOLIC PANEL
ANION GAP: 9 (ref 5–15)
BUN: 64 mg/dL — AB (ref 6–20)
CO2: 25 mmol/L (ref 22–32)
Calcium: 9.9 mg/dL (ref 8.9–10.3)
Chloride: 101 mmol/L (ref 101–111)
Creatinine, Ser: 1.76 mg/dL — ABNORMAL HIGH (ref 0.61–1.24)
GFR calc Af Amer: 44 mL/min — ABNORMAL LOW (ref >60)
GFR, EST NON AFRICAN AMERICAN: 38 mL/min — AB (ref >60)
Glucose, Bld: 155 mg/dL — ABNORMAL HIGH (ref 65–99)
POTASSIUM: 4.1 mmol/L (ref 3.5–5.1)
SODIUM: 135 mmol/L (ref 135–145)

## 2015-12-10 NOTE — Progress Notes (Signed)
Patient ID: Rick [name], male   DOB: July 18, 1945, 70 y.o.   MRN: [id number]    Advanced Heart Failure Clinic Note   Referring Physician: Dr Harl Bowie Primary Care: Wende Neighbors, MD Primary Cardiologist: Dr Harl Bowie  HPI: Rick [name] is a 70 y.o. male with history of ischemic cardiomyopathy status post CABG in 2003 as well as previous stenting. Most recent 2-D echo in 2016 EF 25% with restrictive diastolic dysfunction, moderate RV dysfunction PAS P 64, status post ICD followed by Dr. Rayann Heman with normal check in 02/2015. In June 2017 had subdural hema  Pt admitted 5/4 - 10/13/15 with recurrent volume overload in setting of dietary non-compliance. Echo during that admission showed EF worsening to 15-20% from 25%.  Seen in Dr. Harl Bowie office 10/20/15. Mild volume overload with dietary indiscretion. Torsemide increased to 60/40 mg. Weight at that visit 159 lbs  Admitted 6/10 through 11/20/15 with subdural hematoma and heart failure. Had evacuation of subdural hematoma. Required short term inotropes.  He was discharged off bb and ace. Discharge weight was 147 pounds.   He presents today for follow up. Yesterday he was given 80 mg IV lasix due to 20 pound weight gain. Also bidil, coreg, lisinopril, and spiro stopped due to hypotension. Overall feeling better. Denies SOB/PND/Orthopnea. Weight at home went down from 170 to 165 pounds. Taking medications as directed.   Echo 10/08/15 LVEF 15-20%, Severe LV dilation, Severe hypokinesis with areas of akinesis, Trivial AI, Mild MR, Mod LAE, Severe RV dilation, Severe TR, PA peak pressure 36 mm Hg Echo 03/04/15 LVEF 25%,  Severe hypokinesis, Trivial AI, Mild MR, RV mod dilated and mod reduced, Severe RAE, Mod TR, PA peak pressure 64 mm Hg.    Past Medical History  Diagnosis Date  . Essential hypertension   . Ischemic cardiomyopathy     EF 20% by echo 2013  . Other pulmonary embolism and infarction     On coumadin  . Hyperlipidemia   . DOE (dyspnea on exertion)     . CAD (coronary artery disease)   . Anxiety   . Tricuspid regurgitation     Severe  . Pulmonary hypertension (HCC)     56 mm Hg  . AICD (automatic cardioverter/defibrillator) present   . Family history of adverse reaction to anesthesia     "sister would go into seizures"  . CHF (congestive heart failure) ([place])   . Myocardial infarction acute ([place]) 2003  . Sleep apnea     "tried machine a couple nights; couldn't do it" (11/03/2015)  . 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 Stroke [facility])     left side facial droop since; don't know when it was" (11/03/2015)    Current Outpatient Prescriptions  Medication Sig Dispense Refill  . ferrous sulfate 325 (65 FE) MG tablet Take 325 mg by mouth daily with breakfast.    . magnesium oxide (MAG-OX) 400 MG tablet Take 400 mg by mouth daily.    . metolazone (ZAROXOLYN) 5 MG tablet Take 0.5 tablets (2.5 mg total) by mouth as needed. For weight gain of 3 lbs overnight or 5 lbs within one week. 6 tablet 3  . oxyCODONE (OXY IR/ROXICODONE) 5 MG immediate release tablet Take 1 tablet (5 mg total) by mouth every 4 (four) hours as needed for moderate pain. 50 tablet 0  . polyethylene glycol (MIRALAX / GLYCOLAX) packet Take 17 g by mouth daily as needed for mild constipation. Reported on 12/09/2015    . potassium chloride SA (K-DUR,KLOR-CON) 20  MEQ tablet Take 20 mEq by mouth as needed (hand cramping).    . torsemide (DEMADEX) 20 MG tablet Take 3 tablets (60 mg total) by mouth 2 (two) times daily. 180 tablet 6  . warfarin (COUMADIN) 3 MG tablet Take 3 mg by mouth daily.     No current facility-administered medications for this encounter.    No Known Allergies    Social History   Social History  . Marital Status: Married    Spouse Name: N/A  . Number of Children: 4  . Years of Education: N/A   Occupational History  . Disabled.    Social History Main Topics  . Smoking status: Former Smoker -- 0.50 packs/day for 38 years    Types: Cigarettes    Start  date: 12/30/1956    Quit date: 06/06/1995  . Smokeless tobacco: Never Used  . Alcohol Use: 1.8 oz/week    3 Cans of beer, 0 Standard drinks or equivalent per week     Comment: 11/03/2015 "Quit from [id number]; weaning myself off again"  . Drug Use: No  . Sexual Activity: Not Currently   Other Topics Concern  . Not on file   Social History Narrative   Lives with wife and brother in [place] and grandson in [place].        Family History  Problem Relation Age of Onset  . Heart disease Mother     CAD  . Leukemia Father     Filed Vitals:   12/10/15 0905  BP: 100/70  Pulse: 66  Weight: 167 lb (75.751 kg)  SpO2: 100%   Wt Readings from Last 3 Encounters:  12/10/15 167 lb (75.751 kg)  12/09/15 170 lb 4 oz (77.225 kg)  11/20/15 145 lb 1.6 oz (65.817 kg)     PHYSICAL EXAM: General:  Elderly appearing. NAD. Wife present.   HEENT: normal Neck: supple. JVP ~10 . Carotids 2+ bilat; no bruits. No lymphadenopathy or thyromegaly appreciated. Cor: PMI nondisplaced. RRR. No rubs. 2/6 MR. + S3. Lungs: Decreased in the bases.   Abdomen: soft, NT, ND, no HSM. No bruits or masses. +BS  Extremities: no cyanosis, clubbing, rash. R and LLE edema.  Neuro: alert & oriented x 3, cranial nerves grossly intact. moves all 4 extremities w/o difficulty. Affect pleasant.   ASSESSMENT & PLAN: 1. /Chronic Combined CHF - Echo 10/08/15 LVEF 15-20% with ICM NYHA IIIb.Volume status improving. Continue torsemide to 60 mg twice a day and he will take metolazone 2.5 mg tomorrow with an extra 20 meq of potassium.  Hold bb and bidil for now.  Keep off sprio and lisinopril with elevated creatinine.  BMET today. Creatinine coming down  2. CAD s/p CABG- no CP. On aspirin and statin.  3. HTN- low today in the setting of medication confusion. Holding bb, bidil. and ace.  4. ETOH abuse- has not had any alcohol since discharge. Needs to stay off alcohol.  5. Hx of CVA 6. Subdural hematoma- S/P evacuation  hematoma on 11/15/15. Has follow up with Dr Arnoldo Morale.  7. AKI- BMET now. Creatinine coming down 1.9>1.7 8. OSA- Intolerant CPAP.   Follow up in 1 week. BMET at that time. If volume status improved can add back coreg . Check Corvue.   Amy Clegg NP-C 9:29 AM

## 2015-12-10 NOTE — Patient Instructions (Signed)
Take Metolazone tomorrow (Sat 7/8) along with an extra 20 meq (1 tab) of Potassium  Your physician recommends that you schedule a follow-up appointment in: 1 week

## 2015-12-16 ENCOUNTER — Ambulatory Visit (HOSPITAL_COMMUNITY)
Admission: RE | Admit: 2015-12-16 | Discharge: 2015-12-16 | Disposition: A | Discharge status: Home / Self Care | Payer: Medicare Other | Source: Ambulatory Visit | Attending: Cardiology | Admitting: Cardiology

## 2015-12-16 ENCOUNTER — Encounter (HOSPITAL_COMMUNITY): Payer: Self-pay

## 2015-12-16 VITALS — BP 120/80 | HR 79 | Ht 71.0 in | Wt 161.0 lb

## 2015-12-16 DIAGNOSIS — I2510 Atherosclerotic heart disease of native coronary artery without angina pectoris: Secondary | ICD-10-CM | POA: Insufficient documentation

## 2015-12-16 DIAGNOSIS — I5042 Chronic combined systolic (congestive) and diastolic (congestive) heart failure: Secondary | ICD-10-CM | POA: Diagnosis not present

## 2015-12-16 DIAGNOSIS — N179 Acute kidney failure, unspecified: Secondary | ICD-10-CM | POA: Insufficient documentation

## 2015-12-16 DIAGNOSIS — E785 Hyperlipidemia, unspecified: Secondary | ICD-10-CM | POA: Insufficient documentation

## 2015-12-16 DIAGNOSIS — Z7982 Long term (current) use of aspirin: Secondary | ICD-10-CM | POA: Insufficient documentation

## 2015-12-16 DIAGNOSIS — Z7901 Long term (current) use of anticoagulants: Secondary | ICD-10-CM | POA: Diagnosis not present

## 2015-12-16 DIAGNOSIS — Z87891 Personal history of nicotine dependence: Secondary | ICD-10-CM | POA: Diagnosis not present

## 2015-12-16 DIAGNOSIS — N189 Chronic kidney disease, unspecified: Secondary | ICD-10-CM | POA: Diagnosis not present

## 2015-12-16 DIAGNOSIS — G4733 Obstructive sleep apnea (adult) (pediatric): Secondary | ICD-10-CM | POA: Insufficient documentation

## 2015-12-16 DIAGNOSIS — Z951 Presence of aortocoronary bypass graft: Secondary | ICD-10-CM | POA: Insufficient documentation

## 2015-12-16 DIAGNOSIS — Z8673 Personal history of transient ischemic attack (TIA), and cerebral infarction without residual deficits: Secondary | ICD-10-CM | POA: Insufficient documentation

## 2015-12-16 DIAGNOSIS — I255 Ischemic cardiomyopathy: Secondary | ICD-10-CM | POA: Insufficient documentation

## 2015-12-16 DIAGNOSIS — Z95810 Presence of automatic (implantable) cardiac defibrillator: Secondary | ICD-10-CM | POA: Insufficient documentation

## 2015-12-16 DIAGNOSIS — I272 Other secondary pulmonary hypertension: Secondary | ICD-10-CM | POA: Insufficient documentation

## 2015-12-16 DIAGNOSIS — Z86711 Personal history of pulmonary embolism: Secondary | ICD-10-CM | POA: Diagnosis not present

## 2015-12-16 DIAGNOSIS — Z8249 Family history of ischemic heart disease and other diseases of the circulatory system: Secondary | ICD-10-CM | POA: Diagnosis not present

## 2015-12-16 DIAGNOSIS — Z79899 Other long term (current) drug therapy: Secondary | ICD-10-CM | POA: Insufficient documentation

## 2015-12-16 DIAGNOSIS — F1010 Alcohol abuse, uncomplicated: Secondary | ICD-10-CM | POA: Diagnosis not present

## 2015-12-16 DIAGNOSIS — I130 Hypertensive heart and chronic kidney disease with heart failure and stage 1 through stage 4 chronic kidney disease, or unspecified chronic kidney disease: Secondary | ICD-10-CM | POA: Insufficient documentation

## 2015-12-16 DIAGNOSIS — I5022 Chronic systolic (congestive) heart failure: Secondary | ICD-10-CM | POA: Diagnosis not present

## 2015-12-16 DIAGNOSIS — I252 Old myocardial infarction: Secondary | ICD-10-CM | POA: Diagnosis not present

## 2015-12-16 LAB — BASIC METABOLIC PANEL
ANION GAP: 16 — AB (ref 5–15)
BUN: 96 mg/dL — ABNORMAL HIGH (ref 6–20)
CALCIUM: 10 mg/dL (ref 8.9–10.3)
CO2: 30 mmol/L (ref 22–32)
Chloride: 83 mmol/L — ABNORMAL LOW (ref 101–111)
Creatinine, Ser: 2.04 mg/dL — ABNORMAL HIGH (ref 0.61–1.24)
GFR calc Af Amer: 37 mL/min — ABNORMAL LOW (ref >60)
GFR, EST NON AFRICAN AMERICAN: 32 mL/min — AB (ref >60)
GLUCOSE: 205 mg/dL — AB (ref 65–99)
Potassium: 2.9 mmol/L — ABNORMAL LOW (ref 3.5–5.1)
SODIUM: 129 mmol/L — AB (ref 135–145)

## 2015-12-16 LAB — LIPID PANEL
CHOLESTEROL: 262 mg/dL — AB (ref 0–200)
HDL: 51 mg/dL (ref >40)
LDL Cholesterol: 195 mg/dL — ABNORMAL HIGH (ref 0–99)
Total CHOL/HDL Ratio: 5.1 RATIO
Triglycerides: 80 mg/dL (ref <150)
VLDL: 16 mg/dL (ref 0–40)

## 2015-12-16 LAB — BRAIN NATRIURETIC PEPTIDE: B NATRIURETIC PEPTIDE 5: 454.4 pg/mL — AB (ref 0.0–100.0)

## 2015-12-16 MED ORDER — CARVEDILOL 3.125 MG PO TABS: 3.1250 mg | ORAL_TABLET | Freq: Two times a day (BID) | ORAL | Status: DC

## 2015-12-16 NOTE — Patient Instructions (Signed)
Start Coreg 3.125mg  (1 tablet) twice daily.  Routine lab work today. Will notify you of abnormal results  Follow up with Dr.Bensimhon in 2 weeks

## 2015-12-18 NOTE — Progress Notes (Signed)
Patient ID: Rick [name], male   DOB: 05-25-1946, 70 y.o.   MRN: [id number]    Advanced Heart Failure Clinic Note   Referring Physician: Dr Harl Bowie Primary Care: Wende Neighbors, MD Primary Cardiologist: Dr Harl Bowie  HPI: Rick [name] is a 70 y.o. male with history of ischemic cardiomyopathy status post CABG in 2003 as well as previous stenting. Most recent 2-D echo in 2016 EF 25% with restrictive diastolic dysfunction, moderate RV dysfunction PAS P 64, status post ICD followed by Dr. Rayann Heman with normal check in 02/2015. In June 2017 had subdural hema  Pt admitted 5/4 - 10/13/15 with recurrent volume overload in setting of dietary non-compliance. Echo during that admission showed EF worsening to 15-20% from 25%.  Seen in Dr. Harl Bowie office 10/20/15. Mild volume overload with dietary indiscretion. Torsemide increased to 60/40 mg. Weight at that visit 159 lbs  Admitted 6/10 through 11/20/15 with subdural hematoma and heart failure. Had evacuation of subdural hematoma. Required short term inotropes.  He was discharged off bb and ace. Discharge weight was 147 pounds.   He presents today for follow up. He has been having trouble with hypotension.  Last week, he was given 80 mg IV lasix due to 20 pound weight gain. Also, Bidil, Coreg, lisinopril, and spironolactone were stopped due to hypotension. Currently he is only taking torsemide and warfarin.  Weight is down another 6 lbs.  He says that he is breathing ok when walking on flat ground.  Overall feels better.  No orthopnea.    Corevue was reviewed, no evidence for volume overload.   Echo 10/08/15 LVEF 15-20%, Severe LV dilation, Severe hypokinesis with areas of akinesis, Trivial AI, Mild MR, Mod LAE, Severe RV dilation, Severe TR, PA peak pressure 36 mm Hg Echo 03/04/15 LVEF 25%,  Severe hypokinesis, Trivial AI, Mild MR, RV mod dilated and mod reduced, Severe RAE, Mod TR, PA peak pressure 64 mm Hg.   Labs (7/17): K 4.1, creatinine 1.76, BNP 654   Past  Medical History  Diagnosis Date  . Essential hypertension   . Ischemic cardiomyopathy     EF 20% by echo 2013  . Other pulmonary embolism and infarction     On coumadin  . Hyperlipidemia   . DOE (dyspnea on exertion)   . CAD (coronary artery disease)   . Anxiety   . Tricuspid regurgitation     Severe  . Pulmonary hypertension (HCC)     56 mm Hg  . AICD (automatic cardioverter/defibrillator) present   . Family history of adverse reaction to anesthesia     "sister would go into seizures"  . CHF (congestive heart failure) ([place])   . Myocardial infarction acute ([place]) 2003  . Sleep apnea     "tried machine a couple nights; couldn't do it" (11/03/2015)  . 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 Stroke [facility])     left side facial droop since; don't know when it was" (11/03/2015)    Current Outpatient Prescriptions  Medication Sig Dispense Refill  . magnesium oxide (MAG-OX) 400 MG tablet Take 400 mg by mouth daily.    . metolazone (ZAROXOLYN) 5 MG tablet Take 0.5 tablets (2.5 mg total) by mouth as needed. For weight gain of 3 lbs overnight or 5 lbs within one week. 6 tablet 3  . oxyCODONE (OXY IR/ROXICODONE) 5 MG immediate release tablet Take 1 tablet (5 mg total) by mouth every 4 (four) hours as needed for moderate pain. 50 tablet 0  . polyethylene glycol (MIRALAX /  GLYCOLAX) packet Take 17 g by mouth daily as needed for mild constipation. Reported on 12/09/2015    . potassium chloride SA (K-DUR,KLOR-CON) 20 MEQ tablet Take 20 mEq by mouth as needed (hand cramping).    . torsemide (DEMADEX) 20 MG tablet Take 3 tablets (60 mg total) by mouth 2 (two) times daily. 180 tablet 6  . warfarin (COUMADIN) 3 MG tablet Take 3 mg by mouth daily.    . carvedilol (COREG) 3.125 MG tablet Take 1 tablet (3.125 mg total) by mouth 2 (two) times daily with a meal. 60 tablet 3  . ferrous sulfate 325 (65 FE) MG tablet Take 325 mg by mouth daily with breakfast. Reported on 12/16/2015     No current facility-administered  medications for this encounter.    No Known Allergies    Social History   Social History  . Marital Status: Married    Spouse Name: N/A  . Number of Children: 4  . Years of Education: N/A   Occupational History  . Disabled.    Social History Main Topics  . Smoking status: Former Smoker -- 0.50 packs/day for 38 years    Types: Cigarettes    Start date: 12/30/1956    Quit date: 06/06/1995  . Smokeless tobacco: Never Used  . Alcohol Use: 1.8 oz/week    3 Cans of beer, 0 Standard drinks or equivalent per week     Comment: 11/03/2015 "Quit from [id number]; weaning myself off again"  . Drug Use: No  . Sexual Activity: Not Currently   Other Topics Concern  . Not on file   Social History Narrative   Lives with wife and brother in [place] and grandson in [place].        Family History  Problem Relation Age of Onset  . Heart disease Mother     CAD  . Leukemia Father     Filed Vitals:   12/16/15 0905  BP: 120/80  Pulse: 79  Height: 5\' 11"  (1.803 m)  Weight: 161 lb (73.029 kg)  SpO2: 99%   Wt Readings from Last 3 Encounters:  12/16/15 161 lb (73.029 kg)  12/10/15 167 lb (75.751 kg)  12/09/15 170 lb 4 oz (77.225 kg)     PHYSICAL EXAM: General:  Elderly appearing. NAD. Wife present.   HEENT: normal Neck: supple. JVP 7. Carotids 2+ bilat; no bruits. No lymphadenopathy or thyromegaly appreciated. Cor: PMI nondisplaced. RRR. No rubs. 2/6 MR.  No S3. Lungs: Decreased in the bases.   Abdomen: soft, NT, ND, no HSM. No bruits or masses. +BS  Extremities: no cyanosis, clubbing, rash. No edema.   Neuro: alert & oriented x 3, cranial nerves grossly intact. moves all 4 extremities w/o difficulty. Affect pleasant.   ASSESSMENT & PLAN: 1. Chronic systolic CHF: Echo [id number] LVEF 15-20% with ischemic cardiomyopathy.  Now NYHA class II-III.  Volume status looks good.  He is no longer hypotensive but is off most cardiac meds.  - Continue torsemide to 60 mg twice a day with  metolazone available as needed. - Restart Coreg 3.125 mg bid.  - Hold off on spironolactone and lisinopril for now with elevated creatinine, will repeat BMET today to reassess. Next step may be restarting low dose Bidil.  2. CAD s/p CABG: No CP. On aspirin and statin.  3. ETOH abuse: has not had any alcohol since discharge. Needs to stay off alcohol.  4. Hx of CVA 5. Subdural hematoma: S/P evacuation hematoma on 11/15/15. Has follow up with Dr  Jenkins.  6. AKI on CKD: Creatinine most recently 1.76, repeat today.  7. OSA: Intolerant CPAP.   Followup in 2 weeks to continue med titration.   Loralie Champagne  12/18/2015

## 2015-12-20 ENCOUNTER — Encounter (HOSPITAL_COMMUNITY): Payer: Self-pay | Admitting: *Deleted

## 2015-12-20 ENCOUNTER — Telehealth (HOSPITAL_COMMUNITY): Payer: Self-pay | Admitting: *Deleted

## 2015-12-20 NOTE — Telephone Encounter (Signed)
Notes Recorded by Harvie Junior, CMA on 12/20/2015 at 3:04 PM Attempted to reach patient again no answer. Mailed letter. Notes Recorded by Harvie Junior, [place] on 12/17/2015 at 11:37 AM Left vm for pt to call back for lab results Notes Recorded by Larey Dresser, MD on 12/17/2015 at 12:34 AM Hold torsemide x 2 days then decrease to 60 mg daily. Take KCl 40 mEq daily. Add atorvastatin 40 mg daily with lipids/LFTs in 2 months. Needs BMET early next week.

## 2015-12-21 ENCOUNTER — Encounter: Payer: Self-pay | Admitting: Internal Medicine

## 2016-01-05 ENCOUNTER — Encounter (HOSPITAL_COMMUNITY): Payer: Medicare Other

## 2016-01-18 ENCOUNTER — Encounter (HOSPITAL_COMMUNITY): Payer: Self-pay

## 2016-01-18 ENCOUNTER — Ambulatory Visit (HOSPITAL_COMMUNITY)
Admission: RE | Admit: 2016-01-18 | Discharge: 2016-01-18 | Disposition: A | Discharge status: Home / Self Care | Payer: Medicare Other | Source: Ambulatory Visit | Attending: Cardiology | Admitting: Cardiology

## 2016-01-18 VITALS — BP 92/60 | HR 70 | Wt 179.0 lb

## 2016-01-18 DIAGNOSIS — Z87891 Personal history of nicotine dependence: Secondary | ICD-10-CM | POA: Diagnosis not present

## 2016-01-18 DIAGNOSIS — I130 Hypertensive heart and chronic kidney disease with heart failure and stage 1 through stage 4 chronic kidney disease, or unspecified chronic kidney disease: Secondary | ICD-10-CM | POA: Insufficient documentation

## 2016-01-18 DIAGNOSIS — N183 Chronic kidney disease, stage 3 unspecified: Secondary | ICD-10-CM

## 2016-01-18 DIAGNOSIS — Z95810 Presence of automatic (implantable) cardiac defibrillator: Secondary | ICD-10-CM | POA: Diagnosis not present

## 2016-01-18 DIAGNOSIS — Z951 Presence of aortocoronary bypass graft: Secondary | ICD-10-CM | POA: Insufficient documentation

## 2016-01-18 DIAGNOSIS — Z7982 Long term (current) use of aspirin: Secondary | ICD-10-CM | POA: Insufficient documentation

## 2016-01-18 DIAGNOSIS — I5042 Chronic combined systolic (congestive) and diastolic (congestive) heart failure: Secondary | ICD-10-CM

## 2016-01-18 DIAGNOSIS — I2510 Atherosclerotic heart disease of native coronary artery without angina pectoris: Secondary | ICD-10-CM | POA: Insufficient documentation

## 2016-01-18 DIAGNOSIS — Z8673 Personal history of transient ischemic attack (TIA), and cerebral infarction without residual deficits: Secondary | ICD-10-CM | POA: Insufficient documentation

## 2016-01-18 DIAGNOSIS — Z7901 Long term (current) use of anticoagulants: Secondary | ICD-10-CM | POA: Diagnosis not present

## 2016-01-18 DIAGNOSIS — Z79899 Other long term (current) drug therapy: Secondary | ICD-10-CM | POA: Insufficient documentation

## 2016-01-18 DIAGNOSIS — E785 Hyperlipidemia, unspecified: Secondary | ICD-10-CM | POA: Insufficient documentation

## 2016-01-18 DIAGNOSIS — Z8249 Family history of ischemic heart disease and other diseases of the circulatory system: Secondary | ICD-10-CM | POA: Diagnosis not present

## 2016-01-18 DIAGNOSIS — G4733 Obstructive sleep apnea (adult) (pediatric): Secondary | ICD-10-CM | POA: Diagnosis not present

## 2016-01-18 DIAGNOSIS — I5022 Chronic systolic (congestive) heart failure: Secondary | ICD-10-CM | POA: Diagnosis present

## 2016-01-18 DIAGNOSIS — Z86711 Personal history of pulmonary embolism: Secondary | ICD-10-CM | POA: Diagnosis not present

## 2016-01-18 DIAGNOSIS — I252 Old myocardial infarction: Secondary | ICD-10-CM | POA: Insufficient documentation

## 2016-01-18 DIAGNOSIS — I255 Ischemic cardiomyopathy: Secondary | ICD-10-CM | POA: Insufficient documentation

## 2016-01-18 DIAGNOSIS — F1010 Alcohol abuse, uncomplicated: Secondary | ICD-10-CM | POA: Insufficient documentation

## 2016-01-18 LAB — BASIC METABOLIC PANEL
ANION GAP: 10 (ref 5–15)
BUN: 29 mg/dL — AB (ref 6–20)
CALCIUM: 9.1 mg/dL (ref 8.9–10.3)
CO2: 22 mmol/L (ref 22–32)
Chloride: 104 mmol/L (ref 101–111)
Creatinine, Ser: 1.65 mg/dL — ABNORMAL HIGH (ref 0.61–1.24)
GFR calc Af Amer: 47 mL/min — ABNORMAL LOW (ref >60)
GFR calc non Af Amer: 40 mL/min — ABNORMAL LOW (ref >60)
GLUCOSE: 114 mg/dL — AB (ref 65–99)
POTASSIUM: 3.2 mmol/L — AB (ref 3.5–5.1)
Sodium: 136 mmol/L (ref 135–145)

## 2016-01-18 LAB — BRAIN NATRIURETIC PEPTIDE: B Natriuretic Peptide: 1572 pg/mL — ABNORMAL HIGH (ref 0.0–100.0)

## 2016-01-18 NOTE — Progress Notes (Signed)
Patient ID: Rick [name], male   DOB: May 20, 1946, 70 y.o.   MRN: [id number]    Advanced Heart Failure Clinic Note   Referring Physician: Dr Harl Bowie Primary Care: Wende Neighbors, MD Primary Cardiologist: Dr Harl Bowie  HPI: Rick [name] is a 70 y.o. male with history of ischemic cardiomyopathy status post CABG in 2003 as well as previous stenting. Most recent 2-D echo in 2016 EF 25% with restrictive diastolic dysfunction, moderate RV dysfunction PAS P 64, status post ICD followed by Dr. Rayann Heman with normal check in 02/2015. In June 2017 had subdural hema  Pt admitted 5/4 - 10/13/15 with recurrent volume overload in setting of dietary non-compliance. Echo during that admission showed EF worsening to 15-20% from 25%.  Seen in Dr. Harl Bowie office 10/20/15. Mild volume overload with dietary indiscretion. Torsemide increased to 60/40 mg. Weight at that visit 159 lbs  Admitted 6/10 through 11/20/15 with subdural hematoma and heart failure. Had evacuation of subdural hematoma. Required short term inotropes.  He was discharged off bb and ace. Discharge weight was 147 pounds.   Today he returns for HF follow up. Last visit carvedilol was restarted. Complaining of fatigue. Mild dyspnea walking. Ambulates with cane. Mild dyspnea when he gets dressed. Not moving around much at home. Appetite ok. Drinking extra fluid + watermelon. Weight at home 174-178 pounds. No bleeding problems. Has not had alcohol or smoking.  His wife drives him to appointments.   Corevue was reviewed, impedance low.     Echo 10/08/15 LVEF 15-20%, Severe LV dilation, Severe hypokinesis with areas of akinesis, Trivial AI, Mild MR, Mod LAE, Severe RV dilation, Severe TR, PA peak pressure 36 mm Hg Echo 03/04/15 LVEF 25%,  Severe hypokinesis, Trivial AI, Mild MR, RV mod dilated and mod reduced, Severe RAE, Mod TR, PA peak pressure 64 mm Hg.   Labs (7/17): K 4.1, creatinine 1.76, BNP 654 Labs 12/16/2015: K 2.9 Creatinine 2.04    Past Medical History:    Diagnosis Date  . AICD (automatic cardioverter/defibrillator) present   . Anxiety   . CAD (coronary artery disease)   . CHF (congestive heart failure) ([place])   . DOE (dyspnea on exertion)   . Essential hypertension   . Family history of adverse reaction to anesthesia    "sister would go into seizures"  . Hyperlipidemia   . Ischemic cardiomyopathy    EF 20% by echo 2013  . Myocardial infarction acute ([place]) 2003  . 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 Other pulmonary embolism and infarction    On coumadin  . Pulmonary hypertension (HCC)    56 mm Hg  . Sleep apnea    "tried machine a couple nights; couldn't do it" (11/03/2015)  . Stroke [facility])    left side facial droop since; don't know when it was" (11/03/2015)  . Tricuspid regurgitation    Severe    Current Outpatient Prescriptions  Medication Sig Dispense Refill  . carvedilol (COREG) 3.125 MG tablet Take 1 tablet (3.125 mg total) by mouth 2 (two) times daily with a meal. 60 tablet 3  . ferrous sulfate 325 (65 FE) MG tablet Take 325 mg by mouth daily with breakfast. Reported on 12/16/2015    . magnesium oxide (MAG-OX) 400 MG tablet Take 400 mg by mouth daily.    [name] torsemide (DEMADEX) 20 MG tablet Take 3 tablets (60 mg total) by mouth 2 (two) times daily. 180 tablet 6  . warfarin (COUMADIN) 3 MG tablet Take 3 mg by mouth daily.    [name]  metolazone (ZAROXOLYN) 5 MG tablet Take 0.5 tablets (2.5 mg total) by mouth as needed. For weight gain of 3 lbs overnight or 5 lbs within one week. (Patient not taking: Reported on 01/18/2016) 6 tablet 3  . oxyCODONE (OXY IR/ROXICODONE) 5 MG immediate release tablet Take 1 tablet (5 mg total) by mouth every 4 (four) hours as needed for moderate pain. (Patient not taking: Reported on 01/18/2016) 50 tablet 0  . polyethylene glycol (MIRALAX / GLYCOLAX) packet Take 17 g by mouth daily as needed for mild constipation. Reported on 12/09/2015    . potassium chloride SA (K-DUR,KLOR-CON) 20 MEQ tablet Take 20 mEq by mouth  as needed (hand cramping).     No current facility-administered medications for this encounter.     No Known Allergies    Social History   Social History  . Marital status: Married    Spouse name: N/A  . Number of children: 4  . Years of education: N/A   Occupational History  . Disabled.    Social History Main Topics  . Smoking status: Former Smoker    Packs/day: 0.50    Years: 38.00    Types: Cigarettes    Start date: 12/30/1956    Quit date: 06/06/1995  . Smokeless tobacco: Never Used  . Alcohol use 1.8 oz/week    3 Cans of beer per week     Comment: 11/03/2015 "Quit from [id number]; weaning myself off again"  . Drug use: No  . Sexual activity: Not Currently   Other Topics Concern  . Not on file   Social History Narrative   Lives with wife and brother in [place] and grandson in [place].        Family History  Problem Relation Age of Onset  . Heart disease Mother     CAD  . Leukemia Father     There were no vitals filed for this visit. Wt Readings from Last 3 Encounters:  12/16/15 161 lb (73 kg)  12/10/15 167 lb (75.8 kg)  12/09/15 170 lb 4 oz (77.2 kg)     PHYSICAL EXAM: General:  Elderly appearing. NAD. Wife present.   HEENT: normal Neck: supple. JVP to jaw . Carotids 2+ bilat; no bruits. No lymphadenopathy or thyromegaly appreciated. Cor: PMI nondisplaced. RRR. No rubs. 2/6 MR.  No S3. Lungs: Decreased in the bases.   Abdomen: soft, NT, ND, no HSM. No bruits or masses. +BS  Extremities: no cyanosis, clubbing, rash.  R and LLE 1+ edeamNo edema.   Neuro: alert & oriented x 3, cranial nerves grossly intact. moves all 4 extremities w/o difficulty. Affect pleasant.   ASSESSMENT & PLAN: 1. Chronic systolic CHF: Has ST Jude ICD with CorvueEcho 10/08/15 LVEF 15-20% with ischemic cardiomyopathy.  Now NYHA class III.  Volume status elevated likely related to increased fluid intake.  . Continue torsemide to 60 mg twice a day. Today he will take 2.5 mg  metolazone today and tomorrow + 40 meq of K.    - Stop coreg due to hypotension.  - Hold off on spironolactone and lisinopril for now with elevated creatinine. BMET and BNP today., ill repeat BMET today to reassess.  2. CAD s/p CABG: No CP. On aspirin and statin. No CP.  3. ETOH abuse: has not had any alcohol since discharge. Needs to stay off alcohol.  4. Hx of CVA- on coumadin.  5. Subdural hematoma: S/P evacuation hematoma on 11/15/15. 6. CKD Stage III: Creatinine most recently 1.76. Check BMET today.  7. OSA: Intolerant CPAP.   BMET today. K 3.2 Creatinine 1.6 BNP 1572    Follow up in 1 week to reassesss volume status and check BMET/BNP.  May need RHC if no improvement. Once volume status improved he will need CPX to determine if limitations are related to HF. May need to discuss advanced therapies.   Artrell Lawless NP-C  01/18/2016

## 2016-01-18 NOTE — Patient Instructions (Signed)
STOP Carvedilol (Coreg).  Take Metolazone 2.5 mg and Potassium 40 meq once today and tomorrow.  Routine lab work today. Will notify you of abnormal results, otherwise no news is good news!  Follow up next week.  Do the following things EVERYDAY: 1) Weigh yourself in the morning before breakfast. Write it down and keep it in a log. 2) Take your medicines as prescribed 3) Eat low salt foods-Limit salt (sodium) to 2000 mg per day.  4) Stay as active as you can everyday 5) Limit all fluids for the day to less than 2 liters

## 2016-01-18 NOTE — Progress Notes (Signed)
Advanced Heart Failure Medication Review by a Pharmacist  Does the patient  feel that his/her medications are working for him/her?  yes  Has the patient been experiencing any side effects to the medications prescribed?  no  Does the patient measure his/her own blood pressure or blood glucose at home?  no   Does the patient have any problems obtaining medications due to transportation or finances?   no  Understanding of regimen: good Understanding of indications: good Potential of compliance: good Patient understands to avoid NSAIDs. Patient understands to avoid decongestants.  Issues to address at subsequent visits: None   Pharmacist comments:  Mr. Seyller is a pleasant 70 yo M presenting with his wife and without a medication list. He reports good compliance with his regimen but does state that he doesn't think his torsemide is working as well as it used to. He did not have any other medication-related questions or concerns for me at this time.   [name]. Velva Harman, PharmD, BCPS, CPP Clinical Pharmacist Pager: [phone] Phone: [phone] 01/18/2016 11:32 AM      Time with patient: 10 minutes Preparation and documentation time: 2 minutes Total time: 12 minutes

## 2016-01-19 ENCOUNTER — Telehealth (HOSPITAL_COMMUNITY): Payer: Self-pay | Admitting: Cardiology

## 2016-01-19 MED ORDER — POTASSIUM CHLORIDE CRYS ER 20 MEQ PO TBCR: 40.0000 meq | EXTENDED_RELEASE_TABLET | Freq: Every day | ORAL | 3 refills | Status: DC

## 2016-01-19 NOTE — Telephone Encounter (Signed)
-----   Message from Conrad Loveland, NP sent at 01/18/2016  2:01 PM EDT ----- Increased diuretics today. BNP elevated. Please call him and ask him to continue 40 meq potassium daily.

## 2016-01-24 ENCOUNTER — Encounter (INDEPENDENT_AMBULATORY_CARE_PROVIDER_SITE_OTHER): Payer: Self-pay | Admitting: Internal Medicine

## 2016-01-24 ENCOUNTER — Ambulatory Visit (INDEPENDENT_AMBULATORY_CARE_PROVIDER_SITE_OTHER): Payer: Medicare Other | Admitting: Internal Medicine

## 2016-01-24 VITALS — BP 102/72 | HR 64 | Temp 97.3°F | Ht 71.0 in | Wt 180.3 lb

## 2016-01-24 DIAGNOSIS — R188 Other ascites: Secondary | ICD-10-CM

## 2016-01-24 DIAGNOSIS — I255 Ischemic cardiomyopathy: Secondary | ICD-10-CM | POA: Diagnosis not present

## 2016-01-24 DIAGNOSIS — K831 Obstruction of bile duct: Secondary | ICD-10-CM

## 2016-01-24 NOTE — Patient Instructions (Signed)
OV in 6 months. CBC, CMET in 3 months.

## 2016-01-24 NOTE — Progress Notes (Signed)
Subjective:    Patient ID: Rick [name], male    DOB: 03/15/1946, 70 y.o.   MRN: [id number]  HPI Here today for f/u. He was last seen in February of this year. 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 Hx of anemia.  Referred to Dr Barry Dienes in February for possible gallbladder mass. F/u CT did not reveal a mass.  Surgery was placed on hold.  He tells me he has gout in his feet.  He tells me his breathing is okay unless he strains himself.  Appetite is good. He has lost from 187 to 180.3.  He is having a BM daily or every other day and takes a laxative.    10/2015 EF 14-20%      [place] RUQ 07/30/2015  Stable mild prominence of the left hepatic lobe and caudate lobe. No focal hepatic abnormality identified. IVC and hepatic veins are prominent. This suggests possibility of congestive heart failure.  IMPRESSION: 1. Gallbladder wall is thickened measuring up to 14 mm. The gallbladder wall is particularly thickened anteriorly. Focal gallbladder wall mass cannot be excluded. No gallstones. No biliary distention. Negative Murphy sign. Questionable small amount of pericholecystic fluid. No biliary distention.  2. Distended IVC and hepatic veins suggesting congestive heart failure.   US abdomen 03/12/2012:  Mild amt of ascites seen in the rt upper quadrant. Markedly thickened gallbladder wall without evidence of gallstone and by report a negative sonographic Murphy's sign. GB wall measure 8.56m in thickness. This could be factitious wall thickening due to hypoproteinemia or other underlying medical conditions, though chronic cholecystitis cannot be excluded if suspected clinically. Echogenic liver suggesting fatty infiltration or other form of intrinsic liver disease. No focal liver lesion or bile duct dilatation is noted. Mildly dilated and ectatic pancreatic duct, but otherwise sonographically unremarkable  appearing pancreas. Increased echogenicity of renal parenchyma suggesting underlying medical renal disease.  05/2012 Ct abdomen/pelvis with CM: 1. Moderate to large amount of ascites, diffuse body wall edema,  and tiny right pleural effusion.  2. Cardiomegaly and signs of right heart insufficiency.  3. Hepatic cirrhosis. No evidence of neoplasm or splenomegaly      He underwent EGD and colonoscopy on 06/06/2012. EGD revealed mild portal gastropathy but no evidence of varices. Colonoscopy revealed external hemorrhoids and small rectal lipoma  Hepatic Function Panel     Component Value Date/Time   PROT 7.4 11/13/2015 2240   ALBUMIN 3.0 (L) 11/13/2015 2240   AST 22 11/13/2015 2240   ALT 13 (L) 11/13/2015 2240   ALKPHOS 107 11/13/2015 2240   BILITOT 2.2 (H) 11/13/2015 2240   BILIDIR 0.2 06/16/2013 1032   IBILI 0.7 06/16/2013 1032       CBC Latest Ref Rng & Units 12/09/2015 11/16/2015 11/15/2015  WBC 4.0 - 10.5 K/uL 5.7 6.4 5.7  Hemoglobin 13.0 - 17.0 g/dL 9.8(L) 11.0(L) 10.7(L)  Hematocrit 39.0 - 52.0 % 30.8(L) 35.8(L) 34.5(L)  Platelets 150 - 400 K/uL 153 204 217   Hepatic Function Latest Ref Rng & Units 11/13/2015 10/07/2015 06/16/2013  Total Protein 6.5 - 8.1 g/dL 7.4 8.0 7.6  Albumin 3.5 - 5.0 g/dL 3.0(L) 2.8(L) 3.9  AST 15 - 41 U/L 22 33 16  ALT 17 - 63 U/L 13(L) 15(L) 12  Alk Phosphatase 38 - 126 U/L 107 97 67  Total Bilirubin 0.3 - 1.2 mg/dL 2.2(H) 4.8(H) 0.9  Bilirubin, Direct 0.0 - 0.3 mg/dL - - 0.2  Review of Systems Past Medical History:  Diagnosis Date  . AICD (automatic cardioverter/defibrillator) present   . Anxiety   . CAD (coronary artery disease)   . CHF (congestive heart failure) ([place])   . DOE (dyspnea on exertion)   . Essential hypertension   . Family history of adverse reaction to anesthesia    "sister would go into seizures"  . Hyperlipidemia   . Ischemic cardiomyopathy    EF 20% by echo 2013  . Myocardial infarction acute ([place]) 2003    . 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 Other pulmonary embolism and infarction    On coumadin  . Pulmonary hypertension (HCC)    56 mm Hg  . Sleep apnea    "tried machine a couple nights; couldn't do it" (11/03/2015)  . Stroke [facility])    left side facial droop since; don't know when it was" (11/03/2015)  . Tricuspid regurgitation    Severe    Past Surgical History:  Procedure Laterality Date  . BURR HOLE Left 11/15/2015   Procedure: Left side burr holes;  Surgeon: Ashok Pall, MD;  Location: [facility] NEURO ORS;  Service: Neurosurgery;  Laterality: Left;  . CARDIAC CATHETERIZATION N/A 11/11/2015   Procedure: Right/Left Heart Cath and Coronary Angiography;  Surgeon: Jolaine Artist, MD;  Location: [facility] CV LAB;  Service: Cardiovascular;  Laterality: N/A;  . CARDIAC DEFIBRILLATOR PLACEMENT  11/09/06   SJM Epic II DR ICD implanted in [place], atrial lead no longer functions due to low impedance  . COLONOSCOPY WITH ESOPHAGOGASTRODUODENOSCOPY (EGD)  06/06/2012   Procedure: COLONOSCOPY WITH ESOPHAGOGASTRODUODENOSCOPY (EGD);  Surgeon: Rogene Houston, MD;  Location[facility];  Service: Endoscopy;  Laterality: N/A;  310  . CORONARY ANGIOPLASTY WITH STENT PLACEMENT  "<2003  . CORONARY ARTERY BYPASS GRAFT  2003   4 vessel  . IMPLANTABLE CARDIOVERTER DEFIBRILLATOR (ICD) GENERATOR CHANGE N/A 05/27/2012   Procedure: ICD GENERATOR CHANGE;  Surgeon: Thompson Grayer, MD;  Location: [facility] CATH LAB;  Service: Cardiovascular;  Laterality: N/A;  . IMPLANTABLE CARDIOVERTER DEFIBRILLATOR GENERATOR CHANGE  05/27/2012   Gen change.  SJM Assura VR.  Riata V lead    No Known Allergies  Current Outpatient Prescriptions on File Prior to Visit  Medication Sig Dispense Refill  . ferrous sulfate 325 (65 FE) MG tablet Take 325 mg by mouth daily with breakfast. Reported on 12/16/2015    . magnesium oxide (MAG-OX) 400 MG tablet Take 400 mg by mouth daily.    . polyethylene glycol (MIRALAX / GLYCOLAX) packet Take 17 g  by mouth daily as needed for mild constipation. Reported on 12/09/2015    . potassium chloride SA (K-DUR,KLOR-CON) 20 MEQ tablet Take 2 tablets (40 mEq total) by mouth daily. 60 tablet 3  . torsemide (DEMADEX) 20 MG tablet Take 3 tablets (60 mg total) by mouth 2 (two) times daily. 180 tablet 6  . warfarin (COUMADIN) 3 MG tablet Take 3 mg by mouth daily.    [name] oxyCODONE (OXY IR/ROXICODONE) 5 MG immediate release tablet Take 1 tablet (5 mg total) by mouth every 4 (four) hours as needed for moderate pain. (Patient not taking: Reported on 01/24/2016) 50 tablet 0   No current facility-administered medications on file prior to visit.        Objective:   Physical Exam  Blood pressure 102/72, pulse 64, temperature 97.3 F (36.3 C), height 5' 11"  (1.803 m), weight 180 lb 4.8 oz (81.8 kg).  Alert and oriented. Skin warm and dry. Oral mucosa is  moist.   . Sclera anicteric, conjunctivae is pink. Thyroid not enlarged. No cervical lymphadenopathy. Lungs clear. Heart regular rate and rhythm. Loud murmur heard Abdomen is soft. Bowel sounds are positive. No hepatomegaly. No abdominal masses felt. No tenderness.  Abdomen is soft.  1+ edema to lower extremities.        Assessment & Plan:  Hx of ascites, cholestasis. Congestive hepatopathy due to underlying ischemic cardiomyopathy,.  OV in 6 months. CBC and CMET in 3 months.

## 2016-01-27 ENCOUNTER — Telehealth (HOSPITAL_COMMUNITY): Payer: Self-pay | Admitting: *Deleted

## 2016-01-27 MED ORDER — POTASSIUM CHLORIDE CRYS ER 20 MEQ PO TBCR: 40.0000 meq | EXTENDED_RELEASE_TABLET | Freq: Every day | ORAL | 3 refills | Status: DC

## 2016-01-27 MED ORDER — MAGNESIUM OXIDE 400 MG PO TABS: 400.0000 mg | ORAL_TABLET | Freq: Every day | ORAL | 3 refills | Status: DC

## 2016-01-27 MED ORDER — METOLAZONE 2.5 MG PO TABS: 2.5000 mg | ORAL_TABLET | ORAL | 3 refills | Status: DC

## 2016-01-27 NOTE — Telephone Encounter (Signed)
Pt called to report his wt is up to 177 lbs, was running around 170 lbs.  He also c/o edema from knees down and SOB with activity.  He states he has been taking his torsemide 60 mg BID, he was ordered metolazone 2.5 mg at last OV but states he never got the is rx so he never took.  Will send metolazone, he will get it and take tomorrow AM, he is sch for f/u on Tue 8/29

## 2016-02-01 ENCOUNTER — Encounter (HOSPITAL_COMMUNITY): Payer: Self-pay

## 2016-02-01 ENCOUNTER — Ambulatory Visit (HOSPITAL_BASED_OUTPATIENT_CLINIC_OR_DEPARTMENT_OTHER)
Admission: RE | Admit: 2016-02-01 | Discharge: 2016-02-01 | Disposition: A | Discharge status: Home / Self Care | Payer: Medicare Other | Source: Ambulatory Visit | Attending: Cardiology | Admitting: Cardiology

## 2016-02-01 VITALS — BP 100/66 | HR 62 | Wt 184.5 lb

## 2016-02-01 DIAGNOSIS — Z86711 Personal history of pulmonary embolism: Secondary | ICD-10-CM

## 2016-02-01 DIAGNOSIS — Z955 Presence of coronary angioplasty implant and graft: Secondary | ICD-10-CM

## 2016-02-01 DIAGNOSIS — I255 Ischemic cardiomyopathy: Secondary | ICD-10-CM | POA: Diagnosis not present

## 2016-02-01 DIAGNOSIS — N183 Chronic kidney disease, stage 3 unspecified: Secondary | ICD-10-CM

## 2016-02-01 DIAGNOSIS — Z95810 Presence of automatic (implantable) cardiac defibrillator: Secondary | ICD-10-CM | POA: Insufficient documentation

## 2016-02-01 DIAGNOSIS — E785 Hyperlipidemia, unspecified: Secondary | ICD-10-CM | POA: Insufficient documentation

## 2016-02-01 DIAGNOSIS — Z87891 Personal history of nicotine dependence: Secondary | ICD-10-CM

## 2016-02-01 DIAGNOSIS — Z7901 Long term (current) use of anticoagulants: Secondary | ICD-10-CM

## 2016-02-01 DIAGNOSIS — Z79899 Other long term (current) drug therapy: Secondary | ICD-10-CM | POA: Insufficient documentation

## 2016-02-01 DIAGNOSIS — G4733 Obstructive sleep apnea (adult) (pediatric): Secondary | ICD-10-CM | POA: Insufficient documentation

## 2016-02-01 DIAGNOSIS — K7460 Unspecified cirrhosis of liver: Secondary | ICD-10-CM

## 2016-02-01 DIAGNOSIS — Z951 Presence of aortocoronary bypass graft: Secondary | ICD-10-CM | POA: Insufficient documentation

## 2016-02-01 DIAGNOSIS — I272 Other secondary pulmonary hypertension: Secondary | ICD-10-CM

## 2016-02-01 DIAGNOSIS — I2510 Atherosclerotic heart disease of native coronary artery without angina pectoris: Secondary | ICD-10-CM | POA: Insufficient documentation

## 2016-02-01 DIAGNOSIS — I130 Hypertensive heart and chronic kidney disease with heart failure and stage 1 through stage 4 chronic kidney disease, or unspecified chronic kidney disease: Secondary | ICD-10-CM

## 2016-02-01 DIAGNOSIS — Z8249 Family history of ischemic heart disease and other diseases of the circulatory system: Secondary | ICD-10-CM | POA: Insufficient documentation

## 2016-02-01 DIAGNOSIS — Z7982 Long term (current) use of aspirin: Secondary | ICD-10-CM | POA: Insufficient documentation

## 2016-02-01 DIAGNOSIS — I252 Old myocardial infarction: Secondary | ICD-10-CM | POA: Insufficient documentation

## 2016-02-01 DIAGNOSIS — I5022 Chronic systolic (congestive) heart failure: Secondary | ICD-10-CM | POA: Diagnosis not present

## 2016-02-01 DIAGNOSIS — Z8673 Personal history of transient ischemic attack (TIA), and cerebral infarction without residual deficits: Secondary | ICD-10-CM

## 2016-02-01 LAB — BASIC METABOLIC PANEL
ANION GAP: 7 (ref 5–15)
BUN: 28 mg/dL — ABNORMAL HIGH (ref 6–20)
CALCIUM: 9.4 mg/dL (ref 8.9–10.3)
CHLORIDE: 103 mmol/L (ref 101–111)
CO2: 24 mmol/L (ref 22–32)
Creatinine, Ser: 1.66 mg/dL — ABNORMAL HIGH (ref 0.61–1.24)
GFR calc non Af Amer: 40 mL/min — ABNORMAL LOW (ref >60)
GFR, EST AFRICAN AMERICAN: 47 mL/min — AB (ref >60)
Glucose, Bld: 134 mg/dL — ABNORMAL HIGH (ref 65–99)
POTASSIUM: 3.9 mmol/L (ref 3.5–5.1)
Sodium: 134 mmol/L — ABNORMAL LOW (ref 135–145)

## 2016-02-01 LAB — BRAIN NATRIURETIC PEPTIDE: B Natriuretic Peptide: 1585.7 pg/mL — ABNORMAL HIGH (ref 0.0–100.0)

## 2016-02-01 MED ORDER — TORSEMIDE 20 MG PO TABS: 80.0000 mg | ORAL_TABLET | Freq: Two times a day (BID) | ORAL | 3 refills | Status: DC

## 2016-02-01 MED ORDER — POTASSIUM CHLORIDE CRYS ER 20 MEQ PO TBCR: 40.0000 meq | EXTENDED_RELEASE_TABLET | Freq: Two times a day (BID) | ORAL | 3 refills | Status: DC

## 2016-02-01 MED ORDER — METOLAZONE 2.5 MG PO TABS: 2.5000 mg | ORAL_TABLET | ORAL | 3 refills | Status: DC

## 2016-02-01 NOTE — Patient Instructions (Signed)
Increase Torsemide to 80 mg (4 tabs) Twice daily   Increase Potassium to 40 meq (2 tabs) Twice daily, take 1 extra tab on Wed and Sat with Metolazone  Start taking Metolazone 2.5 mg every Wednesday and Saturday  Labs tod;ay  Your physician recommends that you schedule a follow-up appointment in: 1 week

## 2016-02-01 NOTE — Progress Notes (Signed)
Patient ID: Rick [name], male   DOB: 09-23-1945, 70 y.o.   MRN: [id number]    Advanced Heart Failure Clinic Note   Referring Physician: Dr Harl Bowie Primary Care: Wende Neighbors, MD Primary Cardiologist: Dr Harl Bowie  HPI: Rick [name] is a 70 y.o. male with history of ischemic cardiomyopathy status post CABG in 2003 as well as previous stenting. Most recent 2-D echo in 2016 EF 25% with restrictive diastolic dysfunction, moderate RV dysfunction PAS P 64, status post ICD followed by Dr. Rayann Heman with normal check in 02/2015. In June 2017 had subdural hema  Pt admitted 5/4 - 10/13/15 with recurrent volume overload in setting of dietary non-compliance. Echo during that admission showed EF worsening to 15-20% from 25%.  Seen in Dr. Harl Bowie office 10/20/15. Mild volume overload with dietary indiscretion. Torsemide increased to 60/40 mg. Weight at that visit 159 lbs  Admitted 6/10 through 11/20/15 with subdural hematoma and heart failure. Had evacuation of subdural hematoma. Required short term inotropes.  He was discharged off bb and ace. Discharge weight was 147 pounds.   He presents today for regular follow up.  At last visit (8/15) metolazone added, though he did not pick up until 8/24. Weight up 5 lbs from last visit. He states he felt a little better after the metolazone. Has good and bad days of urine output. Weight at home up to 180 (up from ~ 174 last visit). SOB with shower or changing clothes.  DOE with walking short distances. Denies Orthopnea. Some bendopnea.  Denies lightheadedness or dizziness.  Drinking > 2L, still eating watermelon. Tries to watch salt, isn't always good.   Corevue shows thoracic impedence above threshold. No VT/VF alerts.   Echo 10/08/15 LVEF 15-20%, Severe LV dilation, Severe hypokinesis with areas of akinesis, Trivial AI, Mild MR, Mod LAE, Severe RV dilation, Severe TR, PA peak pressure 36 mm Hg Echo 03/04/15 LVEF 25%,  Severe hypokinesis, Trivial AI, Mild MR, RV mod dilated and  mod reduced, Severe RAE, Mod TR, PA peak pressure 64 mm Hg.   Labs (7/17): K 4.1, creatinine 1.76, BNP 654 Labs 12/16/2015: K 2.9 Creatinine 2.04  Labs (8/17): K 3.2, creatinine 1.65, BNP 1572   Past Medical History:  Diagnosis Date  . AICD (automatic cardioverter/defibrillator) present   . Anxiety   . CAD (coronary artery disease)   . CHF (congestive heart failure) ([place])   . DOE (dyspnea on exertion)   . Essential hypertension   . Family history of adverse reaction to anesthesia    "sister would go into seizures"  . Hyperlipidemia   . Ischemic cardiomyopathy    EF 20% by echo 2013  . Myocardial infarction acute ([place]) 2003  . 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 Other pulmonary embolism and infarction    On coumadin  . Pulmonary hypertension (HCC)    56 mm Hg  . Sleep apnea    "tried machine a couple nights; couldn't do it" (11/03/2015)  . Stroke [facility])    left side facial droop since; don't know when it was" (11/03/2015)  . Tricuspid regurgitation    Severe    Current Outpatient Prescriptions  Medication Sig Dispense Refill  . ferrous sulfate 325 (65 FE) MG tablet Take 325 mg by mouth daily with breakfast. Reported on 12/16/2015    . magnesium oxide (MAG-OX) 400 MG tablet Take 1 tablet (400 mg total) by mouth daily. 30 tablet 3  . metolazone (ZAROXOLYN) 2.5 MG tablet Take 2.5 mg by mouth as needed.    [name]  oxyCODONE (OXY IR/ROXICODONE) 5 MG immediate release tablet Take 1 tablet (5 mg total) by mouth every 4 (four) hours as needed for moderate pain. 50 tablet 0  . polyethylene glycol (MIRALAX / GLYCOLAX) packet Take 17 g by mouth daily as needed for mild constipation. Reported on 12/09/2015    . torsemide (DEMADEX) 20 MG tablet Take 3 tablets (60 mg total) by mouth 2 (two) times daily. 180 tablet 6  . warfarin (COUMADIN) 3 MG tablet Take 3 mg by mouth daily.    . potassium chloride SA (K-DUR,KLOR-CON) 20 MEQ tablet Take 2 tablets (40 mEq total) by mouth daily. 60 tablet 3   No  current facility-administered medications for this encounter.     No Known Allergies    Social History   Social History  . Marital status: Married    Spouse name: N/A  . Number of children: 4  . Years of education: N/A   Occupational History  . Disabled.    Social History Main Topics  . Smoking status: Former Smoker    Packs/day: 0.50    Years: 38.00    Types: Cigarettes    Start date: 12/30/1956    Quit date: 06/06/1995  . Smokeless tobacco: Never Used  . Alcohol use 1.8 oz/week    3 Cans of beer per week     Comment: 11/03/2015 "Quit from [id number]; weaning myself off again"  . Drug use: No  . Sexual activity: Not Currently   Other Topics Concern  . Not on file   Social History Narrative   Lives with wife and brother in [place] and grandson in [place].        Family History  Problem Relation Age of Onset  . Heart disease Mother     CAD  . Leukemia Father     Vitals:   02/01/16 1522  BP: 100/66  Pulse: 62  SpO2: 98%  Weight: 184 lb 8 oz (83.7 kg)   Wt Readings from Last 3 Encounters:  02/01/16 184 lb 8 oz (83.7 kg)  01/24/16 180 lb 4.8 oz (81.8 kg)  01/18/16 179 lb (81.2 kg)     PHYSICAL EXAM: General:  Elderly appearing. NAD. Wife present.   HEENT: normal Neck: supple. JVP 14+ cm with prominent CV wave.. Carotids 2+ bilat; no bruits. No thyromegaly or nodule noted.  Cor: PMI nondisplaced. RRR. No rubs. 3/6 MR.  No S3. Lungs: Clear.  Abdomen: soft, NT, ND, no HSM. No bruits or masses. +BS  Extremities: no cyanosis, clubbing, rash.  1+ edema to knees bilaterally.  Neuro: alert & oriented x 3, cranial nerves grossly intact. moves all 4 extremities w/o difficulty. Affect pleasant.   ASSESSMENT & PLAN: 1. Chronic biventricular CHF: Has St Jude ICD with Corevue, Echo 10/08/15 LVEF 15-20%, severe RV dilation and decreased function, severe TR.  Ischemic cardiomyopathy.  Now NYHA class III.   - Volume status remains elevated despite looking stable on  Corevue . Increase torsemide to 80 mg BID. Check BMET and BNP today  - Increase metolazone to 2.5 mg every Wednesday and Saturday. with extra 20 meq of K.    - Off coreg due to hypotension.  - Hold off on spironolactone and lisinopril for now with elevated creatinine. 2. CAD s/p CABG: No CP. On aspirin and statin. No CP.  3. ETOH abuse:  - Denies ETOH use.   4. Hx of CVA- on coumadin.  5. Subdural hematoma: S/P evacuation hematoma on 11/15/15. 6. CKD Stage III:  -  BMET today  7. OSA:  - Intolerant to CPAP.   Increase diuretics as above. Labs today and close follow up next week.  If no improvement will likely need to come in for IV diuresis and RHC.   Shirley Friar PA-C 02/01/2016  Patient seen with PA, agree with the above note.  He is markedly volume overloaded on exam, weight is up 23 lbs. He has severe RV failure.  He has not tolerated addition of BP-active meds.  Not on digoxin with renal dysfunction.   - BMET/BNP today.  - Increase torsemide to 80 mg bid. - Change metolazone to 2.5 mg on Wednesday and Saturday (biw).  - Increase KCl to 40 bid with extra 40 on metolazone days.  - Followup in 1 week with BMET => if not improved, will need to be admitted.   Loralie Champagne 02/02/2016

## 2016-02-02 ENCOUNTER — Encounter: Payer: Self-pay | Admitting: Internal Medicine

## 2016-02-02 ENCOUNTER — Ambulatory Visit (INDEPENDENT_AMBULATORY_CARE_PROVIDER_SITE_OTHER): Payer: Medicare Other | Admitting: Internal Medicine

## 2016-02-02 VITALS — BP 100/68 | HR 71 | Ht 71.0 in | Wt 186.0 lb

## 2016-02-02 DIAGNOSIS — Z95810 Presence of automatic (implantable) cardiac defibrillator: Secondary | ICD-10-CM

## 2016-02-02 DIAGNOSIS — I255 Ischemic cardiomyopathy: Secondary | ICD-10-CM

## 2016-02-02 DIAGNOSIS — I5022 Chronic systolic (congestive) heart failure: Secondary | ICD-10-CM | POA: Diagnosis not present

## 2016-02-02 NOTE — Patient Instructions (Signed)
Medication Instructions:  Continue all current medications.  Labwork: none  Testing/Procedures: none  Follow-Up: Your physician wants you to follow up in:  1 year.  You will receive a reminder letter in the mail one-two months in advance.  If you don't receive a letter, please call our office to schedule the follow up appointment   Any Other Special Instructions Will Be Listed Below (If Applicable). Remote monitoring is used to monitor your Pacemaker of ICD from home. This monitoring reduces the number of office visits required to check your device to one time per year. It allows us to keep an eye on the functioning of your device to ensure it is working properly. You are scheduled for a device check from home on 05/03/2016.  You may send your transmission at any time that day. If you have a wireless device, the transmission will be sent automatically. After your physician reviews your transmission, you will receive a postcard with your next transmission date.  If you need a refill on your cardiac medications before your next appointment, please call your pharmacy.  

## 2016-02-02 NOTE — Progress Notes (Signed)
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 male who presents today for electrophysiology followup.   He has developed worsening CHF.  He is now followed in the advanced heart failure clinic.  He was recently hospitalized.  He is making some improvement but continues to have SOB and edema. He smoked for 40 years previously.  Today, he denies symptoms of palpitations, chest pain, dizziness, presyncope, syncope, or ICD shocks.  The patient is otherwise without complaint today.   Past Medical History:  Diagnosis Date  . AICD (automatic cardioverter/defibrillator) present   . Anxiety   . CAD (coronary artery disease)   . CHF (congestive heart failure) ([place])   . DOE (dyspnea on exertion)   . Essential hypertension   . Family history of adverse reaction to anesthesia    "sister would go into seizures"  . Hyperlipidemia   . Ischemic cardiomyopathy    EF 20% by echo 2013  . Myocardial infarction acute ([place]) 2003  . 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 Other pulmonary embolism and infarction    On coumadin  . Pulmonary hypertension (HCC)    56 mm Hg  . Sleep apnea    "tried machine a couple nights; couldn't do it" (11/03/2015)  . Stroke [facility])    left side facial droop since; don't know when it was" (11/03/2015)  . Tricuspid regurgitation    Severe   Past Surgical History:  Procedure Laterality Date  . BURR HOLE Left 11/15/2015   Procedure: Left side burr holes;  Surgeon: Ashok Pall, MD;  [facility];  Service: Neurosurgery;  Laterality: Left;  . CARDIAC CATHETERIZATION N/A 11/11/2015   Procedure: Right/Left Heart Cath and Coronary Angiography;  Surgeon: Jolaine Artist, MD;  Location: [place] CV LAB;  Service: Cardiovascular;  Laterality: N/A;  . CARDIAC DEFIBRILLATOR PLACEMENT  11/09/06   SJM Epic II DR ICD implanted in [place], atrial lead no longer functions due to low impedance  . COLONOSCOPY WITH ESOPHAGOGASTRODUODENOSCOPY  (EGD)  06/06/2012   Procedure: COLONOSCOPY WITH ESOPHAGOGASTRODUODENOSCOPY (EGD);  Surgeon: Rogene Houston, MD;  [facility];  Service: Endoscopy;  Laterality: N/A;  310  . CORONARY ANGIOPLASTY WITH STENT PLACEMENT  "<2003  . CORONARY ARTERY BYPASS GRAFT  2003   4 vessel  . IMPLANTABLE CARDIOVERTER DEFIBRILLATOR (ICD) GENERATOR CHANGE N/A 05/27/2012   Procedure: ICD GENERATOR CHANGE;  Surgeon: Thompson Grayer, MD;  Location: [facility] CATH LAB;  Service: Cardiovascular;  Laterality: N/A;  . IMPLANTABLE CARDIOVERTER DEFIBRILLATOR GENERATOR CHANGE  05/27/2012   Gen change.  SJM Assura VR.  Riata V lead   ROS- all systems are reviewed and negative except as per HPI above  Current Outpatient Prescriptions  Medication Sig Dispense Refill  . ferrous sulfate 325 (65 FE) MG tablet Take 325 mg by mouth daily with breakfast. Reported on 12/16/2015    . magnesium oxide (MAG-OX) 400 MG tablet Take 1 tablet (400 mg total) by mouth daily. 30 tablet 3  . [START ON 02/03/2016] metolazone (ZAROXOLYN) 2.5 MG tablet Take 1 tablet (2.5 mg total) by mouth 2 (two) times a week. Every Wed and Sat 10 tablet 3  . oxyCODONE (OXY IR/ROXICODONE) 5 MG immediate release tablet Take 1 tablet (5 mg total) by mouth every 4 (four) hours as needed for moderate pain. 50 tablet 0  . polyethylene glycol (MIRALAX / GLYCOLAX) packet Take 17 g by mouth daily as needed for mild constipation. Reported on  12/09/2015    . potassium chloride SA (K-DUR,KLOR-CON) 20 MEQ tablet Take 2 tablets (40 mEq total) by mouth 2 (two) times daily. Take an extra tab when you take metolazone 130 tablet 3  . torsemide (DEMADEX) 20 MG tablet Take 4 tablets (80 mg total) by mouth 2 (two) times daily. 240 tablet 3  . warfarin (COUMADIN) 3 MG tablet Take 3 mg by mouth daily.     No current facility-administered medications for this visit.     Physical Exam: Vitals:   02/02/16 1143  BP: 100/68  Pulse: 71  SpO2: 99%  Weight: 186 lb (84.4 kg)  Height: 5'  11" (1.803 m)    GEN- The patient is well appearing, alert and oriented x 3 today.   Head- normocephalic, atraumatic Eyes-  Sclera clear, conjunctiva pink Ears- hearing intact Oropharynx- clear Lungs- Clear to ausculation bilaterally, normal work of breathing Chest- ICD pocket is well healed Heart- Regular rate and rhythm,3/6 SEM LLSB GI- soft, NT, ND, + BS Extremities- no clubbing, cyanosis, or edema Diminished DP/PT pulses bilaterally Neuro- strength/ sensation intact MS- no atrophy  ICD interrogation- reviewed in detail today,  See PACEART report  Assessment and Plan:  1.  Chronic systolic dysfunction/ Severe valvular heart disease Stable on an appropriate medical regimen Normal ICD function See Pace Art report No changes today 2 gram sodium diet/ daily weights encouraged  2. HTN Stable No change required today  3. Prior PTE Has severe TR on exam and by echo Continue coumadin long term  Return to see me in 1year [place] Follow-up with Dr Harl Bowie and CHF clinic as scheduled   Thompson Grayer MD, [facility] 02/02/2016 5:38 PM

## 2016-02-03 ENCOUNTER — Telehealth: Payer: Self-pay | Admitting: Physician Assistant

## 2016-02-03 ENCOUNTER — Inpatient Hospital Stay (HOSPITAL_COMMUNITY)
Admission: AD | Admit: 2016-02-03 | Discharge: 2016-02-08 | DRG: 291 | Disposition: A | Discharge status: Home Health | Payer: Medicare Other | Source: Ambulatory Visit | Attending: Internal Medicine | Admitting: Internal Medicine

## 2016-02-03 ENCOUNTER — Encounter (HOSPITAL_COMMUNITY): Payer: Self-pay | Admitting: General Practice

## 2016-02-03 ENCOUNTER — Inpatient Hospital Stay (HOSPITAL_COMMUNITY): Payer: Medicare Other

## 2016-02-03 DIAGNOSIS — K7460 Unspecified cirrhosis of liver: Secondary | ICD-10-CM | POA: Diagnosis present

## 2016-02-03 DIAGNOSIS — R0602 Shortness of breath: Secondary | ICD-10-CM | POA: Diagnosis not present

## 2016-02-03 DIAGNOSIS — I471 Supraventricular tachycardia, unspecified: Secondary | ICD-10-CM

## 2016-02-03 DIAGNOSIS — E785 Hyperlipidemia, unspecified: Secondary | ICD-10-CM | POA: Diagnosis present

## 2016-02-03 DIAGNOSIS — Z7901 Long term (current) use of anticoagulants: Secondary | ICD-10-CM

## 2016-02-03 DIAGNOSIS — G4733 Obstructive sleep apnea (adult) (pediatric): Secondary | ICD-10-CM | POA: Diagnosis present

## 2016-02-03 DIAGNOSIS — Z79899 Other long term (current) drug therapy: Secondary | ICD-10-CM

## 2016-02-03 DIAGNOSIS — Z951 Presence of aortocoronary bypass graft: Secondary | ICD-10-CM | POA: Diagnosis not present

## 2016-02-03 DIAGNOSIS — I272 Other secondary pulmonary hypertension: Secondary | ICD-10-CM | POA: Diagnosis present

## 2016-02-03 DIAGNOSIS — I252 Old myocardial infarction: Secondary | ICD-10-CM | POA: Diagnosis not present

## 2016-02-03 DIAGNOSIS — Z452 Encounter for adjustment and management of vascular access device: Secondary | ICD-10-CM | POA: Diagnosis not present

## 2016-02-03 DIAGNOSIS — Z955 Presence of coronary angioplasty implant and graft: Secondary | ICD-10-CM | POA: Diagnosis not present

## 2016-02-03 DIAGNOSIS — I071 Rheumatic tricuspid insufficiency: Secondary | ICD-10-CM | POA: Diagnosis present

## 2016-02-03 DIAGNOSIS — I5021 Acute systolic (congestive) heart failure: Secondary | ICD-10-CM

## 2016-02-03 DIAGNOSIS — N183 Chronic kidney disease, stage 3 unspecified: Secondary | ICD-10-CM

## 2016-02-03 DIAGNOSIS — Z86711 Personal history of pulmonary embolism: Secondary | ICD-10-CM

## 2016-02-03 DIAGNOSIS — Z87891 Personal history of nicotine dependence: Secondary | ICD-10-CM | POA: Diagnosis not present

## 2016-02-03 DIAGNOSIS — Z515 Encounter for palliative care: Secondary | ICD-10-CM

## 2016-02-03 DIAGNOSIS — N184 Chronic kidney disease, stage 4 (severe): Secondary | ICD-10-CM | POA: Diagnosis present

## 2016-02-03 DIAGNOSIS — I2510 Atherosclerotic heart disease of native coronary artery without angina pectoris: Secondary | ICD-10-CM | POA: Diagnosis present

## 2016-02-03 DIAGNOSIS — I130 Hypertensive heart and chronic kidney disease with heart failure and stage 1 through stage 4 chronic kidney disease, or unspecified chronic kidney disease: Secondary | ICD-10-CM | POA: Diagnosis present

## 2016-02-03 DIAGNOSIS — Z95810 Presence of automatic (implantable) cardiac defibrillator: Secondary | ICD-10-CM

## 2016-02-03 DIAGNOSIS — M109 Gout, unspecified: Secondary | ICD-10-CM | POA: Diagnosis not present

## 2016-02-03 DIAGNOSIS — I5043 Acute on chronic combined systolic (congestive) and diastolic (congestive) heart failure: Secondary | ICD-10-CM | POA: Diagnosis present

## 2016-02-03 DIAGNOSIS — F419 Anxiety disorder, unspecified: Secondary | ICD-10-CM | POA: Diagnosis present

## 2016-02-03 DIAGNOSIS — Z8673 Personal history of transient ischemic attack (TIA), and cerebral infarction without residual deficits: Secondary | ICD-10-CM

## 2016-02-03 DIAGNOSIS — I255 Ischemic cardiomyopathy: Secondary | ICD-10-CM | POA: Diagnosis present

## 2016-02-03 DIAGNOSIS — I509 Heart failure, unspecified: Secondary | ICD-10-CM | POA: Diagnosis not present

## 2016-02-03 LAB — MAGNESIUM: Magnesium: 2.2 mg/dL (ref 1.7–2.4)

## 2016-02-03 LAB — CBC WITH DIFFERENTIAL/PLATELET
BASOS ABS: 0 10*3/uL (ref 0.0–0.1)
BASOS PCT: 0 %
Eosinophils Absolute: 0.1 10*3/uL (ref 0.0–0.7)
Eosinophils Relative: 2 %
HEMATOCRIT: 37.6 % — AB (ref 39.0–52.0)
HEMOGLOBIN: 11.7 g/dL — AB (ref 13.0–17.0)
LYMPHS PCT: 19 %
Lymphs Abs: 1.4 10*3/uL (ref 0.7–4.0)
MCH: 29 pg (ref 26.0–34.0)
MCHC: 31.1 g/dL (ref 30.0–36.0)
MCV: 93.3 fL (ref 78.0–100.0)
MONO ABS: 1 10*3/uL (ref 0.1–1.0)
MONOS PCT: 13 %
NEUTROS ABS: 4.7 10*3/uL (ref 1.7–7.7)
NEUTROS PCT: 66 %
Platelets: 138 10*3/uL — ABNORMAL LOW (ref 150–400)
RBC: 4.03 MIL/uL — ABNORMAL LOW (ref 4.22–5.81)
RDW: 15.8 % — AB (ref 11.5–15.5)
WBC: 7.2 10*3/uL (ref 4.0–10.5)

## 2016-02-03 LAB — CUP PACEART INCLINIC DEVICE CHECK
Date Time Interrogation Session: 20170830154951
HIGH POWER IMPEDANCE MEASURED VALUE: 38.7206
Implantable Lead Model: 7040
Lead Channel Impedance Value: 650 Ohm
Lead Channel Pacing Threshold Amplitude: 1 V
Lead Channel Pacing Threshold Amplitude: 1 V
Lead Channel Pacing Threshold Pulse Width: 0.5 ms
Lead Channel Sensing Intrinsic Amplitude: 11.7 mV
MDC IDC LEAD IMPLANT DT: 20080606
MDC IDC LEAD LOCATION: 753860
MDC IDC MSMT BATTERY REMAINING LONGEVITY: 70.8
MDC IDC MSMT LEADCHNL RV PACING THRESHOLD PULSEWIDTH: 0.5 ms
MDC IDC SET LEADCHNL RV PACING AMPLITUDE: 2.5 V
MDC IDC SET LEADCHNL RV PACING PULSEWIDTH: 0.5 ms
MDC IDC SET LEADCHNL RV SENSING SENSITIVITY: 0.5 mV
MDC IDC STAT BRADY RV PERCENT PACED: 0.07 %
Pulse Gen Serial Number: 7025822

## 2016-02-03 LAB — TROPONIN I
Troponin I: 0.03 ng/mL (ref <0.03)
Troponin I: <0.03 ng/mL (ref <0.03)

## 2016-02-03 LAB — COMPREHENSIVE METABOLIC PANEL
ALBUMIN: 3.5 g/dL (ref 3.5–5.0)
ALK PHOS: 123 U/L (ref 38–126)
ALT: 16 U/L — ABNORMAL LOW (ref 17–63)
AST: 27 U/L (ref 15–41)
Anion gap: 7 (ref 5–15)
BILIRUBIN TOTAL: 1.9 mg/dL — AB (ref 0.3–1.2)
BUN: 38 mg/dL — AB (ref 6–20)
CALCIUM: 9.5 mg/dL (ref 8.9–10.3)
CO2: 25 mmol/L (ref 22–32)
Chloride: 100 mmol/L — ABNORMAL LOW (ref 101–111)
Creatinine, Ser: 1.71 mg/dL — ABNORMAL HIGH (ref 0.61–1.24)
GFR calc Af Amer: 45 mL/min — ABNORMAL LOW (ref >60)
GFR, EST NON AFRICAN AMERICAN: 39 mL/min — AB (ref >60)
GLUCOSE: 113 mg/dL — AB (ref 65–99)
Potassium: 4.4 mmol/L (ref 3.5–5.1)
Sodium: 132 mmol/L — ABNORMAL LOW (ref 135–145)
TOTAL PROTEIN: 7.8 g/dL (ref 6.5–8.1)

## 2016-02-03 LAB — MRSA PCR SCREENING: MRSA BY PCR: NEGATIVE

## 2016-02-03 LAB — BRAIN NATRIURETIC PEPTIDE: B Natriuretic Peptide: 838.4 pg/mL — ABNORMAL HIGH (ref 0.0–100.0)

## 2016-02-03 LAB — PROTIME-INR
INR: 1.4
PROTHROMBIN TIME: 17.3 s — AB (ref 11.4–15.2)

## 2016-02-03 LAB — APTT: APTT: 39 s — AB (ref 24–36)

## 2016-02-03 LAB — TSH: TSH: 3.044 u[IU]/mL (ref 0.350–4.500)

## 2016-02-03 MED ORDER — SODIUM CHLORIDE 0.9 % IV SOLN: 250.0000 mL | INTRAVENOUS | Status: DC | PRN

## 2016-02-03 MED ORDER — FUROSEMIDE 10 MG/ML IJ SOLN
80.0000 mg | Freq: Two times a day (BID) | INTRAMUSCULAR | Status: DC
  Administered 2016-02-03 – 2016-02-05 (×6): 80 mg via INTRAVENOUS
  Filled 2016-02-03 (×6): qty 8

## 2016-02-03 MED ORDER — METOLAZONE 5 MG PO TABS
2.5000 mg | ORAL_TABLET | Freq: Once | ORAL | Status: AC
  Administered 2016-02-03: 2.5 mg via ORAL
  Filled 2016-02-03: qty 1

## 2016-02-03 MED ORDER — WARFARIN - PHARMACIST DOSING INPATIENT: Freq: Every day | Status: DC

## 2016-02-03 MED ORDER — FERROUS SULFATE 325 (65 FE) MG PO TABS
325.0000 mg | ORAL_TABLET | Freq: Every day | ORAL | Status: DC
  Administered 2016-02-04 – 2016-02-08 (×5): 325 mg via ORAL
  Filled 2016-02-03 (×5): qty 1

## 2016-02-03 MED ORDER — POTASSIUM CHLORIDE CRYS ER 20 MEQ PO TBCR
40.0000 meq | EXTENDED_RELEASE_TABLET | Freq: Two times a day (BID) | ORAL | Status: DC
  Administered 2016-02-03 – 2016-02-05 (×6): 40 meq via ORAL
  Filled 2016-02-03 (×6): qty 2

## 2016-02-03 MED ORDER — FUROSEMIDE 10 MG/ML IJ SOLN: 80.0000 mg | Freq: Two times a day (BID) | INTRAMUSCULAR | Status: DC

## 2016-02-03 MED ORDER — MILRINONE LACTATE IN DEXTROSE 20-5 MG/100ML-% IV SOLN
0.2500 ug/kg/min | INTRAVENOUS | Status: DC
  Administered 2016-02-03 – 2016-02-07 (×6): 0.25 ug/kg/min via INTRAVENOUS
  Filled 2016-02-03 (×6): qty 100

## 2016-02-03 MED ORDER — MAGNESIUM OXIDE 400 MG PO TABS: 400.0000 mg | ORAL_TABLET | Freq: Every day | ORAL | Status: DC

## 2016-02-03 MED ORDER — SODIUM CHLORIDE 0.9% FLUSH
10.0000 mL | Freq: Two times a day (BID) | INTRAVENOUS | Status: DC
  Administered 2016-02-03 – 2016-02-08 (×5): 10 mL

## 2016-02-03 MED ORDER — ONDANSETRON HCL 4 MG/2ML IJ SOLN: 4.0000 mg | Freq: Four times a day (QID) | INTRAMUSCULAR | Status: DC | PRN

## 2016-02-03 MED ORDER — POLYETHYLENE GLYCOL 3350 17 G PO PACK: 17.0000 g | PACK | Freq: Every day | ORAL | Status: DC | PRN

## 2016-02-03 MED ORDER — SODIUM CHLORIDE 0.9% FLUSH
3.0000 mL | Freq: Two times a day (BID) | INTRAVENOUS | Status: DC
  Administered 2016-02-04 (×2): 3 mL via INTRAVENOUS

## 2016-02-03 MED ORDER — MAGNESIUM OXIDE 400 (241.3 MG) MG PO TABS
400.0000 mg | ORAL_TABLET | Freq: Every day | ORAL | Status: DC
  Administered 2016-02-03 – 2016-02-08 (×6): 400 mg via ORAL
  Filled 2016-02-03 (×6): qty 1

## 2016-02-03 MED ORDER — WARFARIN SODIUM 5 MG PO TABS
5.0000 mg | ORAL_TABLET | Freq: Once | ORAL | Status: AC
  Administered 2016-02-03: 5 mg via ORAL
  Filled 2016-02-03: qty 1

## 2016-02-03 MED ORDER — SODIUM CHLORIDE 0.9% FLUSH
3.0000 mL | INTRAVENOUS | Status: DC | PRN
Start: 2016-02-03 — End: 2016-02-08

## 2016-02-03 MED ORDER — OXYCODONE HCL 5 MG PO TABS
5.0000 mg | ORAL_TABLET | ORAL | Status: DC | PRN
  Administered 2016-02-03: 5 mg via ORAL
  Filled 2016-02-03: qty 1

## 2016-02-03 MED ORDER — ACETAMINOPHEN 325 MG PO TABS: 650.0000 mg | ORAL_TABLET | ORAL | Status: DC | PRN

## 2016-02-03 MED ORDER — SODIUM CHLORIDE 0.9% FLUSH
10.0000 mL | INTRAVENOUS | Status: DC | PRN
  Administered 2016-02-07: 20 mL
  Filled 2016-02-03: qty 40

## 2016-02-03 NOTE — Progress Notes (Signed)
Peripherally Inserted Central Catheter/Midline Placement  The IV Nurse has discussed with the patient and/or persons authorized to consent for the patient, the purpose of this procedure and the potential benefits and risks involved with this procedure.  The benefits include less needle sticks, lab draws from the catheter and patient may be discharged home with the catheter.  Risks include, but not limited to, infection, bleeding, blood clot (thrombus formation), and puncture of an artery; nerve damage and irregular heat beat.  Alternatives to this procedure were also discussed.  PICC/Midline Placement Documentation  PICC Double Lumen 02/03/16 PICC Right Brachial 43 cm 0 cm (Active)  Indication for Insertion or Continuance of Line Prolonged intravenous therapies 02/03/2016  2:00 PM  Exposed Catheter (cm) 0 cm 02/03/2016  2:00 PM  Dressing Change Due 02/10/16 02/03/2016  2:00 PM       Christella Noa Albarece 02/03/2016, 2:19 PM

## 2016-02-03 NOTE — Plan of Care (Signed)
Problem: Education: Goal: Knowledge of [facility] Education information/materials will improve Outcome: Progressing Pt given "living better with heart failure" booklet. Reviewed daily weights, fluid restriction and salt restrictions. Pt has no further questions at this time. Will continue to monitor.

## 2016-02-03 NOTE — Progress Notes (Addendum)
Paged by radiology and then RN due to improper PICC line placement ( Progressed into neck instead of SVC)     Will have IR place PICC line in am.      With proven previous low output HF, will initiate milrinone 0.25 mcg/kg/min via peripheral line for now.    Legrand Como [address]" [place], PA-C 02/03/2016 3:47 PM

## 2016-02-03 NOTE — Progress Notes (Signed)
CRITICAL VALUE ALERT  Critical value received:  Troponin 0.03  Date of notification:  02/03/2016  Time of notification:  [id number]  Critical value read back:Yes.    Nurse who received alert:  Hubert Azure RN  MD notified (1st page):  Shirley Friar PA  Time of first page:  [phone]  MD notified (2nd page):  Time of second page:  Responding MD:  Shirley Friar PA  Time MD responded:  [id number]   No new orders received.  Sanda Linger

## 2016-02-03 NOTE — Progress Notes (Signed)
PT Cancellation Note  Patient Details Name: Rick [name] MRN: [id number] DOB: [birth year]   Cancelled Treatment:    Reason Eval/Treat Not Completed: Fatigue/lethargy limiting ability to participate;Patient at procedure or test/unavailable.  Patient very lethargic/sleepy.  RN's present for procedure.  Will return tomorrow for PT evaluation   Despina Pole 02/03/2016, 3:23 PM Carita Pian. Sanjuana Kava, [place] Pager [phone]

## 2016-02-03 NOTE — Telephone Encounter (Signed)
CHF pt, called because sx no better, feels he may be getting worse, despite recent med increases.  Advised him to hold his am oral diuretics and I would contact the CHF team to discuss admission.  Contacted CHF team, they will f/u with patient.  Rosaria Ferries, Hershal Coria 02/03/2016 8:38 AM Beeper [phone]

## 2016-02-03 NOTE — Progress Notes (Signed)
OT Cancellation Note  Patient Details Name: Rick [name] MRN: [id number] DOB: 02-03-1946   Cancelled Treatment:    Reason Eval/Treat Not Completed: Patient at procedure or test/ unavailable (Having x-ray done in room)  Redmond Baseman, OTR/L Pager: [phone] 02/03/2016, 3:23 PM

## 2016-02-03 NOTE — Progress Notes (Signed)
ANTICOAGULATION CONSULT NOTE - Initial Consult  Pharmacy Consult for warfarin Indication: pulmonary embolus, CVA  No Known Allergies  Patient Measurements: Height: 5\' 11"  (180.3 cm) Weight: 181 lb (82.1 kg) IBW/kg (Calculated) : 75.3  Vital Signs: Temp: 98.9 F (37.2 C) (08/31 1100) Temp Source: Oral (08/31 1100) BP: 107/64 (08/31 1100) Pulse Rate: 74 (08/31 1100)  Labs:  Recent Labs  02/01/16 1618 02/03/16 1210  HGB  --  11.7*  HCT  --  37.6*  PLT  --  138*  APTT  --  39*  LABPROT  --  17.3*  INR  --  1.40  CREATININE 1.66* 1.71*  TROPONINI  --  0.03*    Estimated Creatinine Clearance: 42.8 mL/min (by C-G formula based on SCr of 1.71 mg/dL).   Medical History: Past Medical History:  Diagnosis Date  . AICD (automatic cardioverter/defibrillator) present   . Anxiety   . CAD (coronary artery disease)   . CHF (congestive heart failure) ([place])   . DOE (dyspnea on exertion)   . Essential hypertension   . Family history of adverse reaction to anesthesia    "sister would go into seizures"  . Hyperlipidemia   . Ischemic cardiomyopathy    EF 20% by echo 2013  . Myocardial infarction acute ([place]) 2003  . 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 Other pulmonary embolism and infarction    On coumadin  . Pulmonary hypertension (HCC)    56 mm Hg  . Sleep apnea    "tried machine a couple nights; couldn't do it" (11/03/2015)  . Stroke [facility])    left side facial droop since; don't know when it was" (11/03/2015)  . Tricuspid regurgitation    Severe    Assessment: 70 yo male w/PMH of Systolic HF, St. Jude ICD,  HTN, PE and CVA admitted with worsening CHF sx. Patient was direct admit for IV diuresis and evaluation of cardiac output. Last admitted 6/10-6/17 for subdural hematoma and HF.  Pharmacy consulted for warfarin management. PTA warfarin 3mg  daily for Hx of PE and questionable stroke.   8/31: INR 1.4   Goal of Therapy:  INR 2-3 Monitor platelets by anticoagulation  protocol: Yes   Plan:  INR subtherapeutic  Will give Warfarin 5mg  today  F/u INR with am Labs Monitor S/Sx of bleeding  Nancy Fetter, PharmD Clinical Pharmacist 02/03/2016 2:00 PM

## 2016-02-03 NOTE — H&P (Signed)
Advanced Heart Failure Team History and Physical Note   Primary Physician:  Wende Neighbors, MD Primary Cardiologist:  Dr. Haroldine Laws   Reason for Admission: A/C biventricular HF   HPI:    Rick [name] is a 70 y.o. male with history of ischemic cardiomyopathy status post CABG in 2003 as well as previous stenting. Most recent 2-D echo in 2016 EF 25% with restrictive diastolic dysfunction, moderate RV dysfunction PAS P 64, status post ICD followed by Dr. Rayann Heman with normal check in 02/2015. In June 2017 had subdural hema  Pt admitted 5/4 - 10/13/15 with recurrent volume overload in setting of dietary non-compliance. Echo during that admission showed EF worsening to 15-20% from 25%.  Seen in Dr. Harl Bowie office 10/20/15. Mild volume overload with dietary indiscretion. Torsemide increased to 60/40 mg. Weight at that visit 159 lbs  Admitted 6/10 through 11/20/15 with subdural hematoma and heart failure. Had evacuation of subdural hematoma. Required short term inotropes.  He was discharged off bb and ace. Discharge weight was 147 pounds.   Presented to HF clinic 02/01/16. Had continued weight gain and worsening dyspnea despite addition of metolazone.  DOE with ADLs and walking short distances.  + Bendopnea. Torsemide and metolazone increased with close follow up scheduled.   He called the office am of 02/03/16 for continued weight gain and worsening SOB despite increase in diuretics as above.  SOB with minimal exertion, decreased urine output. Decided to directly admit for IV diuresis and further possible inotropic support.    Review of Systems: [y] = yes, [ ]  = no   General: Weight gain [y]; Weight loss [ ] ; Anorexia [ ] ; Fatigue [ ] ; Fever [ ] ; Chills [ ] ; Weakness [ ]   Cardiac: Chest pain/pressure [ ] ; Resting SOB [ ] ; Exertional SOB [y]; Orthopnea [y]; Pedal Edema [y]; Palpitations [ ] ; Syncope [ ] ; Presyncope [ ] ; Paroxysmal nocturnal dyspnea[ ]   Pulmonary: Cough [y]; Wheezing[ ] ; Hemoptysis[ ] ;  Sputum [ ] ; Snoring [ ]   GI: Vomiting[ ] ; Dysphagia[ ] ; Melena[ ] ; Hematochezia [ ] ; Heartburn[ ] ; Abdominal pain [ ] ; Constipation [ ] ; Diarrhea [ ] ; BRBPR [ ]   GU: Hematuria[ ] ; Dysuria [ ] ; Nocturia[ ]   Vascular: Pain in legs with walking [ ] ; Pain in feet with lying flat [ ] ; Non-healing sores [ ] ; Stroke [ ] ; TIA [ ] ; Slurred speech [ ] ;  Neuro: Headaches[ ] ; Vertigo[ ] ; Seizures[ ] ; Paresthesias[ ] ;Blurred vision [ ] ; Diplopia [ ] ; Vision changes [ ]   Ortho/Skin: Arthritis [y]; Joint pain [y]; Muscle pain [ ] ; Joint swelling [ ] ; Back Pain [ ] ; Rash [ ]   Psych: Depression[ ] ; Anxiety[ ]   Heme: Bleeding problems [ ] ; Clotting disorders [ ] ; Anemia [ ]   Endocrine: Diabetes [ ] ; Thyroid dysfunction[ ]    Home Medications Prior to Admission medications   Medication Sig Start Date End Date Taking? Authorizing Provider  ferrous sulfate 325 (65 FE) MG tablet Take 325 mg by mouth daily with breakfast. Reported on 12/16/2015   Yes Historical Provider, MD  magnesium oxide (MAG-OX) 400 MG tablet Take 1 tablet (400 mg total) by mouth daily. 01/27/16  Yes Amy D Clegg, NP  metolazone (ZAROXOLYN) 2.5 MG tablet Take 1 tablet (2.5 mg total) by mouth 2 (two) times a week. Every Wed and Sat 02/03/16  Yes Larey Dresser, MD  oxyCODONE (OXY IR/ROXICODONE) 5 MG immediate release tablet Take 1 tablet (5 mg total) by mouth every 4 (four) hours as needed for moderate pain.  11/20/15  Yes Newman Pies, MD  polyethylene glycol [facility] / Floria Raveling) packet Take 17 g by mouth daily as needed for mild constipation. Reported on 12/09/2015   Yes Historical Provider, MD  potassium chloride SA (K-DUR,KLOR-CON) 20 MEQ tablet Take 2 tablets (40 mEq total) by mouth 2 (two) times daily. Take an extra tab when you take metolazone 02/01/16  Yes Larey Dresser, MD  torsemide (DEMADEX) 20 MG tablet Take 4 tablets (80 mg total) by mouth 2 (two) times daily. 02/01/16  Yes Larey Dresser, MD  warfarin (COUMADIN) 3 MG tablet Take 3 mg by  mouth daily.   Yes Historical Provider, MD    Past Medical History: Past Medical History:  Diagnosis Date  . AICD (automatic cardioverter/defibrillator) present   . Anxiety   . CAD (coronary artery disease)   . CHF (congestive heart failure) ([place])   . DOE (dyspnea on exertion)   . Essential hypertension   . Family history of adverse reaction to anesthesia    "sister would go into seizures"  . Hyperlipidemia   . Ischemic cardiomyopathy    EF 20% by echo 2013  . Myocardial infarction acute ([place]) 2003  . 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 Other pulmonary embolism and infarction    On coumadin  . Pulmonary hypertension (HCC)    56 mm Hg  . Sleep apnea    "tried machine a couple nights; couldn't do it" (11/03/2015)  . Stroke [facility])    left side facial droop since; don't know when it was" (11/03/2015)  . Tricuspid regurgitation    Severe    Past Surgical History: Past Surgical History:  Procedure Laterality Date  . BURR HOLE Left 11/15/2015   Procedure: Left side burr holes;  Surgeon: Ashok Pall, MD;  Location: [place] NEURO ORS;  Service: Neurosurgery;  Laterality: Left;  . CARDIAC CATHETERIZATION N/A 11/11/2015   Procedure: Right/Left Heart Cath and Coronary Angiography;  Surgeon: Jolaine Artist, MD;  Location: [place] CV LAB;  Service: Cardiovascular;  Laterality: N/A;  . CARDIAC DEFIBRILLATOR PLACEMENT  11/09/06   SJM Epic II DR ICD implanted in [place], atrial lead no longer functions due to low impedance  . COLONOSCOPY WITH ESOPHAGOGASTRODUODENOSCOPY (EGD)  06/06/2012   Procedure: COLONOSCOPY WITH ESOPHAGOGASTRODUODENOSCOPY (EGD);  Surgeon: Rogene Houston, MD;  [facility];  Service: Endoscopy;  Laterality: N/A;  310  . CORONARY ANGIOPLASTY WITH STENT PLACEMENT  "<2003  . CORONARY ARTERY BYPASS GRAFT  2003   4 vessel  . IMPLANTABLE CARDIOVERTER DEFIBRILLATOR (ICD) GENERATOR CHANGE N/A 05/27/2012   Procedure: ICD GENERATOR CHANGE;  Surgeon: Thompson Grayer, MD;   Location: [facility] CATH LAB;  Service: Cardiovascular;  Laterality: N/A;  . IMPLANTABLE CARDIOVERTER DEFIBRILLATOR GENERATOR CHANGE  05/27/2012   Gen change.  SJM Assura VR.  Riata V lead    Family History:  Family History  Problem Relation Age of Onset  . Heart disease Mother     CAD  . Leukemia Father     Social History: Social History   Social History  . Marital status: Married    Spouse name: N/A  . Number of children: 4  . Years of education: N/A   Occupational History  . Disabled.    Social History Main Topics  . Smoking status: Former Smoker    Packs/day: 0.50    Years: 38.00    Types: Cigarettes    Start date: 12/30/1956    Quit date: 06/06/1995  . Smokeless tobacco: Never Used  .  Alcohol use 1.8 oz/week    3 Cans of beer per week     Comment: 11/03/2015 "Quit from [id number]; weaning myself off again"  . Drug use: No  . Sexual activity: Not Currently   Other Topics Concern  . Not on file   Social History Narrative   Lives with wife and brother in [place] and grandson in [place].      Allergies:  No Known Allergies  Objective:    Vital Signs:   Temp:  [98.9 F (37.2 C)] 98.9 F (37.2 C) (08/31 1100) Pulse Rate:  [74] 74 (08/31 1100) BP: (107)/(64) 107/64 (08/31 1100) SpO2:  [99 %] 99 % (08/31 1100) Weight:  [181 lb (82.1 kg)] 181 lb (82.1 kg) (08/31 1150) Last BM Date: 02/02/16 Filed Weights   02/03/16 1150  Weight: 181 lb (82.1 kg)    Physical Exam: General: Elderly and fatigued appearing.  HEENT: normal Neck: supple. JVD to ear. Carotids 2+ bilat; no bruits. No lymphadenopathy or thyromegaly appreciated. Cor: PMI nondisplaced. RRR. No rubs. 3/6 MR.  No S3. Lungs: Diminished basilar sounds, with scant crackles Abdomen: soft, NT, ND, no HSM. No bruits or masses. +BS  Extremities: no cyanosis, clubbing, rash. 1-2+ edema Neuro: alert & oriented x 3, cranial nerves grossly intact. moves all 4 extremities w/o difficulty. Affect  pleasant.   Telemetry: Reviewed, NSR   Labs: Basic Metabolic Panel:  Recent Labs Lab 02/01/16 1618  NA 134*  K 3.9  CL 103  CO2 24  GLUCOSE 134*  BUN 28*  CREATININE 1.66*  CALCIUM 9.4    Liver Function Tests: No results for input(s): AST, ALT, ALKPHOS, BILITOT, PROT, ALBUMIN in the last 168 hours. No results for input(s): LIPASE, AMYLASE in the last 168 hours. No results for input(s): AMMONIA in the last 168 hours.  CBC: No results for input(s): WBC, NEUTROABS, HGB, HCT, MCV, PLT in the last 168 hours.  Cardiac Enzymes: No results for input(s): CKTOTAL, CKMB, CKMBINDEX, TROPONINI in the last 168 hours.  BNP: BNP (last 3 results)  Recent Labs  12/16/15 1015 01/18/16 1220 02/01/16 1619  BNP 454.4* 1,572.0* 1,585.7*    ProBNP (last 3 results) No results for input(s): PROBNP in the last 8760 hours.   CBG: No results for input(s): GLUCAP in the last 168 hours.  Coagulation Studies: No results for input(s): LABPROT, INR in the last 72 hours.  Other results: EKG: Pending  Imaging:  No results found.   Assessment:   1. 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 CAD s/p CABG 3. Hx of CVA - on coumadin 4. Hx of Subdural Hematoma s/p evacuation on 11/15/15. 5. CKD stage III-IV 6. OSA intolerant to CPAP.   Plan/Discussion:    He is markedly volume overloaded on exam.   Will start lasix 80 mg IV BID with 2.5 mg metolazone.  Labs and EKG pending.   Will get CXR and repeat Echo.  Place PICC line for CVP and Coox.  Suspect may need to go on milrinone, although not ideal candidate for home inotropic support.   Will likely need RHC once diuresed.   With advanced age and comorbidities,patient has agreed to see palliative care.  Pt has verbalized to me he does NOT want to be intubated. Unsure about chest compressions.   Length of Stay: 0  Annamaria Helling 02/03/2016, 12:19  PM  Advanced Heart Failure Team Pager [phone] (M-F; 7a - 4p)  Please contact [facility] for night-coverage after hours (4p -7a ) and weekends on [email or web address]  Patient seen and examined with Oda Kilts, PA-C. We discussed all aspects of the encounter. I agree with the assessment and plan as stated above.   Rick [name] has severe/end-stage biventricular HF with marked volume overload not responding to intensification of oral diuretic regimen. Will admit for IV lasix and possible inotropic support. Will need PICC line. We have initiated [place] discussions with him. Will have Palliative Care team see.   Bensimhon, Daniel,MD 11:00 PM

## 2016-02-04 ENCOUNTER — Encounter (HOSPITAL_COMMUNITY): Payer: Self-pay | Admitting: Interventional Radiology

## 2016-02-04 ENCOUNTER — Inpatient Hospital Stay (HOSPITAL_COMMUNITY): Payer: Medicare Other

## 2016-02-04 DIAGNOSIS — I5021 Acute systolic (congestive) heart failure: Secondary | ICD-10-CM

## 2016-02-04 DIAGNOSIS — Z515 Encounter for palliative care: Secondary | ICD-10-CM

## 2016-02-04 HISTORY — PX: IR GENERIC HISTORICAL: IMG1180011

## 2016-02-04 LAB — PROTIME-INR
INR: 1.34
Prothrombin Time: 16.7 seconds — ABNORMAL HIGH (ref 11.4–15.2)

## 2016-02-04 LAB — TROPONIN I: Troponin I: <0.03 ng/mL (ref <0.03)

## 2016-02-04 LAB — BASIC METABOLIC PANEL
Anion gap: 12 (ref 5–15)
BUN: 46 mg/dL — AB (ref 6–20)
CALCIUM: 9.9 mg/dL (ref 8.9–10.3)
CO2: 25 mmol/L (ref 22–32)
Chloride: 96 mmol/L — ABNORMAL LOW (ref 101–111)
Creatinine, Ser: 1.74 mg/dL — ABNORMAL HIGH (ref 0.61–1.24)
GFR calc Af Amer: 44 mL/min — ABNORMAL LOW (ref >60)
GFR, EST NON AFRICAN AMERICAN: 38 mL/min — AB (ref >60)
GLUCOSE: 86 mg/dL (ref 65–99)
POTASSIUM: 3.9 mmol/L (ref 3.5–5.1)
SODIUM: 133 mmol/L — AB (ref 135–145)

## 2016-02-04 LAB — CARBOXYHEMOGLOBIN
Carboxyhemoglobin: 2.7 % — ABNORMAL HIGH (ref 0.5–1.5)
METHEMOGLOBIN: 0.9 % (ref 0.0–1.5)
O2 Saturation: 62 %
TOTAL HEMOGLOBIN: 12.3 g/dL (ref 12.0–16.0)

## 2016-02-04 LAB — MAGNESIUM: MAGNESIUM: 2.7 mg/dL — AB (ref 1.7–2.4)

## 2016-02-04 MED ORDER — LIDOCAINE HCL 1 % IJ SOLN
INTRAMUSCULAR | Status: AC
  Filled 2016-02-04: qty 20

## 2016-02-04 MED ORDER — MAGNESIUM HYDROXIDE 400 MG/5ML PO SUSP
30.0000 mL | Freq: Once | ORAL | Status: AC
  Administered 2016-02-04: 30 mL via ORAL
  Filled 2016-02-04: qty 30

## 2016-02-04 MED ORDER — WARFARIN SODIUM 5 MG PO TABS
5.0000 mg | ORAL_TABLET | Freq: Once | ORAL | Status: AC
Start: 2016-02-04 — End: 2016-02-04
  Administered 2016-02-04: 5 mg via ORAL
  Filled 2016-02-04: qty 1

## 2016-02-04 MED ORDER — LIDOCAINE HCL 1 % IJ SOLN
INTRAMUSCULAR | Status: DC | PRN
  Administered 2016-02-04: 5 mL

## 2016-02-04 NOTE — Procedures (Signed)
R arm PowerPICC placed under US and fluoroscopy No ptx on spot chest radiograph. No complication No blood loss. See complete dictation in [place] PACS.  

## 2016-02-04 NOTE — Progress Notes (Signed)
ANTICOAGULATION CONSULT NOTE - Initial Consult  Pharmacy Consult for warfarin Indication: pulmonary embolus, CVA  No Known Allergies  Patient Measurements: Height: 5\' 11"  (180.3 cm) Weight: 173 lb 1.6 oz (78.5 kg) IBW/kg (Calculated) : 75.3  Vital Signs: Temp: 97.6 F (36.4 C) (09/01 0749) Temp Source: Oral (09/01 0749) BP: 105/75 (09/01 0724) Pulse Rate: 76 (09/01 0749)  Labs:  Recent Labs  02/01/16 1618 02/03/16 1210 02/03/16 1701 02/04/16 0007 02/04/16 0131  HGB  --  11.7*  --   --   --   HCT  --  37.6*  --   --   --   PLT  --  138*  --   --   --   APTT  --  39*  --   --   --   LABPROT  --  17.3*  --   --  16.7*  INR  --  1.40  --   --  1.34  CREATININE 1.66* 1.71*  --   --  1.74*  TROPONINI  --  0.03* <0.03 <0.03  --     Estimated Creatinine Clearance: 42.1 mL/min (by C-G formula based on SCr of 1.74 mg/dL).  Assessment: 70 yo male w/PMH of Systolic HF, St. Jude ICD,  HTN, PE and CVA admitted with worsening CHF sx. Patient was direct admit for IV diuresis and evaluation of cardiac output. Last admitted 6/10-6/17 for subdural hematoma and HF.  Pharmacy consulted for warfarin management. PTA warfarin 3mg  daily for Hx of PE and questionable stroke.   INR still low today at 1.3 (down). No bleeding issues noted.  Goal of Therapy:  INR 2-3 Monitor platelets by anticoagulation protocol: Yes   Plan:  INR subtherapeutic  Will give Warfarin 5mg  again today  F/u INR with am Labs Monitor S/Sx of bleeding  Erin Hearing PharmD., BCPS Clinical Pharmacist Pager [phone] 02/04/2016 10:23 AM

## 2016-02-04 NOTE — Discharge Instructions (Signed)

## 2016-02-04 NOTE — Consult Note (Signed)
Consultation Note Date: 02/04/2016   Patient Name: Rick [name]  DOB: 01-04-1946  MRN: [id number]  Age / Sex: 70 y.o., male  PCP: Celene Squibb, MD Referring Physician: Jolaine Artist, MD  Reason for Consultation: Establishing goals of care and Psychosocial/spiritual support  HPI/Patient Profile: 70 y.o. male  with past medical history of Coronary artery disease, sleep apnea (intolerant of CPAP), stroke, tricuspid regurgitation, pulmonary hypertension, nonalcoholic cirrhosis, myocardial infarction, ischemic cardiomyopathy with ejection fraction of 15-20%, hyperlipidemia, essential hypertension, AICD admitted on 02/03/2016 with weight gain, worsening shortness of breath despite increase in diuretics, patient has had a sharp functional decline over the last several months beginning in May 2017 with this being his fourth admission with volume overload. In June of this year he sustained a subdural hematoma and underwent evacuation..   Clinical Assessment and Goals of Care: Patient was started on IV Lasix after admission as well as milrinone. His weight has decreased by 8 pounds and he reports feeling better. Patient did go down for a PICC line placement today in anticipation of  Possibly home with milrinone. Patient shared with Dr. Jeffie Pollock that he wouldn't want intubation but was undecided about chest compressions and defibrillation (he does have an AICD), and he was made a partial code. Patient talks a lot about his home life and the stressors that he has there. He is requesting that his wife be present for further goals of care discussion  Patient at this point can participate in goals of care discussion, but in the event he is unable to speak for himself his spouse, Rick [name] would be his spokesperson. He has 3 children and his wife has 3 children. His son and daughter-in-law as well as their 46-year-old daughter  lives in the home with he and his wife. 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 He did not relapse on cocaine    SUMMARY OF RECOMMENDATIONS   Continue with milrinone and Lasix We'll need further goals of care with patient and his spouse to address whether he wants to go home on milrinone, continued activation of his AICD I am not sure that patient fully appreciates the severity of his heart disease. He does understand generally that he has "a weak heart", and I did address that milrinone was an intervention for end-stage heart disease His AICD has never fired so he does not have an appreciation of negative consequences associated with this device. This initial consults focus is on establishing a relationship, understanding patient's baseline understanding of his clinical condition, eliciting quality of life impression and 2 other significant others are in his life to involve in further decision making Code Status/Advance Care Planning:  Limited code    Symptom Management:   Dyspnea: Improved. A 10 year IV Lasix, milrinone for now. 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 In this case patient would qualify for in-home hospice (I did not discuss hospice at this initial meeting), but no hospice in our area will take a patient on milrinone so this will be a factor in going home with home health versus hospice support. He could go home with an outpatient palliative care follow-up in the home.  Discharge Planning: To Be  Determined      Primary Diagnoses: Present on Admission: . Acute on chronic combined systolic (congestive) and diastolic (congestive) heart failure ([place])   I have reviewed the medical record, interviewed the patient and family, and examined the patient. The following aspects are pertinent.  Past Medical History:  Diagnosis Date  . AICD (automatic cardioverter/defibrillator) present   . Anxiety   . CAD (coronary artery disease)   . CHF (congestive heart failure) ([place])   . DOE (dyspnea on exertion)   . Essential hypertension   . Family history of adverse reaction to anesthesia    "sister would go into seizures"  . Hyperlipidemia   . Ischemic cardiomyopathy    EF 20% by echo 2013  . Myocardial infarction acute ([place]) 2003  . Non-alcoholic cirrhosis ([place])   . Other pulmonary embolism and infarction    On coumadin  . Pulmonary hypertension ([facility])    56 mm Hg  . Sleep apnea    "tried machine a couple nights; couldn't do it" (11/03/2015)  . Stroke [facility])    left side facial droop since; don't know when it was" (11/03/2015)  . Tricuspid regurgitation    Severe   Social History   Social History  . Marital status: Married    Spouse name: N/A  . Number of children: 4  . Years of education: N/A   Occupational History  . Disabled.    Social History Main Topics  . Smoking status: Former Smoker    Packs/day: 0.50    Years: 38.00    Types: Cigarettes    Start date: 12/30/1956    Quit date: 06/06/1995  . Smokeless tobacco: Never Used  . Alcohol use 1.8 oz/week    3 Cans of beer per week     Comment: 11/03/2015 "Quit from [id number]; weaning myself off again"  . Drug use: No  . Sexual activity: Not Currently   Other Topics Concern  . None   Social History Narrative   Lives with wife and brother in [place] and grandson in [place].     Family History  Problem Relation Age of Onset  . Heart disease Mother     CAD  . Leukemia Father    Scheduled Meds: . ferrous sulfate   325 mg Oral Q breakfast  . furosemide  80 mg Intravenous BID  . lidocaine      . magnesium oxide  400 mg Oral Daily  . potassium chloride SA  40 mEq Oral BID  . sodium chloride flush  10-40 mL Intracatheter Q12H  . sodium chloride flush  3 mL Intravenous Q12H  . Warfarin - Pharmacist Dosing Inpatient   Does not apply q1800   Continuous Infusions: . milrinone 0.25 mcg/kg/min (02/04/16 0513)   PRN Meds:.sodium chloride, acetaminophen, lidocaine, ondansetron (ZOFRAN) IV, oxyCODONE, polyethylene glycol, sodium chloride flush, sodium chloride flush Medications Prior to Admission:  Prior to  Admission medications   Medication Sig Start Date End Date Taking? Authorizing Provider  ferrous sulfate 325 (65 FE) MG tablet Take 325 mg by mouth daily with breakfast. Reported on 12/16/2015   Yes Historical Provider, MD  magnesium oxide (MAG-OX) 400 MG tablet Take 1 tablet (400 mg total) by mouth daily. 01/27/16  Yes Amy D Clegg, NP  metolazone (ZAROXOLYN) 2.5 MG tablet Take 1 tablet (2.5 mg total) by mouth 2 (two) times a week. Every Wed and Sat 02/03/16  Yes Larey Dresser, MD  oxyCODONE (OXY IR/ROXICODONE) 5 MG immediate release tablet Take 1 tablet (5 mg total) by mouth every 4 (four) hours as needed for moderate pain. 11/20/15  Yes Newman Pies, MD  polyethylene glycol [facility] [place] [facility] Floria Raveling) packet Take 17 g by mouth daily as needed for mild constipation. Reported on 12/09/2015   Yes Historical Provider, MD  potassium chloride SA (K-DUR,KLOR-CON) 20 MEQ tablet Take 2 tablets (40 mEq total) by mouth 2 (two) times daily. Take an extra tab when you take metolazone 02/01/16  Yes Larey Dresser, MD  torsemide (DEMADEX) 20 MG tablet Take 4 tablets (80 mg total) by mouth 2 (two) times daily. 02/01/16  Yes Larey Dresser, MD  warfarin (COUMADIN) 3 MG tablet Take 3 mg by mouth daily.   Yes Historical Provider, MD   No Known Allergies Review of Systems  Constitutional: Positive for activity change and appetite  change.  HENT: Negative.   Eyes: Negative.   Respiratory: Positive for shortness of breath.   Cardiovascular: Positive for leg swelling.  Gastrointestinal: Negative.   Endocrine: Negative.   Genitourinary: Negative.   Musculoskeletal: Negative.   Allergic/Immunologic: Negative.   Neurological: Positive for weakness.  Hematological: Negative.   Psychiatric/Behavioral: The patient is nervous/anxious.     Physical Exam  Constitutional: He is oriented to person, place, and time. He appears well-developed.  HENT:  Head: Normocephalic and atraumatic.  Neck: Normal range of motion.  Pulmonary/Chest: Effort normal.  Musculoskeletal: Normal range of motion.  Neurological: He is alert and oriented to person, place, and time.  Skin: Skin is warm and dry.  Psychiatric: He has a normal mood and affect. His behavior is normal. Thought content normal.  Nursing note and vitals reviewed.   Vital Signs: BP (!) 95/48   Pulse 84   Temp 98.3 F (36.8 C)   Resp (!) 29   Ht 5\' 11"  (1.803 m)   Wt 78.5 kg (173 lb 1.6 oz)   SpO2 95%   BMI 24.14 kg/m  Pain Assessment: No/denies pain   Pain Score: 0-No pain   SpO2: SpO2: 95 % O2 Device:SpO2: 95 % O2 Flow Rate: .   IO: Intake/output summary:  Intake/Output Summary (Last 24 hours) at 02/04/16 1748 Last data filed at 02/04/16 1742  Gross per 24 hour  Intake          2148.05 ml  Output             6225 ml  Net         -4076.95 ml    LBM: Last BM Date: 01/29/16 Baseline Weight: Weight: 82.1 kg (181 lb) Most recent weight: Weight: 78.5 kg (173 lb 1.6 oz)     Palliative Assessment/Data:   Flowsheet Rows   Flowsheet Row Most Recent Value  Intake Tab  Referral Department  Hospitalist  Unit at Time of Referral  Cardiac/Telemetry Unit  Palliative Care Primary Diagnosis  Cardiac  Date Notified  02/03/16  Palliative Care Type  New Palliative care  Reason for referral  Clarify Goals of Care  Date of Admission  02/03/16  Date first seen  by Palliative Care  02/04/16  # of days Palliative referral response time  1 Day(s)  # of days IP prior to Palliative referral  0  Clinical Assessment  Palliative Performance Scale Score  40%  Pain Max last 24 hours  0  Pain Min Last 24 hours  0  Dyspnea Max Last 24 Hours  5  Dyspnea Min Last 24 hours  2  Nausea Max Last 24 Hours  0  Nausea Min Last 24 Hours  0  Anxiety Min Last 24 Hours  0  Other Max Last 24 Hours  0  Psychosocial & Spiritual Assessment  Palliative Care Outcomes  Patient/Family meeting held?  Yes  Who was at the meeting?  pt  Palliative Care follow-up planned  Yes, Facility      Time In: 1100 Time Out: 1215 Time Total: 75 min Greater than 50%  of this time was spent counseling and coordinating care related to the above assessment and plan. Attempted to reach Mrs. Balbuena by phone to schedule an appointment but was unable to reach her nor was there a means to LM. Will continue to try and arrange appt but in any event will FU with pt 02/05/16 for further [place] discussion  Signed by: Dory Horn, NP   Please contact Palliative Medicine Team phone at [phone] for questions and concerns.  For individual provider: See Shea Evans

## 2016-02-04 NOTE — Evaluation (Signed)
Occupational Therapy Evaluation Patient Details Name: Rick [name] MRN: [id number] DOB: Oct 30, 1945 Today's Date: 02/04/2016    History of Present Illness 70 y.o. male admitted for increased SOB and edema. PMH significant for CHF, CAD, AICD, dyspnea on exertion, HTN, HLD, ischemic cardiomyopathy, MI, non-alcoholic cirrhosis, hx of PE and infarction, pulmonary HTN, CVA (10/2015), subdural hematoma w/ burr hole procedure (11/2015).   Clinical Impression   PTA, pt was independent with ADLs and used quad cane for mobility but reports increasing difficulty with showering and IADLs. Pt required supervision for all ADL tasks and basic transfers for safety. Pt plans to d/c home with 24/7 assistance from various family members. Pt will benefit from continued acute OT to increase independence and safety with ADLs and mobility to allow for safe discharge home. No OT follow up or DME recommended at this time.    Follow Up Recommendations  No OT follow up;Supervision - Intermittent    Equipment Recommendations  None recommended by OT    Recommendations for Other Services       Precautions / Restrictions Precautions Precautions: Fall Restrictions Weight Bearing Restrictions: No      Mobility Bed Mobility               General bed mobility comments: Pt sitting EOB on OT arrival  Transfers Overall transfer level: Needs assistance Equipment used: Quad cane Transfers: Sit to/from Stand Sit to Stand: Supervision         General transfer comment: Supervision for safety. No LOB or safety concerns    Balance Overall balance assessment: Needs assistance;History of Falls Sitting-balance support: No upper extremity supported;Feet supported Sitting balance-Leahy Scale: Normal     Standing balance support: Single extremity supported;During functional activity Standing balance-Leahy Scale: Good                              ADL Overall ADL's : Needs assistance/impaired                                              Vision     Perception     Praxis      Pertinent Vitals/Pain Pain Assessment: No/denies pain     Hand Dominance Right   Extremity/Trunk Assessment Upper Extremity Assessment Upper Extremity Assessment: Overall WFL for tasks assessed   Lower Extremity Assessment Lower Extremity Assessment: Overall WFL for tasks assessed   Cervical / Trunk Assessment Cervical / Trunk Assessment: Normal   Communication Communication Communication: No difficulties   Cognition Arousal/Alertness: Awake/alert Behavior During Therapy: WFL for tasks assessed/performed Overall Cognitive Status: Within Functional Limits for tasks assessed                     General Comments       Exercises       Shoulder Instructions      Home Living Family/patient expects to be discharged to:: Private residence Living Arrangements: Spouse/significant other;Children;Other relatives (wife, brother-in-law, son, and multiple grandchildren) Available Help at Discharge: Family;Available 24 hours/day Type of Home: House Home Access: Level entry     Home Layout: One level     Bathroom Shower/Tub: Tub/shower unit Shower/tub characteristics: Architectural technologist: Standard     Home Equipment: Bedside commode;Shower seat;Cane - single point          Prior Functioning/Environment  Level of Independence: Independent with assistive device(s)        Comments: Uses SPC, reports increasing difficulty with showering and IADLs    OT Diagnosis: Other (comment) (Dyspnea)   OT Problem List: Decreased activity tolerance;Decreased safety awareness;Decreased knowledge of use of DME or AE;Cardiopulmonary status limiting activity;Impaired balance (sitting and/or standing)   OT Treatment/Interventions: Self-care/ADL training;Therapeutic exercise;Energy conservation;DME and/or AE instruction;Therapeutic activities;Patient/family  education;Balance training    OT Goals(Current goals can be found in the care plan section) Acute Rehab OT Goals Patient Stated Goal: to have less family drama at home OT Goal Formulation: With patient Time For Goal Achievement: 02/18/16 Potential to Achieve Goals: Good ADL Goals Pt Will Perform Upper Body Bathing: with modified independence;sitting;with adaptive equipment Pt Will Perform Lower Body Bathing: with modified independence;sit to/from stand;with adaptive equipment Pt Will Transfer to Toilet: with modified independence;ambulating;regular height toilet Pt Will Perform Toileting - Clothing Manipulation and hygiene: with modified independence;sit to/from stand Pt Will Perform Tub/Shower Transfer: Tub transfer;with modified independence;ambulating;shower seat Additional ADL Goal #1: Pt will verbalize and/or utilize 3 energy conservation strategies during ADL task to increase independence.  OT Frequency: Min 2X/week   Barriers to D/C:            Co-evaluation              End of Session Equipment Utilized During Treatment: Gait belt;Other (comment) (quad cane) Nurse Communication: Mobility status  Activity Tolerance: Patient tolerated treatment well Patient left: in chair;with call bell/phone within reach   Time: [id number] OT Time Calculation (min): 34 min Charges:  OT General Charges $OT Visit: 1 Procedure OT Evaluation $OT Eval Moderate Complexity: 1 Procedure OT Treatments $Self Care/Home Management : 8-22 mins G-Codes:    Redmond Baseman, OTR/L Pager: [phone] 02/04/2016, 10:21 AM

## 2016-02-04 NOTE — Progress Notes (Signed)
Advanced Heart Failure Rounding Note   Subjective:    Started milrinone and IV lasix yesterday. Fels much better. Breathing better. More alert. Weight down 8 pounds For PICC placement with IR today.    Objective:   Weight Range:  Vital Signs:   Temp:  [97.5 F (36.4 C)-98.9 F (37.2 C)] 98.6 F (37 C) (09/01 0300) Pulse Rate:  [74-83] 83 (09/01 0300) Resp:  [12-24] 19 (09/01 0724) BP: (87-140)/(57-87) 105/75 (09/01 0724) SpO2:  [99 %-100 %] 100 % (08/31 2300) Weight:  [78.5 kg (173 lb 1.6 oz)-82.1 kg (181 lb)] 78.5 kg (173 lb 1.6 oz) (09/01 0300) Last BM Date: 01/29/16 (patient states that he hasn't been taking his Myrelex)  Weight change: Filed Weights   02/03/16 1150 02/04/16 0300  Weight: 82.1 kg (181 lb) 78.5 kg (173 lb 1.6 oz)    Intake/Output:   Intake/Output Summary (Last 24 hours) at 02/04/16 0745 Last data filed at 02/04/16 0700  Gross per 24 hour  Intake          1130.71 ml  Output             6225 ml  Net         -5094.29 ml     Physical Exam: General: Lying in bed. NAD HEENT: normal Neck: supple. JVD 10 prominent cv waves. Carotids 2+ bilat; no bruits. No lymphadenopathy or thyromegaly appreciated. Cor: PMI nondisplaced. RRR. No rubs. 3/6 MR. No S3. Lungs: Diminished basilar sounds, with scant crackles Abdomen: soft, NT, ND, no HSM. No bruits or masses. +BS  Extremities: no cyanosis, clubbing, rash. 1+ edema Neuro: alert & oriented x 3, cranial nerves grossly intact. moves all 4 extremities w/o difficulty. Affect pleasant.   Telemetry: NSR  Labs: Basic Metabolic Panel:  Recent Labs Lab 02/01/16 1618 02/03/16 1210 02/04/16 0131  NA 134* 132* 133*  K 3.9 4.4 3.9  CL 103 100* 96*  CO2 24 25 25   GLUCOSE 134* 113* 86  BUN 28* 38* 46*  CREATININE 1.66* 1.71* 1.74*  CALCIUM 9.4 9.5 9.9  MG  --  2.2  --     Liver Function Tests:  Recent Labs Lab 02/03/16 1210  AST 27  ALT 16*  ALKPHOS 123  BILITOT 1.9*  PROT 7.8  ALBUMIN 3.5    No results for input(s): LIPASE, AMYLASE in the last 168 hours. No results for input(s): AMMONIA in the last 168 hours.  CBC:  Recent Labs Lab 02/03/16 1210  WBC 7.2  NEUTROABS 4.7  HGB 11.7*  HCT 37.6*  MCV 93.3  PLT 138*    Cardiac Enzymes:  Recent Labs Lab 02/03/16 1210 02/03/16 1701 02/04/16 0007  TROPONINI 0.03* <0.03 <0.03    BNP: BNP (last 3 results)  Recent Labs  01/18/16 1220 02/01/16 1619 02/03/16 1210  BNP 1,572.0* 1,585.7* 838.4*    ProBNP (last 3 results) No results for input(s): PROBNP in the last 8760 hours.    Other results:  Imaging: Dg Chest 2 View  Result Date: 02/04/2016 CLINICAL DATA:  Acute on chronic CHF, cardiomyopathy, coronary artery disease, pulmonary hypertension. EXAM: CHEST  2 VIEW COMPARISON:  Portable chest x-ray of February 03, 2016 FINDINGS: The right-sided central catheter has been removed. The lungs are well-expanded. There is no focal infiltrate. There is no pleural effusion or pneumothorax. The cardiac silhouette is top-normal in size. The permanent pacemaker defibrillator is in stable position. The sternal wires are intact. The bony thorax exhibits no acute abnormality. IMPRESSION: Top-normal cardiac  size without pulmonary edema. No evidence of pneumonia. Interval removal of the central venous catheter on the right. Electronically Signed   By: David  Martinique M.D.   On: 02/04/2016 07:13   Dg Chest Port 1 View  Result Date: 02/03/2016 CLINICAL DATA:  Title central catheter placement EXAM: PORTABLE CHEST 1 VIEW COMPARISON:  November 04, 2015 FINDINGS: Central catheter extends into the right neck region; its tip is not seen. No pneumothorax. Lungs are clear. There is cardiomegaly with pulmonary vascularity within normal limits. No adenopathy. Pacemaker leads are attached to the right atrium and right ventricle. IMPRESSION: Central catheter tip extends into the right jugular vein with tip not seen. No pneumothorax. No edema or  consolidation. Stable cardiomegaly. These results were called by telephone at the time of interpretation on 02/03/2016 at 3:31 pm to [facility], who verbally acknowledged these results. Electronically Signed   By: Lowella Grip III M.D.   On: 02/03/2016 15:31      Medications:     Scheduled Medications: . ferrous sulfate  325 mg Oral Q breakfast  . furosemide  80 mg Intravenous BID  . magnesium oxide  400 mg Oral Daily  . potassium chloride SA  40 mEq Oral BID  . sodium chloride flush  10-40 mL Intracatheter Q12H  . sodium chloride flush  3 mL Intravenous Q12H  . Warfarin - Pharmacist Dosing Inpatient   Does not apply q1800     Infusions: . milrinone 0.25 mcg/kg/min (02/04/16 0513)     PRN Medications:  sodium chloride, acetaminophen, ondansetron (ZOFRAN) IV, oxyCODONE, polyethylene glycol, sodium chloride flush, sodium chloride flush   Assessment:   1. 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 CAD s/p CABG 3. Hx of CVA - on coumadin 4. Hx of Subdural Hematoma s/p evacuation on 11/15/15. 5. CKD stage III-IV 6. OSA intolerant to CPAP.   Plan/Discussion:     Mr. Amirian has severe/end-stage biventricular HF with marked volume overload. Feeling much better with milrinone. Weight down 8 pounds. Renal function stable. Will have PICC line placed today. Continue milrinone and IV lasix. Will need to decide if he is candidate for home inotropes.   We have initiated [place] discussions with him. Will have Palliative Care team see.      Length of Stay: 1   Bensimhon, Daniel 02/04/2016, 7:45 AM  Advanced Heart Failure Team Pager [phone] (M-F; 7a - 4p)  Please contact [facility] for night-coverage after hours (4p -7a ) and weekends on [email or web address]

## 2016-02-04 NOTE — Progress Notes (Signed)
Patient off unit in wheelchair with iv pole and monitor and transport in stable condition.

## 2016-02-04 NOTE — Progress Notes (Addendum)
Notified from central - HR spiked into 140s, circa 1 minute, unsustained SVT. pt asymptomatic. Will save strip. Cardiology aware. Drawing [employer].

## 2016-02-04 NOTE — Progress Notes (Signed)
[place] 62% on milrinone 0.25 mcg/kg/min.  Continue. May need for home.    Legrand Como [address]" [place], PA-C 02/04/2016 1:27 PM

## 2016-02-04 NOTE — Evaluation (Signed)
Physical Therapy Evaluation Patient Details Name: Rick [name] MRN: [id number] DOB: 10-17-1945 Today's Date: 02/04/2016   History of Present Illness  70 y.o. male admitted for increased SOB and edema. PMH significant for CHF, CAD, AICD, dyspnea on exertion, HTN, HLD, ischemic cardiomyopathy, MI, non-alcoholic cirrhosis, hx of PE and infarction, pulmonary HTN, CVA (10/2015), subdural hematoma w/ burr hole procedure (11/2015).  Clinical Impression  Pt admitted with above diagnosis and presents to PT with functional limitations due to deficits listed below (See PT problem list). Pt needs skilled PT to maximize independence and safety to allow discharge to home. Will follow acutely to build endurance and improve activity efficiency in order to maximize heart function. Do not feel he will need PT after DC.     Follow Up Recommendations No PT follow up    Equipment Recommendations  None recommended by PT    Recommendations for Other Services       Precautions / Restrictions Precautions Precautions: Fall Restrictions Weight Bearing Restrictions: No      Mobility  Bed Mobility               General bed mobility comments: Pt in recliner  Transfers Overall transfer level: Modified independent Equipment used: Straight cane (stand alone tip) Transfers: Sit to/from Stand Sit to Stand: Modified independent (Device/Increase time)         General transfer comment: Supervision for safety. No LOB or safety concerns  Ambulation/Gait Ambulation/Gait assistance: Supervision Ambulation Distance (Feet): 350 Feet Assistive device: Straight cane (stand alone tip) Gait Pattern/deviations: Step-through pattern;Decreased stride length Gait velocity: decr Gait velocity interpretation: Below normal speed for age/gender General Gait Details: supervision for management of lines  Stairs            Wheelchair Mobility    Modified Rankin (Stroke Patients Only)       Balance  Overall balance assessment: Needs assistance Sitting-balance support: No upper extremity supported Sitting balance-Leahy Scale: Normal     Standing balance support: No upper extremity supported Standing balance-Leahy Scale: Good                               Pertinent Vitals/Pain Pain Assessment: No/denies pain    Home Living Family/patient expects to be discharged to:: Private residence Living Arrangements: Spouse/significant other;Children;Other relatives (wife, brother-in-law, son, and multiple grandchildren) Available Help at Discharge: Family;Available 24 hours/day Type of Home: House Home Access: Level entry     Home Layout: One level Home Equipment: Bedside commode;Shower seat;Cane - single point      Prior Function Level of Independence: Independent with assistive device(s)         Comments: Uses SPC, reports increasing difficulty with showering and IADLs     Hand Dominance   Dominant Hand: Right    Extremity/Trunk Assessment   Upper Extremity Assessment: Overall WFL for tasks assessed           Lower Extremity Assessment: Overall WFL for tasks assessed      Cervical / Trunk Assessment: Normal  Communication   Communication: No difficulties  Cognition Arousal/Alertness: Awake/alert Behavior During Therapy: WFL for tasks assessed/performed Overall Cognitive Status: Within Functional Limits for tasks assessed                      General Comments      Exercises        Assessment/Plan    PT Assessment Patient needs continued PT  services  PT Diagnosis Difficulty walking   PT Problem List Decreased activity tolerance;Decreased mobility;Decreased balance  PT Treatment Interventions Gait training;Balance training;Patient/family education   PT Goals (Current goals can be found in the Care Plan section) Acute Rehab PT Goals Patient Stated Goal: to have less family drama at home PT Goal Formulation: With patient Time For  Goal Achievement: 02/11/16 Potential to Achieve Goals: Good    Frequency Min 3X/week   Barriers to discharge        Co-evaluation               End of Session Equipment Utilized During Treatment: Gait belt Activity Tolerance: Patient tolerated treatment well Patient left: in chair;with call bell/phone within reach Nurse Communication: Mobility status         Time: [id number] PT Time Calculation (min) (ACUTE ONLY): 21 min   Charges:   PT Evaluation $PT Eval Low Complexity: 1 Procedure     PT G Codes:        [name] 29-Feb-2016, 11:56 AM Suanne Marker PT [phone]

## 2016-02-04 NOTE — Progress Notes (Signed)
Report received via Denice Paradise RN in patient's room using SBAR format, reviewed orders, labs, VS, meds and patient's general condition, assumed care of patient.

## 2016-02-05 DIAGNOSIS — I471 Supraventricular tachycardia: Secondary | ICD-10-CM

## 2016-02-05 LAB — CBC WITH DIFFERENTIAL/PLATELET
Basophils Absolute: 0 10*3/uL (ref 0.0–0.1)
Basophils Relative: 0 %
Eosinophils Absolute: 0.1 10*3/uL (ref 0.0–0.7)
Eosinophils Relative: 2 %
HEMATOCRIT: 39.5 % (ref 39.0–52.0)
HEMOGLOBIN: 12.3 g/dL — AB (ref 13.0–17.0)
LYMPHS ABS: 1.8 10*3/uL (ref 0.7–4.0)
LYMPHS PCT: 27 %
MCH: 28.6 pg (ref 26.0–34.0)
MCHC: 31.1 g/dL (ref 30.0–36.0)
MCV: 91.9 fL (ref 78.0–100.0)
MONOS PCT: 18 %
Monocytes Absolute: 1.2 10*3/uL — ABNORMAL HIGH (ref 0.1–1.0)
NEUTROS ABS: 3.5 10*3/uL (ref 1.7–7.7)
NEUTROS PCT: 53 %
Platelets: 154 10*3/uL (ref 150–400)
RBC: 4.3 MIL/uL (ref 4.22–5.81)
RDW: 15.8 % — ABNORMAL HIGH (ref 11.5–15.5)
WBC: 6.6 10*3/uL (ref 4.0–10.5)

## 2016-02-05 LAB — BASIC METABOLIC PANEL
Anion gap: 9 (ref 5–15)
BUN: 38 mg/dL — ABNORMAL HIGH (ref 6–20)
CO2: 28 mmol/L (ref 22–32)
Calcium: 9.5 mg/dL (ref 8.9–10.3)
Chloride: 95 mmol/L — ABNORMAL LOW (ref 101–111)
Creatinine, Ser: 1.56 mg/dL — ABNORMAL HIGH (ref 0.61–1.24)
GFR calc Af Amer: 50 mL/min — ABNORMAL LOW (ref >60)
GFR calc non Af Amer: 43 mL/min — ABNORMAL LOW (ref >60)
Glucose, Bld: 152 mg/dL — ABNORMAL HIGH (ref 65–99)
Potassium: 4 mmol/L (ref 3.5–5.1)
Sodium: 132 mmol/L — ABNORMAL LOW (ref 135–145)

## 2016-02-05 LAB — PROTIME-INR
INR: 1.42
Prothrombin Time: 17.4 seconds — ABNORMAL HIGH (ref 11.4–15.2)

## 2016-02-05 LAB — CARBOXYHEMOGLOBIN
Carboxyhemoglobin: 2.6 % — ABNORMAL HIGH (ref 0.5–1.5)
Methemoglobin: 0.6 % (ref 0.0–1.5)
O2 Saturation: 69.7 %
Total hemoglobin: 15.7 g/dL (ref 12.0–16.0)

## 2016-02-05 MED ORDER — WARFARIN SODIUM 2.5 MG PO TABS
2.5000 mg | ORAL_TABLET | Freq: Once | ORAL | Status: AC
  Administered 2016-02-05: 2.5 mg via ORAL
  Filled 2016-02-05: qty 1

## 2016-02-05 MED ORDER — AMIODARONE HCL 200 MG PO TABS
200.0000 mg | ORAL_TABLET | Freq: Two times a day (BID) | ORAL | Status: DC
  Administered 2016-02-05 – 2016-02-08 (×7): 200 mg via ORAL
  Filled 2016-02-05 (×7): qty 1

## 2016-02-05 NOTE — Progress Notes (Signed)
Updated report given via Denice Paradise RN in patient's room using SBAR format, VS, new orders, test results reviewed along with POC, assumed care of patient.

## 2016-02-05 NOTE — Progress Notes (Signed)
Advanced Heart Failure Rounding Note   Subjective:    Started milrinone and IV lasix 8/31.   He has diuresed another 8 pounds. 16 pounds total. Breathing better. This am had 2-hour episode of SVT with rates 130-140s. Now back in NSR.    Fels much better. Breathing better. More alert. No orthopnea or PND. Co-ox 70%  K 4.0 mg 2.7  Occasionally confused at times. All twisted in his IV lines and wires.    Objective:   Weight Range:  Vital Signs:   Temp:  [98 F (36.7 C)-98.3 F (36.8 C)] 98 F (36.7 C) (09/02 0525) Pulse Rate:  [76-117] 117 (09/02 0625) Resp:  [11-29] 20 (09/02 0625) BP: (81-124)/(48-90) 103/77 (09/02 0625) SpO2:  [93 %-100 %] 99 % (09/02 0625) Weight:  [75.1 kg (165 lb 8 oz)] 75.1 kg (165 lb 8 oz) (09/02 0525) Last BM Date: 01/29/16  Weight change: Filed Weights   02/03/16 1150 02/04/16 0300 02/05/16 0525  Weight: 82.1 kg (181 lb) 78.5 kg (173 lb 1.6 oz) 75.1 kg (165 lb 8 oz)    Intake/Output:   Intake/Output Summary (Last 24 hours) at 02/05/16 0757 Last data filed at 02/05/16 0600  Gross per 24 hour  Intake          1929.23 ml  Output             4750 ml  Net         -2820.77 ml     Physical Exam: General: Lying in bed. NAD HEENT: normal Neck: supple. JVD 10 prominent cv waves. Carotids 2+ bilat; no bruits. No lymphadenopathy or thyromegaly appreciated. Cor: PMI nondisplaced. RRR. No rubs. 3/6 MR. No S3. Lungs: Diminished basilar sounds, with scant crackles Abdomen: soft, NT, ND, no HSM. No bruits or masses. +BS  Extremities: no cyanosis, clubbing, rash. Trace- 1+ edema Neuro: alert & oriented x 3, cranial nerves grossly intact. moves all 4 extremities w/o difficulty. Affect pleasant.   Telemetry: NSR with 2 hour run SVT 130-140. +PVCs  Labs: Basic Metabolic Panel:  Recent Labs Lab 02/01/16 1618 02/03/16 1210 02/04/16 0131 02/04/16 1525 02/05/16 0449  NA 134* 132* 133*  --  132*  K 3.9 4.4 3.9  --  4.0  CL 103 100* 96*  --   95*  CO2 24 25 25   --  28  GLUCOSE 134* 113* 86  --  152*  BUN 28* 38* 46*  --  38*  CREATININE 1.66* 1.71* 1.74*  --  1.56*  CALCIUM 9.4 9.5 9.9  --  9.5  MG  --  2.2  --  2.7*  --     Liver Function Tests:  Recent Labs Lab 02/03/16 1210  AST 27  ALT 16*  ALKPHOS 123  BILITOT 1.9*  PROT 7.8  ALBUMIN 3.5   No results for input(s): LIPASE, AMYLASE in the last 168 hours. No results for input(s): AMMONIA in the last 168 hours.  CBC:  Recent Labs Lab 02/03/16 1210 02/05/16 0449  WBC 7.2 6.6  NEUTROABS 4.7 3.5  HGB 11.7* 12.3*  HCT 37.6* 39.5  MCV 93.3 91.9  PLT 138* 154    Cardiac Enzymes:  Recent Labs Lab 02/03/16 1210 02/03/16 1701 02/04/16 0007  TROPONINI 0.03* <0.03 <0.03    BNP: BNP (last 3 results)  Recent Labs  01/18/16 1220 02/01/16 1619 02/03/16 1210  BNP 1,572.0* 1,585.7* 838.4*    ProBNP (last 3 results) No results for input(s): PROBNP in the last 8760 hours.  Other results:  Imaging: Dg Chest 2 View  Result Date: 02/04/2016 CLINICAL DATA:  Acute on chronic CHF, cardiomyopathy, coronary artery disease, pulmonary hypertension. EXAM: CHEST  2 VIEW COMPARISON:  Portable chest x-ray of February 03, 2016 FINDINGS: The right-sided central catheter has been removed. The lungs are well-expanded. There is no focal infiltrate. There is no pleural effusion or pneumothorax. The cardiac silhouette is top-normal in size. The permanent pacemaker defibrillator is in stable position. The sternal wires are intact. The bony thorax exhibits no acute abnormality. IMPRESSION: Top-normal cardiac size without pulmonary edema. No evidence of pneumonia. Interval removal of the central venous catheter on the right. Electronically Signed   By: David  Martinique M.D.   On: 02/04/2016 07:13   Ir Fluoro Guide Cv Line Right  Result Date: 02/04/2016 CLINICAL DATA:  Needs durable access for long-term IV milrinone EXAM: PICC PLACEMENT WITH ULTRASOUND AND FLUOROSCOPY FLUOROSCOPY  TIME:  12 seconds, 4 mGy TECHNIQUE: After written informed consent was obtained, patient was placed in the supine position on angiographic table. Patency of the right basilic vein was confirmed with ultrasound with image documentation. An appropriate skin site was determined. Skin site was marked. Region was prepped using maximum barrier technique including cap and mask, sterile gown, sterile gloves, large sterile sheet, and Chlorhexidine as cutaneous antisepsis. The region was infiltrated locally with 1% lidocaine. Under real-time ultrasound guidance, the right basilic vein was accessed with a 21 gauge micropuncture needle; the needle tip within the vein was confirmed with ultrasound image documentation. Needle exchanged over a 018 guidewire for a peel-away sheath, through which a 5-French single-lumen power injectable PICC trimmed to 43cm was advanced, positioned with its tip near the cavoatrial junction. Spot chest radiograph confirms appropriate catheter position. Catheter was flushed per protocol and secured externally. The patient tolerated procedure well. COMPLICATIONS: COMPLICATIONS none IMPRESSION: 1. Technically successful five [place] single lumen power injectable PICC placement Electronically Signed   By: Lucrezia Europe M.D.   On: 02/04/2016 13:21   Ir US Guide Vasc Access Right  Result Date: 02/04/2016 CLINICAL DATA:  Needs durable access for long-term IV milrinone EXAM: PICC PLACEMENT WITH ULTRASOUND AND FLUOROSCOPY FLUOROSCOPY TIME:  12 seconds, 4 mGy TECHNIQUE: After written informed consent was obtained, patient was placed in the supine position on angiographic table. Patency of the right basilic vein was confirmed with ultrasound with image documentation. An appropriate skin site was determined. Skin site was marked. Region was prepped using maximum barrier technique including cap and mask, sterile gown, sterile gloves, large sterile sheet, and Chlorhexidine as cutaneous antisepsis. The region was  infiltrated locally with 1% lidocaine. Under real-time ultrasound guidance, the right basilic vein was accessed with a 21 gauge micropuncture needle; the needle tip within the vein was confirmed with ultrasound image documentation. Needle exchanged over a 018 guidewire for a peel-away sheath, through which a 5-French single-lumen power injectable PICC trimmed to 43cm was advanced, positioned with its tip near the cavoatrial junction. Spot chest radiograph confirms appropriate catheter position. Catheter was flushed per protocol and secured externally. The patient tolerated procedure well. COMPLICATIONS: COMPLICATIONS none IMPRESSION: 1. Technically successful five [place] single lumen power injectable PICC placement Electronically Signed   By: Lucrezia Europe M.D.   On: 02/04/2016 13:21   Dg Chest Port 1 View  Result Date: 02/03/2016 CLINICAL DATA:  Title central catheter placement EXAM: PORTABLE CHEST 1 VIEW COMPARISON:  November 04, 2015 FINDINGS: Central catheter extends into the right neck region; its tip is not seen. No  pneumothorax. Lungs are clear. There is cardiomegaly with pulmonary vascularity within normal limits. No adenopathy. Pacemaker leads are attached to the right atrium and right ventricle. IMPRESSION: Central catheter tip extends into the right jugular vein with tip not seen. No pneumothorax. No edema or consolidation. Stable cardiomegaly. These results were called by telephone at the time of interpretation on 02/03/2016 at 3:31 pm to [facility], who verbally acknowledged these results. Electronically Signed   By: Lowella Grip III M.D.   On: 02/03/2016 15:31     Medications:     Scheduled Medications: . ferrous sulfate  325 mg Oral Q breakfast  . furosemide  80 mg Intravenous BID  . magnesium oxide  400 mg Oral Daily  . potassium chloride SA  40 mEq Oral BID  . sodium chloride flush  10-40 mL Intracatheter Q12H  . sodium chloride flush  3 mL Intravenous Q12H  . Warfarin -  Pharmacist Dosing Inpatient   Does not apply q1800    Infusions: . milrinone 0.25 mcg/kg/min (02/04/16 2201)    PRN Medications: sodium chloride, acetaminophen, lidocaine, ondansetron (ZOFRAN) IV, oxyCODONE, polyethylene glycol, sodium chloride flush, sodium chloride flush   Assessment:   1. 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 CAD s/p CABG 3. Hx of CVA - on coumadin 4. Hx of Subdural Hematoma s/p evacuation on 11/15/15. 5. CKD stage III-IV 6. OSA intolerant to CPAP.  7. PSVT 8. ETOH abuse (quit in July)  Plan/Discussion:     Mr. Messler has severe/end-stage biventricular HF with marked volume overload. Feeling much better with milrinone and IV diuresis. Weight down 16 pounds. Renal function stable. Baseline weight closer to 146-150. Continue IV diuresis.  Last night with 2-hour run SVT. Will start po amio. Keep K >= 4.0 and Mag >=2.0  He has met with Palliative Care team and begun to discuss [place].   We will continue milrinone today and then see if we can wean slowly as he is not ideal candidate for home inotropes.   Length of Stay: 2   Glori Bickers MD 02/05/2016, 7:57 AM  Advanced Heart Failure Team Pager [phone] (M-F; [place])  Please contact [facility] for night-coverage after hours (4p -7a ) and weekends on [email or web address]

## 2016-02-05 NOTE — Plan of Care (Signed)
Problem: Education: Goal: Ability to demonstrate managment of disease process will improve Outcome: Progressing Reviewed foods high in sodium and how to read labels to determine the amount of sodium that he will ingest per day, also reviewed foods high in potassium, magnesium and calcium to help replace what he loses with his diuresis, will continue to instruct and monitor progress.

## 2016-02-05 NOTE — Progress Notes (Signed)
Patient had a 3 beat run of V-Tach with this AM assessment but was asymptomatic then shortly after went into an SVT 120-140's that has been sustained but patient's only complaint is gas pain and hunger pains, VSS, will get an EKG and page Cards, awaiting AM labs that were just sent and will continue to monitor

## 2016-02-05 NOTE — Plan of Care (Signed)
Problem: Education: Goal: Knowledge of [place] [facility] Education information/materials will improve Outcome: Progressing Instructed and reinforced safety and pain issues, reviewed and instructed on disease process and some tips on low sodium foods and also instructed on importance of watching po intake to keep from creating a viscous circle of fluid overload and reviewed what Furosemide is and what it's being used for. Patient given written info and asked questions to make sure he understands what he's being taught, will continue to monitor and instruct as needed.

## 2016-02-05 NOTE — Progress Notes (Signed)
Cards fellow up to see patient and looked at the EKG then went into see the patient, no orders given and all VS and labwork within normal ranges for him, will continuie to monitor.

## 2016-02-05 NOTE — Progress Notes (Signed)
Patient still in an SVT rate 140-150 and has gone into trigeminy, made MD aware, he remains asymptomatic with VSS and CVP 4, will continue to monitor.

## 2016-02-05 NOTE — Progress Notes (Signed)
ANTICOAGULATION CONSULT NOTE - Initial Consult  Pharmacy Consult for warfarin Indication: pulmonary embolus, CVA  No Known Allergies  Patient Measurements: Height: 5\' 11"  (180.3 cm) Weight: 165 lb 8 oz (75.1 kg) IBW/kg (Calculated) : 75.3  Vital Signs: Temp: 97.9 F (36.6 C) (09/02 1137) BP: 103/77 (09/02 0625) Pulse Rate: 117 (09/02 0625)  Labs:  Recent Labs  02/03/16 1210 02/03/16 1701 02/04/16 0007 02/04/16 0131 02/05/16 0449  HGB 11.7*  --   --   --  12.3*  HCT 37.6*  --   --   --  39.5  PLT 138*  --   --   --  154  APTT 39*  --   --   --   --   LABPROT 17.3*  --   --  16.7* 17.4*  INR 1.40  --   --  1.34 1.42  CREATININE 1.71*  --   --  1.74* 1.56*  TROPONINI 0.03* <0.03 <0.03  --   --     Estimated Creatinine Clearance: 46.8 mL/min (by C-G formula based on SCr of 1.56 mg/dL).  Assessment: 70 yo male w/PMH of Systolic HF, [employer] ICD,  HTN, PE and CVA admitted with worsening CHF sx. Patient was direct admit for IV diuresis and evaluation of cardiac output. Last admitted 6/10-6/17 for subdural hematoma and HF.  Pharmacy consulted for warfarin management. PTA warfarin 3mg  daily for Hx of PE and questionable stroke.   INR 1.42, today, still subtherapeutic but trending up. Now on amiodarone d/t two hours in SVT. Will decrease tonight's warfarin dose by 50% to accommodate for inhibition secondary to amiodarone. No s/sx of bleeding noted. Plts and Hgb trending up.   Goal of Therapy:  INR 2-3 Monitor platelets by anticoagulation protocol: Yes   Plan:  Will give Warfarin 2.5 mg tonight in setting of amiodarone F/u INR with am Labs Monitor S/Sx of bleeding  Dierdre Harness, [place], PharmD Clinical Pharmacy Resident [phone] (Pager) 02/05/2016 12:14 PM

## 2016-02-05 NOTE — Progress Notes (Signed)
Spoke to patient's wife on the telephone and we arranged a time to meet on 02/06/2016 at 4 PM to review her cousin's clinical status, goals of care, and what to expect going forward with end-stage congestive heart failure. Thank you, Romona Curls, ANP-ACHPN

## 2016-02-06 DIAGNOSIS — N183 Chronic kidney disease, stage 3 unspecified: Secondary | ICD-10-CM

## 2016-02-06 DIAGNOSIS — I471 Supraventricular tachycardia: Secondary | ICD-10-CM

## 2016-02-06 LAB — BASIC METABOLIC PANEL
Anion gap: 9 (ref 5–15)
BUN: 46 mg/dL — AB (ref 6–20)
CHLORIDE: 96 mmol/L — AB (ref 101–111)
CO2: 26 mmol/L (ref 22–32)
CREATININE: 1.58 mg/dL — AB (ref 0.61–1.24)
Calcium: 9.3 mg/dL (ref 8.9–10.3)
GFR calc non Af Amer: 43 mL/min — ABNORMAL LOW (ref >60)
GFR, EST AFRICAN AMERICAN: 49 mL/min — AB (ref >60)
Glucose, Bld: 109 mg/dL — ABNORMAL HIGH (ref 65–99)
Potassium: 4.4 mmol/L (ref 3.5–5.1)
Sodium: 131 mmol/L — ABNORMAL LOW (ref 135–145)

## 2016-02-06 LAB — PROTIME-INR
INR: 1.31
PROTHROMBIN TIME: 16.4 s — AB (ref 11.4–15.2)

## 2016-02-06 LAB — CARBOXYHEMOGLOBIN
Carboxyhemoglobin: 2.6 % — ABNORMAL HIGH (ref 0.5–1.5)
Methemoglobin: 0.8 % (ref 0.0–1.5)
O2 SAT: 68.2 %
Total hemoglobin: 12.6 g/dL (ref 12.0–16.0)

## 2016-02-06 MED ORDER — PREDNISONE 20 MG PO TABS
40.0000 mg | ORAL_TABLET | Freq: Every day | ORAL | Status: AC
  Administered 2016-02-06 – 2016-02-07 (×2): 40 mg via ORAL
  Filled 2016-02-06 (×2): qty 2

## 2016-02-06 MED ORDER — ALPRAZOLAM 0.25 MG PO TABS
0.2500 mg | ORAL_TABLET | Freq: Once | ORAL | Status: AC
  Administered 2016-02-06: 0.25 mg via ORAL
  Filled 2016-02-06: qty 1

## 2016-02-06 MED ORDER — WARFARIN SODIUM 5 MG PO TABS
5.0000 mg | ORAL_TABLET | Freq: Once | ORAL | Status: AC
  Administered 2016-02-06: 5 mg via ORAL
  Filled 2016-02-06: qty 1

## 2016-02-06 NOTE — Progress Notes (Signed)
Advanced Heart Failure Rounding Note   Subjective:    Started milrinone and IV lasix 8/31. Had 2-hour run of SVT on 9/3  Negative 1.5 L yesterday but weight recorded as up 5 pounds.  Now on amio for SVT. No recurrent SVT  Feels a little weak today. SBP 80-90. + gout pain in left big toe. Denies dyspnea. Confused at times.  Co-ox 68%  K 4.0 mg 2.7     Objective:   Weight Range:  Vital Signs:   Temp:  [97.4 F (36.3 C)-98.8 F (37.1 C)] 97.8 F (36.6 C) (09/03 0733) Pulse Rate:  [81-90] 90 (09/03 0733) Resp:  [10-26] 20 (09/03 0733) BP: (80-126)/(49-81) 108/79 (09/03 0733) SpO2:  [97 %-99 %] 99 % (09/03 0733) Weight:  [77.2 kg (170 lb 1.6 oz)] 77.2 kg (170 lb 1.6 oz) (09/03 0600) Last BM Date: 02/04/16  Weight change: Filed Weights   02/04/16 0300 02/05/16 0525 02/06/16 0600  Weight: 78.5 kg (173 lb 1.6 oz) 75.1 kg (165 lb 8 oz) 77.2 kg (170 lb 1.6 oz)    Intake/Output:   Intake/Output Summary (Last 24 hours) at 02/06/16 0800 Last data filed at 02/06/16 0600  Gross per 24 hour  Intake            668.2 ml  Output             2150 ml  Net          -1481.8 ml     Physical Exam: General: Lying in bed. NAD HEENT: normal Neck: supple. JVD 9 prominent cv waves. Carotids 2+ bilat; no bruits. No lymphadenopathy or thyromegaly appreciated. Cor: PMI nondisplaced. RRR. No rubs. 3/6 MR. No S3. Lungs: Diminished basilar sounds, with scant crackles Abdomen: soft, NT, ND, no HSM. No bruits or masses. +BS  Extremities: no cyanosis, clubbing, rash. Trace  Edema L big toe tender to touch  Neuro: alert & oriented x 3, cranial nerves grossly intact. moves all 4 extremities w/o difficulty. Affect pleasant.   Telemetry: NSR with 80s Frequent PVCs  Labs: Basic Metabolic Panel:  Recent Labs Lab 02/01/16 1618 02/03/16 1210 02/04/16 0131 02/04/16 1525 02/05/16 0449 02/06/16 0510  NA 134* 132* 133*  --  132* 131*  K 3.9 4.4 3.9  --  4.0 4.4  CL 103 100* 96*  --  95*  96*  CO2 _0 --  28 26  GLUCOSE 134* 113* 86  --  152* 109*  BUN 28* 38* 46*  --  38* 46*  CREATININE 1.66* 1.71* 1.74*  --  1.56* 1.58*  CALCIUM 9.4 9.5 9.9  --  9.5 9.3  MG  --  2.2  --  2.7*  --   --     Liver Function Tests:  Recent Labs Lab 02/03/16 1210  AST 27  ALT 16*  ALKPHOS 123  BILITOT 1.9*  PROT 7.8  ALBUMIN 3.5   No results for input(s): LIPASE, AMYLASE in the last 168 hours. No results for input(s): AMMONIA in the last 168 hours.  CBC:  Recent Labs Lab 02/03/16 1210 02/05/16 0449  WBC 7.2 6.6  NEUTROABS 4.7 3.5  HGB 11.7* 12.3*  HCT 37.6* 39.5  MCV 93.3 91.9  PLT 138* 154    Cardiac Enzymes:  Recent Labs Lab 02/03/16 1210 02/03/16 1701 02/04/16 0007  TROPONINI 0.03* <0.03 <0.03    BNP: BNP (last 3 results)  Recent Labs  01/18/16 1220 02/01/16 1619 02/03/16 1210  BNP 1,572.0* 1,585.7*  838.4*    ProBNP (last 3 results) No results for input(s): PROBNP in the last 8760 hours.    Other results:  Imaging: Ir Fluoro Guide Cv Line Right  Result Date: 02/04/2016 CLINICAL DATA:  Needs durable access for long-term IV milrinone EXAM: PICC PLACEMENT WITH ULTRASOUND AND FLUOROSCOPY FLUOROSCOPY TIME:  12 seconds, 4 mGy TECHNIQUE: After written informed consent was obtained, patient was placed in the supine position on angiographic table. Patency of the right basilic vein was confirmed with ultrasound with image documentation. An appropriate skin site was determined. Skin site was marked. Region was prepped using maximum barrier technique including cap and mask, sterile gown, sterile gloves, large sterile sheet, and Chlorhexidine as cutaneous antisepsis. The region was infiltrated locally with 1% lidocaine. Under real-time ultrasound guidance, the right basilic vein was accessed with a 21 gauge micropuncture needle; the needle tip within the vein was confirmed with ultrasound image documentation. Needle exchanged over a 018 guidewire for a  peel-away sheath, through which a 5-French single-lumen power injectable PICC trimmed to 43cm was advanced, positioned with its tip near the cavoatrial junction. Spot chest radiograph confirms appropriate catheter position. Catheter was flushed per protocol and secured externally. The patient tolerated procedure well. COMPLICATIONS: COMPLICATIONS none IMPRESSION: 1. Technically successful five [place] single lumen power injectable PICC placement Electronically Signed   By: Lucrezia Europe M.D.   On: 02/04/2016 13:21   Ir US Guide Vasc Access Right  Result Date: 02/04/2016 CLINICAL DATA:  Needs durable access for long-term IV milrinone EXAM: PICC PLACEMENT WITH ULTRASOUND AND FLUOROSCOPY FLUOROSCOPY TIME:  12 seconds, 4 mGy TECHNIQUE: After written informed consent was obtained, patient was placed in the supine position on angiographic table. Patency of the right basilic vein was confirmed with ultrasound with image documentation. An appropriate skin site was determined. Skin site was marked. Region was prepped using maximum barrier technique including cap and mask, sterile gown, sterile gloves, large sterile sheet, and Chlorhexidine as cutaneous antisepsis. The region was infiltrated locally with 1% lidocaine. Under real-time ultrasound guidance, the right basilic vein was accessed with a 21 gauge micropuncture needle; the needle tip within the vein was confirmed with ultrasound image documentation. Needle exchanged over a 018 guidewire for a peel-away sheath, through which a 5-French single-lumen power injectable PICC trimmed to 43cm was advanced, positioned with its tip near the cavoatrial junction. Spot chest radiograph confirms appropriate catheter position. Catheter was flushed per protocol and secured externally. The patient tolerated procedure well. COMPLICATIONS: COMPLICATIONS none IMPRESSION: 1. Technically successful five [place] single lumen power injectable PICC placement Electronically Signed   By: Lucrezia Europe M.D.   On: 02/04/2016 13:21     Medications:     Scheduled Medications: . amiodarone  200 mg Oral BID  . ferrous sulfate  325 mg Oral Q breakfast  . furosemide  80 mg Intravenous BID  . magnesium oxide  400 mg Oral Daily  . potassium chloride SA  40 mEq Oral BID  . sodium chloride flush  10-40 mL Intracatheter Q12H  . sodium chloride flush  3 mL Intravenous Q12H  . Warfarin - Pharmacist Dosing Inpatient   Does not apply q1800    Infusions: . milrinone 0.25 mcg/kg/min (02/05/16 1516)    PRN Medications: sodium chloride, acetaminophen, lidocaine, ondansetron (ZOFRAN) IV, oxyCODONE, polyethylene glycol, sodium chloride flush, sodium chloride flush   Assessment:   1. 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 CAD s/p CABG  3. Hx of CVA - on coumadin 4. Hx of Subdural Hematoma s/p evacuation on 11/15/15. 5. CKD stage III-IV 6. OSA intolerant to CPAP.  7. PSVT 8. ETOH abuse (quit in July) 9. Acute gout  Plan/Discussion:     Mr. Haynesworth has severe/end-stage biventricular HF with marked volume overload. Feeling much better with milrinone and IV diuresis. I/Os negative but weight inaccurate. Renal function stable. BP now down. Will stop IV lasix today. Switch to po torsemide in am.   No further SVT. Continue po amio. Keep K >= 4.0 and Mag >=2.0  Start prednisone for gout.   He has met with Palliative Care team and begun to discuss [place]. Have family meeting today.   Will drop milrinone to 0.125 today and then see if we can wean off as he is not ideal candidate for home inotropes.   Length of Stay: 3   Bensimhon, Daniel MD 02/06/2016, 8:00 AM  Advanced Heart Failure Team Pager [phone] (M-F; 7a - 4p)  Please contact [facility] for night-coverage after hours (4p -7a ) and weekends on [email or web address]

## 2016-02-06 NOTE — Progress Notes (Signed)
Report received in patient's room via RN using SBAR format, updated on VS, new orders and events of the day, assumed care of patient.

## 2016-02-06 NOTE — Progress Notes (Signed)
ANTICOAGULATION CONSULT NOTE - Follow Up Consult  Pharmacy Consult for warfarin Indication: pulmonary embolus, CVA  No Known Allergies  Patient Measurements: Height: 5\' 11"  (180.3 cm) Weight: 170 lb 1.6 oz (77.2 kg) IBW/kg (Calculated) : 75.3  Vital Signs: Temp: 97.8 F (36.6 C) (09/03 0733) Temp Source: Oral (09/03 0733) BP: 108/79 (09/03 0733) Pulse Rate: 90 (09/03 0733)  Labs:  Recent Labs  02/03/16 1210 02/03/16 1701 02/04/16 0007 02/04/16 0131 02/05/16 0449 02/06/16 0510  HGB 11.7*  --   --   --  12.3*  --   HCT 37.6*  --   --   --  39.5  --   PLT 138*  --   --   --  154  --   APTT 39*  --   --   --   --   --   LABPROT 17.3*  --   --  16.7* 17.4* 16.4*  INR 1.40  --   --  1.34 1.42 1.31  CREATININE 1.71*  --   --  1.74* 1.56* 1.58*  TROPONINI 0.03* <0.03 <0.03  --   --   --     Estimated Creatinine Clearance: 46.3 mL/min (by C-G formula based on SCr of 1.58 mg/dL).  Assessment: 70 yo male w/PMH of Systolic HF, [employer] ICD,  HTN, PE and CVA admitted with worsening CHF sx. Patient was direct admit for IV diuresis and evaluation of cardiac output. Last admitted 6/10-6/17 for subdural hematoma and HF.  Pharmacy consulted for warfarin management. PTA warfarin 3mg  daily for Hx of PE and questionable stroke.   INR 1.31, down from yesterday. Amio can increase warfarin levels. Will increase tonight's dose to accommodate for todays drop in INR. No s/sx of bleeding noted.  Plts and Hgb stable.     Goal of Therapy:  INR 2-3 Monitor platelets by anticoagulation protocol: Yes   Plan:  Will give Warfarin 5 mg po x1 INR with am Labs Monitor S/Sx of bleeding  Dierdre Harness, Cain Sieve, PharmD Clinical Pharmacy Resident [phone] (Pager) 02/06/2016 11:42 AM

## 2016-02-06 NOTE — Progress Notes (Signed)
Daily Progress Note   Patient Name: Rick [name]       Date: 02/06/2016 DOB: 1945/07/17  Age: 70 y.o. MRN#: [id number] Attending Physician: Jolaine Artist, MD Primary Care Physician: Wende Neighbors, MD Admit Date: 02/03/2016  Reason for Consultation/Follow-up: Establishing goals of care and Psychosocial/spiritual support  Subjective: Chart reviewed. Patient had runs of SVT and now on amiodarone. His weight is up 5 pounds. He is intermittently confused but is still alert and oriented to basics such as person place and situation. His insight is poor into how serious his health is. Very prominent JVD. His blood pressure decreased at one point and his Lasix has been stopped. Per chart review all weaning of milrinone has begun.  Met with patient and his wife today to further goals of care discussion. I informed patient and his wife that it was my opinion according to Medicare guidelines that he likely would qualify for his hospice benefit which means if things were to continue to go the way they are now he likely would have less than 6 months to live that is barring an acute event which she is at high risk for. Wife was able to verbalize very clearly that she understands that process and what it could look like and that she would choose DO NOT RESUSCITATE status for herself. When he asks her questions such as "what do you think", she states those things are up to you. It is difficult for patient to come to grips with end-stage disease  at this point not only because he feels better today but also I'm not clear whether he truly has the capacity to appreciate the severity of his illness at this point. He was  unable to express the  pros and cons of CPR defibrillation and intubation, or deactivating or leaving  active his AI CD.  Length of Stay: 3  Current Medications: Scheduled Meds:  . amiodarone  200 mg Oral BID  . ferrous sulfate  325 mg Oral Q breakfast  . magnesium oxide  400 mg Oral Daily  . predniSONE  40 mg Oral Q breakfast  . sodium chloride flush  10-40 mL Intracatheter Q12H  . sodium chloride flush  3 mL Intravenous Q12H  . warfarin  5 mg Oral ONCE-1800  . Warfarin - Pharmacist Dosing Inpatient  Does not apply q1800    Continuous Infusions: . milrinone 0.25 mcg/kg/min (02/06/16 1044)    PRN Meds: sodium chloride, acetaminophen, lidocaine, ondansetron (ZOFRAN) IV, oxyCODONE, polyethylene glycol, sodium chloride flush, sodium chloride flush  Physical Exam  Constitutional: He appears well-developed.  HENT:  Head: Normocephalic and atraumatic.  Neck: Normal range of motion.  Pulmonary/Chest:  Mild increased work of breathing  Musculoskeletal: Normal range of motion.  Neurological:  Short term memory deficits noted. Intermittent confusion  Skin: Skin is warm and dry.  Nursing note and vitals reviewed.           Vital Signs: BP 103/65   Pulse 82   Temp 98 F (36.7 C) (Oral)   Resp 17   Ht 5' 11"  (1.803 m)   Wt 77.2 kg (170 lb 1.6 oz)   SpO2 99%   BMI 23.72 kg/m  SpO2: SpO2: 99 % O2 Device: O2 Device: Not Delivered O2 Flow Rate:    Intake/output summary:  Intake/Output Summary (Last 24 hours) at 02/06/16 1513 Last data filed at 02/06/16 0600  Gross per 24 hour  Intake            612.4 ml  Output             1800 ml  Net          -1187.6 ml   LBM: Last BM Date: 02/04/16 Baseline Weight: Weight: 82.1 kg (181 lb) Most recent weight: Weight: 77.2 kg (170 lb 1.6 oz)       Palliative Assessment/Data:    Flowsheet Rows   Flowsheet Row Most Recent Value  Intake Tab  Referral Department  Hospitalist  Unit at Time of Referral  Cardiac/Telemetry Unit  Palliative Care Primary Diagnosis  Cardiac  Date Notified  02/03/16  Palliative Care Type  New  Palliative care  Reason for referral  Clarify Goals of Care  Date of Admission  02/03/16  Date first seen by Palliative Care  02/04/16  # of days Palliative referral response time  1 Day(s)  # of days IP prior to Palliative referral  0  Clinical Assessment  Palliative Performance Scale Score  40%  Pain Max last 24 hours  0  Pain Min Last 24 hours  0  Dyspnea Max Last 24 Hours  5  Dyspnea Min Last 24 hours  2  Nausea Max Last 24 Hours  0  Nausea Min Last 24 Hours  0  Anxiety Min Last 24 Hours  0  Other Max Last 24 Hours  0  Psychosocial & Spiritual Assessment  Palliative Care Outcomes  Patient/Family meeting held?  Yes  Who was at the meeting?  pt  Palliative Care follow-up planned  Yes, Facility      Patient Active Problem List   Diagnosis Date Noted  . PSVT (paroxysmal supraventricular tachycardia) ([place])   . CKD (chronic kidney disease) stage 3, GFR 30-59 ml/min   . Palliative care encounter   . Acute systolic (congestive) heart failure ([place])   . Acute on chronic combined systolic (congestive) and diastolic (congestive) heart failure ([place]) 02/03/2016  . AKI (acute kidney injury) ([place]) 12/09/2015  . Chronic combined systolic and diastolic CHF (congestive heart failure) ([place])   . Coronary artery disease involving coronary bypass graft of native heart without angina pectoris   . Subdural hematoma ([place]) 11/13/2015  . Acute on chronic combined systolic and diastolic CHF, NYHA class 3 ([place]) 11/03/2015  . CAD (coronary artery disease) of artery bypass graft 10/21/2015  .  CAD (coronary artery disease) 10/21/2015  . Acute exacerbation of CHF (congestive heart failure) ([place]) 10/07/2015  . Acute on chronic combined systolic and diastolic CHF (congestive heart failure) ([place]) 10/07/2015  . Anasarca 10/07/2015  . Normocytic anemia 10/07/2015  . Leg pain 11/13/2014  . Encounter for therapeutic drug monitoring 08/05/2013  . Chronic passive hepatic congestion 07/09/2012  . Chest pain  07/01/2012  . Jaundice 05/15/2012  . Syncope 04/12/2012  . Abnormal liver function tests 04/12/2012  . Chronic systolic heart failure ([place]) 04/12/2012  . Edema 04/12/2012  . Sleep apnea 04/12/2012  . Automatic implantable cardioverter-defibrillator in situ 03/12/2012  . Encounter for long-term (current) use of anticoagulants 12/18/2011  . Essential hypertension   . H/O atherosclerotic cardiovascular disease   . Ischemic cardiomyopathy   . Other pulmonary embolism and infarction   . Hyperlipidemia   . DOE (dyspnea on exertion)     Palliative Care Assessment & Plan   Patient Profile: DAREN YEAGLE is a 70 y.o. male with history of ischemic cardiomyopathy status post CABG in 2003 as well as previous stenting. Most recent 2-D echo in 2016 EF 25% with restrictive diastolic dysfunction, moderate RV dysfunction PAS P 64, status post ICD followed by Dr. Rayann Heman with normal check in 02/2015. In June 2017 had subdural hema  Pt admitted 5/4 - 10/13/15 with recurrent volume overload in setting of dietary non-compliance. Echo during that admission showed EF worsening to 15-20% from 25%.  Seen in Dr. Harl Bowie office 10/20/15. Mild volume overload with dietary indiscretion. Torsemide increased to 60/40 mg. Weight at that visit 159 lbs  Admitted 6/10 through 11/20/15 with subdural hematoma and heart failure. Had evacuation of subdural hematoma. Required short term inotropes. He was discharged off bb and ace. Discharge weight was 147 pounds.   Presented to HF clinic 02/01/16. Had continued weight gain and worsening dyspnea despite addition of metolazone.  DOE with ADLs and walking short distances.  + Bendopnea. Torsemide and metolazone increased with close follow up scheduled.   Assessment: It is not clear to me at this point that patient truly can't appreciate the severity of his illness. It's difficult to keep him on track with discussion, and despite hearing that his heart failure is end-stage and  there is no further treatments to offer him his response is to "I'll try to do better about eating salt". His spouse is still trying to respect his autonomy but I'm not sure that now with his heart failure at this place he has the ability to truly appreciate the pros and cons of full resuscitation efforts as well as leading an active AICD in the setting of combined heart failure with an ejection fraction of 15%.  Recommendations/Plan:  Consider an official capacity hearing. This might be helpful especially in terms of spouse feeling more comfortable in making decisions on her husband's behalf.  Goals of Care and Additional Recommendations:  Limitations on Scope of Treatment: Full Scope Treatment  Code Status:    Code Status Orders        Start     Ordered   02/04/16 1353  Limited resuscitation (code)  Continuous    Question Answer Comment  In the event of cardiac or respiratory ARREST: Initiate Code Blue, Call Rapid Response Yes   In the event of cardiac or respiratory ARREST: Perform CPR Yes   In the event of cardiac or respiratory ARREST: Perform Intubation/Mechanical Ventilation No   In the event of cardiac or respiratory ARREST: Use NIPPV/BiPAp only if  indicated Yes   In the event of cardiac or respiratory ARREST: Administer ACLS medications if indicated Yes   In the event of cardiac or respiratory ARREST: Perform Defibrillation or Cardioversion if indicated Yes      02/04/16 1352    Code Status History    Date Active Date Inactive Code Status Order ID Comments User Context   02/03/2016 11:37 AM 02/04/2016  1:52 PM Full Code [id number]  Shirley Friar, PA-C Inpatient   11/13/2015  8:41 PM 11/20/2015  4:45 PM Full Code [id number]  Ashok Pall, MD ED   11/03/2015  3:35 PM 11/12/2015  3:36 PM Full Code [id number]  Shirley Friar, PA-C Inpatient   10/07/2015  6:37 PM 10/13/2015  3:34 PM Full Code [id number]  Erline Hau, MD Inpatient       Prognosis:   < 6  months barring an acute cardiopulmonary failure for which he is at high risk  Discharge Planning:  Home with [place] was discussed with Dr. Jeffie Pollock  Thank you for allowing the Palliative Medicine Team to assist in the care of this patient.   Time In: 1600 Time Out: 1700 Total Time 60 min Prolonged Time Billed  no       Greater than 50%  of this time was spent counseling and coordinating care related to the above assessment and plan.  Dory Horn, NP  Please contact Palliative Medicine Team phone at [phone] for questions and concerns.

## 2016-02-07 LAB — CARBOXYHEMOGLOBIN
CARBOXYHEMOGLOBIN: 2.6 % — AB (ref 0.5–1.5)
Methemoglobin: 0.8 % (ref 0.0–1.5)
O2 Saturation: 84.2 %
Total hemoglobin: 11.3 g/dL — ABNORMAL LOW (ref 12.0–16.0)

## 2016-02-07 LAB — BASIC METABOLIC PANEL
ANION GAP: 8 (ref 5–15)
BUN: 44 mg/dL — AB (ref 6–20)
CALCIUM: 9.3 mg/dL (ref 8.9–10.3)
CO2: 25 mmol/L (ref 22–32)
Chloride: 98 mmol/L — ABNORMAL LOW (ref 101–111)
Creatinine, Ser: 1.4 mg/dL — ABNORMAL HIGH (ref 0.61–1.24)
GFR calc Af Amer: 57 mL/min — ABNORMAL LOW (ref >60)
GFR, EST NON AFRICAN AMERICAN: 49 mL/min — AB (ref >60)
GLUCOSE: 164 mg/dL — AB (ref 65–99)
Potassium: 4.2 mmol/L (ref 3.5–5.1)
Sodium: 131 mmol/L — ABNORMAL LOW (ref 135–145)

## 2016-02-07 LAB — PROTIME-INR
INR: 1.33
Prothrombin Time: 16.5 seconds — ABNORMAL HIGH (ref 11.4–15.2)

## 2016-02-07 MED ORDER — TORSEMIDE 20 MG PO TABS
60.0000 mg | ORAL_TABLET | Freq: Every day | ORAL | Status: DC
  Administered 2016-02-07 – 2016-02-08 (×2): 60 mg via ORAL
  Filled 2016-02-07 (×2): qty 3

## 2016-02-07 MED ORDER — WARFARIN SODIUM 5 MG PO TABS
5.0000 mg | ORAL_TABLET | Freq: Once | ORAL | Status: AC
Start: 2016-02-07 — End: 2016-02-07
  Administered 2016-02-07: 5 mg via ORAL
  Filled 2016-02-07: qty 1

## 2016-02-07 NOTE — Progress Notes (Signed)
Occupational Therapy Treatment Patient Details Name: Rick [name] MRN: [id number] DOB: [birth year] Today's Date: 02/07/2016    History of present illness 70 y.o. male admitted for increased SOB and edema. PMH significant for CHF, CAD, AICD, dyspnea on exertion, HTN, HLD, ischemic cardiomyopathy, MI, non-alcoholic cirrhosis, hx of PE and infarction, pulmonary HTN, CVA (10/2015), subdural hematoma w/ burr hole procedure (11/2015).   OT comments  Pt demonstrating ability to perform ADL and mobility at a modified independent level. Walked unit with cane. Educated in energy conservation. Will continue to follow.  Follow Up Recommendations  No OT follow up;Supervision - Intermittent    Equipment Recommendations  None recommended by OT    Recommendations for Other Services      Precautions / Restrictions Restrictions Weight Bearing Restrictions: No       Mobility Bed Mobility Overal bed mobility: Independent                Transfers Overall transfer level: Modified independent Equipment used: Straight cane             General transfer comment: modified independent      Balance     Sitting balance-Leahy Scale: Normal       Standing balance-Leahy Scale: Good                     ADL Overall ADL's : Modified independent                                       General ADL Comments: educated pt in energy conservation, ambulated in unit      Vision                     Perception     Praxis      Cognition   Behavior During Therapy: [facility] for tasks assessed/performed Overall Cognitive Status: Within Functional Limits for tasks assessed                       Extremity/Trunk Assessment               Exercises     Shoulder Instructions       General Comments      Pertinent Vitals/ Pain       Pain Assessment: No/denies pain  Home Living                                          Prior  Functioning/Environment              Frequency Min 2X/week     Progress Toward Goals  OT Goals(current goals can now be found in the care plan section)  Progress towards OT goals: Progressing toward goals  Acute Rehab OT Goals Patient Stated Goal: keep fluid off without IV meds Time For Goal Achievement: 02/18/16 Potential to Achieve Goals: Good  Plan Discharge plan remains appropriate    Co-evaluation                 End of Session Equipment Utilized During Treatment: Gait belt (cane)   Activity Tolerance Patient tolerated treatment well   Patient Left in bed;with call bell/phone within reach   Nurse Communication          Time: [phone]  OT Time Calculation (min): 32 min  Charges: OT General Charges $OT Visit: 1 Procedure OT Treatments $Self Care/Home Management : 23-37 mins  Malka So 02/07/2016, 3:53 PM3 [phone]

## 2016-02-07 NOTE — Progress Notes (Signed)
Advanced Heart Failure Rounding Note   Subjective:    Started milrinone and IV lasix 8/31. Had 2-hour run of SVT on 9/3  Negative 1.1 L yesterday but weight recorded as up a poundagain.  Now on amio for SVT. No recurrent SVT  Feels ok. Started on prednisone for + gout pain in left big toe on 9/3.   Denies dyspnea. Co-ox 84%  K 4.2  He remains confused at times and has a lot of problems managing his IV in his room (frequently tangled).     Objective:   Weight Range:  Vital Signs:   Temp:  [97.6 F (36.4 C)-98.3 F (36.8 C)] 97.9 F (36.6 C) (09/04 0354) Pulse Rate:  [71-82] 75 (09/04 0354) Resp:  [16-19] 19 (09/04 0500) BP: (96-115)/(60-82) 96/68 (09/04 0500) SpO2:  [98 %-100 %] 100 % (09/04 0354) Weight:  [77.7 kg (171 lb 4.8 oz)] 77.7 kg (171 lb 4.8 oz) (09/04 0354) Last BM Date: 02/06/16  Weight change: Filed Weights   02/05/16 0525 02/06/16 0600 02/07/16 0354  Weight: 75.1 kg (165 lb 8 oz) 77.2 kg (170 lb 1.6 oz) 77.7 kg (171 lb 4.8 oz)    Intake/Output:   Intake/Output Summary (Last 24 hours) at 02/07/16 0906 Last data filed at 02/07/16 0752  Gross per 24 hour  Intake              902 ml  Output             1875 ml  Net             -973 ml     Physical Exam: General: Lying in bed. NAD HEENT: normal Neck: supple. JVD 9 prominent cv waves. Carotids 2+ bilat; no bruits. No lymphadenopathy or thyromegaly appreciated. Cor: PMI nondisplaced. RRR. No rubs. 3/6 MR. No S3. Lungs: Diminished basilar sounds, with scant crackles Abdomen: soft, NT, ND, no HSM. No bruits or masses. +BS  Extremities: no cyanosis, clubbing, rash. Trace  Edema L big toe tender to touch  Neuro: alert & oriented x 3, cranial nerves grossly intact. moves all 4 extremities w/o difficulty. Affect pleasant.   Telemetry: NSR with 80s Frequent PVCs  Labs: Basic Metabolic Panel:  Recent Labs Lab 02/03/16 1210 02/04/16 0131 02/04/16 1525 02/05/16 0449 02/06/16 0510  02/07/16 0428  NA 132* 133*  --  132* 131* 131*  K 4.4 3.9  --  4.0 4.4 4.2  CL 100* 96*  --  95* 96* 98*  CO2 25 25  --  28 26 25   GLUCOSE 113* 86  --  152* 109* 164*  BUN 38* 46*  --  38* 46* 44*  CREATININE 1.71* 1.74*  --  1.56* 1.58* 1.40*  CALCIUM 9.5 9.9  --  9.5 9.3 9.3  MG 2.2  --  2.7*  --   --   --     Liver Function Tests:  Recent Labs Lab 02/03/16 1210  AST 27  ALT 16*  ALKPHOS 123  BILITOT 1.9*  PROT 7.8  ALBUMIN 3.5   No results for input(s): LIPASE, AMYLASE in the last 168 hours. No results for input(s): AMMONIA in the last 168 hours.  CBC:  Recent Labs Lab 02/03/16 1210 02/05/16 0449  WBC 7.2 6.6  NEUTROABS 4.7 3.5  HGB 11.7* 12.3*  HCT 37.6* 39.5  MCV 93.3 91.9  PLT 138* 154    Cardiac Enzymes:  Recent Labs Lab 02/03/16 1210 02/03/16 1701 02/04/16 0007  TROPONINI 0.03* <0.03 <0.03  BNP: BNP (last 3 results)  Recent Labs  01/18/16 1220 02/01/16 1619 02/03/16 1210  BNP 1,572.0* 1,585.7* 838.4*    ProBNP (last 3 results) No results for input(s): PROBNP in the last 8760 hours.    Other results:  Imaging: No results found.   Medications:     Scheduled Medications: . amiodarone  200 mg Oral BID  . ferrous sulfate  325 mg Oral Q breakfast  . magnesium oxide  400 mg Oral Daily  . sodium chloride flush  10-40 mL Intracatheter Q12H  . sodium chloride flush  3 mL Intravenous Q12H  . Warfarin - Pharmacist Dosing Inpatient   Does not apply q1800    Infusions: . milrinone 0.25 mcg/kg/min (02/07/16 0435)    PRN Medications: sodium chloride, acetaminophen, lidocaine, ondansetron (ZOFRAN) IV, oxyCODONE, polyethylene glycol, sodium chloride flush, sodium chloride flush   Assessment:   1. 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 CAD s/p CABG 3. Hx of CVA - on coumadin 4. Hx of Subdural Hematoma s/p evacuation on 11/15/15. 5. CKD stage III-IV 6. OSA  intolerant to CPAP.  7. PSVT 8. ETOH abuse (quit in July) 9. Acute gout  Plan/Discussion:     Mr. Whalley has severe/end-stage biventricular HF with marked volume overload. Feeling much better with milrinone and IV diuresis. I/Os negative but weight up slightly. Renal function stable. BP ok. Off IV lasix. Will start po torsemide today.   On milrinone 0.125. Co-ox ok. Family meeting yesterday with Palliative Care confirmed poor insight into how severe his HF is and limited ability to make decisions. (I discussed this with [facility] team personally)  He also has extensive trouble managing his IV in his room. Thus not candidate for home inotropes. Stop milrinone today. If has recurrent HF with low output will need Hospice.   No further SVT. Continue po amio. Keep K >= 4.0 and Mag >=2.0  Continue prednisone for gout.    Length of Stay: 4  Bensimhon, Daniel MD 02/07/2016, 9:06 AM  Advanced Heart Failure Team Pager [phone] (M-F; [place])  Please contact [facility] for night-coverage after hours (4p -7a ) and weekends on [email or web address]

## 2016-02-07 NOTE — Progress Notes (Signed)
ANTICOAGULATION CONSULT NOTE - Follow Up Consult  Pharmacy Consult for Warfarin Indication: pulmonary embolus and possible stroke  No Known Allergies  Patient Measurements: Height: 5\' 11"  (180.3 cm) Weight: 171 lb 4.8 oz (77.7 kg) IBW/kg (Calculated) : 75.3  Vital Signs: Temp: 97.9 F (36.6 C) (09/04 0830) Temp Source: Oral (09/04 0830) BP: 112/68 (09/04 0830) Pulse Rate: 79 (09/04 0830)  Labs:  Recent Labs  02/05/16 0449 02/06/16 0510 02/07/16 0428  HGB 12.3*  --   --   HCT 39.5  --   --   PLT 154  --   --   LABPROT 17.4* 16.4* 16.5*  INR 1.42 1.31 1.33  CREATININE 1.56* 1.58* 1.40*    Estimated Creatinine Clearance: 52.3 mL/min (by C-G formula based on SCr of 1.4 mg/dL).  Medications:  Scheduled:  . amiodarone  200 mg Oral BID  . ferrous sulfate  325 mg Oral Q breakfast  . magnesium oxide  400 mg Oral Daily  . sodium chloride flush  10-40 mL Intracatheter Q12H  . sodium chloride flush  3 mL Intravenous Q12H  . torsemide  60 mg Oral Daily  . warfarin  5 mg Oral ONCE-1800    Assessment: JP is a 70 yo male who was admitted on 02/03/2016 for worsening heart failure. Pharmacy is consulted to manage his warfarin therapy. PTA he was maintained on warfarin 3mg  daily for history of PE and possible stroke. INR on admission was subtherapeutic at 1.4, and is subtherapeutic at 1.33 today. Hemoglobin and platelets are stable as of 9/2. No bleeding noted.    Of note, patient was started on amiodarone on 9/2 for SVT for 2 hours. There is a delayed interaction between warfarin and amiodarone that may take 4-5 days before effects are seen. A direct oral anticoagulant, such as Xarelto or Eliquis, may be more appropriate upon discharge since there are no interactions between these 2 medications and amiodarone.   Goal of Therapy:  INR 2-3 Monitor platelets by anticoagulation protocol: Yes   Plan:  Warfarin 5mg  po x 1 dose tonight at 1800 Monitor daily INR and CBC, s/sx's of  bleeding as needed Follow-up on discharge plans for anticoagulation  Belia Heman, PharmD PGY1 Pharmacy Resident [phone] (Pager) 02/07/2016 10:04 AM

## 2016-02-08 ENCOUNTER — Encounter (HOSPITAL_COMMUNITY): Payer: Medicare Other

## 2016-02-08 LAB — CARBOXYHEMOGLOBIN
CARBOXYHEMOGLOBIN: 2.1 % — AB (ref 0.5–1.5)
Methemoglobin: 0.8 % (ref 0.0–1.5)
O2 SAT: 61.7 %
TOTAL HEMOGLOBIN: 12.7 g/dL (ref 12.0–16.0)

## 2016-02-08 LAB — BASIC METABOLIC PANEL
ANION GAP: 10 (ref 5–15)
BUN: 46 mg/dL — ABNORMAL HIGH (ref 6–20)
CALCIUM: 9.7 mg/dL (ref 8.9–10.3)
CO2: 26 mmol/L (ref 22–32)
Chloride: 97 mmol/L — ABNORMAL LOW (ref 101–111)
Creatinine, Ser: 1.44 mg/dL — ABNORMAL HIGH (ref 0.61–1.24)
GFR, EST AFRICAN AMERICAN: 55 mL/min — AB (ref >60)
GFR, EST NON AFRICAN AMERICAN: 48 mL/min — AB (ref >60)
Glucose, Bld: 116 mg/dL — ABNORMAL HIGH (ref 65–99)
Potassium: 4.1 mmol/L (ref 3.5–5.1)
Sodium: 133 mmol/L — ABNORMAL LOW (ref 135–145)

## 2016-02-08 LAB — PROTIME-INR
INR: 1.4
Prothrombin Time: 17.3 seconds — ABNORMAL HIGH (ref 11.4–15.2)

## 2016-02-08 MED ORDER — TORSEMIDE 20 MG PO TABS: 60.0000 mg | ORAL_TABLET | Freq: Two times a day (BID) | ORAL | Status: DC

## 2016-02-08 MED ORDER — METOLAZONE 5 MG PO TABS
2.5000 mg | ORAL_TABLET | Freq: Once | ORAL | Status: AC
  Administered 2016-02-08: 2.5 mg via ORAL
  Filled 2016-02-08: qty 1

## 2016-02-08 MED ORDER — FUROSEMIDE 10 MG/ML IJ SOLN
80.0000 mg | Freq: Once | INTRAMUSCULAR | Status: AC
  Administered 2016-02-08: 80 mg via INTRAVENOUS
  Filled 2016-02-08: qty 8

## 2016-02-08 MED ORDER — WARFARIN SODIUM 5 MG PO TABS: 5.0000 mg | ORAL_TABLET | Freq: Once | ORAL | Status: DC

## 2016-02-08 MED ORDER — AMIODARONE HCL 200 MG PO TABS: 200.0000 mg | ORAL_TABLET | Freq: Two times a day (BID) | ORAL | 1 refills | Status: DC

## 2016-02-08 MED ORDER — TORSEMIDE 20 MG PO TABS: 60.0000 mg | ORAL_TABLET | Freq: Two times a day (BID) | ORAL | 3 refills | Status: DC

## 2016-02-08 NOTE — Care Management Note (Signed)
Case Management Note  Patient Details  Name: KARRINGTON JEZEWSKI MRN: [id number] Date of Birth: Jan 27, 1946  Subjective/Objective:                    Action/Plan: Spoke with patient's wife Berge Bournes . Explained patient is being discharged today and there is an order for [facility] for CHF management . Mrs Portee voiced understanding and confirmed face sheet information. Choice offered over phone , she would like [facility] fax [phone] . Spoke with Maudie Mercury at [facility] who accepted referral. All information and orders faxed to [facility].  Mrs Toutant stated it takes her a hour to drive to [facility] she will be here this afternoon to transport her husband home.  Expected Discharge Date:                  Expected Discharge Plan:  [place]  In-House Referral:     Discharge planning Services  CM Consult  Post Acute Care Choice:  Home Health Choice offered to:  Spouse  DME Arranged:    DME Agency:     HH Arranged:  RN [place] Agency:  [facility]  Status of Service:     If discussed at [place] of Stay Meetings, dates discussed:    Additional Comments:  Marilu Favre, RN 02/08/2016, 2:29 PM

## 2016-02-08 NOTE — Discharge Summary (Signed)
Advanced Heart Failure Team  Discharge Summary   Patient ID: Rick [name] MRN: [id number], DOB/AGE: 1946-05-07 70 y.o. Admit date: 02/03/2016 D/C date:     02/08/2016   Primary Discharge Diagnoses:  1. 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 CAD s/p CABG 3. Hx of CVA - on coumadin 4. Hx of Subdural Hematoma s/p evacuation on 11/15/15. 5. CKD stage III-IV 6. OSA intolerant to CPAP.  7. PSVT 8. ETOH abuse (quit in July) 9. Acute gout  Hospital Course:  Rick [name] is a 70 y.o. male with history of ischemic cardiomyopathy status post CABG in 2003 as well as previous stenting. Most recent 2-D echo in 2016 EF 25% with restrictive diastolic dysfunction, moderate RV dysfunction PAS P 64, status post ICD followed by Rick. Rayann [name] with normal check in 02/2015. In June 2017 had subdural hematoma.  Admitted with A/C biventricular HF and volume overload. PICC line placed to guide therapy. Placed on short term milrinone but later weaned off and maintained adequate CO-OX. Diuresed with IV lasix and transitioned to torsemide 60 mg twice a day + metolazone. He will not be placed on bb with low output. Renal function closely.   He had episodes of SVT so he was started on amio 200 mg twice a day. Plan to follow TSH and LFTS every 6 months. Also give shot course of prednisone for gout flare.    Palliative Care consulted for goals of care. He and his wife wish to continue with current care. Both Palliative Care and Rick [name] agreed that he has poor insight related to his disease. Plan to continue discussion as an outpatient.    He will continue to be followed closely in the HF clinic and has follow up next week. He was instructed to call Coumadin Clinic in [place] to check INR. HH to follow for HF.   Discharge Weight: 168 pounds Discharge Vitals: Blood pressure 115/77, pulse 66, temperature 98 F (36.7 C), temperature source Oral, resp.  rate 16, height 5\' 11"  (1.803 m), weight 168 lb 8 oz (76.4 kg), SpO2 100 %.  Labs: Lab Results  Component Value Date   WBC 6.6 02/05/2016   HGB 12.3 (L) 02/05/2016   HCT 39.5 02/05/2016   MCV 91.9 02/05/2016   PLT 154 02/05/2016    Recent Labs Lab 02/03/16 1210  02/08/16 0411  NA 132*  < > 133*  K 4.4  < > 4.1  CL 100*  < > 97*  CO2 25  < > 26  BUN 38*  < > 46*  CREATININE 1.71*  < > 1.44*  CALCIUM 9.5  < > 9.7  PROT 7.8  --   --   BILITOT 1.9*  --   --   ALKPHOS 123  --   --   ALT 16*  --   --   AST 27  --   --   GLUCOSE 113*  < > 116*  < > = values in this interval not displayed. Lab Results  Component Value Date   CHOL 262 (H) 12/16/2015   HDL 51 12/16/2015   LDLCALC 195 (H) 12/16/2015   TRIG 80 12/16/2015   BNP (last 3 results)  Recent Labs  01/18/16 1220 02/01/16 1619 02/03/16 1210  BNP 1,572.0* 1,585.7* 838.4*    ProBNP (last 3 results) No results for input(s): PROBNP in the last 8760 hours.   Diagnostic Studies/Procedures   No results found.  Discharge Medications     Medication List    TAKE these medications   amiodarone 200 MG tablet Commonly known as:  PACERONE Take 1 tablet (200 mg total) by mouth 2 (two) times daily.   ferrous sulfate 325 (65 FE) MG tablet Take 325 mg by mouth daily with breakfast. Reported on 12/16/2015   magnesium oxide 400 MG tablet Commonly known as:  MAG-OX Take 1 tablet (400 mg total) by mouth daily.   metolazone 2.5 MG tablet Commonly known as:  ZAROXOLYN Take 1 tablet (2.5 mg total) by mouth 2 (two) times a week. Every Wed and Sat   oxyCODONE 5 MG immediate release tablet Commonly known as:  Oxy IR/ROXICODONE Take 1 tablet (5 mg total) by mouth every 4 (four) hours as needed for moderate pain.   polyethylene glycol packet Commonly known as:  MIRALAX / GLYCOLAX Take 17 g by mouth daily as needed for mild constipation. Reported on 12/09/2015   potassium chloride SA 20 MEQ tablet Commonly known as:   K-DUR,KLOR-CON Take 2 tablets (40 mEq total) by mouth 2 (two) times daily. Take an extra tab when you take metolazone   torsemide 20 MG tablet Commonly known as:  DEMADEX Take 3 tablets (60 mg total) by mouth 2 (two) times daily. What changed:  how much to take   warfarin 3 MG tablet Commonly known as:  COUMADIN Take 3 mg by mouth daily.       Disposition   The patient will be discharged in stable condition to home. Discharge Instructions    (HEART FAILURE PATIENTS) Call MD:  Anytime you have any of the following symptoms: 1) 3 pound weight gain in 24 hours or 5 pounds in 1 week 2) shortness of breath, with or without a dry hacking cough 3) swelling in the hands, feet or stomach 4) if you have to sleep on extra pillows at night in order to breathe.    Complete by:  As directed   Diet - low sodium heart healthy    Complete by:  As directed   Increase activity slowly    Complete by:  As directed     Follow-up Information    Rick Grinder, NP Follow up on 02/17/2016.   Specialty:  Cardiology Why:  Heart Failure Clinic at [place] information: [address] [place] [address] [facility]. Schedule an appointment as soon as possible for a visit today.   Specialty:  Cardiology Why:  For Coumadin follow up.  Contact information: [place] [phone]            Duration of Discharge Encounter: Greater than 35 minutes   Signed, Rick Grinder NP-C  02/08/2016, 2:12 PM   Patient seen and examined with Rick Grinder, NP. We discussed all aspects of the encounter. I agree with the assessment and plan as stated above.   He has end-stage HF. Volume status is much improved. Can go home today.He has been seen by Hospice and Palliative Care team and continue to demonstrate poor insight into the severity of his HF. Not candidate for home inotropes as he is unable to  manage a PICC line.   Rick [name] 1:40 AM

## 2016-02-08 NOTE — Progress Notes (Signed)
Advanced Heart Failure Rounding Note   Subjective:    Yesterday milrinone was stopped. Today CO-OX 62%.   Denies SOB.    Objective:   Weight Range:  Vital Signs:   Temp:  [97.4 F (36.3 C)-98.6 F (37 C)] 97.8 F (36.6 C) (09/05 0735) Pulse Rate:  [66-76] 69 (09/05 0735) Resp:  [16-22] 17 (09/05 0735) BP: (105-126)/(62-81) 112/70 (09/05 0735) SpO2:  [98 %-100 %] 100 % (09/05 0735) Weight:  [168 lb 8 oz (76.4 kg)] 168 lb 8 oz (76.4 kg) (09/05 0400) Last BM Date: 02/06/16  Weight change: Filed Weights   02/06/16 0600 02/07/16 0354 02/08/16 0400  Weight: 170 lb 1.6 oz (77.2 kg) 171 lb 4.8 oz (77.7 kg) 168 lb 8 oz (76.4 kg)    Intake/Output:   Intake/Output Summary (Last 24 hours) at 02/08/16 0908 Last data filed at 02/08/16 0400  Gross per 24 hour  Intake             1140 ml  Output             2525 ml  Net            -1385 ml     Physical Exam: General: Lying in bed. NAD HEENT: normal Neck: supple. JVD prominent cv waves. Carotids 2+ bilat; no bruits. No lymphadenopathy or thyromegaly appreciated. Cor: PMI nondisplaced. RRR. No rubs. 3/6 MR. No S3. Lungs: Diminished basilar sounds, with scant crackles Abdomen: soft, NT, ND, no HSM. No bruits or masses. +BS  Extremities: no cyanosis, clubbing, rash. R and LLE trace edema.  Neuro: alert & oriented x 3, cranial nerves grossly intact. moves all 4 extremities w/o difficulty. Affect pleasant.   Telemetry: NSR with 80s Frequent PVCs  Labs: Basic Metabolic Panel:  Recent Labs Lab 02/03/16 1210 02/04/16 0131 02/04/16 1525 02/05/16 0449 02/06/16 0510 02/07/16 0428 02/08/16 0411  NA 132* 133*  --  132* 131* 131* 133*  K 4.4 3.9  --  4.0 4.4 4.2 4.1  CL 100* 96*  --  95* 96* 98* 97*  CO2 25 25  --  28 26 25 26   GLUCOSE 113* 86  --  152* 109* 164* 116*  BUN 38* 46*  --  38* 46* 44* 46*  CREATININE 1.71* 1.74*  --  1.56* 1.58* 1.40* 1.44*  CALCIUM 9.5 9.9  --  9.5 9.3 9.3 9.7  MG 2.2  --  2.7*  --    --   --   --     Liver Function Tests:  Recent Labs Lab 02/03/16 1210  AST 27  ALT 16*  ALKPHOS 123  BILITOT 1.9*  PROT 7.8  ALBUMIN 3.5   No results for input(s): LIPASE, AMYLASE in the last 168 hours. No results for input(s): AMMONIA in the last 168 hours.  CBC:  Recent Labs Lab 02/03/16 1210 02/05/16 0449  WBC 7.2 6.6  NEUTROABS 4.7 3.5  HGB 11.7* 12.3*  HCT 37.6* 39.5  MCV 93.3 91.9  PLT 138* 154    Cardiac Enzymes:  Recent Labs Lab 02/03/16 1210 02/03/16 1701 02/04/16 0007  TROPONINI 0.03* <0.03 <0.03    BNP: BNP (last 3 results)  Recent Labs  01/18/16 1220 02/01/16 1619 02/03/16 1210  BNP 1,572.0* 1,585.7* 838.4*    ProBNP (last 3 results) No results for input(s): PROBNP in the last 8760 hours.    Other results:  Imaging: No results found.   Medications:     Scheduled Medications: . amiodarone  200  mg Oral BID  . ferrous sulfate  325 mg Oral Q breakfast  . magnesium oxide  400 mg Oral Daily  . sodium chloride flush  10-40 mL Intracatheter Q12H  . sodium chloride flush  3 mL Intravenous Q12H  . torsemide  60 mg Oral Daily  . Warfarin - Pharmacist Dosing Inpatient   Does not apply q1800    Infusions:    PRN Medications: sodium chloride, acetaminophen, lidocaine, ondansetron (ZOFRAN) IV, oxyCODONE, polyethylene glycol, sodium chloride flush, sodium chloride flush   Assessment:   1. 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 CAD s/p CABG 3. Hx of CVA - on coumadin 4. Hx of Subdural Hematoma s/p evacuation on 11/15/15. 5. CKD stage III-IV 6. OSA intolerant to CPAP.  7. PSVT 8. ETOH abuse (quit in July) 9. Acute gout  Plan/Discussion:    Mr. Janes has severe/end-stage biventricular HF with marked volume overload  CO-OX off milrinone 62%. Volume status ok. Continue torsemide 60 mg daily.    Family meeting with Palliative Care confirmed poor insight  into how severe his HF is and limited ability to make decisions. If has recurrent HF with low output will need Hospice.   No further SVT. Continue po amio 200 mg twice daily. Keep K >= 4.0 and Mag >=2.0  S/p prednisone x 2 days for gout.   Disposition to be determined.   Length of Stay: [place] NP-C 02/08/2016, 9:08 AM  Advanced Heart Failure Team Pager [phone] (M-F; 7a - 4p)  Please contact [facility] for night-coverage after hours (4p -7a ) and weekends on [email or web address]  Patient seen and examined with Darrick Grinder, NP. We discussed all aspects of the encounter. I agree with the assessment and plan as stated above.   Stable off milrinone. Not candidate for home inotropes.   Can go home today on torsemide 60 bid. Will need close f/u in HF Clinic. Continue amio 200 bid.  Bensimhon, Daniel,MD 11:12 AM

## 2016-02-08 NOTE — Progress Notes (Signed)
Pharmacist Heart Failure Core Measure Documentation  Assessment: Rick [name] has an EF documented as 15-20% on 10/08/2015 by Echo.  Rationale: Heart failure patients with left ventricular systolic dysfunction (LVSD) and an EF < 40% should be prescribed an angiotensin converting enzyme inhibitor (ACEI) or angiotensin receptor blocker (ARB) at discharge unless a contraindication is documented in the medical record.  This patient is not currently on an ACEI or ARB for HF.  This note is being placed in the record in order to provide documentation that a contraindication to the use of these agents is present for this encounter.  ACE Inhibitor or Angiotensin Receptor Blocker is contraindicated (specify all that apply)  []   ACEI allergy AND ARB allergy []   Angioedema []   Moderate or severe aortic stenosis []   Hyperkalemia [x]   Hypotension (per prior chart notes: lisinopril has been stopped because of low blood pressures) []   Renal artery stenosis []   Worsening renal function, preexisting renal disease or dysfunction  Hildred Laser, Pharm D 02/08/2016 10:13 AM

## 2016-02-08 NOTE — Care Management Important Message (Signed)
Important Message  Patient Details  Name: Rick [name] MRN: [id number] Date of Birth: [birth year]   Medicare Important Message Given:  Yes    [name] May 02/08/2016, 5:03 PM

## 2016-02-08 NOTE — Progress Notes (Signed)
Physical Therapy Treatment Patient Details Name: NICHOLOS HUAMAN MRN: [id number] DOB: 22-Nov-1945 Today's Date: 03-02-16    History of Present Illness 70 y.o. male admitted for increased SOB and edema. PMH significant for CHF, CAD, AICD, dyspnea on exertion, HTN, HLD, ischemic cardiomyopathy, MI, non-alcoholic cirrhosis, hx of PE and infarction, pulmonary HTN, CVA (10/2015), subdural hematoma w/ burr hole procedure (11/2015).    PT Comments    Patient mobilizing well today, denies pain or SOB. No physical assist or cues required. Will continue to see as indicated.   Follow Up Recommendations  No PT follow up     Equipment Recommendations  None recommended by PT    Recommendations for Other Services       Precautions / Restrictions Precautions Precautions: Fall Restrictions Weight Bearing Restrictions: No    Mobility  Bed Mobility Overal bed mobility: Independent                Transfers Overall transfer level: Modified independent Equipment used: Straight cane             General transfer comment: modified independent in   Ambulation/Gait Ambulation/Gait assistance: Modified independent (Device/Increase time) Ambulation Distance (Feet): 410 Feet Assistive device: Straight cane Gait Pattern/deviations: Step-through pattern;Narrow base of support   Gait velocity interpretation: Below normal speed for age/gender     Stairs            Wheelchair Mobility    Modified Rankin (Stroke Patients Only)       Balance     Sitting balance-Leahy Scale: Normal     Standing balance support: Single extremity supported Standing balance-Leahy Scale: Good                      Cognition Arousal/Alertness: Awake/alert Behavior During Therapy: WFL for tasks assessed/performed Overall Cognitive Status: Within Functional Limits for tasks assessed                      Exercises      General Comments        Pertinent Vitals/Pain Pain  Assessment: No/denies pain    Home Living                      Prior Function            PT Goals (current goals can now be found in the care plan section) Acute Rehab PT Goals Patient Stated Goal: keep fluid off without IV meds PT Goal Formulation: With patient Time For Goal Achievement: 02/11/16 Potential to Achieve Goals: Good Progress towards PT goals: Progressing toward goals    Frequency  Min 3X/week    PT Plan Current plan remains appropriate    Co-evaluation             End of Session Equipment Utilized During Treatment: Gait belt Activity Tolerance: Patient tolerated treatment well Patient left: in chair;with call bell/phone within reach     Time: 1130-1144 PT Time Calculation (min) (ACUTE ONLY): 14 min  Charges:  $Gait Training: 8-22 mins                    G CodesDuncan Dull 03-02-2016, 12:20 PM Alben Deeds, [place] DPT  [phone]

## 2016-02-08 NOTE — Progress Notes (Signed)
ANTICOAGULATION CONSULT NOTE - Follow Up Consult  Pharmacy Consult for Warfarin Indication: pulmonary embolus and possible stroke  No Known Allergies  Patient Measurements: Height: 5\' 11"  (180.3 cm) Weight: 168 lb 8 oz (76.4 kg) IBW/kg (Calculated) : 75.3  Vital Signs: Temp: 97.8 F (36.6 C) (09/05 0735) Temp Source: Oral (09/05 0735) BP: 112/70 (09/05 0735) Pulse Rate: 69 (09/05 0735)  Labs:  Recent Labs  02/06/16 0510 02/07/16 0428 02/08/16 0411  LABPROT 16.4* 16.5* 17.3*  INR 1.31 1.33 1.40  CREATININE 1.58* 1.40* 1.44*    Estimated Creatinine Clearance: 50.8 mL/min (by C-G formula based on SCr of 1.44 mg/dL).  Medications:  Scheduled:  . amiodarone  200 mg Oral BID  . ferrous sulfate  325 mg Oral Q breakfast  . magnesium oxide  400 mg Oral Daily  . sodium chloride flush  10-40 mL Intracatheter Q12H  . sodium chloride flush  3 mL Intravenous Q12H  . torsemide  60 mg Oral Daily  . warfarin  5 mg Oral ONCE-1800    Assessment: Rick [name] is a 70 yo male who was admitted on 02/03/2016 for worsening heart failure. Pharmacy is consulted to manage his warfarin therapy. PTA he was maintained on warfarin 3mg  daily for remote history of PE and stroke. INR on admission was subtherapeutic at 1.4, and has remained < 2 at 1.4. Hemoglobin and platelets are stable as of 9/2. No bleeding noted.    Of note, patient was started on amiodarone on 9/2 for SVT for 2 hours. There is a delayed interaction between warfarin and amiodarone that may take 4-5 days before effects are seen.    Goal of Therapy:  INR 2-3 Monitor platelets by anticoagulation protocol: Yes   Plan:  Warfarin 5mg  po x 1 dose tonight at 1800 Then restart home 3mg  daily - needs follow up warfarin appt as will need dose changes with new amiodarone     Bonnita Nasuti Pharm.D. CPP, BCPS Clinical Pharmacist [phone] 02/08/2016 11:17 AM

## 2016-02-10 ENCOUNTER — Ambulatory Visit (INDEPENDENT_AMBULATORY_CARE_PROVIDER_SITE_OTHER): Payer: Medicare Other | Admitting: *Deleted

## 2016-02-10 DIAGNOSIS — K7460 Unspecified cirrhosis of liver: Secondary | ICD-10-CM | POA: Diagnosis not present

## 2016-02-10 DIAGNOSIS — I255 Ischemic cardiomyopathy: Secondary | ICD-10-CM | POA: Diagnosis not present

## 2016-02-10 DIAGNOSIS — I4891 Unspecified atrial fibrillation: Secondary | ICD-10-CM

## 2016-02-10 DIAGNOSIS — N184 Chronic kidney disease, stage 4 (severe): Secondary | ICD-10-CM | POA: Diagnosis not present

## 2016-02-10 DIAGNOSIS — I130 Hypertensive heart and chronic kidney disease with heart failure and stage 1 through stage 4 chronic kidney disease, or unspecified chronic kidney disease: Secondary | ICD-10-CM | POA: Diagnosis not present

## 2016-02-10 DIAGNOSIS — Z5181 Encounter for therapeutic drug level monitoring: Secondary | ICD-10-CM

## 2016-02-10 DIAGNOSIS — I2510 Atherosclerotic heart disease of native coronary artery without angina pectoris: Secondary | ICD-10-CM | POA: Diagnosis not present

## 2016-02-10 DIAGNOSIS — I5043 Acute on chronic combined systolic (congestive) and diastolic (congestive) heart failure: Secondary | ICD-10-CM | POA: Diagnosis not present

## 2016-02-10 LAB — POCT INR: INR: 1.5

## 2016-02-14 DIAGNOSIS — I2510 Atherosclerotic heart disease of native coronary artery without angina pectoris: Secondary | ICD-10-CM | POA: Diagnosis not present

## 2016-02-14 DIAGNOSIS — I130 Hypertensive heart and chronic kidney disease with heart failure and stage 1 through stage 4 chronic kidney disease, or unspecified chronic kidney disease: Secondary | ICD-10-CM | POA: Diagnosis not present

## 2016-02-14 DIAGNOSIS — I255 Ischemic cardiomyopathy: Secondary | ICD-10-CM | POA: Diagnosis not present

## 2016-02-14 DIAGNOSIS — I5043 Acute on chronic combined systolic (congestive) and diastolic (congestive) heart failure: Secondary | ICD-10-CM | POA: Diagnosis not present

## 2016-02-14 DIAGNOSIS — K7460 Unspecified cirrhosis of liver: Secondary | ICD-10-CM | POA: Diagnosis not present

## 2016-02-14 DIAGNOSIS — N184 Chronic kidney disease, stage 4 (severe): Secondary | ICD-10-CM | POA: Diagnosis not present

## 2016-02-15 ENCOUNTER — Encounter: Payer: Self-pay | Admitting: Internal Medicine

## 2016-02-16 DIAGNOSIS — I255 Ischemic cardiomyopathy: Secondary | ICD-10-CM | POA: Diagnosis not present

## 2016-02-16 DIAGNOSIS — I2510 Atherosclerotic heart disease of native coronary artery without angina pectoris: Secondary | ICD-10-CM | POA: Diagnosis not present

## 2016-02-16 DIAGNOSIS — I130 Hypertensive heart and chronic kidney disease with heart failure and stage 1 through stage 4 chronic kidney disease, or unspecified chronic kidney disease: Secondary | ICD-10-CM | POA: Diagnosis not present

## 2016-02-16 DIAGNOSIS — N184 Chronic kidney disease, stage 4 (severe): Secondary | ICD-10-CM | POA: Diagnosis not present

## 2016-02-16 DIAGNOSIS — K7460 Unspecified cirrhosis of liver: Secondary | ICD-10-CM | POA: Diagnosis not present

## 2016-02-16 DIAGNOSIS — I5043 Acute on chronic combined systolic (congestive) and diastolic (congestive) heart failure: Secondary | ICD-10-CM | POA: Diagnosis not present

## 2016-02-17 ENCOUNTER — Ambulatory Visit (HOSPITAL_COMMUNITY)
Admission: RE | Admit: 2016-02-17 | Discharge: 2016-02-17 | Disposition: A | Discharge status: Home / Self Care | Payer: Medicare Other | Source: Ambulatory Visit | Attending: Internal Medicine | Admitting: Internal Medicine

## 2016-02-17 ENCOUNTER — Telehealth (HOSPITAL_COMMUNITY): Payer: Self-pay | Admitting: Cardiology

## 2016-02-17 ENCOUNTER — Encounter (HOSPITAL_COMMUNITY): Payer: Self-pay

## 2016-02-17 VITALS — BP 101/59 | HR 73 | Wt 175.1 lb

## 2016-02-17 DIAGNOSIS — Z7901 Long term (current) use of anticoagulants: Secondary | ICD-10-CM | POA: Diagnosis not present

## 2016-02-17 DIAGNOSIS — I509 Heart failure, unspecified: Secondary | ICD-10-CM

## 2016-02-17 DIAGNOSIS — N183 Chronic kidney disease, stage 3 unspecified: Secondary | ICD-10-CM

## 2016-02-17 DIAGNOSIS — I5022 Chronic systolic (congestive) heart failure: Secondary | ICD-10-CM | POA: Diagnosis not present

## 2016-02-17 DIAGNOSIS — Z95810 Presence of automatic (implantable) cardiac defibrillator: Secondary | ICD-10-CM | POA: Diagnosis not present

## 2016-02-17 DIAGNOSIS — I471 Supraventricular tachycardia: Secondary | ICD-10-CM | POA: Insufficient documentation

## 2016-02-17 DIAGNOSIS — I6200 Nontraumatic subdural hemorrhage, unspecified: Secondary | ICD-10-CM | POA: Insufficient documentation

## 2016-02-17 DIAGNOSIS — F1010 Alcohol abuse, uncomplicated: Secondary | ICD-10-CM | POA: Insufficient documentation

## 2016-02-17 DIAGNOSIS — G4733 Obstructive sleep apnea (adult) (pediatric): Secondary | ICD-10-CM | POA: Diagnosis not present

## 2016-02-17 DIAGNOSIS — I255 Ischemic cardiomyopathy: Secondary | ICD-10-CM | POA: Insufficient documentation

## 2016-02-17 DIAGNOSIS — I5043 Acute on chronic combined systolic (congestive) and diastolic (congestive) heart failure: Secondary | ICD-10-CM | POA: Diagnosis not present

## 2016-02-17 DIAGNOSIS — Z79899 Other long term (current) drug therapy: Secondary | ICD-10-CM | POA: Diagnosis not present

## 2016-02-17 DIAGNOSIS — I130 Hypertensive heart and chronic kidney disease with heart failure and stage 1 through stage 4 chronic kidney disease, or unspecified chronic kidney disease: Secondary | ICD-10-CM | POA: Diagnosis not present

## 2016-02-17 DIAGNOSIS — Z951 Presence of aortocoronary bypass graft: Secondary | ICD-10-CM | POA: Insufficient documentation

## 2016-02-17 DIAGNOSIS — Z8673 Personal history of transient ischemic attack (TIA), and cerebral infarction without residual deficits: Secondary | ICD-10-CM | POA: Diagnosis not present

## 2016-02-17 DIAGNOSIS — Z87891 Personal history of nicotine dependence: Secondary | ICD-10-CM | POA: Diagnosis not present

## 2016-02-17 LAB — TSH: TSH: 4.331 u[IU]/mL (ref 0.350–4.500)

## 2016-02-17 LAB — BASIC METABOLIC PANEL WITH GFR
Anion gap: 10 (ref 5–15)
BUN: 41 mg/dL — ABNORMAL HIGH (ref 6–20)
CO2: 24 mmol/L (ref 22–32)
Calcium: 9.9 mg/dL (ref 8.9–10.3)
Chloride: 100 mmol/L — ABNORMAL LOW (ref 101–111)
Creatinine, Ser: 1.54 mg/dL — ABNORMAL HIGH (ref 0.61–1.24)
GFR calc Af Amer: 51 mL/min — ABNORMAL LOW
GFR calc non Af Amer: 44 mL/min — ABNORMAL LOW
Glucose, Bld: 129 mg/dL — ABNORMAL HIGH (ref 65–99)
Potassium: 5.1 mmol/L (ref 3.5–5.1)
Sodium: 134 mmol/L — ABNORMAL LOW (ref 135–145)

## 2016-02-17 MED ORDER — TORSEMIDE 20 MG PO TABS: 60.0000 mg | ORAL_TABLET | Freq: Two times a day (BID) | ORAL | 3 refills | Status: DC

## 2016-02-17 MED ORDER — ATORVASTATIN CALCIUM 40 MG PO TABS: 40.0000 mg | ORAL_TABLET | Freq: Every day | ORAL | 3 refills | Status: DC

## 2016-02-17 MED ORDER — AMIODARONE HCL 100 MG PO TABS: 200.0000 mg | ORAL_TABLET | Freq: Every day | ORAL | 3 refills | Status: DC

## 2016-02-17 MED ORDER — POTASSIUM CHLORIDE CRYS ER 20 MEQ PO TBCR: 40.0000 meq | EXTENDED_RELEASE_TABLET | Freq: Two times a day (BID) | ORAL | 6 refills | Status: DC

## 2016-02-17 NOTE — Progress Notes (Signed)
Patient ID: Rick [name], male   DOB: [birth year], 70 y.o.   MRN: [id number]    Advanced Heart Failure Clinic Note   Referring Physician: Dr Harl Bowie Primary Care: Wende Neighbors, MD Primary Cardiologist: Dr Harl Bowie  HPI: Rick [name] is a 70 y.o. male with history of ischemic cardiomyopathy status post CABG in 2003 as well as previous stenting. Most recent 2-D echo in 2016 EF 25% with restrictive diastolic dysfunction, moderate RV dysfunction PAS P 64, status post ICD followed by Dr. Rayann Heman with normal check in 02/2015. In June 2017 had subdural hema  Pt admitted 5/4 - 10/13/15 with recurrent volume overload in setting of dietary non-compliance. Echo during that admission showed EF worsening to 15-20% from 25%.  Seen in Dr. Harl Bowie office 10/20/15. Mild volume overload with dietary indiscretion. Torsemide increased to 60/40 mg. Weight at that visit 159 lbs  Admitted 6/10 through 11/20/15 with subdural hematoma and heart failure. Had evacuation of subdural hematoma. Required short term inotropes.  He was discharged off bb and ace. Discharge weight was 147 pounds.   Admitted 8/31 through 02/08/16 with marked volume overload. Diuresed with IV lasix transitioned to torsemide 60 mg tiwce a day + metolazone . No BB with low output. Had episodes of SVT so he was started on amio 200 mg tiwce daily. Met with Pallitative for GOC. Plans to continue full support. Discharge weight was 168 pounds.   He presents today for hospital follow up. Since discharge he has been taking torsemide 40 mg twice daily instead of torsemide 60 mg twice a day. Also taking amio 100 mg instead of 200 mg twice daily. Overall says he feels great.  Mild SOB with exertion. Weight at home 170-171 pounds. Eating watermelon daily. Ambulates with a cane. No bleeding problems. Followed by [facility] [place]. Lives with his wife.     Echo 10/08/15 LVEF 15-20%, Severe LV dilation, Severe hypokinesis with areas of akinesis,  Trivial AI, Mild MR, Mod LAE, Severe RV dilation, Severe TR, PA peak pressure 36 mm Hg Echo 03/04/15 LVEF 25%,  Severe hypokinesis, Trivial AI, Mild MR, RV mod dilated and mod reduced, Severe RAE, Mod TR, PA peak pressure 64 mm Hg.   Labs (7/17): K 4.1, creatinine 1.76, BNP 654 Labs 12/16/2015: K 2.9 Creatinine 2.04  Labs (8/17): K 3.2, creatinine 1.65, BNP 1572 Labs (02/08/2016) K 4.4 Creatinine 1.71    Past Medical History:  Diagnosis Date  . AICD (automatic cardioverter/defibrillator) present   . Anxiety   . CAD (coronary artery disease)   . CHF (congestive heart failure) ([place])   . DOE (dyspnea on exertion)   . Essential hypertension   . Family history of adverse reaction to anesthesia    "sister would go into seizures"  . Hyperlipidemia   . Ischemic cardiomyopathy    EF 20% by echo 2013  . Myocardial infarction acute ([place]) 2003  . 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 Other pulmonary embolism and infarction    On coumadin  . Pulmonary hypertension (HCC)    56 mm Hg  . Sleep apnea    "tried machine a couple nights; couldn't do it" (11/03/2015)  . Stroke [facility])    left side facial droop since; don't know when it was" (11/03/2015)  . Tricuspid regurgitation    Severe    Current Outpatient Prescriptions  Medication Sig Dispense Refill  . amiodarone (PACERONE) 200 MG tablet Take 1 tablet (200 mg total) by mouth 2 (two) times daily.  60 tablet 1  . docusate sodium (COLACE) 100 MG capsule Take 100 mg by mouth 2 (two) times daily as needed for mild constipation.    . ferrous sulfate 325 (65 FE) MG tablet Take 325 mg by mouth daily with breakfast. Reported on 12/16/2015    . magnesium oxide (MAG-OX) 400 MG tablet Take 1 tablet (400 mg total) by mouth daily. 30 tablet 3  . metolazone (ZAROXOLYN) 2.5 MG tablet Take 1 tablet (2.5 mg total) by mouth 2 (two) times a week. Every Wed and Sat 10 tablet 3  . oxyCODONE (OXY IR/ROXICODONE) 5 MG immediate release tablet Take 1 tablet (5 mg total) by  mouth every 4 (four) hours as needed for moderate pain. 50 tablet 0  . polyethylene glycol (MIRALAX / GLYCOLAX) packet Take 17 g by mouth daily as needed for mild constipation. Reported on 12/09/2015    . potassium chloride SA (K-DUR,KLOR-CON) 20 MEQ tablet Take 40 mEq by mouth 2 (two) times daily.    [name] torsemide (DEMADEX) 20 MG tablet Take 40 mg by mouth 2 (two) times daily.    [name] warfarin (COUMADIN) 3 MG tablet Take 3 mg by mouth daily.     No current facility-administered medications for this encounter.     No Known Allergies    Social History   Social History  . Marital status: Married    Spouse name: N/A  . Number of children: 4  . Years of education: N/A   Occupational History  . Disabled.    Social History Main Topics  . Smoking status: Former Smoker    Packs/day: 0.50    Years: 38.00    Types: Cigarettes    Start date: 12/30/1956    Quit date: 06/06/1995  . Smokeless tobacco: Never Used  . Alcohol use 1.8 oz/week    3 Cans of beer per week     Comment: 11/03/2015 "Quit from [id number]; weaning myself off again"  . Drug use: No  . Sexual activity: Not Currently   Other Topics Concern  . Not on file   Social History Narrative   Lives with wife and brother in [place] and grandson in [place].        Family History  Problem Relation Age of Onset  . Heart disease Mother     CAD  . Leukemia Father     Vitals:   02/17/16 1121  BP: (!) 101/59  Pulse: 73  SpO2: 100%  Weight: 175 lb 2 oz (79.4 kg)   Wt Readings from Last 3 Encounters:  02/17/16 175 lb 2 oz (79.4 kg)  02/08/16 168 lb 8 oz (76.4 kg)  02/02/16 186 lb (84.4 kg)     PHYSICAL EXAM: General:  Elderly appearing. NAD. Wife present.   HEENT: normal Neck: supple. JVP ~10 cm with prominent CV wave.. Carotids 2+ bilat; no bruits. No thyromegaly or nodule noted.  Cor: PMI nondisplaced. RRR. No rubs. 3/6 MR.  No S3. Lungs: Clear.  Abdomen: soft, NT, ND, no HSM. No bruits or masses. +BS  Extremities:  no cyanosis, clubbing, rash.  R and LLE 1+ edema to knees bilaterally.  Neuro: alert & oriented x 3, cranial nerves grossly intact. moves all 4 extremities w/o difficulty. Affect pleasant.   ASSESSMENT & PLAN: 1. Chronic biventricular CHF: Has St Jude ICD with Corevue, Echo 10/08/15 LVEF 15-20%, severe RV dilation and decreased function, severe TR.  Ischemic cardiomyopathy.  Now NYHA class III.   - Volume status elevated. Increase torsemide to  60 mg twice a day.  Continue metolazone to 2.5 mg every Wednesday and Saturday with extra 20 meq potassium on metolazone days.   Increase potassium to 40 meq twice daily  - No bb with low output.   - Hold off on spironolactone and lisinopril for now with elevated creatinine. - BMET today 2. CAD s/p CABG: No CP. On coumadin so no aspirin. Add 40 mg atorvastatin.  No CP.  3. ETOH abuse:  - Denies ETOH use.   4. Hx of CVA- on coumadin.  5. Subdural hematoma: S/P evacuation hematoma on 11/15/15. 6. CKD Stage III:  - BMET today  7. OSA:  - Intolerant to CPAP. 8. SVT- Recent hospitalization started on amio 200 mg twice daily.  He was only taking 100 mg daily. Today I have asked him to take 200 mg daily.   Met with Palliative during recent hospitalization. He confirms he would like everything done in the event he has a cardiac or respiratory arrest. He poor insight related to HF.  Follow up next week to reassess volume status.   Darnell Level NP-C  02/17/2016

## 2016-02-17 NOTE — Patient Instructions (Signed)
Change Amiodarone to 200 mg daily Change Potassium to 40 meq (2 tabs) twice a day Change torsemide to 60 mg (3 tabs) twice a day  Your physician recommends that you schedule a follow-up appointment in: 1 week with andy tillery pa-c  Do the following things EVERYDAY: 1) Weigh yourself in the morning before breakfast. Write it down and keep it in a log. 2) Take your medicines as prescribed 3) Eat low salt foods-Limit salt (sodium) to 2000 mg per day.  4) Stay as active as you can everyday 5) Limit all fluids for the day to less than 2 liters

## 2016-02-17 NOTE — Telephone Encounter (Signed)
-----   Message from Conrad Cordele, NP sent at 02/17/2016  1:28 PM EDT ----- Please call ask him to start taking 40 mg atorvastatin daily  Thanks#

## 2016-02-17 NOTE — Progress Notes (Signed)
Advanced Heart Failure Medication Review by a Pharmacist  Does the patient  feel that his/her medications are working for him/her?  yes  Has the patient been experiencing any side effects to the medications prescribed?  no  Does the patient measure his/her own blood pressure or blood glucose at home?  no   Does the patient have any problems obtaining medications due to transportation or finances?   no  Understanding of regimen: good Understanding of indications: good Potential of compliance: good Patient understands to avoid NSAIDs. Patient understands to avoid decongestants.  Issues to address at subsequent visits: None   Pharmacist comments:  Mr. Biagioni is a pleasant 70 yo M presenting with his wife and without a medication list. He reports good compliance with his regimen but thinks he is only taking torsemide 40 mg BID instead of 60 mg BID. He did not have any specific medication-related questions or concerns for me at this time.   [name]. Velva Harman, PharmD, BCPS, CPP Clinical Pharmacist Pager: [phone] Phone: [phone] 02/17/2016 11:32 AM      Time with patient: 10 minutes Preparation and documentation time: 2 minutes Total time: 12 minutes

## 2016-02-21 DIAGNOSIS — I255 Ischemic cardiomyopathy: Secondary | ICD-10-CM | POA: Diagnosis not present

## 2016-02-21 DIAGNOSIS — I5043 Acute on chronic combined systolic (congestive) and diastolic (congestive) heart failure: Secondary | ICD-10-CM | POA: Diagnosis not present

## 2016-02-21 DIAGNOSIS — K7460 Unspecified cirrhosis of liver: Secondary | ICD-10-CM | POA: Diagnosis not present

## 2016-02-21 DIAGNOSIS — I130 Hypertensive heart and chronic kidney disease with heart failure and stage 1 through stage 4 chronic kidney disease, or unspecified chronic kidney disease: Secondary | ICD-10-CM | POA: Diagnosis not present

## 2016-02-21 DIAGNOSIS — N184 Chronic kidney disease, stage 4 (severe): Secondary | ICD-10-CM | POA: Diagnosis not present

## 2016-02-21 DIAGNOSIS — I2510 Atherosclerotic heart disease of native coronary artery without angina pectoris: Secondary | ICD-10-CM | POA: Diagnosis not present

## 2016-02-23 DIAGNOSIS — N184 Chronic kidney disease, stage 4 (severe): Secondary | ICD-10-CM | POA: Diagnosis not present

## 2016-02-23 DIAGNOSIS — I2510 Atherosclerotic heart disease of native coronary artery without angina pectoris: Secondary | ICD-10-CM | POA: Diagnosis not present

## 2016-02-23 DIAGNOSIS — K7460 Unspecified cirrhosis of liver: Secondary | ICD-10-CM | POA: Diagnosis not present

## 2016-02-23 DIAGNOSIS — I130 Hypertensive heart and chronic kidney disease with heart failure and stage 1 through stage 4 chronic kidney disease, or unspecified chronic kidney disease: Secondary | ICD-10-CM | POA: Diagnosis not present

## 2016-02-23 DIAGNOSIS — I5043 Acute on chronic combined systolic (congestive) and diastolic (congestive) heart failure: Secondary | ICD-10-CM | POA: Diagnosis not present

## 2016-02-23 DIAGNOSIS — I255 Ischemic cardiomyopathy: Secondary | ICD-10-CM | POA: Diagnosis not present

## 2016-02-24 ENCOUNTER — Ambulatory Visit (HOSPITAL_COMMUNITY)
Admission: RE | Admit: 2016-02-24 | Discharge: 2016-02-24 | Disposition: A | Discharge status: Home / Self Care | Payer: Medicare Other | Source: Ambulatory Visit | Attending: Cardiology | Admitting: Cardiology

## 2016-02-24 ENCOUNTER — Encounter (HOSPITAL_COMMUNITY): Payer: Self-pay

## 2016-02-24 ENCOUNTER — Encounter: Payer: Self-pay | Admitting: Internal Medicine

## 2016-02-24 ENCOUNTER — Ambulatory Visit (INDEPENDENT_AMBULATORY_CARE_PROVIDER_SITE_OTHER): Payer: Medicare Other | Admitting: *Deleted

## 2016-02-24 VITALS — BP 92/60 | HR 86 | Wt 176.4 lb

## 2016-02-24 DIAGNOSIS — I130 Hypertensive heart and chronic kidney disease with heart failure and stage 1 through stage 4 chronic kidney disease, or unspecified chronic kidney disease: Secondary | ICD-10-CM | POA: Diagnosis not present

## 2016-02-24 DIAGNOSIS — I255 Ischemic cardiomyopathy: Secondary | ICD-10-CM | POA: Diagnosis not present

## 2016-02-24 DIAGNOSIS — I471 Supraventricular tachycardia, unspecified: Secondary | ICD-10-CM

## 2016-02-24 DIAGNOSIS — Z9119 Patient's noncompliance with other medical treatment and regimen: Secondary | ICD-10-CM | POA: Insufficient documentation

## 2016-02-24 DIAGNOSIS — G4730 Sleep apnea, unspecified: Secondary | ICD-10-CM

## 2016-02-24 DIAGNOSIS — E785 Hyperlipidemia, unspecified: Secondary | ICD-10-CM | POA: Diagnosis not present

## 2016-02-24 DIAGNOSIS — Z7901 Long term (current) use of anticoagulants: Secondary | ICD-10-CM | POA: Insufficient documentation

## 2016-02-24 DIAGNOSIS — I4891 Unspecified atrial fibrillation: Secondary | ICD-10-CM

## 2016-02-24 DIAGNOSIS — Z87891 Personal history of nicotine dependence: Secondary | ICD-10-CM | POA: Diagnosis not present

## 2016-02-24 DIAGNOSIS — G4733 Obstructive sleep apnea (adult) (pediatric): Secondary | ICD-10-CM | POA: Diagnosis not present

## 2016-02-24 DIAGNOSIS — Z5181 Encounter for therapeutic drug level monitoring: Secondary | ICD-10-CM

## 2016-02-24 DIAGNOSIS — Z8673 Personal history of transient ischemic attack (TIA), and cerebral infarction without residual deficits: Secondary | ICD-10-CM | POA: Insufficient documentation

## 2016-02-24 DIAGNOSIS — I10 Essential (primary) hypertension: Secondary | ICD-10-CM | POA: Diagnosis not present

## 2016-02-24 DIAGNOSIS — N183 Chronic kidney disease, stage 3 unspecified: Secondary | ICD-10-CM

## 2016-02-24 DIAGNOSIS — Z951 Presence of aortocoronary bypass graft: Secondary | ICD-10-CM | POA: Insufficient documentation

## 2016-02-24 DIAGNOSIS — Z79899 Other long term (current) drug therapy: Secondary | ICD-10-CM | POA: Diagnosis not present

## 2016-02-24 DIAGNOSIS — I2510 Atherosclerotic heart disease of native coronary artery without angina pectoris: Secondary | ICD-10-CM

## 2016-02-24 DIAGNOSIS — I6200 Nontraumatic subdural hemorrhage, unspecified: Secondary | ICD-10-CM | POA: Diagnosis not present

## 2016-02-24 DIAGNOSIS — I5042 Chronic combined systolic (congestive) and diastolic (congestive) heart failure: Secondary | ICD-10-CM

## 2016-02-24 LAB — BRAIN NATRIURETIC PEPTIDE: B Natriuretic Peptide: 314.7 pg/mL — ABNORMAL HIGH (ref 0.0–100.0)

## 2016-02-24 LAB — BASIC METABOLIC PANEL
ANION GAP: 12 (ref 5–15)
BUN: 78 mg/dL — ABNORMAL HIGH (ref 6–20)
CALCIUM: 9.7 mg/dL (ref 8.9–10.3)
CO2: 22 mmol/L (ref 22–32)
Chloride: 96 mmol/L — ABNORMAL LOW (ref 101–111)
Creatinine, Ser: 3.25 mg/dL — ABNORMAL HIGH (ref 0.61–1.24)
GFR, EST AFRICAN AMERICAN: 21 mL/min — AB (ref >60)
GFR, EST NON AFRICAN AMERICAN: 18 mL/min — AB (ref >60)
GLUCOSE: 193 mg/dL — AB (ref 65–99)
Potassium: 5.4 mmol/L — ABNORMAL HIGH (ref 3.5–5.1)
SODIUM: 130 mmol/L — AB (ref 135–145)

## 2016-02-24 LAB — POCT INR: INR: 1.5

## 2016-02-24 MED ORDER — POTASSIUM CHLORIDE CRYS ER 20 MEQ PO TBCR: 20.0000 meq | EXTENDED_RELEASE_TABLET | Freq: Two times a day (BID) | ORAL | 6 refills | Status: DC

## 2016-02-24 NOTE — Patient Instructions (Signed)
DECREASE Potassium to 20 mg (1 tab) twice daily.  Routine lab work today. Will notify you of abnormal results, otherwise no news is good news!  Follow up 2 weeks.  Do the following things EVERYDAY: 1) Weigh yourself in the morning before breakfast. Write it down and keep it in a log. 2) Take your medicines as prescribed 3) Eat low salt foods-Limit salt (sodium) to 2000 mg per day.  4) Stay as active as you can everyday 5) Limit all fluids for the day to less than 2 liters

## 2016-02-24 NOTE — Progress Notes (Signed)
Patient ID: Rick [name], male   DOB: 11-24-1945, 70 y.o.   MRN: [id number]    Advanced Heart Failure Clinic Note   Referring Physician: Dr Harl Bowie  Primary Care: Wende Neighbors, MD Primary Cardiologist: Dr Harl Bowie  HPI: Rick [name] is a 70 y.o. male with history of ischemic cardiomyopathy status post CABG in 2003 as well as previous stenting. Most recent 2-D echo in 2016 EF 25% with restrictive diastolic dysfunction, moderate RV dysfunction PAS P 64, status post ICD followed by Dr. Rayann Heman with normal check in 02/2015. In June 2017 had subdural hema  Pt admitted 5/4 - 10/13/15 with recurrent volume overload in setting of dietary non-compliance. Echo during that admission showed EF worsening to 15-20% from 25%.  Seen in Dr. Harl Bowie office 10/20/15. Mild volume overload with dietary indiscretion. Torsemide increased to 60/40 mg. Weight at that visit 159 lbs  Admitted 6/10 through 11/20/15 with subdural hematoma and heart failure. Had evacuation of subdural hematoma. Required short term inotropes.  He was discharged off bb and ace. Discharge weight was 147 pounds.   Admitted 8/31 through 02/08/16 with marked volume overload. Diuresed with IV lasix transitioned to torsemide 60 mg tiwce a day + metolazone . No BB with low output. Had episodes of SVT so he was started on amio 200 mg tiwce daily. Met with Pallitative for GOC. Plans to continue full support. Discharge weight was 168 pounds.   He presents today for 1 week follow up.  At visit last week was taking too little amiodarone and torsemide, both increased. He is up 1 lb from that visit, though has had breakfast and lunch. Just ate at [employer].  Had gravy and rice and salad. He states he has been taking torsemide 60 mg BID, but also taking potassium 60 meq. BP low today.  Has been having some dizziness.  Still eating watermelon most days. Drinking well over 2 L a day. Drinks 2 32 oz cups fluid, mostly soda and tea. Ambulates with a cane. No BRBPR or  Melena on coumadin. Followed by [facility] [place]. Lives with his wife. States he has stopped drinking alcohol.   Corevue: Thoracic impedence above threshold with increase in torsemide last week, has mild down trend.  No VT/VF.   Echo 10/08/15 LVEF 15-20%, Severe LV dilation, Severe hypokinesis with areas of akinesis, Trivial AI, Mild MR, Mod LAE, Severe RV dilation, Severe TR, PA peak pressure 36 mm Hg Echo 03/04/15 LVEF 25%,  Severe hypokinesis, Trivial AI, Mild MR, RV mod dilated and mod reduced, Severe RAE, Mod TR, PA peak pressure 64 mm Hg.   Labs (7/17): K 4.1, creatinine 1.76, BNP 654 Labs 12/16/2015: K 2.9 Creatinine 2.04  Labs (8/17): K 3.2, creatinine 1.65, BNP 1572 Labs (02/08/2016) K 4.4 Creatinine 1.71    Past Medical History:  Diagnosis Date  . AICD (automatic cardioverter/defibrillator) present   . Anxiety   . CAD (coronary artery disease)   . CHF (congestive heart failure) ([place])   . DOE (dyspnea on exertion)   . Essential hypertension   . Family history of adverse reaction to anesthesia    "sister would go into seizures"  . Hyperlipidemia   . Ischemic cardiomyopathy    EF 20% by echo 2013  . Myocardial infarction acute ([place]) 2003  . 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 Other pulmonary embolism and infarction    On coumadin  . Pulmonary hypertension (HCC)    56 mm Hg  . Sleep  apnea    "tried machine a couple nights; couldn't do it" (11/03/2015)  . Stroke [facility])    left side facial droop since; don't know when it was" (11/03/2015)  . Tricuspid regurgitation    Severe    Current Outpatient Prescriptions  Medication Sig Dispense Refill  . amiodarone (PACERONE) 100 MG tablet Take 2 tablets (200 mg total) by mouth daily. 60 tablet 3  . atorvastatin (LIPITOR) 40 MG tablet Take 1 tablet (40 mg total) by mouth daily. 90 tablet 3  . docusate sodium (COLACE) 100 MG capsule Take 100 mg by mouth 2 (two) times daily as needed for mild constipation.    .  ferrous sulfate 325 (65 FE) MG tablet Take 325 mg by mouth daily with breakfast. Reported on 12/16/2015    . magnesium oxide (MAG-OX) 400 MG tablet Take 1 tablet (400 mg total) by mouth daily. 30 tablet 3  . metolazone (ZAROXOLYN) 2.5 MG tablet Take 1 tablet (2.5 mg total) by mouth 2 (two) times a week. Every Wed and Sat 10 tablet 3  . oxyCODONE (OXY IR/ROXICODONE) 5 MG immediate release tablet Take 1 tablet (5 mg total) by mouth every 4 (four) hours as needed for moderate pain. 50 tablet 0  . polyethylene glycol (MIRALAX / GLYCOLAX) packet Take 17 g by mouth daily as needed for mild constipation. Reported on 12/09/2015    . potassium chloride SA (K-DUR,KLOR-CON) 20 MEQ tablet Take 2 tablets (40 mEq total) by mouth 2 (two) times daily. 120 tablet 6  . torsemide (DEMADEX) 20 MG tablet Take 3 tablets (60 mg total) by mouth 2 (two) times daily. 180 tablet 3  . warfarin (COUMADIN) 3 MG tablet Take 3 mg by mouth daily.     No current facility-administered medications for this encounter.    No Known Allergies  Social History   Social History  . Marital status: Married    Spouse name: N/A  . Number of children: 4  . Years of education: N/A   Occupational History  . Disabled.    Social History Main Topics  . Smoking status: Former Smoker    Packs/day: 0.50    Years: 38.00    Types: Cigarettes    Start date: 12/30/1956    Quit date: 06/06/1995  . Smokeless tobacco: Never Used  . Alcohol use 1.8 oz/week    3 Cans of beer per week     Comment: 11/03/2015 "Quit from [id number]; weaning myself off again"  . Drug use: No  . Sexual activity: Not Currently   Other Topics Concern  . Not on file   Social History Narrative   Lives with wife and brother in [place] and grandson in [place].        Family History  Problem Relation Age of Onset  . Heart disease Mother     CAD  . Leukemia Father     Vitals:   02/24/16 1413  BP: 92/60  Pulse: 86  SpO2: 98%  Weight: 176 lb 6.4 oz (80 kg)     Wt Readings from Last 3 Encounters:  02/24/16 176 lb 6.4 oz (80 kg)  02/17/16 175 lb 2 oz (79.4 kg)  02/08/16 168 lb 8 oz (76.4 kg)     PHYSICAL EXAM: General:  Elderly appearing. NAD. Wife present.   HEENT: normal Neck: supple. JVP 8-9 cm with prominent CV wave. Carotids 2+ bilat; no bruits. No thyromegaly or nodule noted.  Cor: PMI nondisplaced. RRR. No rubs. 3/6 MR.  No S3.  Lungs: CTAB, normal effort Abdomen: soft, NT, ND, no HSM. No bruits or masses. +BS  Extremities: no cyanosis, clubbing, rash.  Trace to 1+ ankle edema.  Neuro: alert & oriented x 3, cranial nerves grossly intact. moves all 4 extremities w/o difficulty. Affect pleasant.   ASSESSMENT & PLAN: 1. Chronic biventricular CHF: Has St Jude ICD with Corevue, Echo 10/08/15 LVEF 15-20%, severe RV dilation and decreased function, severe TR.  Ischemic cardiomyopathy.  Now NYHA class III.   - Volume status elevated.  - Continue torsemide 60 mg twice a day. BMET - Continue metolazone 2.5 mg Wednesday and Saturday with extra 20 meq potassium - Decrease potassium to 20 meq twice daily as previously directed. Will address further based on BMET.  - No bb with low output.   - Hold off on spironolactone and lisinopril for now with elevated creatinine. - BMET today 2. CAD s/p CABG: No CP. On coumadin so no aspirin. - Continue 40 mg atorvastatin.  No CP.  3. ETOH abuse:  - Denies ETOH use.   4. Hx of CVA- on coumadin.  5. Subdural hematoma: S/P evacuation hematoma on 11/15/15. 6. CKD Stage III:  - BMET today  7. OSA:  - Intolerant to CPAP. 8. SVT - Continue amio 200 mg BID.   9. Goals of Care - Met with palliative during recent hospitalization Remains Full Code.  - He has poor insight into his HF.   He is stable, though tenuous today. Volume status stable per corevue, with trend towards overload in setting of continued diet and fluid non-compliance.  He has been taking potassium 60 meq BID as opposed to 20 meq BID.  I have  instructed him to hold his potassium tonight, and that we would call and update his regimen this evening or tomorrow am.   BMET and BNP today.  Will follow up closely within 10-14 days.  Satira Mccallum Tillery PA-C  02/24/2016  Total time spent > 25 minutes. Over half that spent discussing the above.

## 2016-02-25 ENCOUNTER — Telehealth (HOSPITAL_COMMUNITY): Payer: Self-pay | Admitting: Cardiology

## 2016-02-25 DIAGNOSIS — N184 Chronic kidney disease, stage 4 (severe): Secondary | ICD-10-CM | POA: Diagnosis not present

## 2016-02-25 DIAGNOSIS — K7460 Unspecified cirrhosis of liver: Secondary | ICD-10-CM | POA: Diagnosis not present

## 2016-02-25 DIAGNOSIS — I130 Hypertensive heart and chronic kidney disease with heart failure and stage 1 through stage 4 chronic kidney disease, or unspecified chronic kidney disease: Secondary | ICD-10-CM | POA: Diagnosis not present

## 2016-02-25 DIAGNOSIS — I255 Ischemic cardiomyopathy: Secondary | ICD-10-CM | POA: Diagnosis not present

## 2016-02-25 DIAGNOSIS — I5043 Acute on chronic combined systolic (congestive) and diastolic (congestive) heart failure: Secondary | ICD-10-CM | POA: Diagnosis not present

## 2016-02-25 DIAGNOSIS — I509 Heart failure, unspecified: Secondary | ICD-10-CM

## 2016-02-25 DIAGNOSIS — I2510 Atherosclerotic heart disease of native coronary artery without angina pectoris: Secondary | ICD-10-CM | POA: Diagnosis not present

## 2016-02-25 MED ORDER — TORSEMIDE 20 MG PO TABS: 40.0000 mg | ORAL_TABLET | Freq: Two times a day (BID) | ORAL | 3 refills | Status: DC

## 2016-02-25 NOTE — Telephone Encounter (Signed)
-----   Message from Shirley Friar, PA-C sent at 02/24/2016  4:18 PM EDT ----- Creatinine doubled.  Needs to hold metolazone, torsemide, and potassium. Restart torsemide 40 mg BID Sunday with 20 meq potassium BID. No metolazone for now.  Needs BMET Monday. Will have to fax and have done in [place] (Lives an hour away).    If any spots have opened for for next week, please put him there. (Thurs/Friday)   Otherwise keep close follow up for following week, that Monday or Tuesday.    Will forward to Dr. Haroldine Laws as well.  Pt has very poor prognosis. (was recently on mirlinone)  Legrand Como "Oda Kilts, PA-C 02/24/2016 4:17 PM

## 2016-02-25 NOTE — Telephone Encounter (Signed)
Patient aware and patient wife aware. Voiced understanding  Will have labs repeated at [facility] 9/25. [place] for patient to keep appt on 10/5

## 2016-02-28 NOTE — Telephone Encounter (Signed)
Addressed at [facility] 9/21, pt had not started however was advised to pick up meds and start asap

## 2016-02-29 ENCOUNTER — Other Ambulatory Visit (HOSPITAL_COMMUNITY): Payer: Self-pay

## 2016-02-29 DIAGNOSIS — I130 Hypertensive heart and chronic kidney disease with heart failure and stage 1 through stage 4 chronic kidney disease, or unspecified chronic kidney disease: Secondary | ICD-10-CM | POA: Diagnosis not present

## 2016-02-29 DIAGNOSIS — I2510 Atherosclerotic heart disease of native coronary artery without angina pectoris: Secondary | ICD-10-CM | POA: Diagnosis not present

## 2016-02-29 DIAGNOSIS — K7460 Unspecified cirrhosis of liver: Secondary | ICD-10-CM | POA: Diagnosis not present

## 2016-02-29 DIAGNOSIS — I5043 Acute on chronic combined systolic (congestive) and diastolic (congestive) heart failure: Secondary | ICD-10-CM | POA: Diagnosis not present

## 2016-02-29 DIAGNOSIS — I255 Ischemic cardiomyopathy: Secondary | ICD-10-CM | POA: Diagnosis not present

## 2016-02-29 DIAGNOSIS — N184 Chronic kidney disease, stage 4 (severe): Secondary | ICD-10-CM | POA: Diagnosis not present

## 2016-03-03 DIAGNOSIS — N184 Chronic kidney disease, stage 4 (severe): Secondary | ICD-10-CM | POA: Diagnosis not present

## 2016-03-03 DIAGNOSIS — I2510 Atherosclerotic heart disease of native coronary artery without angina pectoris: Secondary | ICD-10-CM | POA: Diagnosis not present

## 2016-03-03 DIAGNOSIS — I130 Hypertensive heart and chronic kidney disease with heart failure and stage 1 through stage 4 chronic kidney disease, or unspecified chronic kidney disease: Secondary | ICD-10-CM | POA: Diagnosis not present

## 2016-03-03 DIAGNOSIS — K7460 Unspecified cirrhosis of liver: Secondary | ICD-10-CM | POA: Diagnosis not present

## 2016-03-03 DIAGNOSIS — I5043 Acute on chronic combined systolic (congestive) and diastolic (congestive) heart failure: Secondary | ICD-10-CM | POA: Diagnosis not present

## 2016-03-03 DIAGNOSIS — I255 Ischemic cardiomyopathy: Secondary | ICD-10-CM | POA: Diagnosis not present

## 2016-03-06 DIAGNOSIS — I509 Heart failure, unspecified: Secondary | ICD-10-CM | POA: Diagnosis not present

## 2016-03-07 DIAGNOSIS — I5043 Acute on chronic combined systolic (congestive) and diastolic (congestive) heart failure: Secondary | ICD-10-CM | POA: Diagnosis not present

## 2016-03-07 DIAGNOSIS — N184 Chronic kidney disease, stage 4 (severe): Secondary | ICD-10-CM | POA: Diagnosis not present

## 2016-03-07 DIAGNOSIS — I130 Hypertensive heart and chronic kidney disease with heart failure and stage 1 through stage 4 chronic kidney disease, or unspecified chronic kidney disease: Secondary | ICD-10-CM | POA: Diagnosis not present

## 2016-03-07 DIAGNOSIS — I2510 Atherosclerotic heart disease of native coronary artery without angina pectoris: Secondary | ICD-10-CM | POA: Diagnosis not present

## 2016-03-07 DIAGNOSIS — I255 Ischemic cardiomyopathy: Secondary | ICD-10-CM | POA: Diagnosis not present

## 2016-03-07 DIAGNOSIS — K7460 Unspecified cirrhosis of liver: Secondary | ICD-10-CM | POA: Diagnosis not present

## 2016-03-09 ENCOUNTER — Encounter: Payer: Self-pay | Admitting: Internal Medicine

## 2016-03-09 ENCOUNTER — Ambulatory Visit (HOSPITAL_COMMUNITY)
Admission: RE | Admit: 2016-03-09 | Discharge: 2016-03-09 | Disposition: A | Discharge status: Home / Self Care | Payer: Medicare Other | Source: Ambulatory Visit | Attending: Internal Medicine | Admitting: Internal Medicine

## 2016-03-09 ENCOUNTER — Ambulatory Visit (INDEPENDENT_AMBULATORY_CARE_PROVIDER_SITE_OTHER): Payer: Medicare Other | Admitting: *Deleted

## 2016-03-09 VITALS — BP 110/62 | HR 59 | Wt 182.2 lb

## 2016-03-09 DIAGNOSIS — Z5181 Encounter for therapeutic drug level monitoring: Secondary | ICD-10-CM

## 2016-03-09 DIAGNOSIS — I130 Hypertensive heart and chronic kidney disease with heart failure and stage 1 through stage 4 chronic kidney disease, or unspecified chronic kidney disease: Secondary | ICD-10-CM | POA: Insufficient documentation

## 2016-03-09 DIAGNOSIS — I2510 Atherosclerotic heart disease of native coronary artery without angina pectoris: Secondary | ICD-10-CM | POA: Insufficient documentation

## 2016-03-09 DIAGNOSIS — I5042 Chronic combined systolic (congestive) and diastolic (congestive) heart failure: Secondary | ICD-10-CM | POA: Diagnosis not present

## 2016-03-09 DIAGNOSIS — Z95810 Presence of automatic (implantable) cardiac defibrillator: Secondary | ICD-10-CM | POA: Insufficient documentation

## 2016-03-09 DIAGNOSIS — I509 Heart failure, unspecified: Secondary | ICD-10-CM | POA: Insufficient documentation

## 2016-03-09 DIAGNOSIS — G4730 Sleep apnea, unspecified: Secondary | ICD-10-CM

## 2016-03-09 DIAGNOSIS — I471 Supraventricular tachycardia: Secondary | ICD-10-CM | POA: Insufficient documentation

## 2016-03-09 DIAGNOSIS — Z79899 Other long term (current) drug therapy: Secondary | ICD-10-CM | POA: Diagnosis not present

## 2016-03-09 DIAGNOSIS — Z951 Presence of aortocoronary bypass graft: Secondary | ICD-10-CM | POA: Insufficient documentation

## 2016-03-09 DIAGNOSIS — N183 Chronic kidney disease, stage 3 unspecified: Secondary | ICD-10-CM

## 2016-03-09 DIAGNOSIS — I4891 Unspecified atrial fibrillation: Secondary | ICD-10-CM

## 2016-03-09 DIAGNOSIS — N179 Acute kidney failure, unspecified: Secondary | ICD-10-CM | POA: Insufficient documentation

## 2016-03-09 DIAGNOSIS — I255 Ischemic cardiomyopathy: Secondary | ICD-10-CM | POA: Insufficient documentation

## 2016-03-09 DIAGNOSIS — G4733 Obstructive sleep apnea (adult) (pediatric): Secondary | ICD-10-CM | POA: Diagnosis not present

## 2016-03-09 DIAGNOSIS — Z8673 Personal history of transient ischemic attack (TIA), and cerebral infarction without residual deficits: Secondary | ICD-10-CM | POA: Diagnosis not present

## 2016-03-09 DIAGNOSIS — I10 Essential (primary) hypertension: Secondary | ICD-10-CM

## 2016-03-09 DIAGNOSIS — Z7982 Long term (current) use of aspirin: Secondary | ICD-10-CM | POA: Diagnosis not present

## 2016-03-09 DIAGNOSIS — Z87891 Personal history of nicotine dependence: Secondary | ICD-10-CM | POA: Insufficient documentation

## 2016-03-09 DIAGNOSIS — Z7901 Long term (current) use of anticoagulants: Secondary | ICD-10-CM | POA: Diagnosis not present

## 2016-03-09 LAB — BASIC METABOLIC PANEL
ANION GAP: 8 (ref 5–15)
BUN: 27 mg/dL — ABNORMAL HIGH (ref 6–20)
CALCIUM: 9.6 mg/dL (ref 8.9–10.3)
CHLORIDE: 103 mmol/L (ref 101–111)
CO2: 27 mmol/L (ref 22–32)
Creatinine, Ser: 1.77 mg/dL — ABNORMAL HIGH (ref 0.61–1.24)
GFR calc non Af Amer: 37 mL/min — ABNORMAL LOW (ref >60)
GFR, EST AFRICAN AMERICAN: 43 mL/min — AB (ref >60)
GLUCOSE: 122 mg/dL — AB (ref 65–99)
POTASSIUM: 4.3 mmol/L (ref 3.5–5.1)
Sodium: 138 mmol/L (ref 135–145)

## 2016-03-09 LAB — POCT INR: INR: 2

## 2016-03-09 MED ORDER — METOLAZONE 2.5 MG PO TABS: 2.5000 mg | ORAL_TABLET | ORAL | 3 refills | Status: DC

## 2016-03-09 MED ORDER — POTASSIUM CHLORIDE CRYS ER 20 MEQ PO TBCR: 20.0000 meq | EXTENDED_RELEASE_TABLET | Freq: Two times a day (BID) | ORAL | 6 refills | Status: DC

## 2016-03-09 NOTE — Patient Instructions (Addendum)
Please take an extra tab (20 meq) of Potassium on Wed and Sat when you take Metolazone   Your physician recommends that you schedule a follow-up appointment in: 2 weeks

## 2016-03-09 NOTE — Progress Notes (Signed)
REDS VEST READING= 37 CHEST RULER=16  VEST FITTING TASKS: POSTURE=standing HEIGHT MARKER=tall CENTER STRIP=aligned  COMMENTS:n/a

## 2016-03-09 NOTE — Progress Notes (Signed)
Patient ID: Rick [name], male   DOB: 12-28-1945, 70 y.o.   MRN: [id number]    Advanced Heart Failure Clinic Note   Referring Physician: Dr Harl Bowie  Primary Care: Wende Neighbors, MD Primary Cardiologist: Dr Harl Bowie  HPI: Rick [name] is a 70 y.o. male with history of ischemic cardiomyopathy status post CABG in 2003 as well as previous stenting. Most recent 2-D echo in 2016 EF 25% with restrictive diastolic dysfunction, moderate RV dysfunction PAS P 64, status post ICD followed by Dr. Rayann Heman with normal check in 02/2015. In June 2017 had subdural hema  Pt admitted 5/4 - 10/13/15 with recurrent volume overload in setting of dietary non-compliance. Echo during that admission showed EF worsening to 15-20% from 25%.  Seen in Dr. Harl Bowie office 10/20/15. Mild volume overload with dietary indiscretion. Torsemide increased to 60/40 mg. Weight at that visit 159 lbs  Admitted 6/10 through 11/20/15 with subdural hematoma and heart failure. Had evacuation of subdural hematoma. Required short term inotropes.  He was discharged off bb and ace. Discharge weight was 147 pounds.   Admitted 8/31 through 02/08/16 with marked volume overload. Diuresed with IV lasix transitioned to torsemide 60 mg tiwce a day + metolazone . No BB with low output. Had episodes of SVT so he was started on amio 200 mg tiwce daily. Met with Pallitative for GOC. Plans to continue full support. Discharge weight was 168 pounds.   He presents today for 2 week follow up. Up 6 lbs from last visit.  Weight at home 174 lbs, usually closer to 170. Didn't get labs drawn as ordered, states he had [facility] this [facility]. Has been torsemide 20 mg BID instead of 40 mg BID as directed. Taking potassium 20 meq BID. States he has had less lightheadedness or dizziness. Has been able to walk. States he had been watching his fluid but over the past few days had been very thirsty so drinking > 2L. No melena or BRBPR. Followed by [facility] at  [place]. Lives with his wife. No alcohol.   Corevue:    Echo 10/08/15 LVEF 15-20%, Severe LV dilation, Severe hypokinesis with areas of akinesis, Trivial AI, Mild MR, Mod LAE, Severe RV dilation, Severe TR, PA peak pressure 36 mm Hg Echo 03/04/15 LVEF 25%,  Severe hypokinesis, Trivial AI, Mild MR, RV mod dilated and mod reduced, Severe RAE, Mod TR, PA peak pressure 64 mm Hg.   Labs (7/17): K 4.1, creatinine 1.76, BNP 654 Labs 12/16/2015: K 2.9 Creatinine 2.04  Labs (8/17): K 3.2, creatinine 1.65, BNP 1572 Labs (02/08/2016) K 4.4 Creatinine 1.71    Past Medical History:  Diagnosis Date  . AICD (automatic cardioverter/defibrillator) present   . Anxiety   . CAD (coronary artery disease)   . CHF (congestive heart failure) ([place])   . DOE (dyspnea on exertion)   . Essential hypertension   . Family history of adverse reaction to anesthesia    "sister would go into seizures"  . Hyperlipidemia   . Ischemic cardiomyopathy    EF 20% by echo 2013  . Myocardial infarction acute 2003  . 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 Other pulmonary embolism and infarction ([place])    On coumadin  . Pulmonary hypertension    56 mm Hg  . Sleep apnea    "tried machine a couple nights; couldn't do it" (11/03/2015)  . Stroke [facility])    left side facial droop since; don't know when it was" (11/03/2015)  .  Tricuspid regurgitation    Severe    Current Outpatient Prescriptions  Medication Sig Dispense Refill  . amiodarone (PACERONE) 100 MG tablet Take 2 tablets (200 mg total) by mouth daily. 60 tablet 3  . atorvastatin (LIPITOR) 40 MG tablet Take 1 tablet (40 mg total) by mouth daily. 90 tablet 3  . docusate sodium (COLACE) 100 MG capsule Take 100 mg by mouth 2 (two) times daily as needed for mild constipation.    . ferrous sulfate 325 (65 FE) MG tablet Take 325 mg by mouth daily with breakfast. Reported on 12/16/2015    . magnesium oxide (MAG-OX) 400 MG tablet Take 1 tablet (400 mg total) by mouth daily. 30  tablet 3  . oxyCODONE (OXY IR/ROXICODONE) 5 MG immediate release tablet Take 1 tablet (5 mg total) by mouth every 4 (four) hours as needed for moderate pain. 50 tablet 0  . polyethylene glycol (MIRALAX / GLYCOLAX) packet Take 17 g by mouth daily as needed for mild constipation. Reported on 12/09/2015    . potassium chloride SA (K-DUR,KLOR-CON) 20 MEQ tablet Take 1 tablet (20 mEq total) by mouth 2 (two) times daily. 60 tablet 6  . torsemide (DEMADEX) 20 MG tablet Take 20 mg by mouth 2 (two) times daily.    [name] warfarin (COUMADIN) 3 MG tablet Take 3 mg by mouth daily.     No current facility-administered medications for this encounter.    No Known Allergies  Social History   Social History  . Marital status: Married    Spouse name: N/A  . Number of children: 4  . Years of education: N/A   Occupational History  . Disabled.    Social History Main Topics  . Smoking status: Former Smoker    Packs/day: 0.50    Years: 38.00    Types: Cigarettes    Start date: 12/30/1956    Quit date: 06/06/1995  . Smokeless tobacco: Never Used  . Alcohol use 1.8 oz/week    3 Cans of beer per week     Comment: 11/03/2015 "Quit from [id number]; weaning myself off again"  . Drug use: No  . Sexual activity: Not Currently   Other Topics Concern  . Not on file   Social History Narrative   Lives with wife and brother in [place] and grandson in [place].        Family History  Problem Relation Age of Onset  . Heart disease Mother     CAD  . Leukemia Father     Vitals:   03/09/16 0935  BP: 110/62  Pulse: (!) 59  SpO2: 100%  Weight: 182 lb 4 oz (82.7 kg)   Wt Readings from Last 3 Encounters:  03/09/16 182 lb 4 oz (82.7 kg)  02/24/16 176 lb 6.4 oz (80 kg)  02/17/16 175 lb 2 oz (79.4 kg)     PHYSICAL EXAM: General:  Elderly appearing. NAD. Wife present.   HEENT: normal Neck: supple. JVP 8-9 cm with prominent CV wave. Carotids 2+ bilat; no bruits. No thyromegaly or nodule noted.  Cor: PMI  nondisplaced. RRR. No rubs. 3/6 MR.  No S3. Lungs: Mildly diminished basilar sounds.  Abdomen: soft, NT, ND, no HSM. No bruits or masses. +BS  Extremities: no cyanosis, clubbing, rash.  Trace to 1+ ankle edema.  Neuro: alert & oriented x 3, cranial nerves grossly intact. moves all 4 extremities w/o difficulty. Affect pleasant.   ASSESSMENT & PLAN: 1. Chronic biventricular CHF: Has St Jude ICD with Corevue, Echo  10/08/15 LVEF 15-20%, severe RV dilation and decreased function, severe TR.  Ischemic cardiomyopathy.  Now NYHA class III.   - Volume status mildly elevated, but stable.  ReDs VEST 37% - Per Dr. Haroldine Laws would tolerate up to 38% with his tenuous status.  - Continue torsemide 20 mg BID. BMET today with creatinine much improved.  - He has been taking metolazone still and is stable. Will continue metolazone 2.5 mg Wednesdays and Saturdays.  - Continue potassium 20 meq twice daily with extra 20 meq on metolazone days.  - No bb with low output.   - Hold off on spironolactone and lisinopril with non-compliance and recent marked AKI.  - BMET today 2. CAD s/p CABG: No CP. On coumadin so no aspirin. - Continue 40 mg atorvastatin.  No CP.  3. ETOH abuse:  - Denies ETOH use.   4. Hx of CVA- on coumadin.  5. Subdural hematoma: S/P evacuation hematoma on 11/15/15. 6. AKI on CKD Stage III:  - Resolved by BMET today  7. OSA:  - Intolerant to CPAP. 8. SVT - Continue amio 200 mg BID.   9. Goals of Care - Met with palliative during recent hospitalization Remains Full Code.  - He has poor insight into his HF.   Discussed case with Dr. Haroldine Laws. Creatinine greatly improved, and although weight up, Corevuetrending towards dry.  ReDS Vest 37%.  Will leave meds the same for now with poor medical literacy, and keep close follow up at 2 weeks.    BMET today with improvement as above.   Satira Mccallum Jazmine Heckman PA-C  03/09/2016  Total time spent > 25 minutes. Over half that spent discussing the  above.

## 2016-03-09 NOTE — Progress Notes (Signed)
Advanced Heart Failure Medication Review by a Pharmacist  Does the patient  feel that his/her medications are working for him/her?  yes  Has the patient been experiencing any side effects to the medications prescribed?  no  Does the patient measure his/her own blood pressure or blood glucose at home?  yes   Does the patient have any problems obtaining medications due to transportation or finances?   no  Understanding of regimen: good Understanding of indications: good Potential of compliance: fair Patient understands to avoid NSAIDs. Patient understands to avoid decongestants.  Issues to address at subsequent visits: None   Pharmacist comments:  Rick [name] is a pleasant 70 yo M presenting with his wife and his medication bottles. He reports fair compliance with his regimen but did admit to only taking torsemide 20 mg BID instead of prescribed 40 mg BID since he thinks he had been getting too dehydrated. He does state that that was maintaining his volume status but recently his weight has been trending up again. He did not have any specific medication-related questions or concerns for me at this time.   [name]. Velva Harman, PharmD, BCPS, CPP Clinical Pharmacist Pager: [phone] Phone: [phone] 03/09/2016 9:42 AM    Time with patient: 10 minutes Preparation and documentation time: 2 minutes Total time: 12 minutes

## 2016-03-17 ENCOUNTER — Encounter: Payer: Self-pay | Admitting: Internal Medicine

## 2016-03-17 DIAGNOSIS — K7460 Unspecified cirrhosis of liver: Secondary | ICD-10-CM | POA: Diagnosis not present

## 2016-03-17 DIAGNOSIS — N184 Chronic kidney disease, stage 4 (severe): Secondary | ICD-10-CM | POA: Diagnosis not present

## 2016-03-17 DIAGNOSIS — I5043 Acute on chronic combined systolic (congestive) and diastolic (congestive) heart failure: Secondary | ICD-10-CM | POA: Diagnosis not present

## 2016-03-17 DIAGNOSIS — I130 Hypertensive heart and chronic kidney disease with heart failure and stage 1 through stage 4 chronic kidney disease, or unspecified chronic kidney disease: Secondary | ICD-10-CM | POA: Diagnosis not present

## 2016-03-17 DIAGNOSIS — I2510 Atherosclerotic heart disease of native coronary artery without angina pectoris: Secondary | ICD-10-CM | POA: Diagnosis not present

## 2016-03-17 DIAGNOSIS — I255 Ischemic cardiomyopathy: Secondary | ICD-10-CM | POA: Diagnosis not present

## 2016-03-22 ENCOUNTER — Telehealth (HOSPITAL_COMMUNITY): Payer: Self-pay | Admitting: *Deleted

## 2016-03-22 ENCOUNTER — Telehealth: Payer: Self-pay | Admitting: Cardiovascular Disease

## 2016-03-22 DIAGNOSIS — I130 Hypertensive heart and chronic kidney disease with heart failure and stage 1 through stage 4 chronic kidney disease, or unspecified chronic kidney disease: Secondary | ICD-10-CM | POA: Diagnosis not present

## 2016-03-22 DIAGNOSIS — K7460 Unspecified cirrhosis of liver: Secondary | ICD-10-CM | POA: Diagnosis not present

## 2016-03-22 DIAGNOSIS — N184 Chronic kidney disease, stage 4 (severe): Secondary | ICD-10-CM | POA: Diagnosis not present

## 2016-03-22 DIAGNOSIS — I2510 Atherosclerotic heart disease of native coronary artery without angina pectoris: Secondary | ICD-10-CM | POA: Diagnosis not present

## 2016-03-22 DIAGNOSIS — I255 Ischemic cardiomyopathy: Secondary | ICD-10-CM | POA: Diagnosis not present

## 2016-03-22 DIAGNOSIS — I5043 Acute on chronic combined systolic (congestive) and diastolic (congestive) heart failure: Secondary | ICD-10-CM | POA: Diagnosis not present

## 2016-03-22 NOTE — Telephone Encounter (Signed)
Amedeo Gory, RN w/[facility] left a VM on triage line this afternoon stating pt's wt is up 4 lb since last week and he is more SOB.  Attempted to return call to pt for more details but got his VM, pt is sch to see [place] tomorrow at 9am and was suppose to take metolazone today.  Left pt mess to make sure to keep appt tomorrow

## 2016-03-23 ENCOUNTER — Ambulatory Visit (HOSPITAL_COMMUNITY)
Admission: RE | Admit: 2016-03-23 | Discharge: 2016-03-23 | Disposition: A | Discharge status: Home / Self Care | Payer: Medicare Other | Source: Ambulatory Visit | Attending: Cardiology | Admitting: Cardiology

## 2016-03-23 VITALS — BP 106/68 | HR 61 | Wt 184.8 lb

## 2016-03-23 DIAGNOSIS — Z8673 Personal history of transient ischemic attack (TIA), and cerebral infarction without residual deficits: Secondary | ICD-10-CM | POA: Insufficient documentation

## 2016-03-23 DIAGNOSIS — I252 Old myocardial infarction: Secondary | ICD-10-CM | POA: Diagnosis not present

## 2016-03-23 DIAGNOSIS — Z87891 Personal history of nicotine dependence: Secondary | ICD-10-CM | POA: Diagnosis not present

## 2016-03-23 DIAGNOSIS — Z95810 Presence of automatic (implantable) cardiac defibrillator: Secondary | ICD-10-CM | POA: Diagnosis not present

## 2016-03-23 DIAGNOSIS — G4733 Obstructive sleep apnea (adult) (pediatric): Secondary | ICD-10-CM | POA: Diagnosis not present

## 2016-03-23 DIAGNOSIS — N183 Chronic kidney disease, stage 3 unspecified: Secondary | ICD-10-CM

## 2016-03-23 DIAGNOSIS — I130 Hypertensive heart and chronic kidney disease with heart failure and stage 1 through stage 4 chronic kidney disease, or unspecified chronic kidney disease: Secondary | ICD-10-CM | POA: Insufficient documentation

## 2016-03-23 DIAGNOSIS — I272 Pulmonary hypertension, unspecified: Secondary | ICD-10-CM | POA: Insufficient documentation

## 2016-03-23 DIAGNOSIS — I5042 Chronic combined systolic (congestive) and diastolic (congestive) heart failure: Secondary | ICD-10-CM | POA: Diagnosis not present

## 2016-03-23 DIAGNOSIS — Z951 Presence of aortocoronary bypass graft: Secondary | ICD-10-CM | POA: Diagnosis not present

## 2016-03-23 DIAGNOSIS — I471 Supraventricular tachycardia: Secondary | ICD-10-CM | POA: Insufficient documentation

## 2016-03-23 DIAGNOSIS — Z86711 Personal history of pulmonary embolism: Secondary | ICD-10-CM | POA: Insufficient documentation

## 2016-03-23 DIAGNOSIS — I10 Essential (primary) hypertension: Secondary | ICD-10-CM | POA: Diagnosis not present

## 2016-03-23 DIAGNOSIS — Z8249 Family history of ischemic heart disease and other diseases of the circulatory system: Secondary | ICD-10-CM | POA: Insufficient documentation

## 2016-03-23 DIAGNOSIS — Z7901 Long term (current) use of anticoagulants: Secondary | ICD-10-CM | POA: Insufficient documentation

## 2016-03-23 DIAGNOSIS — E785 Hyperlipidemia, unspecified: Secondary | ICD-10-CM | POA: Insufficient documentation

## 2016-03-23 DIAGNOSIS — I6200 Nontraumatic subdural hemorrhage, unspecified: Secondary | ICD-10-CM

## 2016-03-23 DIAGNOSIS — N179 Acute kidney failure, unspecified: Secondary | ICD-10-CM | POA: Insufficient documentation

## 2016-03-23 DIAGNOSIS — G4730 Sleep apnea, unspecified: Secondary | ICD-10-CM

## 2016-03-23 DIAGNOSIS — S065X9A Traumatic subdural hemorrhage with loss of consciousness of unspecified duration, initial encounter: Secondary | ICD-10-CM

## 2016-03-23 DIAGNOSIS — Z79899 Other long term (current) drug therapy: Secondary | ICD-10-CM | POA: Diagnosis not present

## 2016-03-23 DIAGNOSIS — S065XAA Traumatic subdural hemorrhage with loss of consciousness status unknown, initial encounter: Secondary | ICD-10-CM

## 2016-03-23 DIAGNOSIS — I2510 Atherosclerotic heart disease of native coronary artery without angina pectoris: Secondary | ICD-10-CM | POA: Insufficient documentation

## 2016-03-23 LAB — BASIC METABOLIC PANEL
ANION GAP: 9 (ref 5–15)
BUN: 20 mg/dL (ref 6–20)
CALCIUM: 9.4 mg/dL (ref 8.9–10.3)
CO2: 22 mmol/L (ref 22–32)
Chloride: 107 mmol/L (ref 101–111)
Creatinine, Ser: 1.63 mg/dL — ABNORMAL HIGH (ref 0.61–1.24)
GFR, EST AFRICAN AMERICAN: 48 mL/min — AB (ref >60)
GFR, EST NON AFRICAN AMERICAN: 41 mL/min — AB (ref >60)
Glucose, Bld: 96 mg/dL (ref 65–99)
Potassium: 3.6 mmol/L (ref 3.5–5.1)
Sodium: 138 mmol/L (ref 135–145)

## 2016-03-23 LAB — BRAIN NATRIURETIC PEPTIDE: B NATRIURETIC PEPTIDE 5: 721.4 pg/mL — AB (ref 0.0–100.0)

## 2016-03-23 MED ORDER — TORSEMIDE 20 MG PO TABS: ORAL_TABLET | ORAL | 6 refills | Status: DC

## 2016-03-23 NOTE — Patient Instructions (Addendum)
INCREASE Torsemide to 40 mg (2 tabs) in am and 20 mg (1 tab) in pm.  Take Potassium 1 tablet (20 mEq total) by mouth 2 (two) times daily. Take extra tab on Wed and Sat with metolazone.  Routine lab work today. Will notify you of abnormal results, otherwise no news is good news!  Follow up 3-4 weeks with Dr. Haroldine Laws.  Do the following things EVERYDAY: 1) Weigh yourself in the morning before breakfast. Write it down and keep it in a log. 2) Take your medicines as prescribed 3) Eat low salt foods-Limit salt (sodium) to 2000 mg per day.  4) Stay as active as you can everyday 5) Limit all fluids for the day to less than 2 liters

## 2016-03-23 NOTE — Progress Notes (Signed)
Patient ID: Rick [name], male   DOB: [birth year], 70 y.o.   MRN: [id number]    Advanced Heart Failure Clinic Note   Referring Physician: Dr Harl Bowie  Primary Care: Wende Neighbors, MD Primary Cardiologist: Dr Harl Bowie Primary HF: Dr. Haroldine Laws  HPI: Rick [name] is a 70 y.o. male with history of ischemic cardiomyopathy status post CABG in 2003 as well as previous stenting. Most recent 2-D echo in 2016 EF 25% with restrictive diastolic dysfunction, moderate RV dysfunction PAS P 64, status post ICD followed by Dr. Rayann Heman with normal check in 02/2015. In June 2017 had subdural hema  Pt admitted 5/4 - 10/13/15 with recurrent volume overload in setting of dietary non-compliance. Echo during that admission showed EF worsening to 15-20% from 25%.  Seen in Dr. Harl Bowie office 10/20/15. Mild volume overload with dietary indiscretion. Torsemide increased to 60/40 mg. Weight at that visit 159 lbs  Admitted 6/10 through 11/20/15 with subdural hematoma and heart failure. Had evacuation of subdural hematoma. Required short term inotropes.  He was discharged off bb and ace. Discharge weight was 147 pounds.   Admitted 8/31 through 02/08/16 with marked volume overload. Diuresed with IV lasix transitioned to torsemide 60 mg tiwce a day + metolazone . No BB with low output. Had episodes of SVT so he was started on amio 200 mg tiwce daily. Met with Pallitative for GOC. Plans to continue full support. Discharge weight was 168 pounds.   He presents today for follow up.  Weight up 2 more lbs.  Weight at home 178. Taking torsemide 20 mg BID and metolazone twice weekly. Not taking extra potassium.  Did have some orthopnea last night.  Trying to watch fluid but continues to occasionally drink > 2L.  No melena or BRBPR.  No ETOH. Lives at home with wife. [place] comes to see him once a week.   Corevue: No VT/VF. Thoracic impedence trending down.    Echo 10/08/15 LVEF 15-20%, Severe LV dilation, Severe  hypokinesis with areas of akinesis, Trivial AI, Mild MR, Mod LAE, Severe RV dilation, Severe TR, PA peak pressure 36 mm Hg Echo 03/04/15 LVEF 25%,  Severe hypokinesis, Trivial AI, Mild MR, RV mod dilated and mod reduced, Severe RAE, Mod TR, PA peak pressure 64 mm Hg.   Labs (7/17): K 4.1, creatinine 1.76, BNP 654 Labs 12/16/2015: K 2.9 Creatinine 2.04  Labs (8/17): K 3.2, creatinine 1.65, BNP 1572 Labs (02/08/2016) K 4.4 Creatinine 1.71    Past Medical History:  Diagnosis Date  . AICD (automatic cardioverter/defibrillator) present   . Anxiety   . CAD (coronary artery disease)   . CHF (congestive heart failure) ([place])   . DOE (dyspnea on exertion)   . Essential hypertension   . Family history of adverse reaction to anesthesia    "sister would go into seizures"  . Hyperlipidemia   . Ischemic cardiomyopathy    EF 20% by echo 2013  . Myocardial infarction acute 2003  . 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 Other pulmonary embolism and infarction ([place])    On coumadin  . Pulmonary hypertension    56 mm Hg  . Sleep apnea    "tried machine a couple nights; couldn't do it" (11/03/2015)  . Stroke [facility])    left side facial droop since; don't know when it was" (11/03/2015)  . Tricuspid regurgitation    Severe    Current Outpatient Prescriptions  Medication Sig Dispense Refill  . amiodarone (PACERONE) 100 MG tablet Take  2 tablets (200 mg total) by mouth daily. 60 tablet 3  . atorvastatin (LIPITOR) 40 MG tablet Take 1 tablet (40 mg total) by mouth daily. 90 tablet 3  . ferrous sulfate 325 (65 FE) MG tablet Take 325 mg by mouth daily with breakfast. Reported on 12/16/2015    . magnesium oxide (MAG-OX) 400 MG tablet Take 1 tablet (400 mg total) by mouth daily. 30 tablet 3  . metolazone (ZAROXOLYN) 2.5 MG tablet Take 1 tablet (2.5 mg total) by mouth 2 (two) times a week. Wed and Sat 90 tablet 3  . oxyCODONE (OXY IR/ROXICODONE) 5 MG immediate release tablet Take 1 tablet (5 mg total) by mouth every 4  (four) hours as needed for moderate pain. 50 tablet 0  . potassium chloride SA (K-DUR,KLOR-CON) 20 MEQ tablet Take 1 tablet (20 mEq total) by mouth 2 (two) times daily. Take extra tab on Wed and Sat with metolazone 60 tablet 6  . torsemide (DEMADEX) 20 MG tablet Take 20 mg by mouth 2 (two) times daily.    [name] warfarin (COUMADIN) 3 MG tablet Take 3 mg by mouth daily.     No current facility-administered medications for this encounter.    No Known Allergies  Social History   Social History  . Marital status: Married    Spouse name: N/A  . Number of children: 4  . Years of education: N/A   Occupational History  . Disabled.    Social History Main Topics  . Smoking status: Former Smoker    Packs/day: 0.50    Years: 38.00    Types: Cigarettes    Start date: 12/30/1956    Quit date: 06/06/1995  . Smokeless tobacco: Never Used  . Alcohol use 1.8 oz/week    3 Cans of beer per week     Comment: 11/03/2015 "Quit from [id number]; weaning myself off again"  . Drug use: No  . Sexual activity: Not Currently   Other Topics Concern  . Not on file   Social History Narrative   Lives with wife and brother in [place] and grandson in [place].        Family History  Problem Relation Age of Onset  . Heart disease Mother     CAD  . Leukemia Father     Vitals:   03/23/16 0935  BP: 106/68  Pulse: 61  SpO2: 99%  Weight: 184 lb 12.8 oz (83.8 kg)   Wt Readings from Last 3 Encounters:  03/23/16 184 lb 12.8 oz (83.8 kg)  03/09/16 182 lb 4 oz (82.7 kg)  02/24/16 176 lb 6.4 oz (80 kg)     PHYSICAL EXAM: General:  Elderly appearing. NAD. Wife present.   HEENT: normal Neck: supple. JVP 9-10 cm with prominent CV wave. Carotids 2+ bilat; no bruits. No thyromegaly or nodule noted.  Cor: PMI nondisplaced. RRR. No rubs. 3/6 MR.  No S3. Lungs: Mildly diminished basilar sounds.  Abdomen: soft, NT, ND, no HSM. No bruits or masses. +BS  Extremities: no cyanosis, clubbing, rash.  Trace to 1+  ankle edema.  Neuro: alert & oriented x 3, cranial nerves grossly intact. moves all 4 extremities w/o difficulty. Affect pleasant.   ASSESSMENT & PLAN: 1. Chronic biventricular CHF: Has St Jude ICD with Corevue, Echo 10/08/15 LVEF 15-20%, severe RV dilation and decreased function, severe TR.  Ischemic cardiomyopathy.  Now NYHA class III.   - Volume status elevated. ReDs VEST 36%. Would tolerate up to 38% but weight continues to trend up  and JVP up along with thoracic impedence down. Drinking > 2 L.  - Increase torsemide 40 mg q am and 20 mg q pm. BMET/BNP - Continue metolazone 2.5 mg Wednesdays and Saturdays.  - Continue potassium 20 meq twice daily with extra 20 meq on metolazone days.  - No bb with low output.   - Hold off on spironolactone and lisinopril with non-compliance and recent marked AKI.  - BMET today 2. CAD s/p CABG:  - No CP. No ASA with stable disease on coumadin.  - Continue 40 mg atorvastatin.  No CP.  3. ETOH abuse:  - No ETOH use.  4. Hx of CVA - On coumadin.   5. Subdural hematoma: S/P evacuation hematoma on 11/15/15. 6. AKI on CKD Stage III:  - Resolved by BMET today  7. OSA:  - Intolerant to CPAP. 8. SVT - Continue amio 200 mg BID.   9. Goals of Care - Met with palliative during recent hospitalization Remains Full Code.  - He has poor insight into his HF.   Increase torsemide as above. Labs today. Follow up 3-4 weeks with MD  Satira Mccallum Tillery PA-C  03/23/2016   Total time spent> 25 minutes. Over half that spent discussing the above.

## 2016-03-23 NOTE — Telephone Encounter (Signed)
error 

## 2016-03-28 ENCOUNTER — Telehealth: Payer: Self-pay

## 2016-03-28 NOTE — Telephone Encounter (Signed)
Patient referred to [facility] clinic by Dr Rayann Heman.  Attempted 2nd ICM intro call to patient and left message to return call.  91 day remote transmission scheduled for 05/03/2016.  Will attempt ICM intro again at that time.

## 2016-03-29 DIAGNOSIS — I2510 Atherosclerotic heart disease of native coronary artery without angina pectoris: Secondary | ICD-10-CM | POA: Diagnosis not present

## 2016-03-29 DIAGNOSIS — N184 Chronic kidney disease, stage 4 (severe): Secondary | ICD-10-CM | POA: Diagnosis not present

## 2016-03-29 DIAGNOSIS — I130 Hypertensive heart and chronic kidney disease with heart failure and stage 1 through stage 4 chronic kidney disease, or unspecified chronic kidney disease: Secondary | ICD-10-CM | POA: Diagnosis not present

## 2016-03-29 DIAGNOSIS — I5043 Acute on chronic combined systolic (congestive) and diastolic (congestive) heart failure: Secondary | ICD-10-CM | POA: Diagnosis not present

## 2016-03-29 DIAGNOSIS — K7460 Unspecified cirrhosis of liver: Secondary | ICD-10-CM | POA: Diagnosis not present

## 2016-03-29 DIAGNOSIS — I255 Ischemic cardiomyopathy: Secondary | ICD-10-CM | POA: Diagnosis not present

## 2016-03-30 ENCOUNTER — Ambulatory Visit (INDEPENDENT_AMBULATORY_CARE_PROVIDER_SITE_OTHER): Payer: Medicare Other | Admitting: *Deleted

## 2016-03-30 DIAGNOSIS — Z5181 Encounter for therapeutic drug level monitoring: Secondary | ICD-10-CM | POA: Diagnosis not present

## 2016-03-30 DIAGNOSIS — I4891 Unspecified atrial fibrillation: Secondary | ICD-10-CM | POA: Diagnosis not present

## 2016-03-30 LAB — POCT INR: INR: 1.8

## 2016-04-05 DIAGNOSIS — I255 Ischemic cardiomyopathy: Secondary | ICD-10-CM | POA: Diagnosis not present

## 2016-04-05 DIAGNOSIS — I5043 Acute on chronic combined systolic (congestive) and diastolic (congestive) heart failure: Secondary | ICD-10-CM | POA: Diagnosis not present

## 2016-04-05 DIAGNOSIS — K7460 Unspecified cirrhosis of liver: Secondary | ICD-10-CM | POA: Diagnosis not present

## 2016-04-05 DIAGNOSIS — N184 Chronic kidney disease, stage 4 (severe): Secondary | ICD-10-CM | POA: Diagnosis not present

## 2016-04-05 DIAGNOSIS — I130 Hypertensive heart and chronic kidney disease with heart failure and stage 1 through stage 4 chronic kidney disease, or unspecified chronic kidney disease: Secondary | ICD-10-CM | POA: Diagnosis not present

## 2016-04-05 DIAGNOSIS — I2510 Atherosclerotic heart disease of native coronary artery without angina pectoris: Secondary | ICD-10-CM | POA: Diagnosis not present

## 2016-04-13 ENCOUNTER — Encounter (HOSPITAL_COMMUNITY): Payer: Self-pay | Admitting: Internal Medicine

## 2016-04-13 ENCOUNTER — Ambulatory Visit (HOSPITAL_COMMUNITY)
Admission: RE | Admit: 2016-04-13 | Discharge: 2016-04-13 | Disposition: A | Discharge status: Home / Self Care | Payer: Medicare Other | Source: Ambulatory Visit | Attending: Internal Medicine | Admitting: Internal Medicine

## 2016-04-13 VITALS — BP 120/75 | HR 56 | Wt 183.4 lb

## 2016-04-13 DIAGNOSIS — N183 Chronic kidney disease, stage 3 unspecified: Secondary | ICD-10-CM

## 2016-04-13 DIAGNOSIS — S065X9A Traumatic subdural hemorrhage with loss of consciousness of unspecified duration, initial encounter: Secondary | ICD-10-CM

## 2016-04-13 DIAGNOSIS — E785 Hyperlipidemia, unspecified: Secondary | ICD-10-CM | POA: Diagnosis not present

## 2016-04-13 DIAGNOSIS — N179 Acute kidney failure, unspecified: Secondary | ICD-10-CM | POA: Insufficient documentation

## 2016-04-13 DIAGNOSIS — I471 Supraventricular tachycardia: Secondary | ICD-10-CM | POA: Diagnosis not present

## 2016-04-13 DIAGNOSIS — I255 Ischemic cardiomyopathy: Secondary | ICD-10-CM | POA: Insufficient documentation

## 2016-04-13 DIAGNOSIS — G4730 Sleep apnea, unspecified: Secondary | ICD-10-CM

## 2016-04-13 DIAGNOSIS — I509 Heart failure, unspecified: Secondary | ICD-10-CM | POA: Diagnosis not present

## 2016-04-13 DIAGNOSIS — Z7901 Long term (current) use of anticoagulants: Secondary | ICD-10-CM | POA: Insufficient documentation

## 2016-04-13 DIAGNOSIS — Z8249 Family history of ischemic heart disease and other diseases of the circulatory system: Secondary | ICD-10-CM | POA: Insufficient documentation

## 2016-04-13 DIAGNOSIS — Z7982 Long term (current) use of aspirin: Secondary | ICD-10-CM | POA: Insufficient documentation

## 2016-04-13 DIAGNOSIS — I5042 Chronic combined systolic (congestive) and diastolic (congestive) heart failure: Secondary | ICD-10-CM | POA: Diagnosis not present

## 2016-04-13 DIAGNOSIS — I272 Pulmonary hypertension, unspecified: Secondary | ICD-10-CM | POA: Insufficient documentation

## 2016-04-13 DIAGNOSIS — F419 Anxiety disorder, unspecified: Secondary | ICD-10-CM | POA: Diagnosis not present

## 2016-04-13 DIAGNOSIS — S065XAA Traumatic subdural hemorrhage with loss of consciousness status unknown, initial encounter: Secondary | ICD-10-CM

## 2016-04-13 DIAGNOSIS — Z87891 Personal history of nicotine dependence: Secondary | ICD-10-CM | POA: Insufficient documentation

## 2016-04-13 DIAGNOSIS — G4733 Obstructive sleep apnea (adult) (pediatric): Secondary | ICD-10-CM | POA: Diagnosis not present

## 2016-04-13 DIAGNOSIS — Z951 Presence of aortocoronary bypass graft: Secondary | ICD-10-CM | POA: Diagnosis not present

## 2016-04-13 DIAGNOSIS — I252 Old myocardial infarction: Secondary | ICD-10-CM | POA: Diagnosis not present

## 2016-04-13 DIAGNOSIS — I6200 Nontraumatic subdural hemorrhage, unspecified: Secondary | ICD-10-CM

## 2016-04-13 DIAGNOSIS — I2510 Atherosclerotic heart disease of native coronary artery without angina pectoris: Secondary | ICD-10-CM | POA: Diagnosis not present

## 2016-04-13 DIAGNOSIS — Z79899 Other long term (current) drug therapy: Secondary | ICD-10-CM | POA: Insufficient documentation

## 2016-04-13 DIAGNOSIS — Z86711 Personal history of pulmonary embolism: Secondary | ICD-10-CM | POA: Insufficient documentation

## 2016-04-13 DIAGNOSIS — Z95810 Presence of automatic (implantable) cardiac defibrillator: Secondary | ICD-10-CM | POA: Insufficient documentation

## 2016-04-13 DIAGNOSIS — I130 Hypertensive heart and chronic kidney disease with heart failure and stage 1 through stage 4 chronic kidney disease, or unspecified chronic kidney disease: Secondary | ICD-10-CM | POA: Diagnosis not present

## 2016-04-13 DIAGNOSIS — I10 Essential (primary) hypertension: Secondary | ICD-10-CM

## 2016-04-13 DIAGNOSIS — Z8673 Personal history of transient ischemic attack (TIA), and cerebral infarction without residual deficits: Secondary | ICD-10-CM | POA: Diagnosis not present

## 2016-04-13 MED ORDER — TORSEMIDE 20 MG PO TABS: 40.0000 mg | ORAL_TABLET | Freq: Two times a day (BID) | ORAL | 3 refills | Status: DC

## 2016-04-13 NOTE — Progress Notes (Signed)
Patient ID: [name], male   DOB: 14-Oct-1945, 70 y.o.   MRN: [id number]    Advanced Heart Failure Clinic Note   Referring Physician: Dr Harl Bowie  Primary Care: Rick Neighbors, MD Primary Cardiologist: Dr Harl Bowie Primary HF: Dr. Haroldine Laws  HPI: Rick [name] is a 70 y.o. male with history of ischemic cardiomyopathy status post CABG in 2003 as well as previous stenting. Most recent 2-D echo in 2016 EF 25% with restrictive diastolic dysfunction, moderate RV dysfunction PAS P 64, status post ICD followed by Dr. Rayann Heman with normal check in 02/2015. In June 2017 had subdural hema  Pt admitted 5/4 - 10/13/15 with recurrent volume overload in setting of dietary non-compliance. Echo during that admission showed EF worsening to 15-20% from 25%.  Seen in Dr. Harl Bowie office 10/20/15. Mild volume overload with dietary indiscretion. Torsemide increased to 60/40 mg. Weight at that visit 159 lbs  Admitted 6/10 through 11/20/15 with subdural hematoma and heart failure. Had evacuation of subdural hematoma. Required short term inotropes.  He was discharged off bb and ace. Discharge weight was 147 pounds.   Admitted 8/31 through 02/08/16 with marked volume overload. Diuresed with IV lasix transitioned to torsemide 60 mg tiwce a day + metolazone . No BB with low output. Had episodes of SVT so he was started on amio 200 mg tiwce daily. Met with Pallitative for GOC. Plans to continue full support. Discharge weight was 168 pounds.   Pt presents today for regular follow up. Weight down 1 lb from last visit. At last visit increased torsemide to 40 mg q am and 20 mg q pm and metolazone 2.5 mg Wed/Saturday.  States he missed metolazone Saturday as he was out.  Picked it up earlier this week and took a dose Tuesday and Wednesday.  Pees better on metolazone days. Still drinking > 2 L daily per wife.  More ankle edema.  Gets SOB with showering, but not getting dressed. No orthopnea. + bendopnea.   Corevue: Thoracic impedence  trending up.  No VT/VF. Pt activity 2-4 hours daily.   Echo 10/08/15 LVEF 15-20%, Severe LV dilation, Severe hypokinesis with areas of akinesis, Trivial AI, Mild MR, Mod LAE, Severe RV dilation, Severe TR, PA peak pressure 36 mm Hg Echo 03/04/15 LVEF 25%,  Severe hypokinesis, Trivial AI, Mild MR, RV mod dilated and mod reduced, Severe RAE, Mod TR, PA peak pressure 64 mm Hg.   Labs (7/17): K 4.1, creatinine 1.76, BNP 654 Labs 12/16/2015: K 2.9 Creatinine 2.04  Labs (8/17): K 3.2, creatinine 1.65, BNP 1572 Labs (02/08/2016) K 4.4 Creatinine 1.71    Past Medical History:  Diagnosis Date  . AICD (automatic cardioverter/defibrillator) present   . Anxiety   . CAD (coronary artery disease)   . CHF (congestive heart failure) (Rick [name])   . DOE (dyspnea on exertion)   . Essential hypertension   . Family history of adverse reaction to anesthesia    "sister would go into seizures"  . Hyperlipidemia   . Ischemic cardiomyopathy    EF 20% by echo 2013  . Myocardial infarction acute 2003  . 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 Other pulmonary embolism and infarction    On coumadin  . Pulmonary hypertension    56 mm Hg  . Sleep apnea    "tried machine a couple nights; couldn't do it" (11/03/2015)  . Stroke [facility])    left side facial droop since; don't know when it was" (11/03/2015)  . Tricuspid regurgitation    Severe  Current Outpatient Prescriptions  Medication Sig Dispense Refill  . amiodarone (PACERONE) 100 MG tablet Take 2 tablets (200 mg total) by mouth daily. 60 tablet 3  . ferrous sulfate 325 (65 FE) MG tablet Take 325 mg by mouth daily with breakfast. Reported on 12/16/2015    . magnesium oxide (MAG-OX) 400 MG tablet Take 1 tablet (400 mg total) by mouth daily. 30 tablet 3  . metolazone (ZAROXOLYN) 2.5 MG tablet Take 1 tablet (2.5 mg total) by mouth 2 (two) times a week. Wed and Sat 90 tablet 3  . oxyCODONE (OXY IR/ROXICODONE) 5 MG immediate release tablet Take 1 tablet (5 mg total) by mouth  every 4 (four) hours as needed for moderate pain. 50 tablet 0  . potassium chloride SA (K-DUR,KLOR-CON) 20 MEQ tablet Take 1 tablet (20 mEq total) by mouth 2 (two) times daily. Take extra tab on Wed and Sat with metolazone 60 tablet 6  . torsemide (DEMADEX) 20 MG tablet Take 40 mg (2 tabs) in am and 20 mg (1 tab) in pm 90 tablet 6  . warfarin (COUMADIN) 3 MG tablet Take 3 mg by mouth daily.    [name] atorvastatin (LIPITOR) 40 MG tablet Take 1 tablet (40 mg total) by mouth daily. (Patient not taking: Reported on 04/13/2016) 90 tablet 3   No current facility-administered medications for this encounter.    No Known Allergies  Social History   Social History  . Marital status: Married    Spouse name: N/A  . Number of children: 4  . Years of education: N/A   Occupational History  . Disabled.    Social History Main Topics  . Smoking status: Former Smoker    Packs/day: 0.50    Years: 38.00    Types: Cigarettes    Start date: 12/30/1956    Quit date: 06/06/1995  . Smokeless tobacco: Never Used  . Alcohol use 1.8 oz/week    3 Cans of beer per week     Comment: 11/03/2015 "Quit from [id number]; weaning myself off again"  . Drug use: No  . Sexual activity: Not Currently   Other Topics Concern  . Not on file   Social History Narrative   Lives with wife and brother in [place] and grandson in [place].        Family History  Problem Relation Age of Onset  . Heart disease Mother     CAD  . Leukemia Father     Vitals:   04/13/16 1345  BP: 120/75  Pulse: (!) 56  SpO2: 100%  Weight: 183 lb 6.4 oz (83.2 kg)   Wt Readings from Last 3 Encounters:  04/13/16 183 lb 6.4 oz (83.2 kg)  03/23/16 184 lb 12.8 oz (83.8 kg)  03/09/16 182 lb 4 oz (82.7 kg)     PHYSICAL EXAM: General:  Elderly appearing. NAD. Wife present.   HEENT: normal Neck: supple. JVP 10-11 cm with prominent CV wave. Carotids 2+ bilat; no bruits. No thyromegaly or nodule noted.   Cor: PMI nondisplaced. RRR. No rubs.  3/6 MR.  No S3. Lungs: CTAB, normal effort  Abdomen: soft, NT, ND, no HSM. No bruits or masses. +BS  Extremities: no cyanosis, clubbing, rash.  1+ edema despite compression hose in place.  Neuro: alert & oriented x 3, cranial nerves grossly intact. moves all 4 extremities w/o difficulty. Affect pleasant.   ASSESSMENT & PLAN: 1. Chronic biventricular CHF: Has St Jude ICD with Corevue, Echo 10/08/15 LVEF 15-20%, severe RV dilation and decreased  function, severe TR.  Ischemic cardiomyopathy.  Now NYHA class III.   - Volume status looks up, Corevue OK. Likely due to R>L HF - Increase torsemide 40 mg BID. - Continue metolazone 2.5 mg Wednesdays and Saturdays.  Can take extra as needed.  - Continue potassium 20 meq twice daily with extra 20 meq on metolazone days.  - No bb with low output.   - Hold off on spironolactone and lisinopril with non-compliance and recent marked AKI.  - BMET today 2. CAD s/p CABG:  - No CP. No ASA with stable disease on coumadin.  - Continue 40 mg atorvastatin.  No CP.  3. ETOH abuse:  - Denies ETOH.  4. Hx of CVA - On coumadin. No bleeding.  5. Subdural hematoma: S/P evacuation hematoma on 11/15/15. 6. AKI on CKD Stage III:  - Stable on recent check. Will follow up with [place] in 1-2 weeks. 7. OSA:  - Intolerant to CPAP. 8. SVT - Continue amio 200 mg BID.    Exam consistent with R>L HF.  Overall he has a poor prognosis, but is stable for the time being.  Increase diuretics as above.  Follow up 4-6 weeks on PA/NP side.  Will have [place] draw labs in 10-14 days.   Shirley Friar PA-C  04/13/2016   Patient seen and examined with Oda Kilts, PA-C. We discussed all aspects of the encounter. I agree with the assessment and plan as stated above.   He has R>L HF. Volume status remains elevated. NYHA III. Will increase torsemide to 40 bid. Will check labs in 1-2 weeks. ICD interrogated personally in clinic. He is back on coumadin and tolerating well despite previous  SDH. Rhythm stable. Drop amio to 200 daily at next visit.   Finola Rosal,MD 11:22 PM

## 2016-04-13 NOTE — Patient Instructions (Signed)
Increase Torsemide to 40 mg (2 Tabs) Twice daily  Follow up in 4 weeks

## 2016-04-19 ENCOUNTER — Encounter: Payer: Self-pay | Admitting: Internal Medicine

## 2016-04-20 ENCOUNTER — Ambulatory Visit (INDEPENDENT_AMBULATORY_CARE_PROVIDER_SITE_OTHER): Payer: Medicare Other | Admitting: *Deleted

## 2016-04-20 DIAGNOSIS — I4891 Unspecified atrial fibrillation: Secondary | ICD-10-CM

## 2016-04-20 DIAGNOSIS — I255 Ischemic cardiomyopathy: Secondary | ICD-10-CM

## 2016-04-20 DIAGNOSIS — Z5181 Encounter for therapeutic drug level monitoring: Secondary | ICD-10-CM

## 2016-04-20 LAB — POCT INR: INR: 2.5

## 2016-05-03 ENCOUNTER — Encounter: Payer: Medicare Other | Admitting: *Deleted

## 2016-05-03 ENCOUNTER — Telehealth: Payer: Self-pay | Admitting: Cardiology

## 2016-05-03 NOTE — Telephone Encounter (Signed)
Spoke with pt and reminded pt of remote transmission that is due today. Pt verbalized understanding.   

## 2016-05-05 ENCOUNTER — Encounter: Payer: Self-pay | Admitting: Cardiology

## 2016-05-08 ENCOUNTER — Telehealth: Payer: Self-pay

## 2016-05-08 NOTE — Progress Notes (Signed)
error 

## 2016-05-08 NOTE — Telephone Encounter (Signed)
Patient referred to [facility] clinic by Dr Rayann Heman.  Attempted 3rd ICM intro call to patient and left message to return call.  Unable to enroll patient in [facility] clinic at this time.

## 2016-05-11 ENCOUNTER — Ambulatory Visit (INDEPENDENT_AMBULATORY_CARE_PROVIDER_SITE_OTHER): Payer: Medicare Other | Admitting: *Deleted

## 2016-05-11 ENCOUNTER — Ambulatory Visit (HOSPITAL_COMMUNITY)
Admission: RE | Admit: 2016-05-11 | Discharge: 2016-05-11 | Disposition: A | Discharge status: Home / Self Care | Payer: Medicare Other | Source: Ambulatory Visit | Attending: Internal Medicine | Admitting: Internal Medicine

## 2016-05-11 VITALS — BP 100/60 | HR 67 | Wt 182.6 lb

## 2016-05-11 DIAGNOSIS — I255 Ischemic cardiomyopathy: Secondary | ICD-10-CM | POA: Insufficient documentation

## 2016-05-11 DIAGNOSIS — I5042 Chronic combined systolic (congestive) and diastolic (congestive) heart failure: Secondary | ICD-10-CM | POA: Insufficient documentation

## 2016-05-11 DIAGNOSIS — Z79899 Other long term (current) drug therapy: Secondary | ICD-10-CM | POA: Diagnosis not present

## 2016-05-11 DIAGNOSIS — Z95810 Presence of automatic (implantable) cardiac defibrillator: Secondary | ICD-10-CM | POA: Diagnosis not present

## 2016-05-11 DIAGNOSIS — Z8673 Personal history of transient ischemic attack (TIA), and cerebral infarction without residual deficits: Secondary | ICD-10-CM | POA: Diagnosis not present

## 2016-05-11 DIAGNOSIS — N183 Chronic kidney disease, stage 3 unspecified: Secondary | ICD-10-CM

## 2016-05-11 DIAGNOSIS — I471 Supraventricular tachycardia: Secondary | ICD-10-CM | POA: Insufficient documentation

## 2016-05-11 DIAGNOSIS — G4730 Sleep apnea, unspecified: Secondary | ICD-10-CM

## 2016-05-11 DIAGNOSIS — F1010 Alcohol abuse, uncomplicated: Secondary | ICD-10-CM | POA: Insufficient documentation

## 2016-05-11 DIAGNOSIS — Z86711 Personal history of pulmonary embolism: Secondary | ICD-10-CM | POA: Diagnosis not present

## 2016-05-11 DIAGNOSIS — E785 Hyperlipidemia, unspecified: Secondary | ICD-10-CM | POA: Insufficient documentation

## 2016-05-11 DIAGNOSIS — Z8249 Family history of ischemic heart disease and other diseases of the circulatory system: Secondary | ICD-10-CM | POA: Insufficient documentation

## 2016-05-11 DIAGNOSIS — Z7901 Long term (current) use of anticoagulants: Secondary | ICD-10-CM | POA: Diagnosis not present

## 2016-05-11 DIAGNOSIS — I2510 Atherosclerotic heart disease of native coronary artery without angina pectoris: Secondary | ICD-10-CM | POA: Insufficient documentation

## 2016-05-11 DIAGNOSIS — I130 Hypertensive heart and chronic kidney disease with heart failure and stage 1 through stage 4 chronic kidney disease, or unspecified chronic kidney disease: Secondary | ICD-10-CM | POA: Insufficient documentation

## 2016-05-11 DIAGNOSIS — I272 Pulmonary hypertension, unspecified: Secondary | ICD-10-CM | POA: Insufficient documentation

## 2016-05-11 DIAGNOSIS — G4733 Obstructive sleep apnea (adult) (pediatric): Secondary | ICD-10-CM | POA: Insufficient documentation

## 2016-05-11 DIAGNOSIS — Z951 Presence of aortocoronary bypass graft: Secondary | ICD-10-CM | POA: Insufficient documentation

## 2016-05-11 DIAGNOSIS — N179 Acute kidney failure, unspecified: Secondary | ICD-10-CM | POA: Diagnosis not present

## 2016-05-11 DIAGNOSIS — I252 Old myocardial infarction: Secondary | ICD-10-CM | POA: Insufficient documentation

## 2016-05-11 DIAGNOSIS — Z87891 Personal history of nicotine dependence: Secondary | ICD-10-CM | POA: Insufficient documentation

## 2016-05-11 LAB — BASIC METABOLIC PANEL
Anion gap: 10 (ref 5–15)
BUN: 26 mg/dL — AB (ref 6–20)
CHLORIDE: 95 mmol/L — AB (ref 101–111)
CO2: 30 mmol/L (ref 22–32)
CREATININE: 1.53 mg/dL — AB (ref 0.61–1.24)
Calcium: 9.1 mg/dL (ref 8.9–10.3)
GFR calc non Af Amer: 44 mL/min — ABNORMAL LOW (ref >60)
GFR, EST AFRICAN AMERICAN: 51 mL/min — AB (ref >60)
GLUCOSE: 157 mg/dL — AB (ref 65–99)
Potassium: 2.8 mmol/L — ABNORMAL LOW (ref 3.5–5.1)
Sodium: 135 mmol/L (ref 135–145)

## 2016-05-11 LAB — BRAIN NATRIURETIC PEPTIDE: B Natriuretic Peptide: 898 pg/mL — ABNORMAL HIGH (ref 0.0–100.0)

## 2016-05-11 MED ORDER — METOLAZONE 2.5 MG PO TABS: 2.5000 mg | ORAL_TABLET | ORAL | 3 refills | Status: DC

## 2016-05-11 NOTE — Progress Notes (Signed)
Patient ID: Rick [name], male   DOB: 10/24/1945, 70 y.o.   MRN: [id number]    Advanced Heart Failure Clinic Note   Referring Physician: Dr Harl Bowie  Primary Care: Wende Neighbors, MD Primary Cardiologist: Dr Harl Bowie Primary HF: Dr. Haroldine Laws  HPI: Rick [name] is a 70 y.o. male with history of ischemic cardiomyopathy status post CABG in 2003 as well as previous stenting. Most recent 2-D echo in 2016 EF 25% with restrictive diastolic dysfunction, moderate RV dysfunction PAS P 64, status post ICD followed by Dr. Rayann Heman with normal check in 02/2015. In June 2017 had subdural hema  Pt admitted 5/4 - 10/13/15 with recurrent volume overload in setting of dietary non-compliance. Echo during that admission showed EF worsening to 15-20% from 25%.  Seen in Dr. Harl Bowie office 10/20/15. Mild volume overload with dietary indiscretion. Torsemide increased to 60/40 mg. Weight at that visit 159 lbs  Admitted 6/10 through 11/20/15 with subdural hematoma and heart failure. Had evacuation of subdural hematoma. Required short term inotropes.  He was discharged off bb and ace. Discharge weight was 147 pounds.   Admitted 8/31 through 02/08/16 with marked volume overload. Diuresed with IV lasix transitioned to torsemide 60 mg tiwce a day + metolazone . No BB with low output. Had episodes of SVT so he was started on amio 200 mg tiwce daily. Met with Pallitative for GOC. Plans to continue full support. Discharge weight was 168 pounds.   Pt presents today for regular follow up with his wife. Last visit torsemide was increased to 40 mg twice a day. Overall feeling ok. Weight at home went up went up over Thanksgiving but now coming back down. Weight at home trending down from 179 to 185 pounds. SOB with brisk walking. Also dyspnea when he showers. Denies PND. Denies CP. Sleeps in a chair chronically.  Taking all medications.    Echo 10/08/15 LVEF 15-20%, Severe LV dilation, Severe hypokinesis with areas of akinesis, Trivial AI, Mild  MR, Mod LAE, Severe RV dilation, Severe TR, PA peak pressure 36 mm Hg Echo 03/04/15 LVEF 25%,  Severe hypokinesis, Trivial AI, Mild MR, RV mod dilated and mod reduced, Severe RAE, Mod TR, PA peak pressure 64 mm Hg.   Labs (7/17): K 4.1, creatinine 1.76, BNP 654 Labs 12/16/2015: K 2.9 Creatinine 2.04  Labs (8/17): K 3.2, creatinine 1.65, BNP 1572 Labs (02/08/2016) K 4.4 Creatinine 1.71    Past Medical History:  Diagnosis Date  . AICD (automatic cardioverter/defibrillator) present   . Anxiety   . CAD (coronary artery disease)   . CHF (congestive heart failure) ([place])   . DOE (dyspnea on exertion)   . Essential hypertension   . Family history of adverse reaction to anesthesia    "sister would go into seizures"  . Hyperlipidemia   . Ischemic cardiomyopathy    EF 20% by echo 2013  . Myocardial infarction acute 2003  . 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 Other pulmonary embolism and infarction    On coumadin  . Pulmonary hypertension    56 mm Hg  . Sleep apnea    "tried machine a couple nights; couldn't do it" (11/03/2015)  . Stroke [facility])    left side facial droop since; don't know when it was" (11/03/2015)  . Tricuspid regurgitation    Severe    Current Outpatient Prescriptions  Medication Sig Dispense Refill  . amiodarone (PACERONE) 100 MG tablet Take 2 tablets (200 mg total) by mouth daily. 60 tablet 3  . atorvastatin (  LIPITOR) 40 MG tablet Take 1 tablet (40 mg total) by mouth daily. 90 tablet 3  . ferrous sulfate 325 (65 FE) MG tablet Take 325 mg by mouth daily with breakfast. Reported on 12/16/2015    . magnesium oxide (MAG-OX) 400 MG tablet Take 1 tablet (400 mg total) by mouth daily. 30 tablet 3  . metolazone (ZAROXOLYN) 2.5 MG tablet Take 1 tablet (2.5 mg total) by mouth 2 (two) times a week. Wed and Sat 90 tablet 3  . oxyCODONE (OXY IR/ROXICODONE) 5 MG immediate release tablet Take 1 tablet (5 mg total) by mouth every 4 (four) hours as needed for moderate pain. 50 tablet 0  .  potassium chloride SA (K-DUR,KLOR-CON) 20 MEQ tablet Take 1 tablet (20 mEq total) by mouth 2 (two) times daily. Take extra tab on Wed and Sat with metolazone 60 tablet 6  . torsemide (DEMADEX) 20 MG tablet Take 2 tablets (40 mg total) by mouth 2 (two) times daily. 120 tablet 3  . warfarin (COUMADIN) 3 MG tablet Take 3 mg by mouth daily.     No current facility-administered medications for this encounter.    No Known Allergies  Social History   Social History  . Marital status: Married    Spouse name: N/A  . Number of children: 4  . Years of education: N/A   Occupational History  . Disabled.    Social History Main Topics  . Smoking status: Former Smoker    Packs/day: 0.50    Years: 38.00    Types: Cigarettes    Start date: 12/30/1956    Quit date: 06/06/1995  . Smokeless tobacco: Never Used  . Alcohol use 1.8 oz/week    3 Cans of beer per week     Comment: 11/03/2015 "Quit from [id number]; weaning myself off again"  . Drug use: No  . Sexual activity: Not Currently   Other Topics Concern  . Not on file   Social History Narrative   Lives with wife and brother in [place] and grandson in [place].        Family History  Problem Relation Age of Onset  . Heart disease Mother     CAD  . Leukemia Father     Vitals:   05/11/16 0927  BP: 100/60  Pulse: 67  SpO2: 98%  Weight: 182 lb 9.6 oz (82.8 kg)   Wt Readings from Last 3 Encounters:  05/11/16 182 lb 9.6 oz (82.8 kg)  04/13/16 183 lb 6.4 oz (83.2 kg)  03/23/16 184 lb 12.8 oz (83.8 kg)     PHYSICAL EXAM: General:  Elderly appearing. NAD. Wife present.   HEENT: normal Neck: supple. JVP 9-10 cm with prominent CV wave. Carotids 2+ bilat; no bruits. No thyromegaly or nodule noted.   Cor: PMI nondisplaced. RRR. No rubs. 3/6 MR.  No S3. Lungs: CTAB, normal effort  Abdomen: soft, NT, ND, no HSM. No bruits or masses. +BS  Extremities: no cyanosis, clubbing, rash.  Trace edema R and LLE .  Neuro: alert & oriented x 3,  cranial nerves grossly intact. moves all 4 extremities w/o difficulty. Affect pleasant.   ASSESSMENT & PLAN: 1. Chronic biventricular CHF: Has St Jude ICD with Corevue, Echo 10/08/15 LVEF 15-20%, severe RV dilation and decreased function, severe TR.  Ischemic cardiomyopathy.  Now NYHA class III.  Volume status lok.  Continue torsemide 40 mg BID = metolazone twice a week. Can take extra as needed.  - Continue potassium 20 meq twice daily with  extra 20 meq on metolazone days.  - No bb with low output.   - Hold off on spironolactone and lisinopril with non-compliance and recent marked AKI.  - BMET today 2. CAD s/p CABG:  - No CP. No ASA with stable disease on coumadin.  - Continue 40 mg atorvastatin.  No CP.  3. ETOH abuse:  - Denies ETOH.  4. Hx of CVA - On coumadin. No bleeding.  5. Subdural hematoma: S/P evacuation hematoma on 11/15/15. 6. AKI on CKD Stage III:  - Stable on recent check. Will follow up with [place] in 1-2 weeks. 7. OSA:  - Intolerant to CPAP. 8. SVT - Continue amio 200 mg BID.  Will need yearly eye exams.  Follow up in 6 weeks. Next visit cut back amio to 200 mg daily and check TSH, LFTs.  Rick Kosel NP-C  05/11/2016

## 2016-05-11 NOTE — Patient Instructions (Signed)
Take metolazone 1 tablet (2.5 mg total) by mouth 2 (two) times a week. Every Wednesday and Saturday morning. Take 1 extra tablet for weight 185 lbs or more.  Routine lab work today. Will notify you of abnormal results, otherwise no news is good news!  Follow up 6 weeks with Amy Clegg NP-C.  Merry Christmas and Happy New Year!  Do the following things EVERYDAY: 1) Weigh yourself in the morning before breakfast. Write it down and keep it in a log. 2) Take your medicines as prescribed 3) Eat low salt foods-Limit salt (sodium) to 2000 mg per day.  4) Stay as active as you can everyday 5) Limit all fluids for the day to less than 2 liters

## 2016-05-12 NOTE — Progress Notes (Signed)
Remote ICD transmission.   

## 2016-05-17 ENCOUNTER — Encounter (HOSPITAL_COMMUNITY): Payer: Self-pay | Admitting: Cardiology

## 2016-05-17 ENCOUNTER — Encounter: Payer: Self-pay | Admitting: Cardiology

## 2016-05-18 ENCOUNTER — Ambulatory Visit (INDEPENDENT_AMBULATORY_CARE_PROVIDER_SITE_OTHER): Payer: Medicare Other | Admitting: *Deleted

## 2016-05-18 DIAGNOSIS — I255 Ischemic cardiomyopathy: Secondary | ICD-10-CM | POA: Diagnosis not present

## 2016-05-18 DIAGNOSIS — I4891 Unspecified atrial fibrillation: Secondary | ICD-10-CM | POA: Diagnosis not present

## 2016-05-18 DIAGNOSIS — Z5181 Encounter for therapeutic drug level monitoring: Secondary | ICD-10-CM | POA: Diagnosis not present

## 2016-05-18 LAB — POCT INR: INR: 3.7

## 2016-05-22 ENCOUNTER — Telehealth: Payer: Self-pay | Admitting: Cardiology

## 2016-05-22 ENCOUNTER — Other Ambulatory Visit: Payer: Self-pay | Admitting: Internal Medicine

## 2016-05-22 MED ORDER — POTASSIUM CHLORIDE CRYS ER 20 MEQ PO TBCR: 40.0000 meq | EXTENDED_RELEASE_TABLET | Freq: Two times a day (BID) | ORAL | 6 refills | Status: DC

## 2016-05-22 NOTE — Telephone Encounter (Signed)
Spoke w/pt, he states he is feeling better and SOB is better after extra Torsemide.  He states wt is down to 199 lb this AM, he had been 203 lb.  He states he made a mistake when talking to Dr Radford Pax this am, he has been taking Torsemide 40 mg BID and Metolazone on WEd and Sat.  Advised to continue taking meds as prescribed and monitor wt and breathing, advised if wt doesn't continue to decrease or sob gets worse to call us back, pt is agreeable.  Also in reviewed chart we have been leaving message for pt to call regarding lab work, he needs to increase KCL dose, pt aware and agreeable.

## 2016-05-22 NOTE — Telephone Encounter (Signed)
Patient called stating that he has gained weight from 191-203lbs in the past 24 hours.  He has been more SOB than usual and slightly worse LE edema.  He took demadex 20mg  twice yesterday.  His last demadex was taken at 6pm.  I informed him that according to the most recent notes he was supposed to be on 40mg  BID but he has only been taking 20mg  BID.  I instructed him to take another 40mg  demadex now and if symptoms dont improve to come to the ER.  He understands and agrees with plan.

## 2016-05-22 NOTE — Addendum Note (Signed)
Addended by: Scarlette Calico on: 05/22/2016 10:11 AM   Modules accepted: Orders

## 2016-05-24 ENCOUNTER — Telehealth (HOSPITAL_COMMUNITY): Payer: Self-pay | Admitting: *Deleted

## 2016-05-24 NOTE — Telephone Encounter (Signed)
Pt called to report his wt has gone back up to 203 lbs this am, he should be around 191.  He states he has been taking his Torsemide 40 mg BID and took his metolazone this AM.  He does state he is SOB w/exertion.  Discussed w/Dr Bensimhon, he recommends pt increase Torsemide to 60 mg BID and have labs and f/u soon.  Pt aware and verbalizes understanding.  F/U appt sch for 12/27

## 2016-05-31 ENCOUNTER — Other Ambulatory Visit: Payer: Self-pay

## 2016-05-31 ENCOUNTER — Inpatient Hospital Stay (HOSPITAL_COMMUNITY): Payer: Medicare Other

## 2016-05-31 ENCOUNTER — Ambulatory Visit (HOSPITAL_COMMUNITY)
Admission: RE | Admit: 2016-05-31 | Discharge: 2016-05-31 | Disposition: A | Discharge status: Home / Self Care | Payer: Medicare Other | Source: Ambulatory Visit | Attending: Internal Medicine | Admitting: Internal Medicine

## 2016-05-31 ENCOUNTER — Emergency Department (HOSPITAL_COMMUNITY): Payer: Medicare Other

## 2016-05-31 ENCOUNTER — Inpatient Hospital Stay (HOSPITAL_COMMUNITY)
Admission: EM | Admit: 2016-05-31 | Discharge: 2016-06-09 | DRG: 291 | Disposition: A | Discharge status: Hospice | Payer: Medicare Other | Attending: Internal Medicine | Admitting: Internal Medicine

## 2016-05-31 ENCOUNTER — Encounter (HOSPITAL_COMMUNITY): Payer: Self-pay

## 2016-05-31 VITALS — BP 108/68 | HR 66 | Wt 210.5 lb

## 2016-05-31 DIAGNOSIS — Z79891 Long term (current) use of opiate analgesic: Secondary | ICD-10-CM

## 2016-05-31 DIAGNOSIS — M109 Gout, unspecified: Secondary | ICD-10-CM | POA: Diagnosis present

## 2016-05-31 DIAGNOSIS — I4510 Unspecified right bundle-branch block: Secondary | ICD-10-CM | POA: Diagnosis present

## 2016-05-31 DIAGNOSIS — J189 Pneumonia, unspecified organism: Secondary | ICD-10-CM | POA: Diagnosis present

## 2016-05-31 DIAGNOSIS — Z66 Do not resuscitate: Secondary | ICD-10-CM | POA: Diagnosis not present

## 2016-05-31 DIAGNOSIS — R05 Cough: Secondary | ICD-10-CM | POA: Diagnosis not present

## 2016-05-31 DIAGNOSIS — Z515 Encounter for palliative care: Secondary | ICD-10-CM

## 2016-05-31 DIAGNOSIS — Z9981 Dependence on supplemental oxygen: Secondary | ICD-10-CM | POA: Diagnosis not present

## 2016-05-31 DIAGNOSIS — E785 Hyperlipidemia, unspecified: Secondary | ICD-10-CM | POA: Diagnosis present

## 2016-05-31 DIAGNOSIS — F419 Anxiety disorder, unspecified: Secondary | ICD-10-CM | POA: Diagnosis present

## 2016-05-31 DIAGNOSIS — Z7901 Long term (current) use of anticoagulants: Secondary | ICD-10-CM

## 2016-05-31 DIAGNOSIS — I472 Ventricular tachycardia, unspecified: Secondary | ICD-10-CM

## 2016-05-31 DIAGNOSIS — Z452 Encounter for adjustment and management of vascular access device: Secondary | ICD-10-CM | POA: Diagnosis not present

## 2016-05-31 DIAGNOSIS — I509 Heart failure, unspecified: Secondary | ICD-10-CM | POA: Diagnosis not present

## 2016-05-31 DIAGNOSIS — K5900 Constipation, unspecified: Secondary | ICD-10-CM

## 2016-05-31 DIAGNOSIS — I493 Ventricular premature depolarization: Secondary | ICD-10-CM | POA: Diagnosis present

## 2016-05-31 DIAGNOSIS — I272 Pulmonary hypertension, unspecified: Secondary | ICD-10-CM | POA: Diagnosis present

## 2016-05-31 DIAGNOSIS — Z87891 Personal history of nicotine dependence: Secondary | ICD-10-CM

## 2016-05-31 DIAGNOSIS — Z79899 Other long term (current) drug therapy: Secondary | ICD-10-CM

## 2016-05-31 DIAGNOSIS — I130 Hypertensive heart and chronic kidney disease with heart failure and stage 1 through stage 4 chronic kidney disease, or unspecified chronic kidney disease: Secondary | ICD-10-CM | POA: Diagnosis present

## 2016-05-31 DIAGNOSIS — R739 Hyperglycemia, unspecified: Secondary | ICD-10-CM | POA: Diagnosis present

## 2016-05-31 DIAGNOSIS — Z8249 Family history of ischemic heart disease and other diseases of the circulatory system: Secondary | ICD-10-CM

## 2016-05-31 DIAGNOSIS — I5082 Biventricular heart failure: Secondary | ICD-10-CM | POA: Diagnosis not present

## 2016-05-31 DIAGNOSIS — I5084 End stage heart failure: Secondary | ICD-10-CM | POA: Diagnosis not present

## 2016-05-31 DIAGNOSIS — I5023 Acute on chronic systolic (congestive) heart failure: Secondary | ICD-10-CM | POA: Diagnosis not present

## 2016-05-31 DIAGNOSIS — N179 Acute kidney failure, unspecified: Secondary | ICD-10-CM | POA: Diagnosis not present

## 2016-05-31 DIAGNOSIS — I5042 Chronic combined systolic (congestive) and diastolic (congestive) heart failure: Secondary | ICD-10-CM | POA: Diagnosis present

## 2016-05-31 DIAGNOSIS — J9601 Acute respiratory failure with hypoxia: Secondary | ICD-10-CM | POA: Diagnosis present

## 2016-05-31 DIAGNOSIS — I255 Ischemic cardiomyopathy: Secondary | ICD-10-CM | POA: Diagnosis present

## 2016-05-31 DIAGNOSIS — Z951 Presence of aortocoronary bypass graft: Secondary | ICD-10-CM

## 2016-05-31 DIAGNOSIS — I471 Supraventricular tachycardia: Secondary | ICD-10-CM | POA: Diagnosis present

## 2016-05-31 DIAGNOSIS — R0602 Shortness of breath: Secondary | ICD-10-CM

## 2016-05-31 DIAGNOSIS — I071 Rheumatic tricuspid insufficiency: Secondary | ICD-10-CM | POA: Diagnosis present

## 2016-05-31 DIAGNOSIS — I2510 Atherosclerotic heart disease of native coronary artery without angina pectoris: Secondary | ICD-10-CM | POA: Diagnosis present

## 2016-05-31 DIAGNOSIS — Z95810 Presence of automatic (implantable) cardiac defibrillator: Secondary | ICD-10-CM | POA: Diagnosis not present

## 2016-05-31 DIAGNOSIS — Z7189 Other specified counseling: Secondary | ICD-10-CM

## 2016-05-31 DIAGNOSIS — N183 Chronic kidney disease, stage 3 (moderate): Secondary | ICD-10-CM | POA: Diagnosis present

## 2016-05-31 DIAGNOSIS — Z720 Tobacco use: Secondary | ICD-10-CM | POA: Diagnosis not present

## 2016-05-31 DIAGNOSIS — I5022 Chronic systolic (congestive) heart failure: Secondary | ICD-10-CM | POA: Diagnosis not present

## 2016-05-31 DIAGNOSIS — I25810 Atherosclerosis of coronary artery bypass graft(s) without angina pectoris: Secondary | ICD-10-CM | POA: Diagnosis not present

## 2016-05-31 DIAGNOSIS — Z8673 Personal history of transient ischemic attack (TIA), and cerebral infarction without residual deficits: Secondary | ICD-10-CM

## 2016-05-31 DIAGNOSIS — I110 Hypertensive heart disease with heart failure: Secondary | ICD-10-CM | POA: Diagnosis not present

## 2016-05-31 DIAGNOSIS — Z86711 Personal history of pulmonary embolism: Secondary | ICD-10-CM

## 2016-05-31 DIAGNOSIS — I5021 Acute systolic (congestive) heart failure: Secondary | ICD-10-CM | POA: Diagnosis present

## 2016-05-31 HISTORY — PX: IR GENERIC HISTORICAL: IMG1180011

## 2016-05-31 LAB — BASIC METABOLIC PANEL
ANION GAP: 9 (ref 5–15)
BUN: 38 mg/dL — ABNORMAL HIGH (ref 6–20)
CALCIUM: 8.9 mg/dL (ref 8.9–10.3)
CO2: 27 mmol/L (ref 22–32)
Chloride: 100 mmol/L — ABNORMAL LOW (ref 101–111)
Creatinine, Ser: 2.52 mg/dL — ABNORMAL HIGH (ref 0.61–1.24)
GFR, EST AFRICAN AMERICAN: 28 mL/min — AB (ref >60)
GFR, EST NON AFRICAN AMERICAN: 24 mL/min — AB (ref >60)
Glucose, Bld: 101 mg/dL — ABNORMAL HIGH (ref 65–99)
Potassium: 4.4 mmol/L (ref 3.5–5.1)
Sodium: 136 mmol/L (ref 135–145)

## 2016-05-31 LAB — HEPATIC FUNCTION PANEL
ALBUMIN: 3 g/dL — AB (ref 3.5–5.0)
ALT: 20 U/L (ref 17–63)
AST: 47 U/L — ABNORMAL HIGH (ref 15–41)
Alkaline Phosphatase: 121 U/L (ref 38–126)
BILIRUBIN TOTAL: 3.4 mg/dL — AB (ref 0.3–1.2)
Bilirubin, Direct: 1.4 mg/dL — ABNORMAL HIGH (ref 0.1–0.5)
Indirect Bilirubin: 2 mg/dL — ABNORMAL HIGH (ref 0.3–0.9)
TOTAL PROTEIN: 7.4 g/dL (ref 6.5–8.1)

## 2016-05-31 LAB — PROTIME-INR
INR: 2.45
Prothrombin Time: 27.1 seconds — ABNORMAL HIGH (ref 11.4–15.2)

## 2016-05-31 LAB — COOXEMETRY PANEL
Carboxyhemoglobin: 1.6 % — ABNORMAL HIGH (ref 0.5–1.5)
Methemoglobin: 0.9 % (ref 0.0–1.5)
O2 Saturation: 72.4 %
TOTAL HEMOGLOBIN: 11.2 g/dL — AB (ref 12.0–16.0)

## 2016-05-31 LAB — CBC
HCT: 33.7 % — ABNORMAL LOW (ref 39.0–52.0)
HEMOGLOBIN: 10.8 g/dL — AB (ref 13.0–17.0)
MCH: 28.5 pg (ref 26.0–34.0)
MCHC: 32 g/dL (ref 30.0–36.0)
MCV: 88.9 fL (ref 78.0–100.0)
Platelets: 98 10*3/uL — ABNORMAL LOW (ref 150–400)
RBC: 3.79 MIL/uL — AB (ref 4.22–5.81)
RDW: 17.2 % — ABNORMAL HIGH (ref 11.5–15.5)
WBC: 5.7 10*3/uL (ref 4.0–10.5)

## 2016-05-31 LAB — I-STAT TROPONIN, ED: Troponin i, poc: 0 ng/mL (ref 0.00–0.08)

## 2016-05-31 LAB — BRAIN NATRIURETIC PEPTIDE: B Natriuretic Peptide: 1763.1 pg/mL — ABNORMAL HIGH (ref 0.0–100.0)

## 2016-05-31 MED ORDER — FERROUS SULFATE 325 (65 FE) MG PO TABS
325.0000 mg | ORAL_TABLET | Freq: Every day | ORAL | Status: DC
  Administered 2016-06-01 – 2016-06-09 (×9): 325 mg via ORAL
  Filled 2016-05-31 (×9): qty 1

## 2016-05-31 MED ORDER — ONDANSETRON HCL 4 MG/2ML IJ SOLN: 4.0000 mg | Freq: Four times a day (QID) | INTRAMUSCULAR | Status: DC | PRN

## 2016-05-31 MED ORDER — LIDOCAINE HCL (PF) 2 % IJ SOLN
INTRAMUSCULAR | Status: AC | PRN
  Administered 2016-05-31: 2 mL

## 2016-05-31 MED ORDER — FUROSEMIDE 10 MG/ML IJ SOLN
80.0000 mg | Freq: Three times a day (TID) | INTRAMUSCULAR | Status: DC
  Administered 2016-05-31 – 2016-06-06 (×18): 80 mg via INTRAVENOUS
  Filled 2016-05-31 (×19): qty 8

## 2016-05-31 MED ORDER — WARFARIN - PHARMACIST DOSING INPATIENT
Freq: Every day | Status: DC
  Administered 2016-05-31 – 2016-06-06 (×3)

## 2016-05-31 MED ORDER — ALBUTEROL SULFATE (2.5 MG/3ML) 0.083% IN NEBU
2.5000 mg | INHALATION_SOLUTION | Freq: Once | RESPIRATORY_TRACT | Status: AC
  Administered 2016-05-31: 2.5 mg via RESPIRATORY_TRACT
  Filled 2016-05-31: qty 3

## 2016-05-31 MED ORDER — MILRINONE LACTATE IN DEXTROSE 20-5 MG/100ML-% IV SOLN
0.2500 ug/kg/min | INTRAVENOUS | Status: DC
  Administered 2016-05-31 – 2016-06-01 (×2): 0.25 ug/kg/min via INTRAVENOUS
  Filled 2016-05-31 (×2): qty 100

## 2016-05-31 MED ORDER — AMIODARONE HCL 200 MG PO TABS
200.0000 mg | ORAL_TABLET | Freq: Every day | ORAL | Status: DC
  Administered 2016-05-31 – 2016-06-01 (×2): 200 mg via ORAL
  Filled 2016-05-31 (×2): qty 1

## 2016-05-31 MED ORDER — LIDOCAINE HCL (PF) 2 % IJ SOLN
INTRAMUSCULAR | Status: AC
  Filled 2016-05-31: qty 10

## 2016-05-31 MED ORDER — SODIUM CHLORIDE 0.9 % IV SOLN: 250.0000 mL | INTRAVENOUS | Status: DC | PRN

## 2016-05-31 MED ORDER — ACETAMINOPHEN 325 MG PO TABS
650.0000 mg | ORAL_TABLET | ORAL | Status: DC | PRN
  Administered 2016-06-06 – 2016-06-07 (×3): 650 mg via ORAL
  Filled 2016-05-31 (×3): qty 2

## 2016-05-31 MED ORDER — MAGNESIUM OXIDE 400 MG PO TABS: 400.0000 mg | ORAL_TABLET | Freq: Every day | ORAL | Status: DC

## 2016-05-31 MED ORDER — ATORVASTATIN CALCIUM 40 MG PO TABS
40.0000 mg | ORAL_TABLET | Freq: Every day | ORAL | Status: DC
  Administered 2016-06-01 – 2016-06-09 (×9): 40 mg via ORAL
  Filled 2016-05-31 (×9): qty 1

## 2016-05-31 MED ORDER — SODIUM CHLORIDE 0.9% FLUSH
3.0000 mL | Freq: Two times a day (BID) | INTRAVENOUS | Status: DC
  Administered 2016-05-31 – 2016-06-09 (×17): 3 mL via INTRAVENOUS

## 2016-05-31 MED ORDER — FUROSEMIDE 10 MG/ML IJ SOLN
80.0000 mg | Freq: Once | INTRAMUSCULAR | Status: AC
  Administered 2016-05-31: 80 mg via INTRAVENOUS
  Filled 2016-05-31: qty 8

## 2016-05-31 MED ORDER — MAGNESIUM OXIDE 400 (241.3 MG) MG PO TABS
400.0000 mg | ORAL_TABLET | Freq: Every day | ORAL | Status: DC
  Administered 2016-05-31 – 2016-06-09 (×10): 400 mg via ORAL
  Filled 2016-05-31 (×11): qty 1

## 2016-05-31 MED ORDER — WARFARIN SODIUM 3 MG PO TABS
3.0000 mg | ORAL_TABLET | Freq: Once | ORAL | Status: AC
Start: 2016-05-31 — End: 2016-05-31
  Administered 2016-05-31: 3 mg via ORAL
  Filled 2016-05-31: qty 1

## 2016-05-31 MED ORDER — SODIUM CHLORIDE 0.9% FLUSH: 3.0000 mL | INTRAVENOUS | Status: DC | PRN

## 2016-05-31 NOTE — ED Notes (Signed)
Called report to [facility], they called heart failure team and changing bed request to SDU due to needing CVP monitoring.

## 2016-05-31 NOTE — H&P (Signed)
Advanced Heart Failure Team History and Physical Note   Primary Care: Wende Neighbors, MD Primary Cardiologist: Dr Harl Bowie Primary HF: Dr. Haroldine Laws  Reason for Admission: Acute on Chronic systolic CHF   HPI:    Rick [name] is a 70 y.o. male with history of ischemic cardiomyopathy status post CABG in 2003 as well as previous stenting. Most recent 2-D echo in 2016 EF 25% with restrictive diastolic dysfunction, moderate RV dysfunction PAS P 64, status post ICD followed by Dr. Rayann Heman with normal check in 02/2015. In June 2017 had subdural hematoma.   Pt last seen in HF clinic 05/11/16. Was thought to be stable at that time. Weight 182 lbs.      Since 05/22/16 has had diuretics (torsemide and metolazone) adjusted for worsening SOB and weight gain.   Pt presented to HF clinic this am early for his appt. Was noted to be 30 lbs up and SOB at rest. Sent to ED.  BMET consistent with AKI. BNP 1763. CXR with RLL opacity consistent with atelectasis or PNA and possible small R effusion. Suspect mild superimposed interstitial edema. + Cardiomegaly.  Pt SOB at rest and worse with any activity including conversation.  Swelling up to his thighs and flanks and notes decreased urine output. + orthopnea.   Review of Systems: [y] = yes, [ ]  = no   General: Weight gain Blue.Reese ]; Weight loss [ ] ; Anorexia [ ] ; Fatigue [y]; Fever [ ] ; Chills [ ] ; Weakness [ ]   Cardiac: Chest pain/pressure [ ] ; Resting SOB [y]; Exertional SOB [y]; Orthopnea [y]; Pedal Edema [y]; Palpitations [ ] ; Syncope [ ] ; Presyncope [ ] ; Paroxysmal nocturnal dyspnea[ ]   Pulmonary: Cough [y]; Wheezing[ ] ; Hemoptysis[ ] ; Sputum [ ] ; Snoring [ ]   GI: Vomiting[ ] ; Dysphagia[ ] ; Melena[ ] ; Hematochezia [ ] ; Heartburn[ ] ; Abdominal pain [ ] ; Constipation [ ] ; Diarrhea [ ] ; BRBPR [ ]   GU: Hematuria[ ] ; Dysuria [ ] ; Nocturia[ ]   Vascular: Pain in legs with walking [ ] ; Pain in feet with lying flat [ ] ; Non-healing sores [ ] ; Stroke [ ] ; TIA [ ] ; Slurred  speech [ ] ;  Neuro: Headaches[ ] ; Vertigo[ ] ; Seizures[ ] ; Paresthesias[ ] ;Blurred vision [ ] ; Diplopia [ ] ; Vision changes [ ]   Ortho/Skin: Arthritis [y]; Joint pain [y]; Muscle pain [ ] ; Joint swelling [ ] ; Back Pain [ ] ; Rash [ ]   Psych: Depression[ ] ; Anxiety[ ]   Heme: Bleeding problems [ ] ; Clotting disorders [ ] ; Anemia [ ]   Endocrine: Diabetes [ ] ; Thyroid dysfunction[ ]    Home Medications Prior to Admission medications   Medication Sig Start Date End Date Taking? Authorizing Provider  amiodarone (PACERONE) 100 MG tablet Take 2 tablets (200 mg total) by mouth daily. 02/17/16   Amy D Clegg, NP  atorvastatin (LIPITOR) 40 MG tablet Take 1 tablet (40 mg total) by mouth daily. 02/17/16 05/31/17  Amy D Ninfa Meeker, NP  ferrous sulfate 325 (65 FE) MG tablet Take 325 mg by mouth daily with breakfast. Reported on 12/16/2015    Historical Provider, MD  magnesium oxide (MAG-OX) 400 MG tablet Take 1 tablet (400 mg total) by mouth daily. 01/27/16   Amy D Ninfa Meeker, NP  metolazone (ZAROXOLYN) 2.5 MG tablet Take 1 tablet (2.5 mg total) by mouth 2 (two) times a week. Every Wednesday and Saturday morning. Take 1 extra tablet for weight 185 lbs or more. 05/11/16 08/09/16  Amy D Clegg, NP  oxyCODONE (OXY IR/ROXICODONE) 5 MG immediate release tablet Take 1  tablet (5 mg total) by mouth every 4 (four) hours as needed for moderate pain. 11/20/15   Newman Pies, MD  potassium chloride SA (K-DUR,KLOR-CON) 20 MEQ tablet Take 2 tablets (40 mEq total) by mouth 2 (two) times daily. Take extra tab on Wed and Sat with metolazone 05/22/16   Amy D Clegg, NP  torsemide (DEMADEX) 20 MG tablet Take 60 mg by mouth 2 (two) times daily.    Historical Provider, MD  warfarin (COUMADIN) 3 MG tablet Take 3 mg by mouth daily.    Historical Provider, MD    Past Medical History: Past Medical History:  Diagnosis Date  . AICD (automatic cardioverter/defibrillator) present   . Anxiety   . CAD (coronary artery disease)   . CHF (congestive heart  failure) ([place])   . DOE (dyspnea on exertion)   . Essential hypertension   . Family history of adverse reaction to anesthesia    "sister would go into seizures"  . Hyperlipidemia   . Ischemic cardiomyopathy    EF 20% by echo 2013  . Myocardial infarction acute 2003  . 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 Other pulmonary embolism and infarction    On coumadin  . Pulmonary hypertension    56 mm Hg  . Sleep apnea    "tried machine a couple nights; couldn't do it" (11/03/2015)  . Stroke [facility])    left side facial droop since; don't know when it was" (11/03/2015)  . Tricuspid regurgitation    Severe    Past Surgical History: Past Surgical History:  Procedure Laterality Date  . BURR HOLE Left 11/15/2015   Procedure: Left side burr holes;  Surgeon: Ashok Pall, MD;  Location: [facility] NEURO ORS;  Service: Neurosurgery;  Laterality: Left;  . CARDIAC CATHETERIZATION N/A 11/11/2015   Procedure: Right/Left Heart Cath and Coronary Angiography;  Surgeon: Jolaine Artist, MD;  Location: [facility] CV LAB;  Service: Cardiovascular;  Laterality: N/A;  . CARDIAC DEFIBRILLATOR PLACEMENT  11/09/06   SJM Epic II DR ICD implanted in [place], atrial lead no longer functions due to low impedance  . COLONOSCOPY WITH ESOPHAGOGASTRODUODENOSCOPY (EGD)  06/06/2012   Procedure: COLONOSCOPY WITH ESOPHAGOGASTRODUODENOSCOPY (EGD);  Surgeon: Rogene Houston, MD;  Location[facility];  Service: Endoscopy;  Laterality: N/A;  310  . CORONARY ANGIOPLASTY WITH STENT PLACEMENT  "<2003  . CORONARY ARTERY BYPASS GRAFT  2003   4 vessel  . IMPLANTABLE CARDIOVERTER DEFIBRILLATOR (ICD) GENERATOR CHANGE N/A 05/27/2012   Procedure: ICD GENERATOR CHANGE;  Surgeon: Thompson Grayer, MD;  Location: [facility] CATH LAB;  Service: Cardiovascular;  Laterality: N/A;  . IMPLANTABLE CARDIOVERTER DEFIBRILLATOR GENERATOR CHANGE  05/27/2012   Gen change.  SJM Assura VR.  [place] V lead  . IR GENERIC HISTORICAL  02/04/2016   IR US GUIDE VASC ACCESS RIGHT  02/04/2016 Arne Cleveland, MD [facility]  . IR GENERIC HISTORICAL  02/04/2016   IR FLUORO GUIDE CV LINE RIGHT 02/04/2016 Arne Cleveland, MD [facility]    Family History: Family History  Problem Relation Age of Onset  . Heart disease Mother     CAD  . Leukemia Father     Social History: Social History   Social History  . Marital status: Married    Spouse name: N/A  . Number of children: 4  . Years of education: N/A   Occupational History  . Disabled.    Social History Main Topics  . Smoking status: Former Smoker    Packs/day: 0.50    Years: 38.00  Types: Cigarettes    Start date: 12/30/1956    Quit date: 06/06/1995  . Smokeless tobacco: Never Used  . Alcohol use 1.8 oz/week    3 Cans of beer per week     Comment: 11/03/2015 "Quit from [id number]; weaning myself off again"  . Drug use: No  . Sexual activity: Not Currently   Other Topics Concern  . None   Social History Narrative   Lives with wife and brother in [place] and grandson in [place].      Allergies:  No Known Allergies  Objective:    Vital Signs:   Temp:  [98.2 F (36.8 C)] 98.2 F (36.8 C) (12/27 0841) Pulse Rate:  [61] 61 (12/27 0841) Resp:  [30] 30 (12/27 0841) BP: (107)/(75) 107/75 (12/27 0841) SpO2:  [100 %] 100 % (12/27 0841) Weight:  [210 lb (95.3 kg)] 210 lb (95.3 kg) (12/27 0841)   Filed Weights   05/31/16 0841  Weight: 210 lb (95.3 kg)    Physical Exam: General:  Elderly and chronically ill appearing. SOB at rest.  HEENT: Normal Neck: supple. JVD elevated to ear with prominent CV wave and increased superficial vascularity. Carotids 2+ bilat; no bruits. No lymphadenopathy or thyromegaly appreciated. Cor: PMI nondisplaced. RRR. 3/6 MR.  Lungs: CTAB, normal effort Abdomen: soft, NT, mildly distended. No HSM. +BS  Extremities: no cyanosis, clubbing, rash. 2+ edema into thighs. Trace edema into flanks.  Neuro: alert & oriented x 3, cranial nerves grossly intact. moves all 4  extremities w/o difficulty. Affect pleasant.  Telemetry:   Labs: Basic Metabolic Panel:  Recent Labs Lab 05/31/16 0846  NA 136  K 4.4  CL 100*  CO2 27  GLUCOSE 101*  BUN 38*  CREATININE 2.52*  CALCIUM 8.9    Liver Function Tests: No results for input(s): AST, ALT, ALKPHOS, BILITOT, PROT, ALBUMIN in the last 168 hours. No results for input(s): LIPASE, AMYLASE in the last 168 hours. No results for input(s): AMMONIA in the last 168 hours.  CBC:  Recent Labs Lab 05/31/16 0846  WBC 5.7  HGB 10.8*  HCT 33.7*  MCV 88.9  PLT PENDING    Cardiac Enzymes: No results for input(s): CKTOTAL, CKMB, CKMBINDEX, TROPONINI in the last 168 hours.  BNP: BNP (last 3 results)  Recent Labs  02/24/16 1500 03/23/16 1015 05/11/16 0958  BNP 314.7* 721.4* 898.0*    ProBNP (last 3 results) No results for input(s): PROBNP in the last 8760 hours.   CBG: No results for input(s): GLUCAP in the last 168 hours.  Coagulation Studies: No results for input(s): LABPROT, INR in the last 72 hours.  Other results: EKG: NSR 66 bpm, Old RBBB  Imaging: Dg Chest 2 View  Result Date: 05/31/2016 CLINICAL DATA:  Cough, congestion for 2 days, former smoking history EXAM: CHEST  2 VIEW COMPARISON:  Chest x-ray of 02/04/2016 FINDINGS: There is opacity at the right lung base which may be due to atelectasis although a right lower lobe pneumonia and possible small right effusion is a definite consideration. In addition, there is moderate cardiomegaly present with slightly prominent interstitial markings suggesting mild superimposed interstitial edema, with a small amount of fluid in the fissures. AICD leads remain. Median sternotomy sutures are noted. IMPRESSION: 1. Right lower lobe opacity consistent with atelectasis or pneumonia and possible small right effusion. 2. Suspect mild superimposed interstitial edema.  Cardiomegaly. Electronically Signed   By: Ivar Drape M.D.   On: 05/31/2016 09:03       Assessment/Plan  1. Acute on chronic biventricular CHF - EF 10/08/15 15-20% with severe RV dilation and decreased function - Marked volume overloaded on exam and with NYHA class IV symptoms currently. Up > 20 lbs. Suspect some degree of low output HF, if not primarily RV failure. BNP 1700 - Start milrinone 0.25 mcg/kg/min with recent low output and marked volume overload. Will place PICC for CVP and Coox.  - Diurese with lasix 80 mg q8hrs for now.  - No ACE/ARB/Spiro with AKI.  - No BB with low output.  2. ARF on CKD stage III - Baseline ~ 1.4 - 1.6. 2.52 on admission.  - Follow closely with diuresis.  3. Hx of CVA - Continue coumadin. INR recently supratherapeutic. Repeat pending.  - Coumadin per pharmacy 4. SVT - Continue amio 200 mg BID. Well place on tele.   Length of Stay: 0  Annamaria Helling 05/31/2016, 9:49 AM  Advanced Heart Failure Team Pager [phone] (M-F; 7a - 4p)  Please contact [facility] for night-coverage after hours (4p -7a ) and weekends on [email or web address]  Patient seen and examined with Oda Kilts, PA-C. We discussed all aspects of the encounter. I agree with the assessment and plan as stated above.   60 with end-stage biventricular HF now presents with recurrent low-output HF with marked volume overload and AKI. Will admit for IV lasix with milrinone support. Continue coumadin. Place PICC to follow CVP and co-ox.   Trammell Bowden,MD 6:05 PM

## 2016-05-31 NOTE — ED Notes (Signed)
Ordered pt a dinner tray 

## 2016-05-31 NOTE — ED Notes (Signed)
Have spoke with dr Jeneen Rinks about changing picc line order to IR, IV team states they are unable to do his Picc placement.

## 2016-05-31 NOTE — Progress Notes (Signed)
ANTICOAGULATION CONSULT NOTE - Initial Consult  Pharmacy Consult for Coumadin Indication:hx PE and CVA  No Known Allergies  Patient Measurements: Height: 5\' 11"  (180.3 cm) Weight: 210 lb (95.3 kg) IBW/kg (Calculated) : 75.3   Vital Signs: Temp: 98.2 F (36.8 C) (12/27 0841) Temp Source: Oral (12/27 0841) BP: 105/74 (12/27 1100) Pulse Rate: 74 (12/27 1100)  Labs:  Recent Labs  05/31/16 0846 05/31/16 1214  HGB 10.8*  --   HCT 33.7*  --   PLT 98*  --   LABPROT  --  27.1*  INR  --  2.45  CREATININE 2.52*  --     Estimated Creatinine Clearance: 32.1 mL/min (by C-G formula based on SCr of 2.52 mg/dL (H)).   Medical History: Past Medical History:  Diagnosis Date  . AICD (automatic cardioverter/defibrillator) present   . Anxiety   . CAD (coronary artery disease)   . CHF (congestive heart failure) ([place])   . DOE (dyspnea on exertion)   . Essential hypertension   . Family history of adverse reaction to anesthesia    "sister would go into seizures"  . Hyperlipidemia   . Ischemic cardiomyopathy    EF 20% by echo 2013  . Myocardial infarction acute 2003  . 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 Other pulmonary embolism and infarction    On coumadin  . Pulmonary hypertension    56 mm Hg  . Sleep apnea    "tried machine a couple nights; couldn't do it" (11/03/2015)  . Stroke [facility])    left side facial droop since; don't know when it was" (11/03/2015)  . Tricuspid regurgitation    Severe   Assessment: 70yom on coumadin pta for hx PE and CVA, being admitted with acute heart failure. INR on admission therapeutic at 2.45. Home amiodarone continued. LFTs wnl.  PTA dose: 3mg  daily except 4.5mg  on Monday/Thursday  Goal of Therapy:  INR 2-3 Monitor platelets by anticoagulation protocol: Yes   Plan:  1) Coumadin 3mg  x 1 2) Daily INR  Rick [name] 05/31/2016,1:25 PM

## 2016-05-31 NOTE — ED Triage Notes (Signed)
Pt presents from heart failure clinic for SOB x 1 week. Pt. Denies CP, reports pain to abd and legs. Pt wife states he "has lots of fluid on stomach and legs." Pt dyspnea with exertion, AxO x4. Pt. Has expiratory wheezes in bilateral bases.

## 2016-05-31 NOTE — Progress Notes (Signed)
ED RN made aware that IR placed the PICC line during last admission due to difficulty to thread as seen in [place]. RN to notify MD.

## 2016-05-31 NOTE — Procedures (Signed)
Interventional Radiology Procedure Note  Procedure: Placement of RIGHT arm PICC. 37 cm, DL PowerPICC.  Tip at cvaoatrial junction and ready for use.   Complications: None  Estimated Blood Loss: 0  Recommendations:  - ROutine line care  Signed,  Criselda Peaches, MD

## 2016-05-31 NOTE — ED Notes (Signed)
Pt transported to IR 

## 2016-05-31 NOTE — Progress Notes (Signed)
Late Entry:  Pt arrived to clinic appt early c/o increased SOB, pt wt is up to 210 lb, 28 lb up from 2 weeks ago.  He states he has been taking increased Torsemide dose of 60 mg BID since advised to do so last week and has been taking his Metolazone twice a week and even took it last night but does not feel he is urinated that much.  Pt is SOB at rest, LE edema up to his thighs and abd is distended, discussed w/Andy Tillery, PA and pt taken to ER for further eval and admit.

## 2016-05-31 NOTE — ED Provider Notes (Signed)
[place] DEPT Provider Note   CSN: [id number] Arrival date & time: 05/31/16  [id number]     History   Chief Complaint Chief Complaint  Patient presents with  . Shortness of Breath    HPI Rick [name] is a 70 y.o. male. He presents with difficulty breathing and a history of CHF. He reports a dry weight of 182, current weight of 210. Seen at heart failure clinic this morning and referred here for admission for CHF exacerbation.  Patient has history of ischemic cardiomyopathy, status post CABG 2003. Echo 2016 with EF 25% and DDD. Has ICD by Dr. Rayann Heman.  Followed with CHF clinic. Weight 182 pounds on 12 7. Has had adjustments of his twice a day torsemide twice since then. Continues to have progressive edema and shortness of breath over 3-4 days.  She has  HPI  Past Medical History:  Diagnosis Date  . AICD (automatic cardioverter/defibrillator) present   . Anxiety   . CAD (coronary artery disease)   . CHF (congestive heart failure) ([place])   . DOE (dyspnea on exertion)   . Essential hypertension   . Family history of adverse reaction to anesthesia    "sister would go into seizures"  . Hyperlipidemia   . Ischemic cardiomyopathy    EF 20% by echo 2013  . Myocardial infarction acute 2003  . 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 Other pulmonary embolism and infarction    On coumadin  . Pulmonary hypertension    56 mm Hg  . Sleep apnea    "tried machine a couple nights; couldn't do it" (11/03/2015)  . Stroke [facility])    left side facial droop since; don't know when it was" (11/03/2015)  . Tricuspid regurgitation    Severe    Patient Active Problem List   Diagnosis Date Noted  . CHF (congestive heart failure) ([place]) 05/31/2016  . PSVT (paroxysmal supraventricular tachycardia) ([place])   . CKD (chronic kidney disease) stage 3, GFR 30-59 ml/min   . Palliative care encounter   . Chronic combined systolic and diastolic CHF (congestive heart failure) ([place])   . Coronary artery disease  involving coronary bypass graft of native heart without angina pectoris   . Subdural hematoma ([place]) 11/13/2015  . CAD (coronary artery disease) of artery bypass graft 10/21/2015  . CAD (coronary artery disease) 10/21/2015  . Anasarca 10/07/2015  . Normocytic anemia 10/07/2015  . Leg pain 11/13/2014  . Encounter for therapeutic drug monitoring 08/05/2013  . Chronic passive hepatic congestion 07/09/2012  . Chest pain 07/01/2012  . Jaundice 05/15/2012  . Syncope 04/12/2012  . Abnormal liver function tests 04/12/2012  . Chronic systolic heart failure ([place]) 04/12/2012  . Edema 04/12/2012  . Sleep apnea 04/12/2012  . Automatic implantable cardioverter-defibrillator in situ 03/12/2012  . Encounter for long-term (current) use of anticoagulants 12/18/2011  . Essential hypertension   . H/O atherosclerotic cardiovascular disease   . Ischemic cardiomyopathy   . Other pulmonary embolism and infarction   . Hyperlipidemia   . DOE (dyspnea on exertion)     Past Surgical History:  Procedure Laterality Date  . BURR HOLE Left 11/15/2015   Procedure: Left side burr holes;  Surgeon: Ashok Pall, MD;  Location: [place] NEURO ORS;  Service: Neurosurgery;  Laterality: Left;  . CARDIAC CATHETERIZATION N/A 11/11/2015   Procedure: Right/Left Heart Cath and Coronary Angiography;  Surgeon: Jolaine Artist, MD;  Location: [place] CV LAB;  Service: Cardiovascular;  Laterality: N/A;  . CARDIAC DEFIBRILLATOR PLACEMENT  11/09/06  SJM Epic II DR ICD implanted in [place], atrial lead no longer functions due to low impedance  . COLONOSCOPY WITH ESOPHAGOGASTRODUODENOSCOPY (EGD)  06/06/2012   Procedure: COLONOSCOPY WITH ESOPHAGOGASTRODUODENOSCOPY (EGD);  Surgeon: Rogene Houston, MD;  [facility];  Service: Endoscopy;  Laterality: N/A;  310  . CORONARY ANGIOPLASTY WITH STENT PLACEMENT  "<2003  . CORONARY ARTERY BYPASS GRAFT  2003   4 vessel  . IMPLANTABLE CARDIOVERTER DEFIBRILLATOR (ICD) GENERATOR  CHANGE N/A 05/27/2012   Procedure: ICD GENERATOR CHANGE;  Surgeon: Thompson Grayer, MD;  Location: [facility] CATH LAB;  Service: Cardiovascular;  Laterality: N/A;  . IMPLANTABLE CARDIOVERTER DEFIBRILLATOR GENERATOR CHANGE  05/27/2012   Gen change.  SJM Assura VR.  Riata V lead  . IR GENERIC HISTORICAL  02/04/2016   IR US GUIDE VASC ACCESS RIGHT 02/04/2016 Arne Cleveland, MD [facility]-INTERV RAD  . IR GENERIC HISTORICAL  02/04/2016   IR FLUORO GUIDE CV LINE RIGHT 02/04/2016 Arne Cleveland, MD [facility]-INTER[facility] RAD       Home Medications    Prior to Admission medications   Medication Sig Start Date End Date Taking? Authorizing Provider  amiodarone (PACERONE) 100 MG tablet Take 2 tablets (200 mg total) by mouth daily. 02/17/16  Yes Amy D Clegg, NP  atorvastatin (LIPITOR) 40 MG tablet Take 1 tablet (40 mg total) by mouth daily. 02/17/16 05/31/17 Yes Amy D Clegg, NP  ferrous sulfate 325 (65 FE) MG tablet Take 325 mg by mouth daily with breakfast. Reported on 12/16/2015   Yes Historical Provider, MD  magnesium oxide (MAG-OX) 400 MG tablet Take 1 tablet (400 mg total) by mouth daily. 01/27/16  Yes Amy D Clegg, NP  metolazone (ZAROXOLYN) 2.5 MG tablet Take 1 tablet (2.5 mg total) by mouth 2 (two) times a week. Every Wednesday and Saturday morning. Take 1 extra tablet for weight 185 lbs or more. 05/11/16 08/09/16 Yes Amy D Clegg, NP  oxyCODONE (OXY IR/ROXICODONE) 5 MG immediate release tablet Take 1 tablet (5 mg total) by mouth every 4 (four) hours as needed for moderate pain. 11/20/15  Yes Newman Pies, MD  polyethylene glycol [facility] / Floria Raveling) packet Take 17 g by mouth daily as needed for mild constipation.   Yes Historical Provider, MD  potassium chloride SA (K-DUR,KLOR-CON) 20 MEQ tablet Take 2 tablets (40 mEq total) by mouth 2 (two) times daily. Take extra tab on Wed and Sat with metolazone 05/22/16  Yes Amy D Clegg, NP  torsemide (DEMADEX) 20 MG tablet Take 60 mg by mouth 2 (two) times daily.   Yes Historical Provider, MD    warfarin (COUMADIN) 3 MG tablet Take 3-4.5 mg by mouth See admin instructions. Pt takes 3mg  by mouth once daily every day except Monday and Thursday - pt takes 4.5mg  on Monday and Thursday   Yes Historical Provider, MD    Family History Family History  Problem Relation Age of Onset  . Heart disease Mother     CAD  . Leukemia Father     Social History Social History  Substance Use Topics  . Smoking status: Former Smoker    Packs/day: 0.50    Years: 38.00    Types: Cigarettes    Start date: 12/30/1956    Quit date: 06/06/1995  . Smokeless tobacco: Never Used  . Alcohol use 1.8 oz/week    3 Cans of beer per week     Comment: 11/03/2015 "Quit from [id number]; weaning myself off again"     Allergies   Patient has no known  allergies.   Review of Systems Review of Systems  Constitutional: Negative for appetite change, chills, diaphoresis, fatigue and fever.  HENT: Negative for mouth sores, sore throat and trouble swallowing.   Eyes: Negative for visual disturbance.  Respiratory: Positive for shortness of breath. Negative for cough, chest tightness and wheezing.   Cardiovascular: Positive for leg swelling. Negative for chest pain.       No chest pain. States his legs are heavy, and" tight".  Gastrointestinal: Negative for abdominal distention, abdominal pain, diarrhea, nausea and vomiting.  Endocrine: Negative for polydipsia, polyphagia and polyuria.  Genitourinary: Negative for dysuria, frequency and hematuria.  Musculoskeletal: Negative for gait problem.  Skin: Negative for color change, pallor and rash.  Neurological: Negative for dizziness, syncope, light-headedness and headaches.  Hematological: Does not bruise/bleed easily.  Psychiatric/Behavioral: Negative for behavioral problems and confusion.     Physical Exam Updated Vital Signs BP 107/75 (BP Location: Right Arm)   Pulse 61   Temp 98.2 F (36.8 C) (Oral)   Resp (!) 30   Ht 5\' 11"  (1.803 m)   Wt 210 lb (95.3  kg)   SpO2 100%   BMI 29.29 kg/m   Physical Exam  Constitutional: He is oriented to person, place, and time. He appears well-developed and well-nourished. No distress.  Very pleasant 70 year old male. Awake and alert. Dyspneic with conversation. Well oxygenated at 98% on room air.  HENT:  Head: Normocephalic.  Eyes: Conjunctivae are normal. Pupils are equal, round, and reactive to light. No scleral icterus.  Neck: Normal range of motion. Neck supple. No thyromegaly present.  Marked JVD sitting upright.  Cardiovascular: Normal rate and regular rhythm.  Exam reveals no gallop and no friction rub.   No murmur heard. Sinus. No gallops noted.  Pulmonary/Chest: Effort normal and breath sounds normal. No respiratory distress. He has no wheezes. He has no rales.  Bibasal crackles. Wheezing in all fields.  Abdominal: Soft. Bowel sounds are normal. He exhibits no distension. There is no tenderness. There is no rebound.  Musculoskeletal: Normal range of motion.  Neurological: He is alert and oriented to person, place, and time.  Skin: Skin is warm and dry. No rash noted.  Tight bilateral lower extremity edema.  Psychiatric: He has a normal mood and affect. His behavior is normal.     ED Treatments / Results  Labs (all labs ordered are listed, but only abnormal results are displayed) Labs Reviewed  BASIC METABOLIC PANEL - Abnormal; Notable for the following:       Result Value   Chloride 100 (*)    Glucose, Bld 101 (*)    BUN 38 (*)    Creatinine, Ser 2.52 (*)    GFR calc non Af Amer 24 (*)    GFR calc Af Amer 28 (*)    All other components within normal limits  CBC - Abnormal; Notable for the following:    RBC 3.79 (*)    Hemoglobin 10.8 (*)    HCT 33.7 (*)    RDW 17.2 (*)    Platelets 98 (*)    All other components within normal limits  BRAIN NATRIURETIC PEPTIDE - Abnormal; Notable for the following:    B Natriuretic Peptide 1,763.1 (*)    All other components within normal  limits  PROTIME-INR  HEPATIC FUNCTION PANEL  COOXEMETRY PANEL  Randolm Idol, ED    EKG  EKG Interpretation  Date/Time:  Wednesday May 31 2016 08:41:13 EST Ventricular Rate:  66 PR Interval:  162  QRS Duration: 194 QT Interval:  584 QTC Calculation: 612 R Axis:   -120 Text Interpretation:  Normal sinus rhythm Right bundle branch block -- Noted on prior Anteroseptal infarct , age undetermined Abnormal ECG Confirmed by Jeneen Rinks  MD, [place] ([id number]) on 05/31/2016 8:44:46 AM Also confirmed by Jeneen Rinks  MD, [place] ([id number]), editor [place], Leda Gauze [phone])  on 05/31/2016 9:15:09 AM       Radiology Dg Chest 2 View  Result Date: 05/31/2016 CLINICAL DATA:  Cough, congestion for 2 days, former smoking history EXAM: CHEST  2 VIEW COMPARISON:  Chest x-ray of 02/04/2016 FINDINGS: There is opacity at the right lung base which may be due to atelectasis although a right lower lobe pneumonia and possible small right effusion is a definite consideration. In addition, there is moderate cardiomegaly present with slightly prominent interstitial markings suggesting mild superimposed interstitial edema, with a small amount of fluid in the fissures. AICD leads remain. Median sternotomy sutures are noted. IMPRESSION: 1. Right lower lobe opacity consistent with atelectasis or pneumonia and possible small right effusion. 2. Suspect mild superimposed interstitial edema.  Cardiomegaly. Electronically Signed   By: Ivar Drape M.D.   On: 05/31/2016 09:03    Procedures Procedures (including critical care time)  Medications Ordered in ED Medications  milrinone (PRIMACOR) 20 MG/100 ML (0.2 mg/mL) infusion (not administered)  furosemide (LASIX) injection 80 mg (not administered)  furosemide (LASIX) injection 80 mg (80 mg Intravenous Given 05/31/16 1032)  albuterol (PROVENTIL) (2.5 MG/3ML) 0.083% nebulizer solution 2.5 mg (2.5 mg Nebulization Given 05/31/16 1029)     Initial Impression / Assessment and Plan / ED  Course  I have reviewed the triage vital signs and the nursing notes.  Pertinent labs & imaging results that were available during my care of the patient were reviewed by me and considered in my medical decision making (see chart for details).  Clinical Course     IV Lasix given here. Seen by cardiology. We'll be admitted. Plan diuresis, milrinone drip. AKI noted.  Final Clinical Impressions(s) / ED Diagnoses   Final diagnoses:  Congestive heart failure, unspecified congestive heart failure chronicity, unspecified congestive heart failure type [facility])    New Prescriptions New Prescriptions   No medications on file     Tanna Furry, MD 05/31/16 1050

## 2016-05-31 NOTE — ED Notes (Signed)
Ordered diet tray 

## 2016-06-01 DIAGNOSIS — Z7189 Other specified counseling: Secondary | ICD-10-CM

## 2016-06-01 DIAGNOSIS — R0602 Shortness of breath: Secondary | ICD-10-CM

## 2016-06-01 LAB — BASIC METABOLIC PANEL
ANION GAP: 11 (ref 5–15)
ANION GAP: 11 (ref 5–15)
BUN: 39 mg/dL — ABNORMAL HIGH (ref 6–20)
BUN: 37 mg/dL — AB (ref 6–20)
CALCIUM: 8.5 mg/dL — AB (ref 8.9–10.3)
CHLORIDE: 103 mmol/L (ref 101–111)
CO2: 24 mmol/L (ref 22–32)
CO2: 22 mmol/L (ref 22–32)
Calcium: 8.6 mg/dL — ABNORMAL LOW (ref 8.9–10.3)
Chloride: 103 mmol/L (ref 101–111)
Creatinine, Ser: 2.3 mg/dL — ABNORMAL HIGH (ref 0.61–1.24)
Creatinine, Ser: 2.22 mg/dL — ABNORMAL HIGH (ref 0.61–1.24)
GFR calc Af Amer: 33 mL/min — ABNORMAL LOW (ref >60)
GFR calc non Af Amer: 27 mL/min — ABNORMAL LOW (ref >60)
GFR, EST AFRICAN AMERICAN: 31 mL/min — AB (ref >60)
GFR, EST NON AFRICAN AMERICAN: 28 mL/min — AB (ref >60)
GLUCOSE: 207 mg/dL — AB (ref 65–99)
Glucose, Bld: 152 mg/dL — ABNORMAL HIGH (ref 65–99)
POTASSIUM: 3.7 mmol/L (ref 3.5–5.1)
POTASSIUM: 3.5 mmol/L (ref 3.5–5.1)
SODIUM: 136 mmol/L (ref 135–145)
Sodium: 138 mmol/L (ref 135–145)

## 2016-06-01 LAB — MRSA PCR SCREENING: MRSA BY PCR: NEGATIVE

## 2016-06-01 LAB — PROTIME-INR
INR: 2.6
Prothrombin Time: 28.4 seconds — ABNORMAL HIGH (ref 11.4–15.2)

## 2016-06-01 LAB — AMMONIA: Ammonia: 34 umol/L (ref 9–35)

## 2016-06-01 LAB — MAGNESIUM: Magnesium: 2.1 mg/dL (ref 1.7–2.4)

## 2016-06-01 LAB — COOXEMETRY PANEL
Carboxyhemoglobin: 1.2 % (ref 0.5–1.5)
Carboxyhemoglobin: 1.5 % (ref 0.5–1.5)
Carboxyhemoglobin: 1.8 % — ABNORMAL HIGH (ref 0.5–1.5)
METHEMOGLOBIN: 1.1 % (ref 0.0–1.5)
Methemoglobin: 1.1 % (ref 0.0–1.5)
Methemoglobin: 1.3 % (ref 0.0–1.5)
O2 SAT: 39.2 %
O2 SAT: 41 %
O2 Saturation: 49.2 %
TOTAL HEMOGLOBIN: 12.9 g/dL (ref 12.0–16.0)
TOTAL HEMOGLOBIN: 9.7 g/dL — AB (ref 12.0–16.0)
Total hemoglobin: 7.2 g/dL — ABNORMAL LOW (ref 12.0–16.0)

## 2016-06-01 MED ORDER — LEVOFLOXACIN IN D5W 750 MG/150ML IV SOLN
750.0000 mg | INTRAVENOUS | Status: AC
  Administered 2016-06-01 – 2016-06-07 (×4): 750 mg via INTRAVENOUS
  Filled 2016-06-01 (×5): qty 150

## 2016-06-01 MED ORDER — POTASSIUM CHLORIDE CRYS ER 20 MEQ PO TBCR
20.0000 meq | EXTENDED_RELEASE_TABLET | Freq: Two times a day (BID) | ORAL | Status: DC
  Administered 2016-06-01 (×2): 20 meq via ORAL
  Filled 2016-06-01 (×2): qty 1

## 2016-06-01 MED ORDER — WARFARIN SODIUM 2 MG PO TABS
3.0000 mg | ORAL_TABLET | Freq: Once | ORAL | Status: AC
  Administered 2016-06-01: 3 mg via ORAL
  Filled 2016-06-01: qty 1.5

## 2016-06-01 MED ORDER — GUAIFENESIN-DM 100-10 MG/5ML PO SYRP: 5.0000 mL | ORAL_SOLUTION | ORAL | Status: DC | PRN

## 2016-06-01 MED ORDER — POTASSIUM CHLORIDE CRYS ER 20 MEQ PO TBCR
60.0000 meq | EXTENDED_RELEASE_TABLET | ORAL | Status: AC
  Administered 2016-06-01: 60 meq via ORAL
  Filled 2016-06-01: qty 3

## 2016-06-01 MED ORDER — MILRINONE LACTATE IN DEXTROSE 20-5 MG/100ML-% IV SOLN
0.5000 ug/kg/min | INTRAVENOUS | Status: DC
  Administered 2016-06-01 – 2016-06-02 (×2): 0.375 ug/kg/min via INTRAVENOUS
  Filled 2016-06-01 (×2): qty 100

## 2016-06-01 MED ORDER — ALBUTEROL SULFATE (2.5 MG/3ML) 0.083% IN NEBU
2.5000 mg | INHALATION_SOLUTION | RESPIRATORY_TRACT | Status: DC | PRN
  Administered 2016-06-01: 2.5 mg via RESPIRATORY_TRACT
  Filled 2016-06-01: qty 3

## 2016-06-01 MED ORDER — AMIODARONE HCL IN DEXTROSE 360-4.14 MG/200ML-% IV SOLN
60.0000 mg/h | INTRAVENOUS | Status: AC
  Administered 2016-06-01: 60 mg/h via INTRAVENOUS
  Filled 2016-06-01: qty 200

## 2016-06-01 MED ORDER — AMIODARONE LOAD VIA INFUSION
150.0000 mg | Freq: Once | INTRAVENOUS | Status: AC
  Administered 2016-06-01: 150 mg via INTRAVENOUS
  Filled 2016-06-01: qty 83.34

## 2016-06-01 MED ORDER — ALBUTEROL SULFATE (2.5 MG/3ML) 0.083% IN NEBU
2.5000 mg | INHALATION_SOLUTION | Freq: Once | RESPIRATORY_TRACT | Status: AC
  Administered 2016-06-01: 2.5 mg via RESPIRATORY_TRACT
  Filled 2016-06-01: qty 3

## 2016-06-01 MED ORDER — AMIODARONE HCL IN DEXTROSE 360-4.14 MG/200ML-% IV SOLN
30.0000 mg/h | INTRAVENOUS | Status: DC
  Administered 2016-06-02 – 2016-06-06 (×9): 30 mg/h via INTRAVENOUS
  Filled 2016-06-01 (×11): qty 200

## 2016-06-01 NOTE — Consult Note (Signed)
Consultation Note Date: 06/01/2016   Patient Name: Rick [name]  DOB: 04/04/1946  MRN: [id number]  Age / Sex: 70 y.o., male  PCP: Rick Squibb, MD Referring Physician: Jolaine Artist, MD  Reason for Consultation: Establishing goals of care  HPI/Patient Profile: 70 y.o. male  with past medical history of ischemic cardiomyopathy (s/p CABG 2003), CHF (EF 25% in 2016),   admitted on 05/31/2016 with CHF exacerbation with volume overload and recurrent low output. He is diuresing Palliative medicine consulted for [place].  Clinical Assessment and Goals of Care: Met with patient, his wifeLetta [name], and daughterMarlowe [name]. Patient has seen palliative medicine in the past and remembers these visits and discussions. All are aware that patient has end stage illness. He is SOB at rest. He gets around "ok" at homes, requires some assistance with ADL's. He is having increasing frequencies of exacerbations and hospitalizations. Discussed GOC- patient notes he "would like to go to work" but he understands that he will not work again. His daughter and wife appear to appreciate the seriousness of his illness, but patient remains with little insight. However, after discussing code status options he did verbalize he desires to be DNR. We also discussed Hospice services at home. All agree that primary GOC is to stabilize patient for discharge home with hospice services with goals to remain home rather than return to the hospital for future exacerbations.   Primary Decision Maker PATIENT with assistance of spouse and daughter    SUMMARY OF RECOMMENDATIONS -DNR -Continue current care, do not escalate -When patient stable, d/c home with hospice -Morphine intesol 2.56m SL for SOB, premed for exertional activities    Code Status/Advance Care Planning:  DNR    Symptom Management:   As above  Palliative Prophylaxis:   Frequent  assessment for SOB, use opioid as premed for exertional activity including eating and ADL's  Additional Recommendations (Limitations, Scope, Preferences):  No Surgical Procedures and No Tracheostomy  Psycho-social/Spiritual:   Desire for further Chaplaincy support:No  Additional Recommendations: Education on Hospice  Prognosis:    < 6 months due to end stage CHF  Discharge Planning: Home with Hospice  Primary Diagnoses: Present on Admission: . CHF NYHA class IV, acute, systolic ([place]   I have reviewed the medical record, interviewed the patient and family, and examined the patient. The following aspects are pertinent.  Past Medical History:  Diagnosis Date  . AICD (automatic cardioverter/defibrillator) present   . Anxiety   . CAD (coronary artery disease)   . CH[place] ([place]estive heart failure) ([place]   . DOE (dyspnea on exertion)   . Essential hypertension   . Family history of adverse reaction to anesthesia    "sister would go into seizures"  . Hyperlipidemia   . Ischemic cardiomyopathy    EF 20% by echo 2013  . Myocardial infarction acute 2003  . Non-alcoholic cirrhosis ([place]   . Other pulmonary embolism and infarction    On coumadin  . Pulmonary hypertension    56 mm Hg  .  Sleep apnea    "tried machine a couple nights; couldn't do it" (11/03/2015)  . Stroke [facility])    left side facial droop since; don't know when it was" (11/03/2015)  . Tricuspid regurgitation    Severe   Social History   Social History  . Marital status: Married    Spouse name: N/A  . Number of children: 4  . Years of education: N/A   Occupational History  . Disabled.    Social History Main Topics  . Smoking status: Former Smoker    Packs/day: 0.50    Years: 38.00    Types: Cigarettes    Start date: 12/30/1956    Quit date: 06/06/1995  . Smokeless tobacco: Never Used  . Alcohol use 1.8 oz/week    3 Cans of beer per week     Comment: 11/03/2015 "Quit from 0160-1093; weaning myself off  again"  . Drug use: No  . Sexual activity: Not Currently   Other Topics Concern  . None   Social History Narrative   Lives with wife and brother in [place] and grandson in [place].     Family History  Problem Relation Age of Onset  . Heart disease Mother     CAD  . Leukemia Father    Scheduled Meds: . atorvastatin  40 mg Oral Daily  . ferrous sulfate  325 mg Oral Q breakfast  . furosemide  80 mg Intravenous Q8H  . levofloxacin (LEVAQUIN) IV  750 mg Intravenous Q48H  . magnesium oxide  400 mg Oral Daily  . potassium chloride  20 mEq Oral BID  . sodium chloride flush  3 mL Intravenous Q12H  . warfarin  3 mg Oral ONCE-1800  . Warfarin - Pharmacist Dosing Inpatient   Does not apply q1800   Continuous Infusions: . amiodarone 60 mg/hr (06/01/16 1119)   Followed by  . amiodarone    . milrinone 0.375 mcg/kg/min (06/01/16 0740)   PRN Meds:.sodium chloride, acetaminophen, albuterol, guaiFENesin-dextromethorphan, ondansetron (ZOFRAN) IV, sodium chloride flush Medications Prior to Admission:  Prior to Admission medications   Medication Sig Start Date End Date Taking? Authorizing Provider  amiodarone (PACERONE) 100 MG tablet Take 2 tablets (200 mg total) by mouth daily. 02/17/16  Yes Rick D Clegg, NP  atorvastatin (LIPITOR) 40 MG tablet Take 1 tablet (40 mg total) by mouth daily. 02/17/16 05/31/17 Yes Rick D Clegg, NP  ferrous sulfate 325 (65 FE) MG tablet Take 325 mg by mouth daily with breakfast. Reported on 12/16/2015   Yes Historical Provider, MD  magnesium oxide (MAG-OX) 400 MG tablet Take 1 tablet (400 mg total) by mouth daily. 01/27/16  Yes Rick D Clegg, NP  metolazone (ZAROXOLYN) 2.5 MG tablet Take 1 tablet (2.5 mg total) by mouth 2 (two) times a week. Every Wednesday and Saturday morning. Take 1 extra tablet for weight 185 lbs or more. 05/11/16 08/09/16 Yes Rick D Clegg, NP  oxyCODONE (OXY IR/ROXICODONE) 5 MG immediate release tablet Take 1 tablet (5 mg total) by mouth every 4 (four)  hours as needed for moderate pain. 11/20/15  Yes Rick Pies, MD  polyethylene glycol [facility] / Rick [name]) packet Take 17 g by mouth daily as needed for mild constipation.   Yes Historical Provider, MD  potassium chloride SA (K-DUR,KLOR-CON) 20 MEQ tablet Take 2 tablets (40 mEq total) by mouth 2 (two) times daily. Take extra tab on Wed and Sat with metolazone 05/22/16  Yes Rick D Clegg, NP  torsemide (DEMADEX) 20 MG tablet Take 60  mg by mouth 2 (two) times daily.   Yes Historical Provider, MD  warfarin (COUMADIN) 3 MG tablet Take 3-4.5 mg by mouth See admin instructions. Pt takes 96m by mouth once daily every day except Monday and Thursday - pt takes 4.542mon Monday and Thursday   Yes Historical Provider, MD   No Known Allergies Review of Systems  Physical Exam  Constitutional: He is oriented to person, place, and time. He appears well-developed and well-nourished. No distress.  Eyes: EOM are normal.  Cardiovascular: Normal rate and intact distal pulses.   Pulmonary/Chest: He has rales.  Labored when talking  Musculoskeletal: He exhibits edema.  Neurological: He is alert and oriented to person, place, and time.  Skin: Skin is warm and dry.    Vital Signs: BP 99/68   Pulse 68   Temp 97.3 F (36.3 C) (Oral)   Resp (!) 22   Ht 5' 11"  (1.803 m)   Wt 91.4 kg (201 lb 9.6 oz)   SpO2 98%   BMI 28.12 kg/m  Pain Assessment: No/denies pain POSS *See Group Information*: 1-Acceptable,Awake and alert Pain Score: 0-No pain   SpO2: SpO2: 98 % O2 Device:SpO2: 98 % O2 Flow Rate: .O2 Flow Rate (L/min): 2.5 L/min  IO: Intake/output summary:  Intake/Output Summary (Last 24 hours) at 06/01/16 1431 Last data filed at 06/01/16 1300  Gross per 24 hour  Intake           545.99 ml  Output             5450 ml  Net         -4904.01 ml    LBM:   Baseline Weight: Weight: 95.3 kg (210 lb) Most recent weight: Weight: 91.4 kg (201 lb 9.6 oz)     Palliative Assessment/Data: PPS: 50%     Thank  you for this consult. Palliative medicine will continue to follow and assist as needed.   Time In: 1300 Time Out: 1445 Time Total: 75 minutes Greater than 50%  of this time was spent counseling and coordinating care related to the above assessment and plan.  Signed by: KaMariana KaufmanAGNP-C Palliative Medicine    Please contact Palliative Medicine Team phone at [phone] questions and concerns.  For individual provider: See AmShea Evans

## 2016-06-01 NOTE — Progress Notes (Signed)
Pt more having more dyspnea than he has previously today.  Still has upper airway wheezing noted.  Hr and O2 sat remain unchanged.  Giving pt nebulizer at this time.

## 2016-06-01 NOTE — Progress Notes (Signed)
ANTICOAGULATION CONSULT NOTE - Follow Up Consult  Pharmacy Consult for Coumadin Indication:hx PE and CVA  No Known Allergies  Patient Measurements: Height: 5\' 11"  (180.3 cm) Weight: 201 lb 9.6 oz (91.4 kg) IBW/kg (Calculated) : 75.3   Vital Signs: Temp: 98.1 F (36.7 C) (12/28 0736) Temp Source: Oral (12/28 0736) BP: 121/87 (12/28 0736) Pulse Rate: 80 (12/28 0736)  Labs:  Recent Labs  05/31/16 0846 05/31/16 1214 06/01/16 0441  HGB 10.8*  --   --   HCT 33.7*  --   --   PLT 98*  --   --   LABPROT  --  27.1* 28.4*  INR  --  2.45 2.60  CREATININE 2.52*  --  2.30*    Estimated Creatinine Clearance: 34.5 mL/min (by C-G formula based on SCr of 2.3 mg/dL (H)).  Assessment: 70yom continues on coumadin for hx PE and CVA. INR therapeutic on home dose at 2.6. However, levaquin added today for pneumonia and he is being loaded with IV amiodarone for NSVT, both which can potentiate INR.  Will empirically lower today's coumadin dose.  PTA dose: 3mg  daily except 4.5mg  on Monday/Thursday  Goal of Therapy:  INR 2-3 Monitor platelets by anticoagulation protocol: Yes   Plan:  1) Coumadin 3mg  x 1 2) Daily INR  Deboraha Sprang 06/01/2016,11:00 AM

## 2016-06-01 NOTE — Progress Notes (Addendum)
   Repeat coox 49% on milrinone 0.375 mcg/kg/min.   Started on amio for prolonged NSVT.  Amio shortage limits long term use of IV amio.   Will continue current dose for now and recheck coox this afternoon.  Do not want to worsen VT, and patient feeling somewhat better now with 5 L diuresis so far today.    Of note, Pt now DNR and patient and family wish for no further escalation of care. Goal is to stabilize for home with hospice and avoid further hospitalizations.    Legrand Como [address] [place], PA-C 06/01/2016 3:47 PM

## 2016-06-01 NOTE — Progress Notes (Signed)
Paged for NSVT  Telemetry reviewed personally. Between 1009 and 1014 had ~ 130 beats NSVT.      Bolus amio 150 mg and start on drip at 60 mg/hr x 6 hrs.    Check electrolytes and will supp as needed.  Continue to follow closely.    Legrand Como [address] [place], PA-C 06/01/2016 11:25 AM

## 2016-06-01 NOTE — Progress Notes (Signed)
Advanced Heart Failure Rounding Note  Primary Care: Wende Neighbors, MD Primary Cardiologist: Dr Harl Bowie Primary HF: Dr. Haroldine Laws  Reason for Admission: Acute on Chronic systolic CHF  Subjective:    Sent to ED from Clinic and admitted 05/31/16 with marked volume overload and recurrent low output HF.   Started on milrinone 0.25 mcg/kg/min empirically 05/31/16.  Coox this morning 39% on 0.25 mcg/kg/min.  (Initial coox of 72% was prior to PICC line placement. Unclear where this was drawn)  States he is feeling better this morning, but still breathing very hard at rest. States "I just breath hard like that".  Occasionally zones out of conversation. Legs remains swollen. Audible wheezing and yellow productive cough.   Out 3.2 L thus far on Lasix 80 mg IV TID ( got 2 doses yesterday). Creatinine 2.5 -> 2.3. K 3.7 CVP 26  Objective:   Weight Range: 201 lb 9.6 oz (91.4 kg) Body mass index is 28.12 kg/m.   Vital Signs:   Temp:  [97.5 F (36.4 C)-98.2 F (36.8 C)] 97.5 F (36.4 C) (12/28 0402) Pulse Rate:  [61-81] 62 (12/28 0700) Resp:  [15-30] 18 (12/28 0700) BP: (105-132)/(57-100) 121/87 (12/28 0700) SpO2:  [93 %-100 %] 99 % (12/28 0700) Weight:  [201 lb 9.6 oz (91.4 kg)-210 lb (95.3 kg)] 201 lb 9.6 oz (91.4 kg) (12/28 0500)    Weight change: Filed Weights   05/31/16 0841 05/31/16 2114 06/01/16 0500  Weight: 210 lb (95.3 kg) 202 lb 13.2 oz (92 kg) 201 lb 9.6 oz (91.4 kg)    Intake/Output:   Intake/Output Summary (Last 24 hours) at 06/01/16 0717 Last data filed at 06/01/16 [id number]  Gross per 24 hour  Intake            91.59 ml  Output             3325 ml  Net         -3233.41 ml     Physical Exam: General:  Elderly, chronically ill and fatigued appearing.  HEENT: Normal Neck: supple. JVP to jaw with prominent CV wave. Carotids 2+ bilat; no bruits. No thyromegaly or nodule noted. Cor: PMI nondisplaced. RRR. 3/6 MR.  Lungs: Diminished basilar sounds. +Expiratory wheeze,  productive yellow cough.  Abdomen: soft, nontender, + distended. No hepatosplenomegaly. No bruits or masses. Good bowel sounds. Extremities: no cyanosis, clubbing, rash. 1-2+ LLE edema  Neuro: alert & orientedx3, cranial nerves grossly intact. moves all 4 extremities w/o difficulty. Affect pleasant. + asterixis   Telemetry: Reviewed personally, NSR with frequent PVCs including NSVT.   Labs: CBC  Recent Labs  05/31/16 0846  WBC 5.7  HGB 10.8*  HCT 33.7*  MCV 88.9  PLT 98*   Basic Metabolic Panel  Recent Labs  05/31/16 0846 06/01/16 0441  NA 136 138  K 4.4 3.7  CL 100* 103  CO2 27 24  GLUCOSE 101* 152*  BUN 38* 39*  CREATININE 2.52* 2.30*  CALCIUM 8.9 8.6*   Liver Function Tests  Recent Labs  05/31/16 0846  AST 47*  ALT 20  ALKPHOS 121  BILITOT 3.4*  PROT 7.4  ALBUMIN 3.0*   No results for input(s): LIPASE, AMYLASE in the last 72 hours. Cardiac Enzymes No results for input(s): CKTOTAL, CKMB, CKMBINDEX, TROPONINI in the last 72 hours.  BNP: BNP (last 3 results)  Recent Labs  03/23/16 1015 05/11/16 0958 05/31/16 0906  BNP 721.4* 898.0* 1,763.1*    ProBNP (last 3 results) No results for input(s): PROBNP  in the last 8760 hours.   D-Dimer No results for input(s): DDIMER in the last 72 hours. Hemoglobin A1C No results for input(s): HGBA1C in the last 72 hours. Fasting Lipid Panel No results for input(s): CHOL, HDL, LDLCALC, TRIG, CHOLHDL, LDLDIRECT in the last 72 hours. Thyroid Function Tests No results for input(s): TSH, T4TOTAL, T3FREE, THYROIDAB in the last 72 hours.  Invalid input(s): FREET3  Other results:     Imaging/Studies:  Dg Chest 2 View  Result Date: 05/31/2016 CLINICAL DATA:  Cough, congestion for 2 days, former smoking history EXAM: CHEST  2 VIEW COMPARISON:  Chest x-ray of 02/04/2016 FINDINGS: There is opacity at the right lung base which may be due to atelectasis although a right lower lobe pneumonia and possible small  right effusion is a definite consideration. In addition, there is moderate cardiomegaly present with slightly prominent interstitial markings suggesting mild superimposed interstitial edema, with a small amount of fluid in the fissures. AICD leads remain. Median sternotomy sutures are noted. IMPRESSION: 1. Right lower lobe opacity consistent with atelectasis or pneumonia and possible small right effusion. 2. Suspect mild superimposed interstitial edema.  Cardiomegaly. Electronically Signed   By: Ivar Drape M.D.   On: 05/31/2016 09:03   Ir Fluoro Guide Cv Line Right  Result Date: 05/31/2016 INDICATION: 70 year old male with acute right-sided heart failure. He requires durable venous access for milrinone continuous IV infusion. EXAM: PICC LINE PLACEMENT WITH ULTRASOUND AND FLUOROSCOPIC GUIDANCE MEDICATIONS: None ANESTHESIA/SEDATION: None FLUOROSCOPY TIME:  Fluoroscopy Time: 0 minutes 12 seconds (1 mGy). COMPLICATIONS: None immediate. PROCEDURE: The patient was advised of the possible risks and complications and agreed to undergo the procedure. The patient was then brought to the angiographic suite for the procedure. The right/left arm was prepped with chlorhexidine, draped in the usual sterile fashion using maximum barrier technique (cap and mask, sterile gown, sterile gloves, large sterile sheet, hand hygiene and cutaneous antisepsis) and infiltrated locally with 1% Lidocaine. Ultrasound demonstrated patency of the right brachial vein, and this was documented with an image. Under real-time ultrasound guidance, this vein was accessed with a 21 gauge micropuncture needle and image documentation was performed. A 0.018 wire was introduced in to the vein. Over this, a 5 [place] dual lumen power injectable PICC was advanced to the lower SVC/right atrial junction. Fluoroscopy during the procedure and fluoro spot radiograph confirms appropriate catheter position. The catheter was flushed and covered with a sterile  dressing. Catheter length: 37 cm IMPRESSION: Successful right arm power PICC line placement with ultrasound and fluoroscopic guidance. The catheter is ready for use. Electronically Signed   By: Jacqulynn Cadet M.D.   On: 05/31/2016 17:04   Ir US Guide Vasc Access Right  Result Date: 05/31/2016 INDICATION: 70 year old male with acute right-sided heart failure. He requires durable venous access for milrinone continuous IV infusion. EXAM: PICC LINE PLACEMENT WITH ULTRASOUND AND FLUOROSCOPIC GUIDANCE MEDICATIONS: None ANESTHESIA/SEDATION: None FLUOROSCOPY TIME:  Fluoroscopy Time: 0 minutes 12 seconds (1 mGy). COMPLICATIONS: None immediate. PROCEDURE: The patient was advised of the possible risks and complications and agreed to undergo the procedure. The patient was then brought to the angiographic suite for the procedure. The right/left arm was prepped with chlorhexidine, draped in the usual sterile fashion using maximum barrier technique (cap and mask, sterile gown, sterile gloves, large sterile sheet, hand hygiene and cutaneous antisepsis) and infiltrated locally with 1% Lidocaine. Ultrasound demonstrated patency of the right brachial vein, and this was documented with an image. Under real-time ultrasound guidance, this vein  was accessed with a 21 gauge micropuncture needle and image documentation was performed. A 0.018 wire was introduced in to the vein. Over this, a 5 [place] dual lumen power injectable PICC was advanced to the lower SVC/right atrial junction. Fluoroscopy during the procedure and fluoro spot radiograph confirms appropriate catheter position. The catheter was flushed and covered with a sterile dressing. Catheter length: 37 cm IMPRESSION: Successful right arm power PICC line placement with ultrasound and fluoroscopic guidance. The catheter is ready for use. Electronically Signed   By: Jacqulynn Cadet M.D.   On: 05/31/2016 17:04       Medications:     Scheduled Medications: .  amiodarone  200 mg Oral Daily  . atorvastatin  40 mg Oral Daily  . ferrous sulfate  325 mg Oral Q breakfast  . furosemide  80 mg Intravenous Q8H  . magnesium oxide  400 mg Oral Daily  . sodium chloride flush  3 mL Intravenous Q12H  . Warfarin - Pharmacist Dosing Inpatient   Does not apply q1800     Infusions: . milrinone 0.25 mcg/kg/min (06/01/16 0413)     PRN Medications:  sodium chloride, acetaminophen, guaiFENesin-dextromethorphan, ondansetron (ZOFRAN) IV, sodium chloride flush   Assessment/Plan   1. 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  - Remains markedly volume overloaded, but diuresing well thus far on IV lasix + milrinone.  - Will cautiously increase milrinone to 0.375 mcg/kg/min and recheck coox this am. Watch NSVT. - Continue lasix 80 mg q8hrs for now. Supp K.  - No ACE/ARB/Spiro with AKI.  - No BB with low output.  - Will check Ammonia with repeat labs later today with + Asterixis.  2. ARF on CKD stage III - Baseline ~ 1.4 - 1.6.  - Trending down with diuresis. Continue to follow closely.   3. Respiratory failure with hypoxia - Requiring 02 via [place] to keep sats up.   - ? PNA component with wheezing and yellow productive cough. CXR on admission with RLL opacity.  Will start on Levaquin - Will give breathing treatment, can repeat as needed.  4. Hx of CVA - Continue coumadin. Dosing per pharmacy.  5. SVT - Continue amio 200 mg BID.  - Frequent PVCs and occasional NSVT. Check Mg and Supp K.  6. Code Status - Had long discussion that with pts end stage HF CPR/Intubation would likely be futile, and may prolong suffering with no good end result. Pt to consider Code Status.  Consult palliative care. Pt agreed.  Repeat Coox + BMET at 1100 am. Check Mg.  Concerned for even his short term prognosis. Has previously had discussions with Palliative and currently remains a full code.   Will need to revisit. Suspect he is nearing end of life.   Length of Stay: 1  Annamaria Helling  06/01/2016, 7:17 AM  Advanced Heart Failure Team Pager [phone] (M-F; 7a - 4p)  Please contact [facility] for night-coverage after hours (4p -7a ) and weekends on [email or web address]  Patient seen and examined with Oda Kilts, PA-C. We discussed all aspects of the encounter. I agree with the assessment and plan as stated above.   Remains very tenuous. Cardiac output low. BP soft. CVP markedly elevated. Also with productive sputum. He has end-stage biventricular HF with marked volume overload. Will increase milrinone. Continue diuresis. Start abx. Prognosis extremely tenuous. Can add nroepi as needed.   CCT 35 mins.  Maki Sweetser,MD 8:46 AM

## 2016-06-02 DIAGNOSIS — I5021 Acute systolic (congestive) heart failure: Secondary | ICD-10-CM

## 2016-06-02 LAB — COOXEMETRY PANEL
CARBOXYHEMOGLOBIN: 1.2 % (ref 0.5–1.5)
CARBOXYHEMOGLOBIN: 1.3 % (ref 0.5–1.5)
Carboxyhemoglobin: 1 % (ref 0.5–1.5)
Carboxyhemoglobin: 1.8 % — ABNORMAL HIGH (ref 0.5–1.5)
METHEMOGLOBIN: 0.9 % (ref 0.0–1.5)
METHEMOGLOBIN: 1 % (ref 0.0–1.5)
METHEMOGLOBIN: 1.1 % (ref 0.0–1.5)
Methemoglobin: 0.8 % (ref 0.0–1.5)
O2 SAT: 41.1 %
O2 SAT: 39.9 %
O2 Saturation: 39.4 %
O2 Saturation: 51.9 %
TOTAL HEMOGLOBIN: 12.6 g/dL (ref 12.0–16.0)
Total hemoglobin: 10.1 g/dL — ABNORMAL LOW (ref 12.0–16.0)
Total hemoglobin: 9 g/dL — ABNORMAL LOW (ref 12.0–16.0)
Total hemoglobin: 9.8 g/dL — ABNORMAL LOW (ref 12.0–16.0)

## 2016-06-02 LAB — PROTIME-INR
INR: 3.47
Prothrombin Time: 35.8 seconds — ABNORMAL HIGH (ref 11.4–15.2)

## 2016-06-02 LAB — MAGNESIUM: Magnesium: 2.2 mg/dL (ref 1.7–2.4)

## 2016-06-02 LAB — BASIC METABOLIC PANEL
Anion gap: 10 (ref 5–15)
BUN: 31 mg/dL — AB (ref 6–20)
CHLORIDE: 101 mmol/L (ref 101–111)
CO2: 25 mmol/L (ref 22–32)
CREATININE: 2.01 mg/dL — AB (ref 0.61–1.24)
Calcium: 8.5 mg/dL — ABNORMAL LOW (ref 8.9–10.3)
GFR calc Af Amer: 37 mL/min — ABNORMAL LOW (ref >60)
GFR, EST NON AFRICAN AMERICAN: 32 mL/min — AB (ref >60)
GLUCOSE: 187 mg/dL — AB (ref 65–99)
POTASSIUM: 3.8 mmol/L (ref 3.5–5.1)
Sodium: 136 mmol/L (ref 135–145)

## 2016-06-02 MED ORDER — METOLAZONE 2.5 MG PO TABS
2.5000 mg | ORAL_TABLET | Freq: Once | ORAL | Status: AC
  Administered 2016-06-02: 2.5 mg via ORAL
  Filled 2016-06-02: qty 1

## 2016-06-02 MED ORDER — NOREPINEPHRINE BITARTRATE 1 MG/ML IV SOLN
3.0000 ug/min | INTRAVENOUS | Status: DC
Start: 2016-06-02 — End: 2016-06-07
  Administered 2016-06-02 – 2016-06-06 (×5): 3 ug/min via INTRAVENOUS
  Filled 2016-06-02 (×6): qty 4

## 2016-06-02 MED ORDER — MILRINONE LACTATE IN DEXTROSE 20-5 MG/100ML-% IV SOLN
0.5000 ug/kg/min | INTRAVENOUS | Status: DC
  Administered 2016-06-02 – 2016-06-07 (×16): 0.5 ug/kg/min via INTRAVENOUS
  Filled 2016-06-02 (×17): qty 100

## 2016-06-02 MED ORDER — MORPHINE SULFATE (CONCENTRATE) 10 MG/0.5ML PO SOLN: 2.5000 mg | ORAL | Status: DC | PRN

## 2016-06-02 MED ORDER — POTASSIUM CHLORIDE CRYS ER 20 MEQ PO TBCR
40.0000 meq | EXTENDED_RELEASE_TABLET | Freq: Two times a day (BID) | ORAL | Status: DC
  Administered 2016-06-02 (×2): 40 meq via ORAL
  Filled 2016-06-02 (×2): qty 2

## 2016-06-02 NOTE — Progress Notes (Addendum)
Coox remains markedly depressed at 39.9% despite milrinone 0.5 mcg/kg/min.   Will add 3 mcg of norepi and recheck coox this afternoon.   Continue    Legrand Como "Oda Kilts, [place] 06/02/2016 1:33 PM

## 2016-06-02 NOTE — Progress Notes (Signed)
Advanced Heart Failure Rounding Note  Primary Care: Wende Neighbors, MD Primary Cardiologist: Dr Harl Bowie Primary HF: Dr. Haroldine Laws  Reason for Admission: Acute on Chronic systolic CHF  Subjective:    Sent to ED from Clinic and admitted 05/31/16 with marked volume overload and recurrent low output HF.   Started on milrinone 0.25 mcg/kg/min empirically 05/31/16. Started on Levaquin 06/01/16 with productive cough and rhonchi/wheezing lung sounds with + CXR findings.  Coox 41% this am on milrinone 0.375 mcg/kg/min.  Remains on amio gtt for run of VT yesterday. Continues to have frequent PVCs.   Remains SOB with minimal exertion. Takes 20 minutes to recover from sitting up to eat breakfast. Remains orthopneic. Priority is to get home.   Out 2.4L yesterday. Weight shows only down 1 lb. Creatinine 2.5 -> 2.3 -> 2.01. Mg 2.2. K 3.8 CVP 27-28  Objective:   Weight Range: 200 lb 6.4 oz (90.9 kg) Body mass index is 27.95 kg/m.   Vital Signs:   Temp:  [97.3 F (36.3 C)-98.1 F (36.7 C)] 97.8 F (36.6 C) (12/29 0400) Pulse Rate:  [63-88] 65 (12/29 0400) Resp:  [13-31] 21 (12/29 0400) BP: (91-135)/(68-87) 112/76 (12/29 0400) SpO2:  [97 %-100 %] 98 % (12/29 0400) Weight:  [200 lb 6.4 oz (90.9 kg)] 200 lb 6.4 oz (90.9 kg) (12/29 0429)    Weight change: Filed Weights   05/31/16 2114 06/01/16 0500 06/02/16 0429  Weight: 202 lb 13.2 oz (92 kg) 201 lb 9.6 oz (91.4 kg) 200 lb 6.4 oz (90.9 kg)    Intake/Output:   Intake/Output Summary (Last 24 hours) at 06/02/16 0713 Last data filed at 06/02/16 0600  Gross per 24 hour  Intake           1418.7 ml  Output             3875 ml  Net          -2456.3 ml     Physical Exam: CVP 27-28 General:  Chronically ill appearing.   HEENT: Normal Neck: supple. JVP remains elevated to jaw. Carotids 2+ bilat; no bruits. No thyromegaly or nodule noted. Cor: PMI nondisplaced. RRR. 3/6 MR.  Lungs: Decreased basilar sounds. + Rhonchi. Basilar crackles.     Abdomen: soft, NT, + distended. No HSM appreciated. No bruits or masses. +BS Extremities: no cyanosis, clubbing, rash. 1-2+ LLE edema.   Neuro: alert & orientedx3, cranial nerves grossly intact. moves all 4 extremities w/o difficulty. Affect pleasant. + asterixis  Telemetry: Reviewed, NSR. Continues to have frequent PVCs.    Labs: CBC  Recent Labs  05/31/16 0846  WBC 5.7  HGB 10.8*  HCT 33.7*  MCV 88.9  PLT 98*   Basic Metabolic Panel  Recent Labs  06/01/16 1045 06/02/16 0416  NA 136 136  K 3.5 3.8  CL 103 101  CO2 22 25  GLUCOSE 207* 187*  BUN 37* 31*  CREATININE 2.22* 2.01*  CALCIUM 8.5* 8.5*  MG 2.1 2.2   Liver Function Tests  Recent Labs  05/31/16 0846  AST 47*  ALT 20  ALKPHOS 121  BILITOT 3.4*  PROT 7.4  ALBUMIN 3.0*   No results for input(s): LIPASE, AMYLASE in the last 72 hours. Cardiac Enzymes No results for input(s): CKTOTAL, CKMB, CKMBINDEX, TROPONINI in the last 72 hours.  BNP: BNP (last 3 results)  Recent Labs  03/23/16 1015 05/11/16 0958 05/31/16 0906  BNP 721.4* 898.0* 1,763.1*    ProBNP (last 3 results) No results for input(s):  PROBNP in the last 8760 hours.   D-Dimer No results for input(s): DDIMER in the last 72 hours. Hemoglobin A1C No results for input(s): HGBA1C in the last 72 hours. Fasting Lipid Panel No results for input(s): CHOL, HDL, LDLCALC, TRIG, CHOLHDL, LDLDIRECT in the last 72 hours. Thyroid Function Tests No results for input(s): TSH, T4TOTAL, T3FREE, THYROIDAB in the last 72 hours.  Invalid input(s): FREET3  Other results:     Imaging/Studies:  No results found.    Medications:     Scheduled Medications: . atorvastatin  40 mg Oral Daily  . ferrous sulfate  325 mg Oral Q breakfast  . furosemide  80 mg Intravenous Q8H  . levofloxacin (LEVAQUIN) IV  750 mg Intravenous Q48H  . magnesium oxide  400 mg Oral Daily  . potassium chloride  20 mEq Oral BID  . sodium chloride flush  3 mL  Intravenous Q12H  . Warfarin - Pharmacist Dosing Inpatient   Does not apply q1800    Infusions: . amiodarone 30 mg/hr (06/02/16 0028)  . milrinone 0.375 mcg/kg/min (06/02/16 0430)    PRN Medications: sodium chloride, acetaminophen, albuterol, guaiFENesin-dextromethorphan, ondansetron (ZOFRAN) IV, sodium chloride flush   Assessment/Plan   1. 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  - Remains markedly volume overloaded. - Continue IV lasix 80 mg BID. Give 2.5 mg metolazone this am. Increase K supp.   - Coox 41% on milrinone 0.375 mcg/kg/min. Recheck coox now.  - Continue lasix 80 mg q8hrs for now. Supp K.  - No ACE/ARB/Spiro with AKI.  - No BB with low output.  - Ammonia WNL, though seems to have + Asterixis and elevated bilirubin.   2. ARF on CKD stage III - Baseline ~ 1.4 - 1.6.  - Continues to trend down with diuresis. Continue to follow closely.    3. Respiratory failure with hypoxia - Requiring 02 via [place] to keep sats up.   - ? PNA component with wheezing and yellow productive cough. CXR on admission with RLL opacity.   - Continue Levaquin.  - Continue prn nebs.   4. Hx of CVA - Continue coumadin.  - INR 2.6 -> 3.47. Dosing per pharmacy. Elevated due to low output, levaquin, and amiodarone.   5. VT: had run of 130 beats on 12/28.  - Continue IV amio for now.  With shortage, will strongly consider transition to po, but if need to go up on milrinone will need to keep on gtt for time being.   - Still having some PVCs, no further NSVT over night.  - Goal K > 4.0 and Mg > 2.0 6. Code Status - Palliative following. Pt now DNR/DNI.  Wishes to go home with hospice.   Prognosis extremely tenuous. Coox remains markedly depressed despite milrinone 0.375 mcg/kg/min. Repeat coox. May need to go up on milrinone. Will continue to attempt to diurese, but suspect majority of his dyspnea is from End stage  RV failure, which thus far has not responded very well. Pt and family now wish to stabilize for home with hospice.  Suspect that he has a very poor short term prognosis.   Length of Stay: 2  Annamaria Helling  06/02/2016, 7:13 AM  Advanced Heart Failure Team Pager [phone] (M-F; 7a - 4p)  Please contact [facility] for night-coverage after hours (4p -7a ) and weekends on [email or web address]  Patient seen with PA, agree with the above note.  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  He is markedly volume overloaded and symptomatic with any exertion.  Co-ox is low on milrinone 0.375. He diuresed reasonably yesterday.  - Increase milrinone to 0.5 and repeat co-ox. Could consider norepinephrine in addition but given plan to move towards comfort care/hospice would probably hold off.  - Continue IV Lasix today and will give a dose of metolazone 2.5 x 1.  - He is now DNR.  Goal at this point is to try to diurese him more and get him stable enough to go home with hospice care.   Run of VT yesterday, continue IV amiodarone especially with increase in milrinone.   Creatinine stable to lower.   Coumadin held with elevated INR.   35 minutes critical care time.   Loralie Champagne 06/02/2016 9:39 AM

## 2016-06-02 NOTE — Care Management Note (Signed)
Case Management Note  Patient Details  Name: KYMIR COLES MRN: [id number] Date of Birth: 06/14/1945  Subjective/Objective:                    Action/Plan: CM consulted for hospice at home. CM spoke to patients wife over the phone to go over home hospice agencies in [place]. Wife was in waiting area so CM met her and provided her a list of the agencies. She wants to talk with her family before making a decision. CM will continue to follow.  Expected Discharge Date:                  Expected Discharge Plan:  Home w Hospice Care  In-House Referral:     Discharge planning Services  CM Consult  Post Acute Care Choice:    Choice offered to:     DME Arranged:    DME Agency:     HH Arranged:    [facility] Agency:     Status of Service:  In process, will continue to follow  If discussed at Long Length of Stay Meetings, dates discussed:    Additional Comments:  Pollie Friar, RN 06/02/2016, 4:20 PM

## 2016-06-02 NOTE — Progress Notes (Signed)
ANTICOAGULATION CONSULT NOTE - Follow Up Consult  Pharmacy Consult for Coumadin Indication:hx PE and CVA  No Known Allergies  Patient Measurements: Height: 5\' 11"  (180.3 cm) Weight: 200 lb 6.4 oz (90.9 kg) IBW/kg (Calculated) : 75.3   Vital Signs: Temp: 97.4 F (36.3 C) (12/29 0744) Temp Source: Oral (12/29 0744) BP: 118/80 (12/29 0744) Pulse Rate: 69 (12/29 0744)  Labs:  Recent Labs  05/31/16 0846 05/31/16 1214 06/01/16 0441 06/01/16 1045 06/02/16 0416  HGB 10.8*  --   --   --   --   HCT 33.7*  --   --   --   --   PLT 98*  --   --   --   --   LABPROT  --  27.1* 28.4*  --  35.8*  INR  --  2.45 2.60  --  3.47  CREATININE 2.52*  --  2.30* 2.22* 2.01*    Estimated Creatinine Clearance: 39.4 mL/min (by C-G formula based on SCr of 2.01 mg/dL (H)).  Assessment: 70yom continues on coumadin for hx PE and CVA. INR jumped and is now supratherapeutic at 3.47 - likely due to drug interactions with levaquin and amiodarone.  PTA dose: 3mg  daily except 4.5mg  on Monday/Thursday  Goal of Therapy:  INR 2-3 Monitor platelets by anticoagulation protocol: Yes   Plan:  1) Hold coumadin tonight 2) Daily INR  Deboraha Sprang 06/02/2016,10:38 AM

## 2016-06-02 NOTE — Progress Notes (Signed)
Daily Progress Note   Patient Name: Rick [name]       Date: 06/02/2016 DOB: Mar 01, 1946  Age: 70 y.o. MRN#: [id number] Attending Physician: Jolaine Artist, MD Primary Care Physician: Wende Neighbors, MD Admit Date: 05/31/2016  Reason for Consultation/Follow-up: Establishing goals of care and Non pain symptom management  Subjective: Pt seen in bed sitting up for breakfast. He continues to be SOB. Encouraged him to request morphine if feeling SOB and as premed before activity. He had no questions or concerns. His main goal is to stabilize and be able to return home.  Review of Systems  Respiratory: Positive for shortness of breath and wheezing.   Cardiovascular: Positive for leg swelling.  All other systems reviewed and are negative.   Length of Stay: 2  Current Medications: Scheduled Meds:  . atorvastatin  40 mg Oral Daily  . ferrous sulfate  325 mg Oral Q breakfast  . furosemide  80 mg Intravenous Q8H  . levofloxacin (LEVAQUIN) IV  750 mg Intravenous Q48H  . magnesium oxide  400 mg Oral Daily  . potassium chloride  40 mEq Oral BID  . sodium chloride flush  3 mL Intravenous Q12H  . Warfarin - Pharmacist Dosing Inpatient   Does not apply q1800    Continuous Infusions: . amiodarone 30 mg/hr (06/02/16 0028)  . milrinone      PRN Meds: sodium chloride, acetaminophen, albuterol, guaiFENesin-dextromethorphan, morphine CONCENTRATE, ondansetron (ZOFRAN) IV, sodium chloride flush  Physical Exam  Constitutional: He appears well-developed and well-nourished. No distress.  Cardiovascular: Normal rate.   Pulmonary/Chest: He has wheezes. He has rales.  Labored at rest  Musculoskeletal: He exhibits edema.            Vital Signs: BP 118/80 (BP Location: Left Arm)   Pulse 69   Temp  97.4 F (36.3 C) (Oral)   Resp (!) 22   Ht 5\' 11"  (1.803 m)   Wt 90.9 kg (200 lb 6.4 oz)   SpO2 100%   BMI 27.95 kg/m  SpO2: SpO2: 100 % O2 Device: O2 Device: Nasal Cannula O2 Flow Rate: O2 Flow Rate (L/min): 2 L/min  Intake/output summary:  Intake/Output Summary (Last 24 hours) at 06/02/16 1027 Last data filed at 06/02/16 0900  Gross per 24 hour  Intake  1172.4 ml  Output             3100 ml  Net          -1927.6 ml   LBM:   Baseline Weight: Weight: 95.3 kg (210 lb) Most recent weight: Weight: 90.9 kg (200 lb 6.4 oz)       Palliative Assessment/Data: PPS: 40%    Flowsheet Rows   Flowsheet Row Most Recent Value  Intake Tab  Referral Department  Cardiology  Unit at Time of Referral  Intermediate Care Unit  Palliative Care Primary Diagnosis  Cardiac  Palliative Care Type  Return patient Palliative Care  Reason for referral  Clarify Goals of Care, Advance Care Planning  Date of Admission  05/31/16  Date first seen by Palliative Care  06/01/16  Clinical Assessment  Psychosocial & Spiritual Assessment  Palliative Care Outcomes  Patient/Family meeting held?  Yes  Who was at the meeting?  Patient, spouse, daughter  Palliative Care Outcomes  Improved non-pain symptom therapy, Clarified goals of care, Provided advance care planning, Changed CPR status, Transitioned to hospice  Patient/Family wishes: Interventions discontinued/not started   Mechanical Ventilation, Tube feedings/TPN  Palliative Care follow-up planned  Yes, Home  Palliative Care Follow-up Reason  Advanced care planning, Non-pain symptom      Patient Active Problem List   Diagnosis Date Noted  . SOB (shortness of breath)   . Goals of care, counseling/discussion   . Advance care planning   . CHF (congestive heart failure) ([place]) 05/31/2016  . CHF NYHA class IV, acute, systolic ([place]) [id number]  . PSVT (paroxysmal supraventricular tachycardia) ([place])   . CKD (chronic kidney disease) stage 3, GFR  30-59 ml/min   . Palliative care encounter   . Chronic combined systolic and diastolic CHF (congestive heart failure) ([place])   . Coronary artery disease involving coronary bypass graft of native heart without angina pectoris   . Subdural hematoma ([place]) 11/13/2015  . CAD (coronary artery disease) of artery bypass graft 10/21/2015  . CAD (coronary artery disease) 10/21/2015  . Anasarca 10/07/2015  . Normocytic anemia 10/07/2015  . Leg pain 11/13/2014  . Encounter for therapeutic drug monitoring 08/05/2013  . Chronic passive hepatic congestion 07/09/2012  . Chest pain 07/01/2012  . Jaundice 05/15/2012  . Syncope 04/12/2012  . Abnormal liver function tests 04/12/2012  . Chronic systolic heart failure ([place]) 04/12/2012  . Edema 04/12/2012  . Sleep apnea 04/12/2012  . Automatic implantable cardioverter-defibrillator in situ 03/12/2012  . Encounter for long-term (current) use of anticoagulants 12/18/2011  . Essential hypertension   . H/O atherosclerotic cardiovascular disease   . Ischemic cardiomyopathy   . Other pulmonary embolism and infarction   . Hyperlipidemia   . DOE (dyspnea on exertion)     Palliative Care Assessment & Plan   Patient Profile:  70 y.o. male  with past medical history of ischemic cardiomyopathy (s/p CABG 2003), CHF (EF 25% in 2016),   admitted on 05/31/2016 with CHF exacerbation with volume overload and recurrent low output. Therapies are maximized and he remains SOB at rest. Palliative medicine consulted for [place].  Assessment/Recommendations/Plan   Offer morphine SL for SOB  Home with hospice when stable  Goals of Care and Additional Recommendations:  Limitations on Scope of Treatment: Avoid Hospitalization, No Surgical Procedures and No Tracheostomy  Code Status:  DNR  Prognosis:   < 6 months d/t end stage CHF, symptomatic at rest, therapies maximized  Discharge Planning:  Home with Hospice  Care plan was discussed  with patient.   Thank you  for allowing the Palliative Medicine Team to assist in the care of this patient.   Time In: 0945 Time Out: 1015 Total Time 30 mins Prolonged Time Billed NO      Greater than 50%  of this time was spent counseling and coordinating care related to the above assessment and plan.  Mariana Kaufman, AGNP-C Palliative Medicine   Please contact Palliative Medicine Team phone at [phone] for questions and concerns.

## 2016-06-03 LAB — BASIC METABOLIC PANEL
Anion gap: 7 (ref 5–15)
BUN: 26 mg/dL — AB (ref 6–20)
CALCIUM: 8.5 mg/dL — AB (ref 8.9–10.3)
CO2: 27 mmol/L (ref 22–32)
CREATININE: 1.67 mg/dL — AB (ref 0.61–1.24)
Chloride: 97 mmol/L — ABNORMAL LOW (ref 101–111)
GFR calc Af Amer: 46 mL/min — ABNORMAL LOW (ref >60)
GFR, EST NON AFRICAN AMERICAN: 40 mL/min — AB (ref >60)
GLUCOSE: 333 mg/dL — AB (ref 65–99)
Potassium: 3.5 mmol/L (ref 3.5–5.1)
SODIUM: 131 mmol/L — AB (ref 135–145)

## 2016-06-03 LAB — PROTIME-INR
INR: 3.59
Prothrombin Time: 36.7 seconds — ABNORMAL HIGH (ref 11.4–15.2)

## 2016-06-03 LAB — COOXEMETRY PANEL
CARBOXYHEMOGLOBIN: 2.1 % — AB (ref 0.5–1.5)
METHEMOGLOBIN: 0.7 % (ref 0.0–1.5)
O2 SAT: 57.3 %
TOTAL HEMOGLOBIN: 10.7 g/dL — AB (ref 12.0–16.0)

## 2016-06-03 MED ORDER — POTASSIUM CHLORIDE CRYS ER 20 MEQ PO TBCR
40.0000 meq | EXTENDED_RELEASE_TABLET | Freq: Two times a day (BID) | ORAL | Status: DC
  Administered 2016-06-03 – 2016-06-08 (×12): 40 meq via ORAL
  Filled 2016-06-03 (×12): qty 2

## 2016-06-03 MED ORDER — METOLAZONE 2.5 MG PO TABS
2.5000 mg | ORAL_TABLET | Freq: Once | ORAL | Status: AC
  Administered 2016-06-03: 2.5 mg via ORAL
  Filled 2016-06-03: qty 1

## 2016-06-03 MED ORDER — POTASSIUM CHLORIDE CRYS ER 20 MEQ PO TBCR
40.0000 meq | EXTENDED_RELEASE_TABLET | Freq: Once | ORAL | Status: AC
  Administered 2016-06-03: 40 meq via ORAL
  Filled 2016-06-03: qty 2

## 2016-06-03 NOTE — Progress Notes (Signed)
Notified dayshift RN of lab values - INR 3.59 and Glucose 333.  Pt on Coumadin daily and is not on CBGs.  Requested follow-up w/attending when rounding today.  Pt asymptomatic.

## 2016-06-03 NOTE — Progress Notes (Signed)
ANTICOAGULATION CONSULT NOTE - Follow Up Consult  Pharmacy Consult for Coumadin Indication: hx PE and CVA  No Known Allergies  Patient Measurements: Height: 5\' 11"  (180.3 cm) Weight: 192 lb 3.2 oz (87.2 kg) IBW/kg (Calculated) : 75.3   Vital Signs: Temp: 98.1 F (36.7 C) (12/30 0731) Temp Source: Oral (12/30 0731) BP: 145/90 (12/30 0731) Pulse Rate: 73 (12/30 0731)  Labs:  Recent Labs  06/01/16 0441 06/01/16 1045 06/02/16 0416 06/03/16 0359  LABPROT 28.4*  --  35.8* 36.7*  INR 2.60  --  3.47 3.59  CREATININE 2.30* 2.22* 2.01* 1.67*    Estimated Creatinine Clearance: 43.8 mL/min (by C-G formula based on SCr of 1.67 mg/dL (H)).  Assessment: 70yom continues on coumadin for hx PE and CVA. INR remains supra therapeutic at 3.59 - likely due to drug interactions with levaquin and amiodarone.  PTA dose: 3mg  daily except 4.5mg  on Monday/Thursday  Goal of Therapy:  INR 2-3 Monitor platelets by anticoagulation protocol: Yes   Plan:  1) Hold coumadin tonight 2) Daily INR  Angela Burke, PharmD, BCPS Pharmacy Resident Pager: [phone] 06/03/2016,9:00 AM

## 2016-06-03 NOTE — Progress Notes (Signed)
Patient ID: Rick [name], male   DOB: 03/30/1946, 70 y.o.   MRN: [id number]     Advanced Heart Failure Rounding Note  Primary Care: Wende Neighbors, MD Primary Cardiologist: Dr Harl Bowie Primary HF: Dr. Haroldine Laws  Reason for Admission: Acute on Chronic systolic CHF  Subjective:    Sent to ED from Clinic and admitted 05/31/16 with marked volume overload and recurrent low output HF.   Started on milrinone 0.25 mcg/kg/min empirically 05/31/16. Started on Levaquin 06/01/16 with productive cough and rhonchi/wheezing lung sounds with + CXR findings.  Remains on amio gtt for run of VT. Continues to have frequent PVCs.    Milrinone was titrated to 0.5 and norepinephrine 3 added for persistently low co-ox.  This morning, co-ox up to 57%.  CVP remains > 20.  With addition of metolazone yesterday, he diuresed quite well.  Weight is down 8 lbs.  Still coughing but thinks he is breathing a bit better. Creatinine down to 1.67.   Remains SOB with minimal exertion. Remains orthopneic. Priority is to get home.   Objective:   Weight Range: 192 lb 3.2 oz (87.2 kg) Body mass index is 26.81 kg/m.   Vital Signs:   Temp:  [97.3 F (36.3 C)-98.1 F (36.7 C)] 97.6 F (36.4 C) (12/30 0300) Pulse Rate:  [65-76] 67 (12/30 0300) Resp:  [17-22] 21 (12/30 0300) BP: (117-121)/(66-93) 118/66 (12/30 0300) SpO2:  [96 %-100 %] 99 % (12/30 0300) Weight:  [192 lb 3.2 oz (87.2 kg)] 192 lb 3.2 oz (87.2 kg) (12/30 0500) Last BM Date: 06/02/16  Weight change: Filed Weights   06/01/16 0500 06/02/16 0429 06/03/16 0500  Weight: 201 lb 9.6 oz (91.4 kg) 200 lb 6.4 oz (90.9 kg) 192 lb 3.2 oz (87.2 kg)    Intake/Output:   Intake/Output Summary (Last 24 hours) at 06/03/16 0730 Last data filed at 06/03/16 0700  Gross per 24 hour  Intake          1179.34 ml  Output             5825 ml  Net         -4645.66 ml     Physical Exam: CVP > 20 General:  Chronically ill appearing.   HEENT: Normal Neck: supple. JVP remains  elevated to jaw when sitting up. Carotids 2+ bilat; no bruits. No thyromegaly or nodule noted. Cor: PMI lateral. RRR. 3/6 MR. +S3.  Lungs: Decreased basilar sounds. + Rhonchi. Basilar crackles.   Abdomen: soft, NT, + distended. No HSM appreciated. No bruits or masses. +BS Extremities: no cyanosis, clubbing, rash. 1+ edema to knees bilaterally.   Neuro: alert & orientedx3, cranial nerves grossly intact. moves all 4 extremities w/o difficulty. Affect pleasant.  Telemetry: Reviewed, NSR.     Labs: CBC  Recent Labs  05/31/16 0846  WBC 5.7  HGB 10.8*  HCT 33.7*  MCV 88.9  PLT 98*   Basic Metabolic Panel  Recent Labs  06/01/16 1045 06/02/16 0416 06/03/16 0359  NA 136 136 131*  K 3.5 3.8 3.5  CL 103 101 97*  CO2 22 25 27   GLUCOSE 207* 187* 333*  BUN 37* 31* 26*  CREATININE 2.22* 2.01* 1.67*  CALCIUM 8.5* 8.5* 8.5*  MG 2.1 2.2  --    Liver Function Tests  Recent Labs  05/31/16 0846  AST 47*  ALT 20  ALKPHOS 121  BILITOT 3.4*  PROT 7.4  ALBUMIN 3.0*   No results for input(s): LIPASE, AMYLASE in the last 72 hours.  Cardiac Enzymes No results for input(s): CKTOTAL, CKMB, CKMBINDEX, TROPONINI in the last 72 hours.  BNP: BNP (last 3 results)  Recent Labs  03/23/16 1015 05/11/16 0958 05/31/16 0906  BNP 721.4* 898.0* 1,763.1*    ProBNP (last 3 results) No results for input(s): PROBNP in the last 8760 hours.   D-Dimer No results for input(s): DDIMER in the last 72 hours. Hemoglobin A1C No results for input(s): HGBA1C in the last 72 hours. Fasting Lipid Panel No results for input(s): CHOL, HDL, LDLCALC, TRIG, CHOLHDL, LDLDIRECT in the last 72 hours. Thyroid Function Tests No results for input(s): TSH, T4TOTAL, T3FREE, THYROIDAB in the last 72 hours.  Invalid input(s): FREET3  Other results:     Imaging/Studies:  No results found.    Medications:     Scheduled Medications: . atorvastatin  40 mg Oral Daily  . ferrous sulfate  325 mg Oral Q  breakfast  . furosemide  80 mg Intravenous Q8H  . levofloxacin (LEVAQUIN) IV  750 mg Intravenous Q48H  . magnesium oxide  400 mg Oral Daily  . metolazone  2.5 mg Oral Once  . potassium chloride  40 mEq Oral BID  . potassium chloride  40 mEq Oral Once  . sodium chloride flush  3 mL Intravenous Q12H  . Warfarin - Pharmacist Dosing Inpatient   Does not apply q1800    Infusions: . amiodarone 30 mg/hr (06/02/16 2000)  . milrinone 0.5 mcg/kg/min (06/02/16 2000)  . norepinephrine (LEVOPHED) Adult infusion 3 mcg/min (06/02/16 2000)    PRN Medications: sodium chloride, acetaminophen, albuterol, guaiFENesin-dextromethorphan, morphine CONCENTRATE, ondansetron (ZOFRAN) IV, sodium chloride flush   Assessment/Plan   1. 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  He is markedly volume overloaded and symptomatic with any exertion. He is requiring both milrinone 0.5 and norepinephrine 3 to maintain acceptable co-ox (57% this morning).  CVP remains > 20 though he did diurese quite well yesterday.  - Continue IV lasix 80 mg every 8 hrs. Give 2.5 mg metolazone this am. Increase K supp.   - Continue current milrinone + norepinephrine as we diurese him.   - No ACE/ARB/Spiro with AKI.  - No BB with low output.  - Poor prognosis.  No good options for advanced therapies with age and end stage RV failure.  Palliative care involved, goal is to keep comfortable and get back home, likely with hospice.  He is DNR.  He will need further diuresis to get him home.  2. AKI on CKD stage III: Creatinine better today with inotropic support.  3. Respiratory failure with hypoxia: Requiring 02 via [place] to keep sats up.  ? PNA component with wheezing and yellow productive cough. CXR on admission with RLL opacity.   - Continue Levaquin.  - Continue prn nebs.   4. Hx of CVA - Continue coumadin. Holding  with elevated INR.  5. VT: had run of 130 beats on 12/28.  - Continue IV amio for now with VT as well as high dose milrinone and norepinephrine.  - Goal K > 4.0 and Mg > 2.0 6. Code Status: Palliative following. Pt now DNR/DNI.  Wishes to go home with hospice.   Suspect that he has a very poor short term prognosis.   35 minutes critical care time.   Loralie Champagne 06/03/2016 7:30 AM

## 2016-06-04 DIAGNOSIS — I509 Heart failure, unspecified: Secondary | ICD-10-CM

## 2016-06-04 LAB — BASIC METABOLIC PANEL
ANION GAP: 9 (ref 5–15)
BUN: 24 mg/dL — AB (ref 6–20)
CHLORIDE: 95 mmol/L — AB (ref 101–111)
CO2: 31 mmol/L (ref 22–32)
Calcium: 8.9 mg/dL (ref 8.9–10.3)
Creatinine, Ser: 1.75 mg/dL — ABNORMAL HIGH (ref 0.61–1.24)
GFR calc Af Amer: 44 mL/min — ABNORMAL LOW (ref >60)
GFR, EST NON AFRICAN AMERICAN: 38 mL/min — AB (ref >60)
GLUCOSE: 134 mg/dL — AB (ref 65–99)
POTASSIUM: 3.8 mmol/L (ref 3.5–5.1)
Sodium: 135 mmol/L (ref 135–145)

## 2016-06-04 LAB — CBC
HEMATOCRIT: 32.8 % — AB (ref 39.0–52.0)
HEMOGLOBIN: 10.3 g/dL — AB (ref 13.0–17.0)
MCH: 28.2 pg (ref 26.0–34.0)
MCHC: 31.4 g/dL (ref 30.0–36.0)
MCV: 89.9 fL (ref 78.0–100.0)
Platelets: 86 10*3/uL — ABNORMAL LOW (ref 150–400)
RBC: 3.65 MIL/uL — ABNORMAL LOW (ref 4.22–5.81)
RDW: 17.5 % — AB (ref 11.5–15.5)
WBC: 6.8 10*3/uL (ref 4.0–10.5)

## 2016-06-04 LAB — COOXEMETRY PANEL
Carboxyhemoglobin: 2 % — ABNORMAL HIGH (ref 0.5–1.5)
METHEMOGLOBIN: 0.7 % (ref 0.0–1.5)
O2 Saturation: 55.1 %
TOTAL HEMOGLOBIN: 10.6 g/dL — AB (ref 12.0–16.0)

## 2016-06-04 LAB — PROTIME-INR
INR: 2.73
PROTHROMBIN TIME: 29.5 s — AB (ref 11.4–15.2)

## 2016-06-04 MED ORDER — WARFARIN SODIUM 2.5 MG PO TABS
2.5000 mg | ORAL_TABLET | Freq: Once | ORAL | Status: AC
  Administered 2016-06-04: 2.5 mg via ORAL
  Filled 2016-06-04: qty 1

## 2016-06-04 MED ORDER — POTASSIUM CHLORIDE CRYS ER 20 MEQ PO TBCR
40.0000 meq | EXTENDED_RELEASE_TABLET | Freq: Once | ORAL | Status: AC
  Administered 2016-06-04: 40 meq via ORAL
  Filled 2016-06-04: qty 2

## 2016-06-04 MED ORDER — METOLAZONE 2.5 MG PO TABS
2.5000 mg | ORAL_TABLET | Freq: Once | ORAL | Status: AC
  Administered 2016-06-04: 2.5 mg via ORAL
  Filled 2016-06-04: qty 1

## 2016-06-04 NOTE — Progress Notes (Signed)
Patient ID: Rick [name], male   DOB: 03-Mar-1946, 70 y.o.   MRN: [id number] \    Advanced Heart Failure Rounding Note  Primary Care: Wende Neighbors, MD Primary Cardiologist: Dr Harl Bowie Primary HF: Dr. Haroldine Laws  Reason for Admission: Acute on Chronic systolic CHF  Subjective:    Sent to ED from Clinic and admitted 05/31/16 with marked volume overload and recurrent low output HF.   Started on milrinone 0.25 mcg/kg/min empirically 05/31/16. Started on Levaquin 06/01/16 with productive cough and rhonchi/wheezing lung sounds with + CXR findings.  Remains on amio gtt for run of VT. Continues to have frequent PVCs.    Milrinone was titrated to 0.5 and norepinephrine 3 added for persistently low co-ox.  This morning, co-ox better at 55%.  CVP remains 22.  He has diuresed very well the last two days.  Weight down again.  Cough better.  Creatinine mildly elevated at 1.75.   Remains SOB with minimal exertion. Remains orthopneic. Priority is to get home.   Objective:   Weight Range: 184 lb 8 oz (83.7 kg) Body mass index is 25.73 kg/m.   Vital Signs:   Temp:  [97.3 F (36.3 C)-98.2 F (36.8 C)] 97.3 F (36.3 C) (12/31 0300) Pulse Rate:  [61-80] 61 (12/31 0400) Resp:  [13-21] 14 (12/31 0400) BP: (87-121)/(56-74) 108/69 (12/31 0400) SpO2:  [94 %-100 %] 97 % (12/31 0400) Weight:  [184 lb 8 oz (83.7 kg)] 184 lb 8 oz (83.7 kg) (12/31 0500) Last BM Date: 06/02/16  Weight change: Filed Weights   06/02/16 0429 06/03/16 0500 06/04/16 0500  Weight: 200 lb 6.4 oz (90.9 kg) 192 lb 3.2 oz (87.2 kg) 184 lb 8 oz (83.7 kg)    Intake/Output:   Intake/Output Summary (Last 24 hours) at 06/04/16 0734 Last data filed at 06/04/16 0600  Gross per 24 hour  Intake           1259.1 ml  Output             5700 ml  Net          -4440.9 ml     Physical Exam: CVP 22 General:  Chronically ill appearing.   HEENT: Normal Neck: supple. JVP remains elevated to jaw when sitting up. Carotids 2+ bilat; no  bruits. No thyromegaly or nodule noted. Cor: PMI lateral. RRR. 3/6 MR. +S3.  Lungs: Basilar crackles.   Abdomen: soft, NT, + distended. No HSM appreciated. No bruits or masses. +BS Extremities: no cyanosis, clubbing, rash. 1+ edema to knees bilaterally.   Neuro: alert & orientedx3, cranial nerves grossly intact. moves all 4 extremities w/o difficulty. Affect pleasant.  Telemetry: Reviewed, NSR.     Labs: CBC  Recent Labs  06/04/16 0440  WBC 6.8  HGB 10.3*  HCT 32.8*  MCV 89.9  PLT 86*   Basic Metabolic Panel  Recent Labs  06/01/16 1045 06/02/16 0416 06/03/16 0359 06/04/16 0440  NA 136 136 131* 135  K 3.5 3.8 3.5 3.8  CL 103 101 97* 95*  CO2 22 25 27 31   GLUCOSE 207* 187* 333* 134*  BUN 37* 31* 26* 24*  CREATININE 2.22* 2.01* 1.67* 1.75*  CALCIUM 8.5* 8.5* 8.5* 8.9  MG 2.1 2.2  --   --    Liver Function Tests No results for input(s): AST, ALT, ALKPHOS, BILITOT, PROT, ALBUMIN in the last 72 hours. No results for input(s): LIPASE, AMYLASE in the last 72 hours. Cardiac Enzymes No results for input(s): CKTOTAL, CKMB, CKMBINDEX, TROPONINI in  the last 72 hours.  BNP: BNP (last 3 results)  Recent Labs  03/23/16 1015 05/11/16 0958 05/31/16 0906  BNP 721.4* 898.0* 1,763.1*    ProBNP (last 3 results) No results for input(s): PROBNP in the last 8760 hours.   D-Dimer No results for input(s): DDIMER in the last 72 hours. Hemoglobin A1C No results for input(s): HGBA1C in the last 72 hours. Fasting Lipid Panel No results for input(s): CHOL, HDL, LDLCALC, TRIG, CHOLHDL, LDLDIRECT in the last 72 hours. Thyroid Function Tests No results for input(s): TSH, T4TOTAL, T3FREE, THYROIDAB in the last 72 hours.  Invalid input(s): FREET3  Other results:     Imaging/Studies:  No results found.    Medications:     Scheduled Medications: . atorvastatin  40 mg Oral Daily  . ferrous sulfate  325 mg Oral Q breakfast  . furosemide  80 mg Intravenous Q8H  .  levofloxacin (LEVAQUIN) IV  750 mg Intravenous Q48H  . magnesium oxide  400 mg Oral Daily  . metolazone  2.5 mg Oral Once  . potassium chloride  40 mEq Oral BID  . potassium chloride  40 mEq Oral Once  . sodium chloride flush  3 mL Intravenous Q12H  . Warfarin - Pharmacist Dosing Inpatient   Does not apply q1800    Infusions: . amiodarone 30 mg/hr (06/04/16 0600)  . milrinone 0.5 mcg/kg/min (06/04/16 0600)  . norepinephrine (LEVOPHED) Adult infusion 3 mcg/min (06/04/16 0600)    PRN Medications: sodium chloride, acetaminophen, albuterol, guaiFENesin-dextromethorphan, morphine CONCENTRATE, ondansetron (ZOFRAN) IV, sodium chloride flush   Assessment/Plan   1. 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  He is markedly volume overloaded and symptomatic with any exertion. He is requiring both milrinone 0.5 and norepinephrine 3 to maintain acceptable co-ox (55% this morning).  CVP remains 22 though he did diurese quite well yesterday.  - Continue IV lasix 80 mg every 8 hrs. Give 2.5 mg metolazone this am. Continue K supp.   - Continue current milrinone + norepinephrine as we diurese him.   - No ACE/ARB/Spiro with AKI.  - No BB with low output.  - Poor prognosis.  No good options for advanced therapies with age and end stage RV failure.  Palliative care involved, goal is to keep comfortable and get back home, likely with hospice.  He is DNR.  He will need further diuresis to get him home.  2. AKI on CKD stage III: Creatinine stable with inotropic support.  3. Respiratory failure with hypoxia: Requiring 02 via [place] to keep sats up.  ? PNA component with wheezing and yellow productive cough. CXR on admission with RLL opacity.   - Continue Levaquin.  - Continue prn nebs.   4. Hx of CVA - Continue coumadin. INR therapeutic today.  5. VT: had run of 130 beats on 12/28.  -  Continue IV amio for now with VT as well as high dose milrinone and norepinephrine.  - Goal K > 4.0 and Mg > 2.0 6. Code Status: Palliative following. Pt now DNR/DNI.  Wishes to go home with hospice.   Suspect that he has a very poor short term prognosis. Will have him try to walk some today.   Loralie Champagne 06/04/2016 7:34 AM

## 2016-06-04 NOTE — Progress Notes (Signed)
ANTICOAGULATION CONSULT NOTE - Follow Up Consult  Pharmacy Consult for Coumadin Indication: hx PE and CVA  No Known Allergies  Patient Measurements: Height: 5\' 11"  (180.3 cm) Weight: 184 lb 8 oz (83.7 kg) IBW/kg (Calculated) : 75.3   Vital Signs: Temp: 97.6 F (36.4 C) (12/31 0756) Temp Source: Oral (12/31 0756) BP: 112/72 (12/31 0800) Pulse Rate: 76 (12/31 0800)  Labs:  Recent Labs  06/02/16 0416 06/03/16 0359 06/04/16 0440  HGB  --   --  10.3*  HCT  --   --  32.8*  PLT  --   --  86*  LABPROT 35.8* 36.7* 29.5*  INR 3.47 3.59 2.73  CREATININE 2.01* 1.67* 1.75*    Estimated Creatinine Clearance: 41.8 mL/min (by C-G formula based on SCr of 1.75 mg/dL (H)).  Assessment: 70yom continues on coumadin for hx PE and CVA. INR has been supra-therapeutic this admission likely due to drug interactions with levaquin and amiodarone. Today INR is back in range at 2.73. CBC stable.   PTA dose: 3mg  daily except 4.5mg  on Monday/Thursday  Goal of Therapy:  INR 2-3 Monitor platelets by anticoagulation protocol: Yes   Plan:  1) Warfarin po 2.5mg  x 1 tonight 2) Daily INR; monitor CBC and for S&S of bleed 3) Monitor INR trend closely with amio/levofloxacin/warfarin drug inteaction  Angela Burke, PharmD, BCPS Pharmacy Resident Pager: [phone] 06/04/2016,8:52 AM

## 2016-06-04 NOTE — Discharge Instructions (Signed)
Information on my medicine - Coumadin   (Warfarin)  This medication education was reviewed with me or my healthcare representative as part of my discharge preparation.  The pharmacist that spoke with me during my hospital stay was:  Myrene Galas, [facility]  Why was Coumadin prescribed for you? Coumadin was prescribed for you because you have a blood clot or a medical condition that can cause an increased risk of forming blood clots. Blood clots can cause serious health problems by blocking the flow of blood to the heart, lung, or brain. Coumadin can prevent harmful blood clots from forming. As a reminder your indication for Coumadin is:   Clots  What test will check on my response to Coumadin? While on Coumadin (warfarin) you will need to have an INR test regularly to ensure that your dose is keeping you in the desired range. The INR (international normalized ratio) number is calculated from the result of the laboratory test called prothrombin time (PT).  If an INR APPOINTMENT HAS NOT ALREADY BEEN MADE FOR YOU please schedule an appointment to have this lab work done by your health care provider within 7 days. Your INR goal is usually a number between:  2 to 3 or your provider may give you a more narrow range like 2-2.5.  Ask your health care provider during an office visit what your goal INR is.  What  do you need to  know  About  COUMADIN? Take Coumadin (warfarin) exactly as prescribed by your healthcare provider about the same time each day.  DO NOT stop taking without talking to the doctor who prescribed the medication.  Stopping without other blood clot prevention medication to take the place of Coumadin may increase your risk of developing a new clot or stroke.  Get refills before you run out.  What do you do if you miss a dose? If you miss a dose, take it as soon as you remember on the same day then continue your regularly scheduled regimen the next day.  Do not take two doses of Coumadin at  the same time.  Important Safety Information A possible side effect of Coumadin (Warfarin) is an increased risk of bleeding. You should call your healthcare provider right away if you experience any of the following: ? Bleeding from an injury or your nose that does not stop. ? Unusual colored urine (red or dark brown) or unusual colored stools (red or black). ? Unusual bruising for unknown reasons. ? A serious fall or if you hit your head (even if there is no bleeding).  Some foods or medicines interact with Coumadin (warfarin) and might alter your response to warfarin. To help avoid this: ? Eat a balanced diet, maintaining a consistent amount of Vitamin K. ? Notify your provider about major diet changes you plan to make. ? Avoid alcohol or limit your intake to 1 drink for women and 2 drinks for men per day. (1 drink is 5 oz. wine, 12 oz. beer, or 1.5 oz. liquor.)  Make sure that ANY health care provider who prescribes medication for you knows that you are taking Coumadin (warfarin).  Also make sure the healthcare provider who is monitoring your Coumadin knows when you have started a new medication including herbals and non-prescription products.  Coumadin (Warfarin)  Major Drug Interactions  Increased Warfarin Effect Decreased Warfarin Effect  Alcohol (large quantities) Antibiotics (esp. Septra/Bactrim, Flagyl, Cipro) Amiodarone (Cordarone) Aspirin (ASA) Cimetidine (Tagamet) Megestrol (Megace) NSAIDs (ibuprofen, naproxen, etc.) Piroxicam (Feldene) Propafenone (  Rythmol SR) °Propranolol (Inderal) °Isoniazid (INH) °Posaconazole (Noxafil) Barbiturates (Phenobarbital) °Carbamazepine (Tegretol) °Chlordiazepoxide (Librium) °Cholestyramine (Questran) °Griseofulvin °Oral Contraceptives °Rifampin °Sucralfate (Carafate) °Vitamin K  ° °Coumadin® (Warfarin) Major Herbal Interactions  °Increased Warfarin Effect Decreased Warfarin Effect  °Garlic °Ginseng °Ginkgo biloba Coenzyme Q10 °Green tea °St.  John’s wort   ° °Coumadin® (Warfarin) FOOD Interactions  °Eat a consistent number of servings per week of foods HIGH in Vitamin K °(1 serving = ½ cup)  °Collards (cooked, or boiled & drained) °Kale (cooked, or boiled & drained) °Mustard greens (cooked, or boiled & drained) °Parsley *serving size only = ¼ cup °Spinach (cooked, or boiled & drained) °Swiss chard (cooked, or boiled & drained) °Turnip greens (cooked, or boiled & drained)  °Eat a consistent number of servings per week of foods MEDIUM-HIGH in Vitamin K °(1 serving = 1 cup)  °Asparagus (cooked, or boiled & drained) °Broccoli (cooked, boiled & drained, or raw & chopped) °Brussel sprouts (cooked, or boiled & drained) *serving size only = ½ cup °Lettuce, raw (green leaf, endive, romaine) °Spinach, raw °Turnip greens, raw & chopped  ° °These websites have more information on Coumadin (warfarin):  www.coumadin.com; °www.ahrq.gov/consumer/coumadin.htm; ° ° °

## 2016-06-05 LAB — CBC
HEMATOCRIT: 34.8 % — AB (ref 39.0–52.0)
Hemoglobin: 11.2 g/dL — ABNORMAL LOW (ref 13.0–17.0)
MCH: 28.1 pg (ref 26.0–34.0)
MCHC: 32.2 g/dL (ref 30.0–36.0)
MCV: 87.2 fL (ref 78.0–100.0)
Platelets: 100 10*3/uL — ABNORMAL LOW (ref 150–400)
RBC: 3.99 MIL/uL — ABNORMAL LOW (ref 4.22–5.81)
RDW: 17.4 % — ABNORMAL HIGH (ref 11.5–15.5)
WBC: 8.1 10*3/uL (ref 4.0–10.5)

## 2016-06-05 LAB — BASIC METABOLIC PANEL
Anion gap: 11 (ref 5–15)
BUN: 21 mg/dL — AB (ref 6–20)
CHLORIDE: 92 mmol/L — AB (ref 101–111)
CO2: 30 mmol/L (ref 22–32)
Calcium: 9.3 mg/dL (ref 8.9–10.3)
Creatinine, Ser: 1.65 mg/dL — ABNORMAL HIGH (ref 0.61–1.24)
GFR calc Af Amer: 47 mL/min — ABNORMAL LOW (ref >60)
GFR calc non Af Amer: 40 mL/min — ABNORMAL LOW (ref >60)
GLUCOSE: 160 mg/dL — AB (ref 65–99)
POTASSIUM: 3.8 mmol/L (ref 3.5–5.1)
Sodium: 133 mmol/L — ABNORMAL LOW (ref 135–145)

## 2016-06-05 LAB — COOXEMETRY PANEL
CARBOXYHEMOGLOBIN: 2.3 % — AB (ref 0.5–1.5)
Carboxyhemoglobin: 2.5 % — ABNORMAL HIGH (ref 0.5–1.5)
Methemoglobin: 0.8 % (ref 0.0–1.5)
Methemoglobin: 1 % (ref 0.0–1.5)
O2 SAT: 92.8 %
O2 Saturation: 64 %
Total hemoglobin: 11.5 g/dL — ABNORMAL LOW (ref 12.0–16.0)
Total hemoglobin: 11.3 g/dL — ABNORMAL LOW (ref 12.0–16.0)

## 2016-06-05 LAB — PROTIME-INR
INR: 2.12
Prothrombin Time: 24.1 seconds — ABNORMAL HIGH (ref 11.4–15.2)

## 2016-06-05 MED ORDER — METOLAZONE 2.5 MG PO TABS
2.5000 mg | ORAL_TABLET | Freq: Once | ORAL | Status: AC
  Administered 2016-06-05: 2.5 mg via ORAL
  Filled 2016-06-05: qty 1

## 2016-06-05 MED ORDER — WARFARIN SODIUM 2 MG PO TABS
2.0000 mg | ORAL_TABLET | Freq: Once | ORAL | Status: AC
  Administered 2016-06-05: 2 mg via ORAL
  Filled 2016-06-05: qty 1

## 2016-06-05 NOTE — Progress Notes (Signed)
Patient ID: Rick [name], male   DOB: 1945-12-24, 71 y.o.   MRN: [id number] \    Advanced Heart Failure Rounding Note  Primary Care: Wende Neighbors, MD Primary Cardiologist: Dr Harl Bowie Primary HF: Dr. Haroldine Laws  Reason for Admission: Acute on Chronic systolic CHF  Subjective:    Sent to ED from Clinic and admitted 05/31/16 with marked volume overload and recurrent low output HF.   Started on milrinone 0.25 mcg/kg/min empirically 05/31/16. Started on Levaquin 06/01/16 with productive cough and rhonchi/wheezing lung sounds with + CXR findings.  Remains on amio gtt for run of VT. Continues to have frequent PVCs.    Milrinone was titrated to 0.5 and norepinephrine 3 added for persistently low co-ox.  Co-ox this morning inaccurate. He diuresed well again yesterday, down another 6 lbs.  CVP starting to come down, 19 this morning.   Has not been out of bed much.  Orthopnea improving.    Objective:   Weight Range: 178 lb 3.2 oz (80.8 kg) Body mass index is 24.85 kg/m.   Vital Signs:   Temp:  [97.3 F (36.3 C)-98.4 F (36.9 C)] 98.3 F (36.8 C) (01/01 0728) Pulse Rate:  [65-160] 73 (01/01 0728) Resp:  [14-24] 16 (01/01 0728) BP: (91-126)/(48-94) 118/74 (01/01 0728) SpO2:  [90 %-100 %] 98 % (01/01 0728) Weight:  [178 lb 3.2 oz (80.8 kg)] 178 lb 3.2 oz (80.8 kg) (01/01 0355) Last BM Date: 06/02/16  Weight change: Filed Weights   06/03/16 0500 06/04/16 0500 06/05/16 0355  Weight: 192 lb 3.2 oz (87.2 kg) 184 lb 8 oz (83.7 kg) 178 lb 3.2 oz (80.8 kg)    Intake/Output:   Intake/Output Summary (Last 24 hours) at 06/05/16 0736 Last data filed at 06/05/16 0728  Gross per 24 hour  Intake           1762.5 ml  Output             6050 ml  Net          -4287.5 ml     Physical Exam: CVP 22 General:  Chronically ill appearing.   HEENT: Normal Neck: supple. JVP 14-16 cm. Carotids 2+ bilat; no bruits. No thyromegaly or nodule noted. Cor: PMI lateral. RRR. 3/6 MR. +S3.  Lungs: Basilar  crackles.   Abdomen: soft, NT, + distended. No HSM appreciated. No bruits or masses. +BS Extremities: no cyanosis, clubbing, rash. 1+ edema to knees bilaterally.   Neuro: alert & orientedx3, cranial nerves grossly intact. moves all 4 extremities w/o difficulty. Affect pleasant.  Telemetry: Reviewed, NSR.     Labs: CBC  Recent Labs  06/04/16 0440  WBC 6.8  HGB 10.3*  HCT 32.8*  MCV 89.9  PLT 86*   Basic Metabolic Panel  Recent Labs  06/03/16 0359 06/04/16 0440  NA 131* 135  K 3.5 3.8  CL 97* 95*  CO2 27 31  GLUCOSE 333* 134*  BUN 26* 24*  CREATININE 1.67* 1.75*  CALCIUM 8.5* 8.9   Liver Function Tests No results for input(s): AST, ALT, ALKPHOS, BILITOT, PROT, ALBUMIN in the last 72 hours. No results for input(s): LIPASE, AMYLASE in the last 72 hours. Cardiac Enzymes No results for input(s): CKTOTAL, CKMB, CKMBINDEX, TROPONINI in the last 72 hours.  BNP: BNP (last 3 results)  Recent Labs  03/23/16 1015 05/11/16 0958 05/31/16 0906  BNP 721.4* 898.0* 1,763.1*    ProBNP (last 3 results) No results for input(s): PROBNP in the last 8760 hours.   D-Dimer No results  for input(s): DDIMER in the last 72 hours. Hemoglobin A1C No results for input(s): HGBA1C in the last 72 hours. Fasting Lipid Panel No results for input(s): CHOL, HDL, LDLCALC, TRIG, CHOLHDL, LDLDIRECT in the last 72 hours. Thyroid Function Tests No results for input(s): TSH, T4TOTAL, T3FREE, THYROIDAB in the last 72 hours.  Invalid input(s): FREET3  Other results:     Imaging/Studies:  No results found.    Medications:     Scheduled Medications: . atorvastatin  40 mg Oral Daily  . ferrous sulfate  325 mg Oral Q breakfast  . furosemide  80 mg Intravenous Q8H  . levofloxacin (LEVAQUIN) IV  750 mg Intravenous Q48H  . magnesium oxide  400 mg Oral Daily  . metolazone  2.5 mg Oral Once  . potassium chloride  40 mEq Oral BID  . sodium chloride flush  3 mL Intravenous Q12H  .  Warfarin - Pharmacist Dosing Inpatient   Does not apply q1800    Infusions: . amiodarone 30 mg/hr (06/05/16 0700)  . milrinone 0.5 mcg/kg/min (06/05/16 0700)  . norepinephrine (LEVOPHED) Adult infusion 3 mcg/min (06/05/16 0700)    PRN Medications: sodium chloride, acetaminophen, albuterol, guaiFENesin-dextromethorphan, morphine CONCENTRATE, ondansetron (ZOFRAN) IV, sodium chloride flush   Assessment/Plan   1. 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  He is markedly volume overloaded and symptomatic with any exertion. He is requiring both milrinone 0.5 and norepinephrine 3 to maintain acceptable co-ox (am reading is inaccurate, need to repeat).  CVP remains 19 though he did diurese quite well again yesterday. Weight coming down.  - Continue IV lasix 80 mg every 8 hrs. Give 2.5 mg metolazone again this am. Continue K supp.  Pending BMET this am.  - Continue current milrinone + norepinephrine as we diurese him.   - No ACE/ARB/Spiro with AKI.  - No BB with low output.  - Poor prognosis.  No good options for advanced therapies with age and end stage RV failure.  Palliative care involved, goal is to keep comfortable and get back home, likely with hospice.  He is DNR.  He will need further diuresis to get him home.  2. AKI on CKD stage III: Creatinine stable with inotropic support though pending am BMET.  3. Respiratory failure with hypoxia: Requiring 02 via [place] to keep sats up.  ? PNA component with wheezing and yellow productive cough. CXR on admission with RLL opacity.   - Continue Levaquin.  - Continue prn nebs.   4. Hx of CVA - Continue coumadin. Pending INR today.  5. VT: had run of 130 beats on 12/28.  - Continue IV amio for now with VT as well as high dose milrinone and norepinephrine.  - Goal K > 4.0 and Mg > 2.0 6. Code Status: Palliative following. Pt now  DNR/DNI.  Wishes to go home with hospice.   Suspect that he has a very poor short term prognosis. Will have him try to walk some today.   Loralie Champagne 06/05/2016 7:36 AM

## 2016-06-05 NOTE — Progress Notes (Signed)
[place] for warfarin Indication: hx PE and CVA  No Known Allergies  Patient Measurements: Height: 5\' 11"  (180.3 cm) Weight: 178 lb 3.2 oz (80.8 kg) IBW/kg (Calculated) : 75.3   Vital Signs: Temp: 98.3 F (36.8 C) (01/01 0728) Temp Source: Oral (01/01 0728) BP: 115/70 (01/01 0800) Pulse Rate: 79 (01/01 0800)  Labs:  Recent Labs  06/03/16 0359 06/04/16 0440 06/05/16 0703  HGB  --  10.3* 11.2*  HCT  --  32.8* 34.8*  PLT  --  86* 100*  LABPROT 36.7* 29.5* 24.1*  INR 3.59 2.73 2.12  CREATININE 1.67* 1.75* 1.65*    Assessment: 68 yom continues on warfarin for hx PE and CVA. His INR was supratherapeutic on 12/30, 12/31, therefore pt did not receive warfarin on these days. Likely seeing a bump in INR due to drug-drug interactions between warfarin, levaquin and amiodarone. INR 2.1 today.  PTA warfarin dose: 4.5 mg on Monday/Thursday, 3 mg all other days  Goal of Therapy:  INR 2-3 Monitor platelets by anticoagulation protocol: Yes   Plan:  1) Warfarin po 2 mg po x 1  2) Daily INR   Hughes Better, PharmD, BCPS Clinical Pharmacist 06/05/2016 9:42 AM

## 2016-06-05 NOTE — Progress Notes (Signed)
Unable to do nurse draws from PICC. Phlebotomy notified twice.

## 2016-06-06 LAB — COOXEMETRY PANEL
CARBOXYHEMOGLOBIN: 2.1 % — AB (ref 0.5–1.5)
Carboxyhemoglobin: 2.3 % — ABNORMAL HIGH (ref 0.5–1.5)
Methemoglobin: 1 % (ref 0.0–1.5)
Methemoglobin: 0.8 % (ref 0.0–1.5)
O2 SAT: 48.6 %
O2 Saturation: 59.8 %
TOTAL HEMOGLOBIN: 12.1 g/dL (ref 12.0–16.0)
Total hemoglobin: 11.5 g/dL — ABNORMAL LOW (ref 12.0–16.0)

## 2016-06-06 LAB — BASIC METABOLIC PANEL
Anion gap: 9 (ref 5–15)
BUN: 26 mg/dL — AB (ref 6–20)
CALCIUM: 8.7 mg/dL — AB (ref 8.9–10.3)
CO2: 32 mmol/L (ref 22–32)
Chloride: 86 mmol/L — ABNORMAL LOW (ref 101–111)
Creatinine, Ser: 1.69 mg/dL — ABNORMAL HIGH (ref 0.61–1.24)
GFR calc Af Amer: 46 mL/min — ABNORMAL LOW (ref >60)
GFR, EST NON AFRICAN AMERICAN: 39 mL/min — AB (ref >60)
GLUCOSE: 254 mg/dL — AB (ref 65–99)
Potassium: 3.7 mmol/L (ref 3.5–5.1)
Sodium: 127 mmol/L — ABNORMAL LOW (ref 135–145)

## 2016-06-06 LAB — CBC
HCT: 33.8 % — ABNORMAL LOW (ref 39.0–52.0)
HEMOGLOBIN: 11.2 g/dL — AB (ref 13.0–17.0)
MCH: 28.9 pg (ref 26.0–34.0)
MCHC: 33.1 g/dL (ref 30.0–36.0)
MCV: 87.1 fL (ref 78.0–100.0)
Platelets: 102 10*3/uL — ABNORMAL LOW (ref 150–400)
RBC: 3.88 MIL/uL — AB (ref 4.22–5.81)
RDW: 17.5 % — ABNORMAL HIGH (ref 11.5–15.5)
WBC: 8.7 10*3/uL (ref 4.0–10.5)

## 2016-06-06 LAB — PROTIME-INR
INR: 2.09
PROTHROMBIN TIME: 23.8 s — AB (ref 11.4–15.2)

## 2016-06-06 MED ORDER — WARFARIN SODIUM 2 MG PO TABS
3.0000 mg | ORAL_TABLET | Freq: Once | ORAL | Status: AC
  Administered 2016-06-06: 3 mg via ORAL
  Filled 2016-06-06: qty 1.5

## 2016-06-06 MED ORDER — TORSEMIDE 20 MG PO TABS
60.0000 mg | ORAL_TABLET | Freq: Two times a day (BID) | ORAL | Status: DC
Start: 2016-06-06 — End: 2016-06-07
  Administered 2016-06-06: 60 mg via ORAL
  Filled 2016-06-06: qty 3

## 2016-06-06 MED ORDER — AMIODARONE HCL 200 MG PO TABS
200.0000 mg | ORAL_TABLET | Freq: Two times a day (BID) | ORAL | Status: DC
  Administered 2016-06-06 – 2016-06-09 (×7): 200 mg via ORAL
  Filled 2016-06-06 (×7): qty 1

## 2016-06-06 NOTE — Progress Notes (Signed)
Inpatient Diabetes Program Recommendations  AACE/ADA: New Consensus Statement on Inpatient Glycemic Control (2015)  Target Ranges:  Prepandial:   less than 140 mg/dL      Peak postprandial:   less than 180 mg/dL (1-2 hours)      Critically ill patients:  140 - 180 mg/dL   Results for ANDREI, ROLDAN (MRN [id number]) as of 06/06/2016 10:07  Ref. Range 06/05/2016 07:03 06/06/2016 04:12  Glucose Latest Ref Range: 65 - 99 mg/dL 160 (H) 254 (H)   Review of Glycemic Control  Diabetes history: No Outpatient Diabetes medications: NA Current orders for Inpatient glycemic control: None  Inpatient Diabetes Program Recommendations: Correction (SSI): Lab glucose 254 mg/dl today at 4:12 am. While inpatient, please consider ordering CBGs with Novolog correction scale ACHS. HgbA1C: Please consider ordering an A1C to evaluate glycemic control over the past 2-3 months.  Thanks, Barnie Alderman, RN, MSN, CDE Diabetes Coordinator Inpatient Diabetes Program [phone] (Team Pager from 8am to 5pm)

## 2016-06-06 NOTE — Care Management Note (Signed)
Case Management Note  Patient Details  Name: Rick [name] MRN: [id number] Date of Birth: 06/10/1945  Subjective/Objective:          Adm w chf          Action/Plan:lives w wife in [place]   Expected Discharge Date:                  Expected Discharge Plan:  Home w Hospice Care  In-House Referral:     Discharge planning Services  CM Consult  Post Acute Care Choice:    Choice offered to:     DME Arranged:    DME Agency:     HH Arranged:    [place] Agency:     Status of Service:  In process, will continue to follow  If discussed at Long Length of Stay Meetings, dates discussed:    Additional Comments: spoke w both hospiec agencies in [place], they do not do home iv milrinone. If goes home on milrinone would have to use hhc agency not hospice. Will await md and pt/family decision on milrinone or not.  Lacretia Leigh, RN 06/06/2016, 2:19 PM

## 2016-06-06 NOTE — Progress Notes (Signed)
Paged with repeat coox 48.6% off levophed.   Will leave off for now to see how he tolerates with no good end point for advanced therapies.  May be heading more rapidly towards full hospice consideration.   Palliative to see again today.   Legrand Como [address]" [place], PA-C 06/06/2016 12:28 PM

## 2016-06-06 NOTE — Progress Notes (Signed)
No charge note.  Met briefly with patient. He states he is interested in going home with milrinone or "whatever the doctors think is best". Have contacted case manager re: [place] hospice choice and will the take patient on milrinone?   Noted Jonni Sanger, PA's note re: "may be heading towards full hospice consideration". Contacted patient's spouse, Terrill Alperin and requested meeting time with her and patient's daughter. Awaiting return call from [place] to schedule meeting time. Likely tomorrow morning so that patient's daughter can attend.   Mariana Kaufman, AGNP-C Palliative Medicine  Please call Palliative Medicine team phone with any questions [phone]. For individual providers please see AMION.

## 2016-06-06 NOTE — Progress Notes (Signed)
ANTICOAGULATION CONSULT NOTE - Follow Up Consult  Pharmacy Consult for Coumadin Indication:hx PE and CVA  No Known Allergies  Patient Measurements: Height: 5\' 11"  (180.3 cm) Weight: 172 lb 13.5 oz (78.4 kg) IBW/kg (Calculated) : 75.3   Vital Signs: Temp: 97.5 F (36.4 C) (01/02 0837) Temp Source: Oral (01/02 0837) BP: 88/63 (01/02 0900) Pulse Rate: 71 (01/02 0900)  Labs:  Recent Labs  06/04/16 0440 06/05/16 0703 06/06/16 0412  HGB 10.3* 11.2* 11.2*  HCT 32.8* 34.8* 33.8*  PLT 86* 100* 102*  LABPROT 29.5* 24.1* 23.8*  INR 2.73 2.12 2.09  CREATININE 1.75* 1.65* 1.69*    Estimated Creatinine Clearance: 43.3 mL/min (by C-G formula based on SCr of 1.69 mg/dL (H)).  Assessment: 70yom continues on coumadin for hx PE and CVA. INR therapeutic at 2.09. Levaquin to end after tomorrow's dose and amiodarone changed to PO today (200mg  bid, was on 200mg  daily pta). His weekly coumadin requirements may decrease.  PTA dose: 3mg  daily except 4.5mg  on Monday/Thursday  Goal of Therapy:  INR 2-3 Monitor platelets by anticoagulation protocol: Yes   Plan:  1) Coumadin 3mg  x 1 tonight 2) Daily INR  Deboraha Sprang 06/06/2016,10:10 AM

## 2016-06-06 NOTE — Progress Notes (Addendum)
Patient ID: Rick PINAULT, male   DOB: 02/09/1946, 70 y.o.   MRN: [id number] \    Advanced Heart Failure Rounding Note  Primary Care: Wende Neighbors, MD Primary Cardiologist: Dr Harl Bowie Primary HF: Dr. Haroldine Laws  Reason for Admission: Acute on Chronic systolic CHF  Subjective:    Sent to ED from Clinic and admitted 05/31/16 with marked volume overload and recurrent low output HF.   Started on milrinone 0.25 mcg/kg/min empirically 05/31/16. Started on Levaquin 06/01/16 with productive cough and rhonchi/wheezing lung sounds with + CXR findings.  Remains on amio gtt for run of VT + high dose milrinone and norepi. Continues to have PVCs.     Coox 60% on milrinone 0.5 mcg/kg/min and norepinephrine 3. CVP now down to 8.  Feeling better. Walked halls OK once he got up out of bed.  Much less orthopnea.  Denies SOB with walking halls.   Creatinine relatively stable at 1.69. Out 3 L yesterday and down another 6 lbs.  Weight below previous baseline.   Objective:   Weight Range: 172 lb 13.5 oz (78.4 kg) Body mass index is 24.11 kg/m.   Vital Signs:   Temp:  [97.8 F (36.6 C)-98.3 F (36.8 C)] 98 F (36.7 C) (01/02 0300) Pulse Rate:  [58-84] 84 (01/02 0700) Resp:  [12-21] 19 (01/02 0700) BP: (95-124)/(59-78) 112/72 (01/02 0700) SpO2:  [90 %-100 %] 96 % (01/02 0700) Weight:  [172 lb 13.5 oz (78.4 kg)] 172 lb 13.5 oz (78.4 kg) (01/02 0300) Last BM Date: 06/02/16  Weight change: Filed Weights   06/04/16 0500 06/05/16 0355 06/06/16 0300  Weight: 184 lb 8 oz (83.7 kg) 178 lb 3.2 oz (80.8 kg) 172 lb 13.5 oz (78.4 kg)    Intake/Output:   Intake/Output Summary (Last 24 hours) at 06/06/16 0715 Last data filed at 06/06/16 0700  Gross per 24 hour  Intake           2003.8 ml  Output             5070 ml  Net          -3066.2 ml     Physical Exam: CVP 7-8 General:  Chronically ill appearing.   HEENT: Normal Neck: supple. JVP 7-8 cm. Carotids 2+ bilat; no bruits. No thyromegaly or nodule  noted. Cor: PMI lateral. RRR. 3/6 MR. +S3.  Lungs: Slightly diminished basilar sounds, otherwise clear.  Abdomen: soft, NT, ND, no HSM. No bruits or masses. +BS  Extremities: no cyanosis, clubbing, rash. Trace ankle edema.  Neuro: alert & orientedx3, cranial nerves grossly intact. moves all 4 extremities w/o difficulty. Affect pleasant.  Telemetry: Reviewed personally, NSR  Labs: CBC  Recent Labs  06/05/16 0703 06/06/16 0412  WBC 8.1 8.7  HGB 11.2* 11.2*  HCT 34.8* 33.8*  MCV 87.2 87.1  PLT 100* [id number]*   Basic Metabolic Panel  Recent Labs  06/05/16 0703 06/06/16 0412  NA 133* 127*  K 3.8 3.7  CL 92* 86*  CO2 30 32  GLUCOSE 160* 254*  BUN 21* 26*  CREATININE 1.65* 1.69*  CALCIUM 9.3 8.7*   Liver Function Tests No results for input(s): AST, ALT, ALKPHOS, BILITOT, PROT, ALBUMIN in the last 72 hours. No results for input(s): LIPASE, AMYLASE in the last 72 hours. Cardiac Enzymes No results for input(s): CKTOTAL, CKMB, CKMBINDEX, TROPONINI in the last 72 hours.  BNP: BNP (last 3 results)  Recent Labs  03/23/16 1015 05/11/16 0958 05/31/16 0906  BNP 721.4* 898.0* 1,763.1*  ProBNP (last 3 results) No results for input(s): PROBNP in the last 8760 hours.   D-Dimer No results for input(s): DDIMER in the last 72 hours. Hemoglobin A1C No results for input(s): HGBA1C in the last 72 hours. Fasting Lipid Panel No results for input(s): CHOL, HDL, LDLCALC, TRIG, CHOLHDL, LDLDIRECT in the last 72 hours. Thyroid Function Tests No results for input(s): TSH, T4TOTAL, T3FREE, THYROIDAB in the last 72 hours.  Invalid input(s): FREET3  Other results:     Imaging/Studies:  No results found.    Medications:     Scheduled Medications: . atorvastatin  40 mg Oral Daily  . ferrous sulfate  325 mg Oral Q breakfast  . furosemide  80 mg Intravenous Q8H  . levofloxacin (LEVAQUIN) IV  750 mg Intravenous Q48H  . magnesium oxide  400 mg Oral Daily  . potassium  chloride  40 mEq Oral BID  . sodium chloride flush  3 mL Intravenous Q12H  . Warfarin - Pharmacist Dosing Inpatient   Does not apply q1800    Infusions: . amiodarone 30 mg/hr (06/05/16 2223)  . milrinone 0.5 mcg/kg/min (06/06/16 0210)  . norepinephrine (LEVOPHED) Adult infusion 3 mcg/min (06/06/16 0637)    PRN Medications: sodium chloride, acetaminophen, albuterol, guaiFENesin-dextromethorphan, morphine CONCENTRATE, ondansetron (ZOFRAN) IV, sodium chloride flush   Assessment/Plan   1. 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  - Stop IV lasix. Transition back to po torsemide 60 mg BID.  - Supp K .   - Continue current milrinone 0.5. Will titrate off levophed and recheck coox this afternoon.  - No ACE/ARB/Spiro with AKI.  - No BB with low output.  - Overall poor prognosis. Goals of care are to optimize for home with hospice. He is DNR.  - Not candidate for advanced therapies with age and end stage RV failure.   2. AKI on CKD stage III: - Creatinine stable with inotropic support and diuresis.   3. Respiratory failure with hypoxia: Requiring 02 via [place] to keep sats up.  ? PNA component with wheezing and yellow productive cough. CXR on admission with RLL opacity.   - Continue Levaquin.  - Continue prn nebs.   4. Hx of CVA - Continue coumadin. Pending INR today.  5. VT: had run of 130 beats on 12/28.  - Continue IV amio for now with VT as well as high dose milrinone and norepinephrine.  - Goal K > 4.0 and Mg > 2.0 6. Code Status:  - Palliative following. Pt now DNR/DNI.   - Wishes to go home with hospice.  Will need ICD deactivated prior to discharge.   Transitioning back to po diuretics. Will attempt to titrate off pressors as tolerated, starting with levophed.   Shirley Friar, PA-C 06/06/2016 7:15 AM   Advanced Heart Failure Team Pager [phone]  (M-F; 7a - 4p)  Please contact [facility] for night-coverage after hours (4p -7a ) and weekends on [email or web address]  Patient is doing better today.  He walked around the unit yesterday and did pretty well.  Creatinine is stable. CVP down to 7-8, very good diuresis last few days.  He remains on milrinone and norepinephrine.   Transition to torsemide today.  I am going to stop the norepinephrine and will continue milrinone for now.  Repeat co-ox off norepinephrine.  Management of the milrinone is going to be more difficult.  If we take him off  milrinone, suspect fluid will build back up relatively quickly.  Would consider sending him home on milrinone as palliation if he remains stable on milrinone and off norepinephrine.  In this case, hospice may not be an option.  If he decompensates off norepinephrine, then would certainly proceed to full hospice care.  Would like palliative care to see him today and discuss options if he remains on milrinone and if he stops it.   Change amiodarone to 200 mg po bid.   Loralie Champagne 06/06/2016 9:47 AM

## 2016-06-07 LAB — CUP PACEART REMOTE DEVICE CHECK
Battery Remaining Percentage: 63 %
Date Time Interrogation Session: 20171207143641
HighPow Impedance: 41 Ohm
Implantable Lead Implant Date: 20080606
Implantable Lead Location: 753860
Implantable Pulse Generator Implant Date: 20131223
Lead Channel Pacing Threshold Amplitude: 1 V
Lead Channel Pacing Threshold Pulse Width: 0.5 ms
Lead Channel Sensing Intrinsic Amplitude: 12 mV
Lead Channel Setting Pacing Amplitude: 2.5 V
Lead Channel Setting Sensing Sensitivity: 0.5 mV
MDC IDC MSMT BATTERY REMAINING LONGEVITY: 70 mo
MDC IDC MSMT BATTERY VOLTAGE: 2.96 V
MDC IDC MSMT LEADCHNL RV IMPEDANCE VALUE: 680 Ohm
MDC IDC SET LEADCHNL RV PACING PULSEWIDTH: 0.5 ms
MDC IDC STAT BRADY RV PERCENT PACED: 1 %
Pulse Gen Serial Number: 7025822

## 2016-06-07 LAB — BASIC METABOLIC PANEL
ANION GAP: 9 (ref 5–15)
BUN: 29 mg/dL — ABNORMAL HIGH (ref 6–20)
CALCIUM: 8.8 mg/dL — AB (ref 8.9–10.3)
CHLORIDE: 87 mmol/L — AB (ref 101–111)
CO2: 35 mmol/L — AB (ref 22–32)
CREATININE: 1.77 mg/dL — AB (ref 0.61–1.24)
GFR calc non Af Amer: 37 mL/min — ABNORMAL LOW (ref >60)
GFR, EST AFRICAN AMERICAN: 43 mL/min — AB (ref >60)
Glucose, Bld: 324 mg/dL — ABNORMAL HIGH (ref 65–99)
Potassium: 3.6 mmol/L (ref 3.5–5.1)
SODIUM: 131 mmol/L — AB (ref 135–145)

## 2016-06-07 LAB — COOXEMETRY PANEL
Carboxyhemoglobin: 2.9 % — ABNORMAL HIGH (ref 0.5–1.5)
Methemoglobin: 0.7 % (ref 0.0–1.5)
O2 SAT: 70.3 %
Total hemoglobin: 11.2 g/dL — ABNORMAL LOW (ref 12.0–16.0)

## 2016-06-07 LAB — GLUCOSE, CAPILLARY
GLUCOSE-CAPILLARY: 158 mg/dL — AB (ref 65–99)
GLUCOSE-CAPILLARY: 325 mg/dL — AB (ref 65–99)
GLUCOSE-CAPILLARY: 511 mg/dL — AB (ref 65–99)
Glucose-Capillary: 243 mg/dL — ABNORMAL HIGH (ref 65–99)

## 2016-06-07 LAB — PROTIME-INR
INR: 1.65
PROTHROMBIN TIME: 19.7 s — AB (ref 11.4–15.2)

## 2016-06-07 MED ORDER — FUROSEMIDE 10 MG/ML IJ SOLN
80.0000 mg | Freq: Two times a day (BID) | INTRAMUSCULAR | Status: AC
  Administered 2016-06-07 – 2016-06-08 (×3): 80 mg via INTRAVENOUS
  Filled 2016-06-07 (×3): qty 8

## 2016-06-07 MED ORDER — MAGNESIUM HYDROXIDE 400 MG/5ML PO SUSP
15.0000 mL | Freq: Every day | ORAL | Status: DC | PRN
  Administered 2016-06-07 – 2016-06-08 (×2): 15 mL via ORAL
  Filled 2016-06-07 (×2): qty 30

## 2016-06-07 MED ORDER — COLCHICINE 0.6 MG PO TABS
1.2000 mg | ORAL_TABLET | Freq: Once | ORAL | Status: AC
  Administered 2016-06-07: 1.2 mg via ORAL
  Filled 2016-06-07: qty 2

## 2016-06-07 MED ORDER — INSULIN ASPART 100 UNIT/ML ~~LOC~~ SOLN
0.0000 [IU] | Freq: Every day | SUBCUTANEOUS | Status: DC
  Administered 2016-06-07: 5 [IU] via SUBCUTANEOUS
  Administered 2016-06-08: 2 [IU] via SUBCUTANEOUS

## 2016-06-07 MED ORDER — INSULIN ASPART 100 UNIT/ML ~~LOC~~ SOLN
0.0000 [IU] | Freq: Three times a day (TID) | SUBCUTANEOUS | Status: DC
  Administered 2016-06-08 (×2): 5 [IU] via SUBCUTANEOUS
  Administered 2016-06-08: 11 [IU] via SUBCUTANEOUS
  Administered 2016-06-09: 2 [IU] via SUBCUTANEOUS
  Administered 2016-06-09: 8 [IU] via SUBCUTANEOUS

## 2016-06-07 MED ORDER — MILRINONE LACTATE IN DEXTROSE 20-5 MG/100ML-% IV SOLN
0.3750 ug/kg/min | INTRAVENOUS | Status: DC
  Administered 2016-06-07 (×2): 0.375 ug/kg/min via INTRAVENOUS
  Filled 2016-06-07 (×2): qty 100

## 2016-06-07 MED ORDER — INSULIN ASPART 100 UNIT/ML ~~LOC~~ SOLN
0.0000 [IU] | Freq: Three times a day (TID) | SUBCUTANEOUS | Status: DC
  Administered 2016-06-07: 11 [IU] via SUBCUTANEOUS
  Administered 2016-06-07: 5 [IU] via SUBCUTANEOUS
  Administered 2016-06-07: 3 [IU] via SUBCUTANEOUS

## 2016-06-07 MED ORDER — WARFARIN SODIUM 2.5 MG PO TABS
4.5000 mg | ORAL_TABLET | Freq: Once | ORAL | Status: AC
  Administered 2016-06-07: 4.5 mg via ORAL
  Filled 2016-06-07: qty 1

## 2016-06-07 MED ORDER — PREDNISONE 20 MG PO TABS
40.0000 mg | ORAL_TABLET | Freq: Once | ORAL | Status: AC
  Administered 2016-06-07: 40 mg via ORAL
  Filled 2016-06-07: qty 2

## 2016-06-07 NOTE — Progress Notes (Signed)
CARDIAC REHAB PHASE I   PRE:  Rate/Rhythm: 72 SR  BP:  Sitting: 100/60        SaO2: 99 2L  MODE:  Ambulation: 390 ft   POST:  Rate/Rhythm: 77 SR  BP:  Sitting: 114/78         SaO2: 100 2L  Pt last seen by cardiac rehab 6/17. Pt up in recliner, ready to walk. Pt ambulated 390 ft on 2L O2, cane, gait belt, IV, assist x1, fairly steady gait, tolerated well with no complaints. Pt sats 99-100 % on 2L O2. Pt appreciative of walk. Pt to bed per pt request after walk, call bell within reach. Will follow.   [place], RN, BSN 06/07/2016 12:09 PM

## 2016-06-07 NOTE — Progress Notes (Signed)
Patient ID: Rick [name], male   DOB: [birth year], 71 y.o.   MRN: [id number] \    Advanced Heart Failure Rounding Note  Primary Care: Wende Neighbors, MD Primary Cardiologist: Dr Harl Bowie Primary HF: Dr. Haroldine Laws  Reason for Admission: Acute on Chronic systolic CHF  Subjective:    Sent to ED from Clinic and admitted 05/31/16 with marked volume overload and recurrent low output HF.   Started on milrinone 0.25 mcg/kg/min empirically 05/31/16. Started on Levaquin 06/01/16 with productive cough and rhonchi/wheezing lung sounds with + CXR findings.  Remains on amio gtt for run of VT + high dose milrinone and norepi. Continues to have PVCs.     Coox 70.3% on milrinone 0.5 mcg/kg/min. Now off norepi.  CVP 11-12 but waveform poor.   Had gout flare yesterday so didn't walk very much.  Feels better today. Much less orthopneic.  Says family can't meet with palliative care today, as they are going to a funeral at their church.   Creatinine 1.69 -> 1.77. stable at 1.69. Out 1.5 L. Weight shows up 4 lbs with transition to po diuretics.    INR down to 1.65. Pharmacy to dose.   Objective:   Weight Range: 176 lb 9.4 oz (80.1 kg) Body mass index is 24.63 kg/m.   Vital Signs:   Temp:  [97.5 F (36.4 C)-98.2 F (36.8 C)] 98.2 F (36.8 C) (01/03 0335) Pulse Rate:  [37-79] 72 (01/03 0600) Resp:  [10-25] 11 (01/03 0700) BP: (84-128)/(36-87) 96/66 (01/03 0700) SpO2:  [93 %-100 %] 99 % (01/03 0600) Weight:  [176 lb 9.4 oz (80.1 kg)] 176 lb 9.4 oz (80.1 kg) (01/03 0335) Last BM Date: 06/02/16  Weight change: Filed Weights   06/05/16 0355 06/06/16 0300 06/07/16 0335  Weight: 178 lb 3.2 oz (80.8 kg) 172 lb 13.5 oz (78.4 kg) 176 lb 9.4 oz (80.1 kg)    Intake/Output:   Intake/Output Summary (Last 24 hours) at 06/07/16 0719 Last data filed at 06/07/16 0700  Gross per 24 hour  Intake           884.02 ml  Output             2425 ml  Net         -1540.98 ml     Physical Exam: CVP 11-12 General:   Elderly and fatigued appearing.    HEENT: Normal Neck: supple. JVP with prominent CV waves. ~11-12 cm. Carotids 2+ bilat; no bruits. No thyromegaly or nodule noted. Cor: PMI laterally displaced. RRR. 3/6 MR/TR. +S3.  Lungs: Slightly decreased basilar sounds.  Abdomen: soft, NT, ND, no HSM. No bruits or masses. +BS  Extremities: no cyanosis, clubbing, rash. Trace ankle edema.   Neuro: alert & orientedx3, cranial nerves grossly intact. moves all 4 extremities w/o difficulty. Affect pleasant.  Telemetry: Reviewed personally, NSR. Continues to have frequent PVCs including bigeminy.   Labs: CBC  Recent Labs  06/05/16 0703 06/06/16 0412  WBC 8.1 8.7  HGB 11.2* 11.2*  HCT 34.8* 33.8*  MCV 87.2 87.1  PLT 100* [id number]*   Basic Metabolic Panel  Recent Labs  06/06/16 0412 06/07/16 0442  NA 127* 131*  K 3.7 3.6  CL 86* 87*  CO2 32 35*  GLUCOSE 254* 324*  BUN 26* 29*  CREATININE 1.69* 1.77*  CALCIUM 8.7* 8.8*   Liver Function Tests No results for input(s): AST, ALT, ALKPHOS, BILITOT, PROT, ALBUMIN in the last 72 hours. No results for input(s): LIPASE, AMYLASE in the last 72 hours.  Cardiac Enzymes No results for input(s): CKTOTAL, CKMB, CKMBINDEX, TROPONINI in the last 72 hours.  BNP: BNP (last 3 results)  Recent Labs  03/23/16 1015 05/11/16 0958 05/31/16 0906  BNP 721.4* 898.0* 1,763.1*    ProBNP (last 3 results) No results for input(s): PROBNP in the last 8760 hours.   D-Dimer No results for input(s): DDIMER in the last 72 hours. Hemoglobin A1C No results for input(s): HGBA1C in the last 72 hours. Fasting Lipid Panel No results for input(s): CHOL, HDL, LDLCALC, TRIG, CHOLHDL, LDLDIRECT in the last 72 hours. Thyroid Function Tests No results for input(s): TSH, T4TOTAL, T3FREE, THYROIDAB in the last 72 hours.  Invalid input(s): FREET3  Other results:     Imaging/Studies:  No results found.    Medications:     Scheduled Medications: . amiodarone   200 mg Oral BID  . atorvastatin  40 mg Oral Daily  . ferrous sulfate  325 mg Oral Q breakfast  . levofloxacin (LEVAQUIN) IV  750 mg Intravenous Q48H  . magnesium oxide  400 mg Oral Daily  . potassium chloride  40 mEq Oral BID  . sodium chloride flush  3 mL Intravenous Q12H  . torsemide  60 mg Oral BID  . Warfarin - Pharmacist Dosing Inpatient   Does not apply q1800    Infusions: . milrinone 0.5 mcg/kg/min (06/07/16 0455)  . norepinephrine (LEVOPHED) Adult infusion Stopped (06/06/16 0954)    PRN Medications: sodium chloride, acetaminophen, albuterol, guaiFENesin-dextromethorphan, morphine CONCENTRATE, ondansetron (ZOFRAN) IV, sodium chloride flush   Assessment/Plan   1. 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  - Continue torsemide 60 mg BID.   - Supp K .   - Coox stable at 70% this am on milrnone 0.5 mcg/kg/min, despite being markedly depressed on same yesterday. ? Accuracy.   - No ACE/ARB/Spiro with AKI.  - No BB with low output.  - Overall poor prognosis. Goals of care are to optimize for home with hospice. He is DNR.  - Not candidate for advanced therapies with age and end stage RV failure.   2. AKI on CKD stage III: - Creatinine relatively stable.    - Urine darkening. Not sure how much dry we can get him with RV failure.  3. Respiratory failure with hypoxia: Requiring 02 via [place] to keep sats up.   - Completed course of Levaquin for PNA.  - Continue prn nebs.   4. Hx of CVA - Continue coumadin. INR 1.65 today. Pharmacy dosing.  5. VT: had run of 130 beats on 12/28.  - Continue IV amio for now with VT as well as high dose milrinone and norepinephrine.  - Goal K > 4.0 and Mg > 2.0 6. Code Status:  - Palliative following and to meet with family, likely tomorrow. Patient may be able to go home on milrinone for palliation, but would have to do this via  [name], and would not be eligible for hospice with milrinone. Pt remains DNR/DNI.   - Will need ICD deactivated prior to discharge.  7. Gout flare - Will give colchicine, as tylenol seemed to help yesterday. 8. Hyperglycemia - Will cover with SSI.  Continue current meds this am. Will discuss discharge planning with MD. Could try to titrate off milrinone for home without, though suspect he would quickly worsen again.   Shirley Friar, PA-C 06/07/2016 7:19 AM   Advanced Heart Failure  Team Pager [phone] (M-F; [place])  Please contact [facility] for night-coverage after hours (4p -7a ) and weekends on [email or web address]  Patient seen and examined with Oda Kilts, PA-C. We discussed all aspects of the encounter. I agree with the assessment and plan as stated above.   Much improved. Nearly fully diuresed. Will continue IV lasix one more day. Attempt to wean milrinone to 0.375. Will discuss with family whether we should consider home inotropes. He has had trouble managing PICC line in the past. Would like to wean off completely if at all possible. Renal function stable. Treat gout and ambulate.   Maddison Kilner,MD 8:37 AM

## 2016-06-07 NOTE — Progress Notes (Signed)
No charge note.  Palliative family meeting rescheduled to 06/08/16 at [place], AGNP-C Palliative Medicine  Please call Palliative Medicine team phone with any questions [phone]. For individual providers please see AMION.

## 2016-06-07 NOTE — Progress Notes (Signed)
CBG 511, no sliding scale orders. Text page to Dr. Aundra Dubin for orders. Per MD place pt on moderate sliding scale and give HS insulin accordingly.

## 2016-06-07 NOTE — Progress Notes (Signed)
ANTICOAGULATION CONSULT NOTE - Follow Up Consult  Pharmacy Consult for Coumadin Indication:hx PE and CVA  No Known Allergies  Patient Measurements: Height: 5\' 11"  (180.3 cm) Weight: 176 lb 9.4 oz (80.1 kg) IBW/kg (Calculated) : 75.3   Vital Signs: Temp: 97.6 F (36.4 C) (01/03 0835) Temp Source: Oral (01/03 0835) BP: 98/70 (01/03 0835) Pulse Rate: 79 (01/03 0835)  Labs:  Recent Labs  06/05/16 0703 06/06/16 0412 06/07/16 0442  HGB 11.2* 11.2*  --   HCT 34.8* 33.8*  --   PLT 100* 102*  --   LABPROT 24.1* 23.8* 19.7*  INR 2.12 2.09 1.65  CREATININE 1.65* 1.69* 1.77*    Estimated Creatinine Clearance: 41.4 mL/min (by C-G formula based on SCr of 1.77 mg/dL (H)).  Assessment: 70yom continues on coumadin for hx PE and CVA. INR now below goal at 1.65. Levaquin to stop after today's dose. Amiodarone changed to po 200mg  bid yesterday. Discussed need for bridge and ok to not bridge for now per Dr. Haroldine Laws.  PTA dose: 3mg  daily except 4.5mg  on Monday/Thursday  Goal of Therapy:  INR 2-3 Monitor platelets by anticoagulation protocol: Yes   Plan:  1) Increase coumadin to 4.5mg  x 1 tonight 2) Daily INR  Deboraha Sprang 06/07/2016,9:56 AM

## 2016-06-07 NOTE — Progress Notes (Signed)
[facility] will follow pt with HF team to support home Milrinone if needed upon DC.   If patient discharges after hours, please call [phone].   Rick [name] 06/07/2016, 7:37 AM

## 2016-06-08 DIAGNOSIS — I472 Ventricular tachycardia, unspecified: Secondary | ICD-10-CM

## 2016-06-08 DIAGNOSIS — Z515 Encounter for palliative care: Secondary | ICD-10-CM

## 2016-06-08 DIAGNOSIS — K5900 Constipation, unspecified: Secondary | ICD-10-CM

## 2016-06-08 LAB — BASIC METABOLIC PANEL
ANION GAP: 9 (ref 5–15)
BUN: 32 mg/dL — ABNORMAL HIGH (ref 6–20)
CALCIUM: 9.3 mg/dL (ref 8.9–10.3)
CO2: 32 mmol/L (ref 22–32)
Chloride: 90 mmol/L — ABNORMAL LOW (ref 101–111)
Creatinine, Ser: 1.58 mg/dL — ABNORMAL HIGH (ref 0.61–1.24)
GFR, EST AFRICAN AMERICAN: 49 mL/min — AB (ref >60)
GFR, EST NON AFRICAN AMERICAN: 43 mL/min — AB (ref >60)
GLUCOSE: 301 mg/dL — AB (ref 65–99)
POTASSIUM: 3.8 mmol/L (ref 3.5–5.1)
SODIUM: 131 mmol/L — AB (ref 135–145)

## 2016-06-08 LAB — PROTIME-INR
INR: 1.76
Prothrombin Time: 20.8 seconds — ABNORMAL HIGH (ref 11.4–15.2)

## 2016-06-08 LAB — COOXEMETRY PANEL
CARBOXYHEMOGLOBIN: 2.5 % — AB (ref 0.5–1.5)
CARBOXYHEMOGLOBIN: 2.2 % — AB (ref 0.5–1.5)
METHEMOGLOBIN: 0.7 % (ref 0.0–1.5)
Methemoglobin: 0.9 % (ref 0.0–1.5)
O2 SAT: 55.8 %
O2 Saturation: 76 %
TOTAL HEMOGLOBIN: 10.9 g/dL — AB (ref 12.0–16.0)
Total hemoglobin: 11.8 g/dL — ABNORMAL LOW (ref 12.0–16.0)

## 2016-06-08 LAB — GLUCOSE, CAPILLARY
GLUCOSE-CAPILLARY: 334 mg/dL — AB (ref 65–99)
GLUCOSE-CAPILLARY: 224 mg/dL — AB (ref 65–99)
GLUCOSE-CAPILLARY: 207 mg/dL — AB (ref 65–99)
GLUCOSE-CAPILLARY: 208 mg/dL — AB (ref 65–99)

## 2016-06-08 LAB — MAGNESIUM: Magnesium: 2.2 mg/dL (ref 1.7–2.4)

## 2016-06-08 MED ORDER — MILRINONE LACTATE IN DEXTROSE 20-5 MG/100ML-% IV SOLN: 0.1250 ug/kg/min | INTRAVENOUS | Status: DC

## 2016-06-08 MED ORDER — TORSEMIDE 20 MG PO TABS
80.0000 mg | ORAL_TABLET | Freq: Two times a day (BID) | ORAL | Status: DC
  Administered 2016-06-08: 80 mg via ORAL
  Filled 2016-06-08 (×2): qty 4

## 2016-06-08 MED ORDER — WARFARIN SODIUM 2.5 MG PO TABS
4.5000 mg | ORAL_TABLET | Freq: Once | ORAL | Status: AC
  Administered 2016-06-08: 18:00:00 4.5 mg via ORAL
  Filled 2016-06-08: qty 1

## 2016-06-08 MED ORDER — MILRINONE LACTATE IN DEXTROSE 20-5 MG/100ML-% IV SOLN
0.2500 ug/kg/min | INTRAVENOUS | Status: DC
Start: 2016-06-08 — End: 2016-06-08
  Filled 2016-06-08: qty 100

## 2016-06-08 MED ORDER — GLYCERIN (LAXATIVE) 2.1 G RE SUPP
1.0000 | RECTAL | Status: AC
  Administered 2016-06-08: 1 via RECTAL
  Filled 2016-06-08: qty 1

## 2016-06-08 MED ORDER — POLYETHYLENE GLYCOL 3350 17 G PO PACK
17.0000 g | PACK | Freq: Two times a day (BID) | ORAL | Status: DC
  Administered 2016-06-08 – 2016-06-09 (×3): 17 g via ORAL
  Filled 2016-06-08 (×3): qty 1

## 2016-06-08 NOTE — Care Management Note (Signed)
Case Management Note  Patient Details  Name: Rick [name] MRN: [id number] Date of Birth: February 19, 1946  Subjective/Objective:    Adm w chf                Action/Plan:lives w wife in [place]   Expected Discharge Date:                  Expected Discharge Plan:  Home w Hospice Care  In-House Referral:  Hospice / Palliative Care  Discharge planning Services  CM Consult  Post Acute Care Choice:    Choice offered to:     DME Arranged:    DME Agency:     HH Arranged:    HH Agency:     Status of Service:  In process, will continue to follow  If discussed at Long Length of Stay Meetings, dates discussed:    Additional Comments:order for home hospice. Went over hospice agencies w pt and he prefers [facility]. Spoke w mrs Lininger wife and no pref just what pt wants. Ref to [facility] ph [phone], fax [phone]. Milrinone being weaned. Will go home w hospice w no milrin per md note.  Lacretia Leigh, RN 06/08/2016, 3:18 PM

## 2016-06-08 NOTE — Progress Notes (Signed)
No charge note:  Patient's daughter, Marlowe Kays, contacted me and informed me that patient has chosen Hospice with no milrinone. Orders entered for case manager to arrange Hospice services at discharge.  Mariana Kaufman, AGNP-C Palliative Medicine  Please call Palliative Medicine team phone with any questions [phone]. For individual providers please see AMION.

## 2016-06-08 NOTE — Progress Notes (Signed)
Inpatient Diabetes Program Recommendations  AACE/ADA: New Consensus Statement on Inpatient Glycemic Control (2015)  Target Ranges:  Prepandial:   less than 140 mg/dL      Peak postprandial:   less than 180 mg/dL (1-2 hours)      Critically ill patients:  140 - 180 mg/dL   Lab Results  Component Value Date   GLUCAP 334 (H) 06/08/2016    Review of Glycemic Control:  Results for Rick [name], Rick [name] (MRN [id number]) as of 06/08/2016 11:31  Ref. Range 06/07/2016 17:18 06/07/2016 21:32 06/08/2016 09:12  Glucose-Capillary Latest Ref Range: 65 - 99 mg/dL 325 (H) 511 (HH) 334 (H)   Diabetes history: None noted Outpatient Diabetes medications: None Current orders for Inpatient glycemic control:  Novolog moderate tid with meals  Inpatient Diabetes Program Recommendations:  Note blood sugars increased with steroids.  If appropriate, consider adding Levemir 15 units q AM and Novolog 4 units tid with meals (hold if patient eats less than 50%).  Thanks, Adah Perl, RN, BC-ADM Inpatient Diabetes Coordinator Pager [phone] (8a-5p)

## 2016-06-08 NOTE — Progress Notes (Signed)
Brief Nutrition Education Note  RD received consult for diet education for Heart Healthy/Carb Modified diet. Chart reviewed; per MD notes, pt with poor prognosis and will likely discharge home with hospice. Palliative Care Team following and family meeting is scheduled today.   RD will follow-up with education needs based upon results of goals of care meeting.   Ace Bergfeld A. Jimmye Norman, RD, LDN, CDE Pager: [phone] After hours Pager: [phone]

## 2016-06-08 NOTE — Progress Notes (Signed)
Daily Progress Note   Patient Name: Rick [name]       Date: 06/08/2016 DOB: 12/25/1945  Age: 71 y.o. MRN#: [id number] Attending Physician: Jolaine Artist, MD Primary Care Physician: Wende Neighbors, MD Admit Date: 05/31/2016  Reason for Consultation/Follow-up: Establishing goals of care and Non pain symptom management  Subjective: Patient complaining of constipation. Has not had a BM since admission. States he can feel hard stool in his rectum. Denies history of hemorrhoids. Denies nausea, vomiting or abdominal pain. Noted he received MOM last night, and prune juice per nursing with no results. He has used enemas at home with relief.    Patient requested palliative team meet with his wife and children without him. Met with Lajuana Carry, and Annie Main. Discussed options of home with hospice vs. Home with HH and milrinone infusion. HF team expressed concerns with PICC management at home, possible infections, benefits of therapy vs burdens of continuous IV infusion. Letta Median and Marlowe Kays note that patient would not like not being able to shower, and would be likely to attempt to manipulate PICC.  Letta Median and [place] agree that they believe best option is home with hospice without milrinone, however, they would like to confirm this with patient before making final decision.  Family also expressed concerns re: patient driving and requested a letter to show patient that recommends that he doesn't drive. They state that he will drive and then get SOB or "worn out" and call and request them to come pick him up.  Review of Systems  Gastrointestinal: Positive for constipation.  All other systems reviewed and are negative.   Length of Stay: 8  Current Medications: Scheduled Meds:  . amiodarone  200 mg Oral BID  .  atorvastatin  40 mg Oral Daily  . ferrous sulfate  325 mg Oral Q breakfast  . insulin aspart  0-15 Units Subcutaneous TID WC  . insulin aspart  0-5 Units Subcutaneous QHS  . magnesium oxide  400 mg Oral Daily  . polyethylene glycol  17 g Oral BID  . potassium chloride  40 mEq Oral BID  . sodium chloride flush  3 mL Intravenous Q12H  . torsemide  80 mg Oral BID  . warfarin  4.5 mg Oral ONCE-1800  . Warfarin - Pharmacist Dosing Inpatient   Does not apply [phone]  Continuous Infusions: . milrinone 0.25 mcg/kg/min (06/08/16 0918)    PRN Meds: sodium chloride, acetaminophen, albuterol, guaiFENesin-dextromethorphan, magnesium hydroxide, morphine CONCENTRATE, ondansetron (ZOFRAN) IV, sodium chloride flush  Physical Exam  Pulmonary/Chest: Effort normal and breath sounds normal.  Abdominal: Soft. Bowel sounds are normal. He exhibits no distension and no mass. There is no tenderness. There is no guarding.            Vital Signs: BP 109/77 (BP Location: Left Arm)   Pulse 86   Temp 98 F (36.7 C) (Oral)   Resp 20   Ht 5' 11"  (1.803 m)   Wt 78 kg (171 lb 15.3 oz)   SpO2 96%   BMI 23.98 kg/m  SpO2: SpO2: 96 % O2 Device: O2 Device: Not Delivered O2 Flow Rate: O2 Flow Rate (L/min): 2 L/min  Intake/output summary:  Intake/Output Summary (Last 24 hours) at 06/08/16 1300 Last data filed at 06/08/16 1200  Gross per 24 hour  Intake            445.2 ml  Output             2451 ml  Net          -2005.8 ml   LBM: Last BM Date: 07/03/16 Baseline Weight: Weight: 95.3 kg (210 lb) Most recent weight: Weight: 78 kg (171 lb 15.3 oz)       Palliative Assessment/Data: PPS: 50%    Flowsheet Rows   Flowsheet Row Most Recent Value  Intake Tab  Referral Department  Cardiology  Unit at Time of Referral  Intermediate Care Unit  Palliative Care Primary Diagnosis  Cardiac  Date Notified  06/01/16  Palliative Care Type  Return patient Palliative Care  Reason for referral  Clarify Goals of  Care, Advance Care Planning  Date of Admission  05/31/16  Date first seen by Palliative Care  06/01/16  # of days Palliative referral response time  0 Day(s)  # of days IP prior to Palliative referral  1  Clinical Assessment  Psychosocial & Spiritual Assessment  Palliative Care Outcomes  Patient/Family meeting held?  Yes  Who was at the meeting?  Patient, spouse, daughter  Palliative Care Outcomes  Improved non-pain symptom therapy, Clarified goals of care, Provided advance care planning, Changed CPR status, Transitioned to hospice  Patient/Family wishes: Interventions discontinued/not started   Mechanical Ventilation, Tube feedings/TPN  Palliative Care follow-up planned  Yes, Home  Palliative Care Follow-up Reason  Advanced care planning, Non-pain symptom      Patient Active Problem List   Diagnosis Date Noted  . SOB (shortness of breath)   . Goals of care, counseling/discussion   . Advance care planning   . CHF (congestive heart failure) ([place]) 05/31/2016  . CHF NYHA class IV, acute, systolic ([place]) 41/74/0814  . PSVT (paroxysmal supraventricular tachycardia) ([place])   . CKD (chronic kidney disease) stage 3, GFR 30-59 ml/min   . Palliative care encounter   . Chronic combined systolic and diastolic CHF (congestive heart failure) ([place])   . Coronary artery disease involving coronary bypass graft of native heart without angina pectoris   . Subdural hematoma ([place]) 11/13/2015  . CAD (coronary artery disease) of artery bypass graft 10/21/2015  . CAD (coronary artery disease) 10/21/2015  . Anasarca 10/07/2015  . Normocytic anemia 10/07/2015  . Leg pain 11/13/2014  . Encounter for therapeutic drug monitoring 08/05/2013  . Chronic passive hepatic congestion 07/09/2012  . Chest pain 07/01/2012  . Jaundice 05/15/2012  . Syncope 04/12/2012  .  Abnormal liver function tests 04/12/2012  . Chronic systolic heart failure ([place]) 04/12/2012  . Edema 04/12/2012  . Sleep apnea 04/12/2012  .  Automatic implantable cardioverter-defibrillator in situ 03/12/2012  . Encounter for long-term (current) use of anticoagulants 12/18/2011  . Essential hypertension   . H/O atherosclerotic cardiovascular disease   . Ischemic cardiomyopathy   . Other pulmonary embolism and infarction   . Hyperlipidemia   . DOE (dyspnea on exertion)     Palliative Care Assessment & Plan   Patient Profile: 71 y.o. male  with past medical history of ischemic cardiomyopathy (s/p CABG 2003), CHF (EF 25% in 2016),   admitted on 05/31/2016 with CHF exacerbation with volume overload and recurrent low output. He is diuresing Palliative medicine consulted for [place].  Assessment/Recommendations/Plan   GOC/Disposition: Family to discuss options with patient and contact me with decision (Hospice vs home with milrinone) They are leaning towards hospice.  Constipation: likely multifactorial r/t reduced mobility, medications, reduced po intake- Glycerin suppository now; Miralax 17gm BID  Goals of Care and Additional Recommendations:  Limitations on Scope of Treatment: Avoid Hospitalization, No Artificial Feeding, No Surgical Procedures and No Tracheostomy  Code Status:  DNR  Prognosis:   < 6 months d/t end stage CHF with therapies maximized, anticipating transition to Hospice  Discharge Planning:  To Be Determined [facility] with [facility] vs Home with Hospice  Care plan was discussed with patient, his daughters and his wife.   Thank you for allowing the Palliative Medicine Team to assist in the care of this patient.   Time In: 1000 1100 Time Out: 1045 1200 Total Time 105 minutes Prolonged Time Billed Yes      Greater than 50%  of this time was spent counseling and coordinating care related to the above assessment and plan.  Mariana Kaufman, AGNP-C Palliative Medicine   Please contact Palliative Medicine Team phone at [phone] for questions and concerns.

## 2016-06-08 NOTE — Progress Notes (Signed)
Patient ID: Rick [name], male   DOB: 1946/05/27, 71 y.o.   MRN: [id number] \    Advanced Heart Failure Rounding Note  Primary Care: Wende Neighbors, MD Primary Cardiologist: Dr Harl Bowie Primary HF: Dr. Haroldine Laws  Reason for Admission: Acute on Chronic systolic CHF  Subjective:    Sent to ED from Clinic and admitted 05/31/16 with marked volume overload and recurrent low output HF.   Started on milrinone 0.25 mcg/kg/min empirically 05/31/16. Started on Levaquin 06/01/16 with productive cough and rhonchi/wheezing lung sounds with + CXR findings.  Remains on amio gtt for run of VT + high dose milrinone and norepi. Continues to have PVCs.     Coox 55% on milrinone 0.375 mcg/kg/min. CVP 10-11  Gout improved. Walked 390 ft yesterday without difficulty. Was not previously on 02 at home. Sugars have been running high.  Family meeting today.   Creatinine 1.69 -> 1.77 -> 1.58.  Out 2.6 L and back down 5 lbs with IV lasix yesterday. Down 31 lbs total.   INR 1.76. Pharmacy dosing.    Objective:   Weight Range: 171 lb 15.3 oz (78 kg) Body mass index is 23.98 kg/m.   Vital Signs:   Temp:  [97.5 F (36.4 C)-98.2 F (36.8 C)] 97.6 F (36.4 C) (01/04 0354) Pulse Rate:  [70-83] 83 (01/04 0400) Resp:  [9-22] 12 (01/04 0400) BP: (98-120)/(59-79) 98/67 (01/04 0400) SpO2:  [93 %-100 %] 96 % (01/04 0400) Weight:  [171 lb 15.3 oz (78 kg)] 171 lb 15.3 oz (78 kg) (01/04 0354) Last BM Date: 07/03/16  Weight change: Filed Weights   06/06/16 0300 06/07/16 0335 06/08/16 0354  Weight: 172 lb 13.5 oz (78.4 kg) 176 lb 9.4 oz (80.1 kg) 171 lb 15.3 oz (78 kg)    Intake/Output:   Intake/Output Summary (Last 24 hours) at 06/08/16 0711 Last data filed at 06/08/16 0600  Gross per 24 hour  Intake            470.5 ml  Output             3170 ml  Net          -2699.5 ml     Physical Exam: CVP 10-11 General:  Elderly appearing. NAD lying in bed.     HEENT: Normal Neck: supple. JVP with prominent CV  waves. ~10-11 cm. Carotids 2+ bilat; no bruits. No thyromegaly or nodule noted.  Cor: PMI laterally displaced. RRR. 3/6 MR/TR. +S3.  Lungs: Slightly diminished basilar sounds. Clear otherwise. Abdomen: soft, NT, ND, no HSM. No bruits or masses. +BS  Extremities: no cyanosis, clubbing, rash. No peripheral edema.    Neuro: alert & orientedx3, cranial nerves grossly intact. moves all 4 extremities w/o difficulty. Affect pleasant.  Telemetry: reviewed personally, NSR 80s. + PVCs.    Labs: CBC  Recent Labs  06/06/16 0412  WBC 8.7  HGB 11.2*  HCT 33.8*  MCV 87.1  PLT 361*   Basic Metabolic Panel  Recent Labs  06/07/16 0442 06/08/16 0431  NA 131* 131*  K 3.6 3.8  CL 87* 90*  CO2 35* 32  GLUCOSE 324* 301*  BUN 29* 32*  CREATININE 1.77* 1.58*  CALCIUM 8.8* 9.3   Liver Function Tests No results for input(s): AST, ALT, ALKPHOS, BILITOT, PROT, ALBUMIN in the last 72 hours. No results for input(s): LIPASE, AMYLASE in the last 72 hours. Cardiac Enzymes No results for input(s): CKTOTAL, CKMB, CKMBINDEX, TROPONINI in the last 72 hours.  BNP: BNP (last 3 results)  Recent Labs  03/23/16 1015 05/11/16 0958 05/31/16 0906  BNP 721.4* 898.0* 1,763.1*    ProBNP (last 3 results) No results for input(s): PROBNP in the last 8760 hours.   D-Dimer No results for input(s): DDIMER in the last 72 hours. Hemoglobin A1C No results for input(s): HGBA1C in the last 72 hours. Fasting Lipid Panel No results for input(s): CHOL, HDL, LDLCALC, TRIG, CHOLHDL, LDLDIRECT in the last 72 hours. Thyroid Function Tests No results for input(s): TSH, T4TOTAL, T3FREE, THYROIDAB in the last 72 hours.  Invalid input(s): FREET3  Other results:     Imaging/Studies:  No results found.    Medications:     Scheduled Medications: . amiodarone  200 mg Oral BID  . atorvastatin  40 mg Oral Daily  . ferrous sulfate  325 mg Oral Q breakfast  . furosemide  80 mg Intravenous BID  . insulin  aspart  0-15 Units Subcutaneous TID WC  . insulin aspart  0-5 Units Subcutaneous QHS  . magnesium oxide  400 mg Oral Daily  . potassium chloride  40 mEq Oral BID  . sodium chloride flush  3 mL Intravenous Q12H  . Warfarin - Pharmacist Dosing Inpatient   Does not apply q1800    Infusions: . milrinone 0.375 mcg/kg/min (06/07/16 2110)    PRN Medications: sodium chloride, acetaminophen, albuterol, guaiFENesin-dextromethorphan, magnesium hydroxide, morphine CONCENTRATE, ondansetron (ZOFRAN) IV, sodium chloride flush   Assessment/Plan   1. 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  - Give IV lasix this am. Was on torsemide 60 BID at home. May need to increase to 80 mg BID. Will try tonight.  - Monitor electrolytes.  - Coox marginal at 55.8% this am on milrnone 0.375 mcg/kg/min. Will give dose of IV lasix this am and try to cut back to 0.25. - No ACE/ARB/Spiro with AKI.  - No BB with low output.  - Overall poor prognosis. Goals of care are likely home with hospice.  He is DNR/DNI. Family meeting today.  - Not candidate for advanced therapies with age and end stage RV failure.  Has previously had trouble managing a PICC in the hospital, so thought to be poor candidate for home PICC. Has done well so far this admission.  2. AKI on CKD stage III: - Creatinine stable. Continue to follow with diuretic adjustment.  3. Respiratory failure with hypoxia: Requiring 02 via [place] to keep sats up.   - Completed course of Levaquin for PNA.  - Continue prn nebs.   - Did not need 02 prior to admit.  Will need definitive ambulation with/without 02 for sats.  4. Hx of CVA - Continue coumadin. INR 1.7 today. Dosing per Pharmacy.   5. VT: had run of 130 beats on 12/28.  - Quiescent. Now on po amio.   - Goal K > 4.0 and Mg > 2.0. - Continue current K supp with transition to po meds.  Check Mg.  6. Code Status:  - Palliative following and to meet with family today.  Pt is DNR/DNI.  - Will need ICD deactivated prior to discharge.  7. Acute gout flare - Given dose of prednisone.  - Continue colchicine.  8. Hyperglycemia - SSI added 06/07/16  Will give dose of IV lasix this morning and continue to attempt to wean milrinone, despite marginal coox.  Pt has previously been thought to be a poor home PICC candidate.  Shirley Friar, PA-C 06/08/2016 7:11 AM   Advanced Heart Failure Team Pager [phone] (M-F; 7a - 4p)  Please contact [facility] for night-coverage after hours (4p -7a ) and weekends on [email or web address]  Patient seen and examined with Oda Kilts, PA-C. We discussed all aspects of the encounter. I agree with the assessment and plan as stated above.   He has improved with milrinone and IV lasix. Volume status nearing euvolemia. Co-ox marginal. He and his family met with Palliative Care today and have decided against home inotropes. He has had trouble with PICC line before so  Think this is very appropriate decision. Will continue IV lasix today. Wean milrinone. Continue treatment for gout and encourage ambulation. Code status changed to DNR/DNI. Will deactivate ICD.  Carle Dargan,MD 9:18 PM

## 2016-06-08 NOTE — Progress Notes (Signed)
ANTICOAGULATION CONSULT NOTE - Follow Up Consult  Pharmacy Consult for Coumadin Indication:hx PE and CVA  No Known Allergies  Patient Measurements: Height: 5\' 11"  (180.3 cm) Weight: 171 lb 15.3 oz (78 kg) IBW/kg (Calculated) : 75.3   Vital Signs: Temp: 97.7 F (36.5 C) (01/04 0917) Temp Source: Oral (01/04 0917) BP: 97/76 (01/04 0917) Pulse Rate: 84 (01/04 0917)  Labs:  Recent Labs  06/06/16 0412 06/07/16 0442 06/08/16 0431  HGB 11.2*  --   --   HCT 33.8*  --   --   PLT 102*  --   --   LABPROT 23.8* 19.7* 20.8*  INR 2.09 1.65 1.76  CREATININE 1.69* 1.77* 1.58*    Estimated Creatinine Clearance: 46.3 mL/min (by C-G formula based on SCr of 1.58 mg/dL (H)).  Assessment: 70yom continues on coumadin for hx PE and CVA. INR below goal but trending up to 1.76.  Levaquin course complete. Continues on amiodarone 200mg  po bid. Discussed need for bridge and ok to not bridge for now per Dr. Haroldine Laws.  PTA dose: 3mg  daily except 4.5mg  on Monday/Thursday  Goal of Therapy:  INR 2-3 Monitor platelets by anticoagulation protocol: Yes   Plan:  1) Coumadin 4.5mg  x 1 tonight 2) Daily INR  Deboraha Sprang 06/08/2016,11:50 AM

## 2016-06-08 NOTE — Progress Notes (Signed)
CARDIAC REHAB PHASE I   PRE:  Rate/Rhythm: 92 SR    BP: sitting 110/75    SaO2: 99 RA  MODE:  Ambulation: 330 ft   POST:  Rate/Rhythm: 92 SR    BP: sitting 114/85     SaO2: 99-100 RA  I came earlier and pt was trying to have BM. He feels much better now. Willing to walk. Tolerated fairly well, steady, no c/o SOB. To recliner, VSS.  [place], ACSM 06/08/2016 2:39 PM

## 2016-06-09 LAB — GLUCOSE, CAPILLARY
GLUCOSE-CAPILLARY: 127 mg/dL — AB (ref 65–99)
GLUCOSE-CAPILLARY: 264 mg/dL — AB (ref 65–99)

## 2016-06-09 LAB — BASIC METABOLIC PANEL
Anion gap: 8 (ref 5–15)
BUN: 35 mg/dL — AB (ref 6–20)
CO2: 34 mmol/L — ABNORMAL HIGH (ref 22–32)
CREATININE: 1.61 mg/dL — AB (ref 0.61–1.24)
Calcium: 9.4 mg/dL (ref 8.9–10.3)
Chloride: 92 mmol/L — ABNORMAL LOW (ref 101–111)
GFR, EST AFRICAN AMERICAN: 48 mL/min — AB (ref >60)
GFR, EST NON AFRICAN AMERICAN: 42 mL/min — AB (ref >60)
Glucose, Bld: 133 mg/dL — ABNORMAL HIGH (ref 65–99)
POTASSIUM: 4.6 mmol/L (ref 3.5–5.1)
SODIUM: 134 mmol/L — AB (ref 135–145)

## 2016-06-09 LAB — COOXEMETRY PANEL
Carboxyhemoglobin: 1.5 % (ref 0.5–1.5)
METHEMOGLOBIN: 0.9 % (ref 0.0–1.5)
O2 Saturation: 50.5 %
TOTAL HEMOGLOBIN: 13 g/dL (ref 12.0–16.0)

## 2016-06-09 LAB — PROTIME-INR
INR: 1.77
Prothrombin Time: 20.9 seconds — ABNORMAL HIGH (ref 11.4–15.2)

## 2016-06-09 MED ORDER — AMIODARONE HCL 100 MG PO TABS: 200.0000 mg | ORAL_TABLET | Freq: Two times a day (BID) | ORAL | 3 refills | Status: AC

## 2016-06-09 MED ORDER — ACETAMINOPHEN 325 MG PO TABS: 650.0000 mg | ORAL_TABLET | ORAL | Status: AC | PRN

## 2016-06-09 MED ORDER — FUROSEMIDE 10 MG/ML IJ SOLN
80.0000 mg | Freq: Once | INTRAMUSCULAR | Status: AC
Start: 2016-06-09 — End: 2016-06-09
  Administered 2016-06-09: 80 mg via INTRAVENOUS
  Filled 2016-06-09: qty 8

## 2016-06-09 MED ORDER — WARFARIN SODIUM 3 MG PO TABS: 3.0000 mg | ORAL_TABLET | ORAL | 3 refills | Status: AC

## 2016-06-09 MED ORDER — GUAIFENESIN-DM 100-10 MG/5ML PO SYRP: 5.0000 mL | ORAL_SOLUTION | ORAL | 0 refills | Status: AC | PRN

## 2016-06-09 MED ORDER — WARFARIN SODIUM 2 MG PO TABS
4.5000 mg | ORAL_TABLET | Freq: Once | ORAL | Status: AC
Start: 2016-06-09 — End: 2016-06-09
  Administered 2016-06-09: 4.5 mg via ORAL
  Filled 2016-06-09: qty 1

## 2016-06-09 MED ORDER — WARFARIN SODIUM 3 MG PO TABS: 3.0000 mg | ORAL_TABLET | ORAL | Status: DC

## 2016-06-09 MED ORDER — TORSEMIDE 20 MG PO TABS: 60.0000 mg | ORAL_TABLET | Freq: Two times a day (BID) | ORAL | Status: DC

## 2016-06-09 MED ORDER — METOLAZONE 2.5 MG PO TABS: 2.5000 mg | ORAL_TABLET | ORAL | 3 refills | Status: DC

## 2016-06-09 NOTE — Progress Notes (Signed)
Ambulated along the hallway for 5 min. Tolerated well With pulse ox- 97 on room air.

## 2016-06-09 NOTE — Discharge Summary (Signed)
Advanced Heart Failure Discharge Note  Discharge Summary   Patient ID: Rick [name] MRN: [id number], DOB/AGE: 1946/02/21 71 y.o. Admit date: 05/31/2016 D/C date:     06/09/2016   Primary Discharge Diagnoses:  1. 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 AKI on CKD stage III 3. Acute respiratory failure with hypoxia 4. CAP 5. Hx of CVA on coumadin for anti-coag 6. Ventricular Tachycardia 7. Acute gout flare 8. Hyperglycemia 9. Code Status - Now DNR/DNI -> home with hospice.   Hospital Course:   Rick [name] is a 71 y.o. male with history of ischemic cardiomyopathy status post CABG in 2003 as well as previous stenting. Most recent 2-D echo in 2016 EF 25% with restrictive diastolic dysfunction, moderate RV dysfunction PAS P 64, status post ICD followed by Dr. Rayann Heman with normal check in 02/2015. In June 2017 had subdural hematoma.  Pt admitted 05/31/17 with recurrent low output HF and weight gain of > 20 lbs over several weeks despite adjustment of diuretics.   BNP elevated and CXR consistent with PNA. Completed course of levaquin for CAP.   Pt initiated on milrinone empirically with history of low output HF. PICC line placed for CVP and Mixed venous saturations. CVP > 25 initially. Coox remained depressed despite titration of milrinone up to max dose of 0.5 mcg/kg/min. 3 mcg of levophed added 06/02/16 with improvement of pt coox.  Diuresis also improved. Overall, pt diuresed 27.6 L and down 28 lbs from admission weight.   Hospital course complicated by VT ([id number] beats) and placed on Amio gtt with no further prolonged episodes. Transitioned back to oral amiodarone for home. Pt also had new O2 desaturation that resolved after PNA treated. Ambulation prior to d/c showed 02 saturation of 97% on RA.    Palliative care was consulted with End Stage Biventricular HF for goals of care.  Family and pt decided on DNR/DNI and ICD deactivation. Pressors weaned as tolerated and pressures remained  stable. Mixed venous saturation did worsen with weaning of milrinone so briefly considered sending patient home with milrinone.   Palliative Family meeting on 06/08/16 with decision to discontinue milrinone and take home WITHOUT PICC, which we agreed with due to poor self management of PICC line during previous admission. Pt is not candidate for advanced therapies with advanced age, co-morbidities, and RV failure. ICD deactivated prior to discharge.   Rick [name] will be discharged home in tenuous condition under Hospice Care.  The HF clinic and Dr. Haroldine Laws will continue to manage his medications to try and best palliate his symptoms. Family and pt understand that he very likely has a prognosis of < 6 months.  Discharge Weight Range: 174 lbs Discharge Vitals: Blood pressure (!) 92/54, pulse 73, temperature 97.6 F (36.4 C), temperature source Oral, resp. rate 15, height 5\' 11"  (1.803 m), weight 174 lb 9.7 oz (79.2 kg), SpO2 98 %.  Labs: Lab Results  Component Value Date   WBC 8.7 06/06/2016   HGB 11.2 (L) 06/06/2016   HCT 33.8 (L) 06/06/2016   MCV 87.1 06/06/2016   PLT 102 (L) 06/06/2016     Recent Labs Lab 06/09/16 0408  NA 134*  K 4.6  CL 92*  CO2 34*  BUN 35*  CREATININE 1.61*  CALCIUM 9.4  GLUCOSE 133*   Lab Results  Component Value Date   CHOL 262 (H) 12/16/2015   HDL 51 12/16/2015   LDLCALC 195 (H) 12/16/2015   TRIG 80 12/16/2015   BNP (last  3 results)  Recent Labs  03/23/16 1015 05/11/16 0958 05/31/16 0906  BNP 721.4* 898.0* 1,763.1*    ProBNP (last 3 results) No results for input(s): PROBNP in the last 8760 hours.   Diagnostic Studies/Procedures   No results found.  Discharge Medications   Allergies as of 06/09/2016   No Known Allergies     Medication List    TAKE these medications   acetaminophen 325 MG tablet Commonly known as:  TYLENOL Take 2 tablets (650 mg total) by mouth every 4 (four) hours as needed for headache or mild pain.     amiodarone 100 MG tablet Commonly known as:  PACERONE Take 2 tablets (200 mg total) by mouth 2 (two) times daily. What changed:  when to take this   atorvastatin 40 MG tablet Commonly known as:  LIPITOR Take 1 tablet (40 mg total) by mouth daily.   ferrous sulfate 325 (65 FE) MG tablet Take 325 mg by mouth daily with breakfast. Reported on 12/16/2015   guaiFENesin-dextromethorphan 100-10 MG/5ML syrup Commonly known as:  ROBITUSSIN DM Take 5 mLs by mouth every 4 (four) hours as needed for cough.   magnesium oxide 400 MG tablet Commonly known as:  MAG-OX Take 1 tablet (400 mg total) by mouth daily.   metolazone 2.5 MG tablet Commonly known as:  ZAROXOLYN Take 1 tablet (2.5 mg total) by mouth 2 (two) times a week. Every Wednesday and Saturday. Take 1 extra tablet for weight 180 lbs or more. Start taking on:  06/12/2016 What changed:  additional instructions   oxyCODONE 5 MG immediate release tablet Commonly known as:  Oxy IR/ROXICODONE Take 1 tablet (5 mg total) by mouth every 4 (four) hours as needed for moderate pain.   polyethylene glycol packet Commonly known as:  MIRALAX / GLYCOLAX Take 17 g by mouth daily as needed for mild constipation.   potassium chloride SA 20 MEQ tablet Commonly known as:  K-DUR,KLOR-CON Take 2 tablets (40 mEq total) by mouth 2 (two) times daily. Take extra tab on Wed and Sat with metolazone   torsemide 20 MG tablet Commonly known as:  DEMADEX Take 60 mg by mouth 2 (two) times daily.   warfarin 3 MG tablet Commonly known as:  COUMADIN Take 1-1.5 tablets (3-4.5 mg total) by mouth See admin instructions. Take 3 mg Daily except Monday and Thursday take 4.5 mg (1.5 tablets) Start taking on:  06/10/2016 What changed:  additional instructions            Durable Medical Equipment        Start     Ordered   06/09/16 1206  Heart failure home health orders  (Heart failure home health orders / Face to face)  Once    Comments:  Heart Failure  Follow-up Care:  Verify follow-up appointments per Patient Discharge Instructions. Confirm transportation arranged. Reconcile home medications with discharge medication list. Remove discontinued medications from use. Assist patient/caregiver to manage medications using pill box. Reinforce low sodium food selection Assessments: Vital signs and oxygen saturation at each visit. Assess home environment for safety concerns, caregiver support and availability of low-sodium foods. Consult Education officer, museum, PT/OT, Dietitian, and CNA based on assessments. Perform comprehensive cardiopulmonary assessment. Notify MD for any change in condition or weight gain of 3 pounds in one day or 5 pounds in one week with symptoms. Daily Weights and Symptom Monitoring: Ensure patient has access to scales. Teach patient/caregiver to weigh daily before breakfast and after voiding using same scale and record.  Teach patient/caregiver to track weight and symptoms and when to notify Provider. Activity: Develop individualized activity plan with patient/caregiver.  Hospice RN.   Please manage INR.  Will need check 06/13/16.  Goal INR 2.0 - 3.0 with hx of CVA.  Other labwork as needed or as directed by HF clinic  Question Answer Comment  Heart Failure Follow-up Care [facility] at [phone]   Obtain the following labs Other see comments   Lab frequency Other see comments   Fax lab results to AHF Clinic at [phone]   Diet Low Sodium Heart Healthy   Fluid restrictions: 2000 mL Fluid      06/09/16 1206      Disposition   The patient will be discharged in tenuous condition with hospice care in place at home.   Discharge Instructions    (HEART FAILURE PATIENTS) Call MD:  Anytime you have any of the following symptoms: 1) 3 pound weight gain in 24 hours or 5 pounds in 1 week 2) shortness of breath, with or without a dry hacking cough 3) swelling in the hands, feet or stomach 4) if you have  to sleep on extra pillows at night in order to breathe.    Complete by:  As directed    Diet - low sodium heart healthy    Complete by:  As directed    Increase activity slowly    Complete by:  As directed      Follow-up Information    [facility] Follow up on 06/20/2016.   Specialty:  Cardiology Why:  at 1030 for post hospital follow up. Please bring all of your medications to your visit. The code for parking is 5000. Contact information: [address] [id number] [place] [phone]            Duration of Discharge Encounter: Greater than 35 minutes   Signed, Annamaria Helling 06/09/2016, 3:05 PM   Agree with above. He has end-stage HF. Not candidate for home inotropes at this point.   Ubah Radke,MD 10:30 AM

## 2016-06-09 NOTE — Progress Notes (Signed)
ANTICOAGULATION CONSULT NOTE - Follow Up Consult  Pharmacy Consult for Coumadin Indication:hx PE and CVA  No Known Allergies  Patient Measurements: Height: 5\' 11"  (180.3 cm) Weight: 174 lb 9.7 oz (79.2 kg) IBW/kg (Calculated) : 75.3   Vital Signs: Temp: 98.2 F (36.8 C) (01/05 0733) Temp Source: Oral (01/05 0733) BP: 100/72 (01/05 0820) Pulse Rate: 70 (01/05 0733)  Labs:  Recent Labs  06/07/16 0442 06/08/16 0431 06/09/16 0408  LABPROT 19.7* 20.8* 20.9*  INR 1.65 1.76 1.77  CREATININE 1.77* 1.58* 1.61*    Estimated Creatinine Clearance: 45.5 mL/min (by C-G formula based on SCr of 1.61 mg/dL (H)).  Assessment: 70yom continues on coumadin for hx PE and CVA. INR remains unchanged at 1.77. Levaquin course complete. Continues on amiodarone 200mg  po bid. Discussed need for bridge and ok to not bridge for now per Dr. Haroldine Laws.  PTA dose: 3mg  daily except 4.5mg  on Monday/Thursday  Goal of Therapy:  INR 2-3 Monitor platelets by anticoagulation protocol: Yes   Plan:  Noted plan to go home today. Would give coumadin 4.5mg  again tonight then resume home dose of 3mg  daily except 4.5mg  on Monday/Thursday.  Deboraha Sprang 06/09/2016,11:03 AM

## 2016-06-09 NOTE — Progress Notes (Signed)
Discharged home by wheelchair stable. Discharge instructions given to pt. Belongings and o2 tanks x3 taken home.

## 2016-06-09 NOTE — Care Management Note (Signed)
Case Management Note  Patient Details  Name: Rick [name] MRN: [id number] Date of Birth: 12/07/1945  Subjective/Objective:     Adm w chf               Action/Plan:to go home w wife and hospice , port o2 tanks in room that [facility] ordered   Expected Discharge Date:                  Expected Discharge Plan:  Home w Hospice Care  In-House Referral:  Hospice / Palliative Care  Discharge planning Services  CM Consult  Post Acute Care Choice:  Hospice Choice offered to:  Patient, Spouse  DME Arranged:  IV pump/equipment, Oxygen DME Agency:  Other - Comment  HH Arranged:  RN, Social Work [employer] Agency:  Other - See comment  Status of Service:  Completed, signed off  If discussed at [employer], dates discussed:    Additional Comments:faxed dc instructions,prescription to [facility]. To go home by auto w oxygen.  Lacretia Leigh, RN 06/09/2016, 12:57 PM

## 2016-06-09 NOTE — Progress Notes (Signed)
Brief Nutrition Follow-Up Note  Chart reviewed. Per palliative care notes, pt has elected to discharge home with hospice services, likely today per cardiology. Diet education not appropriate at this time.   If further nutrition needs arise, please consult RD.   Kebron Pulse A. Jimmye Norman, RD, LDN, CDE Pager: [phone] After hours Pager: [phone]

## 2016-06-09 NOTE — Progress Notes (Signed)
AICD deactivated by [employer]. Continue to monitor.

## 2016-06-09 NOTE — Progress Notes (Addendum)
Patient ID: Rick [name], male   DOB: 03-Mar-1946, 71 y.o.   MRN: [id number] \    Advanced Heart Failure Rounding Note  Primary Care: Wende Neighbors, MD Primary Cardiologist: Dr Harl Bowie Primary HF: Dr. Haroldine Laws  Reason for Admission: Acute on Chronic systolic CHF  Subjective:    Sent to ED from Clinic and admitted 05/31/16 with marked volume overload and recurrent low output HF.   Started on milrinone 0.25 mcg/kg/min empirically 05/31/16. Started on Levaquin 06/01/16 with productive cough and rhonchi/wheezing lung sounds with + CXR findings.  Family meeting 06/08/16. Pt wants to go home with hospice, NO milrinone.   Coox 50.5% on 0.125 mcg/kg/min. CVP 8-9  Feeling good this morning. States his breathing is much better. Urine output good.   Creatinine 1.69 -> 1.77 -> 1.58 -> 1.61.  Out 500 cc yesterday weight shows up 3 lbs. Down 28 lbs total.   INR 1.77. Pharmacy dosing.   Objective:   Weight Range: 174 lb 9.7 oz (79.2 kg) Body mass index is 24.35 kg/m.   Vital Signs:   Temp:  [97.7 F (36.5 C)-98.1 F (36.7 C)] 98.1 F (36.7 C) (01/05 0317) Pulse Rate:  [80-86] 80 (01/04 1717) Resp:  [13-22] 17 (01/05 0317) BP: (97-110)/(69-84) 110/79 (01/05 0317) SpO2:  [95 %-100 %] 98 % (01/05 0317) Weight:  [174 lb 9.7 oz (79.2 kg)] 174 lb 9.7 oz (79.2 kg) (01/05 0500) Last BM Date: 06/08/16  Weight change: Filed Weights   06/07/16 0335 06/08/16 0354 06/09/16 0500  Weight: 176 lb 9.4 oz (80.1 kg) 171 lb 15.3 oz (78 kg) 174 lb 9.7 oz (79.2 kg)    Intake/Output:   Intake/Output Summary (Last 24 hours) at 06/09/16 0716 Last data filed at 06/09/16 0600  Gross per 24 hour  Intake          1187.15 ml  Output             1677 ml  Net          -489.85 ml     Physical Exam: CVP 8-9 General:  Elderly appearing. Lying flat in bed  HEENT: Normal Neck: Supple. JVP with prominent CV waves. 8-9 cm. Carotids 2+ bilat; no bruits. No thyromegaly or nodule noted.  Cor: PMI laterally  displaced. RRR. 3/6 MR/TR. +S3.  Lungs:  clear.  Abdomen: soft, NT, ND, no HSM. No bruits or masses. +BS  Extremities: no cyanosis, clubbing, rash. No edema. RUE PICC Neuro: alert & orientedx3, cranial nerves grossly intact. moves all 4 extremities w/o difficulty. Affect very pleasant.   Telemetry: Reviewed, NSR 80s + PVCs.  Labs: CBC No results for input(s): WBC, NEUTROABS, HGB, HCT, MCV, PLT in the last 72 hours. Basic Metabolic Panel  Recent Labs  06/08/16 0431 06/09/16 0408  NA 131* 134*  K 3.8 4.6  CL 90* 92*  CO2 32 34*  GLUCOSE 301* 133*  BUN 32* 35*  CREATININE 1.58* 1.61*  CALCIUM 9.3 9.4  MG 2.2  --    Liver Function Tests No results for input(s): AST, ALT, ALKPHOS, BILITOT, PROT, ALBUMIN in the last 72 hours. No results for input(s): LIPASE, AMYLASE in the last 72 hours. Cardiac Enzymes No results for input(s): CKTOTAL, CKMB, CKMBINDEX, TROPONINI in the last 72 hours.  BNP: BNP (last 3 results)  Recent Labs  03/23/16 1015 05/11/16 0958 05/31/16 0906  BNP 721.4* 898.0* 1,763.1*    ProBNP (last 3 results) No results for input(s): PROBNP in the last 8760 hours.   D-Dimer  No results for input(s): DDIMER in the last 72 hours. Hemoglobin A1C No results for input(s): HGBA1C in the last 72 hours. Fasting Lipid Panel No results for input(s): CHOL, HDL, LDLCALC, TRIG, CHOLHDL, LDLDIRECT in the last 72 hours. Thyroid Function Tests No results for input(s): TSH, T4TOTAL, T3FREE, THYROIDAB in the last 72 hours.  Invalid input(s): FREET3  Other results:     Imaging/Studies:  No results found.    Medications:     Scheduled Medications: . amiodarone  200 mg Oral BID  . atorvastatin  40 mg Oral Daily  . ferrous sulfate  325 mg Oral Q breakfast  . insulin aspart  0-15 Units Subcutaneous TID WC  . insulin aspart  0-5 Units Subcutaneous QHS  . magnesium oxide  400 mg Oral Daily  . polyethylene glycol  17 g Oral BID  . potassium chloride  40 mEq  Oral BID  . sodium chloride flush  3 mL Intravenous Q12H  . torsemide  80 mg Oral BID  . Warfarin - Pharmacist Dosing Inpatient   Does not apply q1800    Infusions: . milrinone 0.125 mcg/kg/min (06/08/16 1819)    PRN Medications: sodium chloride, acetaminophen, albuterol, guaiFENesin-dextromethorphan, magnesium hydroxide, morphine CONCENTRATE, ondansetron (ZOFRAN) IV, sodium chloride flush   Assessment/Plan   1. 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  - CVP 8-9. Will give IV lasix once more this am to "top off" for home.   - Will send home on torsemide 80 mg BID.  With decreased cardiac output, suspect he will re-accumulate fluid over the next few days to weeks.   - Coox marginal at 50.5% on milrinone 0.125 mcg/kg/min. Plan is for home with hospice, so no milrinone.  Will stop milrinone this now for possible home today.  - No ACE/ARB/Spiro with AKI.  - No BB with low output.  - Home with hospice. DNR/DNI. Will deactivate ICD prior to d/c.  - Not candidate for advanced therapies with age and end stage RV failure.  Has previously had trouble managing a PICC in the hospital, so thought to be poor candidate for home PICC. Has done well so far this admission.  2. AKI on CKD stage III: - Creatine relatively stable.  3. Respiratory failure with hypoxia: Requiring 02 via  to keep sats up.   - Completed course of Levaquin for PNA.  - Continue prn nebs.   - Did not need 02 prior to admit.  Will need definitive ambulation with/without 02 for sats.  4. Hx of CVA - On coumadin. INR 1.77 today. Dosing per pharmacy.    - Will discuss need for coumadin with hospice. May continue for now with history of stroke.  5. VT: had run of 130 beats on 12/28.  - Quiescent. Continue on po amio.    - Goal K > 4.0 and Mg > 2.0. 6. Code Status:  - DNR/DNI. Home with  hospice.  Orders for ICD deactivation placed.  7. Acute gout flare - Given dose of prednisone.  - Continue colchicine.  8. Hyperglycemia - SSI added 06/07/16  Stop milrinone. Home with hospice, likely today. Will deactivate ICD prior to d/c.   Shirley Friar, PA-C 06/09/2016 7:16 AM   Advanced Heart Failure Team Pager [phone] (M-F; 7a - 4p)  Please contact [facility] for night-coverage after hours (4p -7a ) and weekends on [email or web address]  Patient seen and  examined with Oda Kilts, PA-C. We discussed all aspects of the encounter. I agree with the assessment and plan as stated above.   He feels ok today. Volume improved. Co-ox low on low-dose milrinone but not candidate for home inotropes.Will stop.  Will send home with Hospice today. ICD has been deactivated.

## 2016-06-12 ENCOUNTER — Ambulatory Visit (INDEPENDENT_AMBULATORY_CARE_PROVIDER_SITE_OTHER): Payer: Medicare Other | Admitting: *Deleted

## 2016-06-12 ENCOUNTER — Telehealth: Payer: Self-pay | Admitting: *Deleted

## 2016-06-12 DIAGNOSIS — Z5181 Encounter for therapeutic drug level monitoring: Secondary | ICD-10-CM

## 2016-06-12 LAB — POCT INR: INR: 1.8

## 2016-06-12 NOTE — Telephone Encounter (Signed)
[place] called stating that they need to speak with coumdin Nurse in regards to Mr. Nole and his coumdin.  Please call Larena Sox  [phone].

## 2016-06-19 ENCOUNTER — Ambulatory Visit (INDEPENDENT_AMBULATORY_CARE_PROVIDER_SITE_OTHER): Payer: Medicare Other | Admitting: *Deleted

## 2016-06-19 DIAGNOSIS — Z5181 Encounter for therapeutic drug level monitoring: Secondary | ICD-10-CM

## 2016-06-19 LAB — POCT INR: INR: 1.7

## 2016-06-20 ENCOUNTER — Ambulatory Visit (HOSPITAL_COMMUNITY)
Admit: 2016-06-20 | Discharge: 2016-06-20 | Disposition: A | Discharge status: Home / Self Care | Source: Ambulatory Visit | Attending: Cardiology | Admitting: Cardiology

## 2016-06-20 DIAGNOSIS — F1010 Alcohol abuse, uncomplicated: Secondary | ICD-10-CM | POA: Insufficient documentation

## 2016-06-20 DIAGNOSIS — I252 Old myocardial infarction: Secondary | ICD-10-CM | POA: Insufficient documentation

## 2016-06-20 DIAGNOSIS — N183 Chronic kidney disease, stage 3 (moderate): Secondary | ICD-10-CM | POA: Diagnosis not present

## 2016-06-20 DIAGNOSIS — Z8673 Personal history of transient ischemic attack (TIA), and cerebral infarction without residual deficits: Secondary | ICD-10-CM | POA: Insufficient documentation

## 2016-06-20 DIAGNOSIS — Z87891 Personal history of nicotine dependence: Secondary | ICD-10-CM | POA: Insufficient documentation

## 2016-06-20 DIAGNOSIS — Z66 Do not resuscitate: Secondary | ICD-10-CM

## 2016-06-20 DIAGNOSIS — I272 Pulmonary hypertension, unspecified: Secondary | ICD-10-CM | POA: Diagnosis not present

## 2016-06-20 DIAGNOSIS — K7460 Unspecified cirrhosis of liver: Secondary | ICD-10-CM | POA: Insufficient documentation

## 2016-06-20 DIAGNOSIS — Z7901 Long term (current) use of anticoagulants: Secondary | ICD-10-CM | POA: Insufficient documentation

## 2016-06-20 DIAGNOSIS — Z951 Presence of aortocoronary bypass graft: Secondary | ICD-10-CM | POA: Insufficient documentation

## 2016-06-20 DIAGNOSIS — Z79899 Other long term (current) drug therapy: Secondary | ICD-10-CM | POA: Insufficient documentation

## 2016-06-20 DIAGNOSIS — I2510 Atherosclerotic heart disease of native coronary artery without angina pectoris: Secondary | ICD-10-CM | POA: Diagnosis not present

## 2016-06-20 DIAGNOSIS — Z806 Family history of leukemia: Secondary | ICD-10-CM | POA: Insufficient documentation

## 2016-06-20 DIAGNOSIS — E785 Hyperlipidemia, unspecified: Secondary | ICD-10-CM | POA: Insufficient documentation

## 2016-06-20 DIAGNOSIS — I472 Ventricular tachycardia, unspecified: Secondary | ICD-10-CM

## 2016-06-20 DIAGNOSIS — G4733 Obstructive sleep apnea (adult) (pediatric): Secondary | ICD-10-CM | POA: Insufficient documentation

## 2016-06-20 DIAGNOSIS — Z95810 Presence of automatic (implantable) cardiac defibrillator: Secondary | ICD-10-CM | POA: Diagnosis not present

## 2016-06-20 DIAGNOSIS — I255 Ischemic cardiomyopathy: Secondary | ICD-10-CM | POA: Diagnosis not present

## 2016-06-20 DIAGNOSIS — F419 Anxiety disorder, unspecified: Secondary | ICD-10-CM | POA: Insufficient documentation

## 2016-06-20 DIAGNOSIS — Z8249 Family history of ischemic heart disease and other diseases of the circulatory system: Secondary | ICD-10-CM | POA: Diagnosis not present

## 2016-06-20 DIAGNOSIS — I5082 Biventricular heart failure: Secondary | ICD-10-CM

## 2016-06-20 DIAGNOSIS — Z86711 Personal history of pulmonary embolism: Secondary | ICD-10-CM | POA: Insufficient documentation

## 2016-06-20 DIAGNOSIS — I130 Hypertensive heart and chronic kidney disease with heart failure and stage 1 through stage 4 chronic kidney disease, or unspecified chronic kidney disease: Secondary | ICD-10-CM | POA: Diagnosis present

## 2016-06-20 DIAGNOSIS — I509 Heart failure, unspecified: Secondary | ICD-10-CM | POA: Diagnosis not present

## 2016-06-20 NOTE — Progress Notes (Signed)
Advanced Heart Failure Medication Review by a Pharmacist  Does the patient  feel that his/her medications are working for him/her?  yes  Has the patient been experiencing any side effects to the medications prescribed?  no  Does the patient measure his/her own blood pressure or blood glucose at home?  yes   Does the patient have any problems obtaining medications due to transportation or finances?   no  Understanding of regimen: fair Understanding of indications: fair Potential of compliance: good Patient understands to avoid NSAIDs. Patient understands to avoid decongestants.  Issues to address at subsequent visits: None   Pharmacist comments:  Mr. Waldren is a pleasant 71 yo M presenting with his wife and without a medication list. He is now receiving home hospice who helps with his medications. He did not have any medication-related questions or concerns for me at this time.   [name]. Velva Harman, PharmD, BCPS, CPP Clinical Pharmacist Pager: [phone] Phone: [phone] 06/20/2016 10:48 AM      Time with patient: 10 minutes Preparation and documentation time: 2 minutes Total time: 12 minutes

## 2016-06-20 NOTE — Progress Notes (Signed)
Patient ID: KUE FOX, male   DOB: [birth year], 72 y.o.   MRN: [id number]    Advanced Heart Failure Clinic Note   Referring Physician: Dr Harl Bowie  Primary Care: Wende Neighbors, MD Primary Cardiologist: Dr Harl Bowie Primary HF: Dr. Haroldine Laws  HPI: Rick [name] is a 71 y.o. male with history of ischemic cardiomyopathy status post CABG in 2003 as well as previous stenting. Most recent 2-D echo in 2016 EF 25% with restrictive diastolic dysfunction, moderate RV dysfunction PAS P 64, status post ICD followed by Dr. Rayann Heman with normal check in 02/2015. In June 2017 had subdural hema  Pt admitted 5/4 - 10/13/15 with recurrent volume overload in setting of dietary non-compliance. Echo during that admission showed EF worsening to 15-20% from 25%.  Seen in Dr. Harl Bowie office 10/20/15. Mild volume overload with dietary indiscretion. Torsemide increased to 60/40 mg. Weight at that visit 159 lbs  Admitted 6/10 through 11/20/15 with subdural hematoma and heart failure. Had evacuation of subdural hematoma. Required short term inotropes.  He was discharged off bb and ace. Discharge weight was 147 pounds.   Admitted 8/31 through 02/08/16 with marked volume overload. Diuresed with IV lasix transitioned to torsemide 60 mg tiwce a day + metolazone . No BB with low output. Had episodes of SVT so he was started on amio 200 mg tiwce daily. Met with Pallitative for GOC. Plans to continue full support. Discharge weight was 168 pounds.   Admitted 12/27 through 06/09/2016 with recurrent low output. Hospital course complicated by low output and VT. Required short term norepi and milrinone. Eventually norepi and milrinone weaned off. Diuresed 28 pounds. Due to declining condition Palliative Care consulted. ICD deactivated. Hospice referral made for d/c. Discharge weight was 174 pounds.  Today he returns for post hospital follow up.  Overall feeling ok. Complains of fatigue and dyspnea with exertion. SOB when he preforms ADLs. No fever  or chills. Appetite ok. Weight at home around 179 pounds. Followed by [facility] in [place].  He has DNR form at home. Lives with his wife.    Echo 10/08/15 LVEF 15-20%, Severe LV dilation, Severe hypokinesis with areas of akinesis, Trivial AI, Mild MR, Mod LAE, Severe RV dilation, Severe TR, PA peak pressure 36 mm Hg Echo 03/04/15 LVEF 25%,  Severe hypokinesis, Trivial AI, Mild MR, RV mod dilated and mod reduced, Severe RAE, Mod TR, PA peak pressure 64 mm Hg.    Past Medical History:  Diagnosis Date  . AICD (automatic cardioverter/defibrillator) present   . Anxiety   . CAD (coronary artery disease)   . CHF (congestive heart failure) ([place])   . DOE (dyspnea on exertion)   . Essential hypertension   . Family history of adverse reaction to anesthesia    "sister would go into seizures"  . Hyperlipidemia   . Ischemic cardiomyopathy    EF 20% by echo 2013  . Myocardial infarction acute 2003  . 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 Other pulmonary embolism and infarction    On coumadin  . Pulmonary hypertension    56 mm Hg  . Sleep apnea    "tried machine a couple nights; couldn't do it" (11/03/2015)  . Stroke [facility])    left side facial droop since; don't know when it was" (11/03/2015)  . Tricuspid regurgitation    Severe    Current Outpatient Prescriptions  Medication Sig Dispense Refill  . acetaminophen (TYLENOL) 325 MG tablet Take 2 tablets (650 mg total) by mouth every 4 (four)  hours as needed for headache or mild pain.    [name] amiodarone (PACERONE) 100 MG tablet Take 2 tablets (200 mg total) by mouth 2 (two) times daily. 60 tablet 3  . atorvastatin (LIPITOR) 40 MG tablet Take 1 tablet (40 mg total) by mouth daily. 90 tablet 3  . ferrous sulfate 325 (65 FE) MG tablet Take 325 mg by mouth daily with breakfast. Reported on 12/16/2015    . guaiFENesin-dextromethorphan (ROBITUSSIN DM) 100-10 MG/5ML syrup Take 5 mLs by mouth every 4 (four) hours as needed for cough. 118 mL 0  .  magnesium oxide (MAG-OX) 400 MG tablet Take 1 tablet (400 mg total) by mouth daily. 30 tablet 3  . metolazone (ZAROXOLYN) 2.5 MG tablet Take 1 tablet (2.5 mg total) by mouth 2 (two) times a week. Every Wednesday and Saturday. Take 1 extra tablet for weight 180 lbs or more. 10 tablet 3  . oxyCODONE (OXY IR/ROXICODONE) 5 MG immediate release tablet Take 1 tablet (5 mg total) by mouth every 4 (four) hours as needed for moderate pain. 50 tablet 0  . polyethylene glycol (MIRALAX / GLYCOLAX) packet Take 17 g by mouth daily as needed for mild constipation.    . potassium chloride SA (K-DUR,KLOR-CON) 20 MEQ tablet Take 2 tablets (40 mEq total) by mouth 2 (two) times daily. Take extra tab on Wed and Sat with metolazone 120 tablet 6  . torsemide (DEMADEX) 20 MG tablet Take 60 mg by mouth 2 (two) times daily.    [name] warfarin (COUMADIN) 3 MG tablet Take 1-1.5 tablets (3-4.5 mg total) by mouth See admin instructions. Take 3 mg Daily except Monday and Thursday take 4.5 mg (1.5 tablets) 40 tablet 3   No current facility-administered medications for this encounter.    No Known Allergies  Social History   Social History  . Marital status: Married    Spouse name: N/A  . Number of children: 4  . Years of education: N/A   Occupational History  . Disabled.    Social History Main Topics  . Smoking status: Former Smoker    Packs/day: 0.50    Years: 38.00    Types: Cigarettes    Start date: 12/30/1956    Quit date: 06/06/1995  . Smokeless tobacco: Never Used  . Alcohol use 1.8 oz/week    3 Cans of beer per week     Comment: 11/03/2015 "Quit from 1517-6160; weaning myself off again"  . Drug use: No  . Sexual activity: Not Currently   Other Topics Concern  . Not on file   Social History Narrative   Lives with wife and brother in [place] and grandson in [place].        Family History  Problem Relation Age of Onset  . Heart disease Mother     CAD  . Leukemia Father     There were no vitals  filed for this visit. Wt Readings from Last 3 Encounters:  06/09/16 174 lb 9.7 oz (79.2 kg)  05/31/16 210 lb 8 oz (95.5 kg)  05/11/16 182 lb 9.6 oz (82.8 kg)     PHYSICAL EXAM: General:  Elderly appearing. NAD. Walked slowly in the clinic. [name]   HEENT: normal Neck: supple. JVP ~10. With  CV wave.  Carotids 2+ bilat; no bruits. No thyromegaly or nodule noted.   Cor: PMI nondisplaced. RRR. No rubs. 3/6 MR.  No S3.  Lungs: Clear. On room air.   Abdomen: soft, NT, ND, no HSM. No bruits or masses. +  BS  Extremities: no cyanosis, clubbing, rash.  No lower extremity edema. .  Neuro: alert and oriented x3. cranial nerves grossly intact. moves all 4 extremities w/o difficulty. Affect pleasant.   ASSESSMENT & PLAN: 1. End Stage Biventricular CHF: ICM . ICD deactivated 06/2016. Active with [facility].  NYHA III. Volume status ok. Continue current dose of torsemide, potassium and metolazone.   - No bb with low output.  No spiro or ace with ongoing AKI.  -2. CAD s/p CABG:  - No CP. No ASA with coumadin. Stop statin as he is end stage.  3. ETOH abuse:  - Denies ETOH.  4. Hx of CVA - On coumadin. No bleeding.  5. Subdural hematoma: S/P evacuation hematoma on 11/15/15. 6. CKD Stage III:  7. OSA:  - Intolerant to CPAP. 8. H/O VT:  Cut back amio to 200 mg twice a day. ICD has been deactivated.  9. DNR/DNI  Follow up in 3 months if needed. He and his wife understand our focus is on comfort/quality of life.   Rick Venneman NP-C  06/20/2016

## 2016-06-20 NOTE — Patient Instructions (Addendum)
STOP Lipitor.  DECREASE Amiodarone to 200 mg twice daily.  Follow up 3 months with Amy Clegg NP-C. We will call you closer to this time, or you may call our office to schedule 1 month before you are due to be seen.  Do the following things EVERYDAY: 1) Weigh yourself in the morning before breakfast. Write it down and keep it in a log. 2) Take your medicines as prescribed 3) Eat low salt foods-Limit salt (sodium) to 2000 mg per day.  4) Stay as active as you can everyday 5) Limit all fluids for the day to less than 2 liters

## 2016-06-22 ENCOUNTER — Encounter (HOSPITAL_COMMUNITY): Payer: Medicare Other

## 2016-06-26 ENCOUNTER — Telehealth: Payer: Self-pay | Admitting: *Deleted

## 2016-06-26 ENCOUNTER — Ambulatory Visit (INDEPENDENT_AMBULATORY_CARE_PROVIDER_SITE_OTHER): Payer: Medicare Other | Admitting: *Deleted

## 2016-06-26 DIAGNOSIS — Z5181 Encounter for therapeutic drug level monitoring: Secondary | ICD-10-CM

## 2016-06-26 LAB — POCT INR: INR: 2.6

## 2016-06-26 NOTE — Telephone Encounter (Signed)
Done.  See coumadin note. 

## 2016-06-26 NOTE — Telephone Encounter (Signed)
Ivin Booty nurse from [place]. [facility] [phone]  PT 31.4  INR 2.6  4.5 mg was the last dose he took Sun & Sat.   3mg  taken for 2 days over the last wk & 4.5 the other days

## 2016-06-29 ENCOUNTER — Telehealth: Payer: Self-pay | Admitting: Cardiology

## 2016-06-29 NOTE — Telephone Encounter (Signed)
LMOVM requesting that pt send manual transmission b/c home monitor has not updated in at least 7 days.    

## 2016-07-03 ENCOUNTER — Ambulatory Visit (INDEPENDENT_AMBULATORY_CARE_PROVIDER_SITE_OTHER): Payer: Medicare Other | Admitting: *Deleted

## 2016-07-03 DIAGNOSIS — Z5181 Encounter for therapeutic drug level monitoring: Secondary | ICD-10-CM

## 2016-07-03 LAB — POCT INR: INR: 3.3

## 2016-07-06 ENCOUNTER — Telehealth: Payer: Self-pay | Admitting: Cardiology

## 2016-07-06 NOTE — Telephone Encounter (Signed)
Spoke w/ pt and he informed me that his ICD therapies were turned off at his last OV visit. Informed pt that I would ask MD if he wanted to pt to continue following his ICD since the therapies were turned off. Informed pt that I would call him back once MD let me know. Pt verbalized understanding.

## 2016-07-08 NOTE — Telephone Encounter (Signed)
Recent CHF clinic note reviewed which reveals ICD therapies have been turned off as patient is now in palliative/ hospice care.  He is DNI/DNR. Please discontinue remote monitoring and inform patient that I am happy to see him at any time if I can be of assistance.

## 2016-07-10 ENCOUNTER — Ambulatory Visit (INDEPENDENT_AMBULATORY_CARE_PROVIDER_SITE_OTHER): Admitting: *Deleted

## 2016-07-10 DIAGNOSIS — Z5181 Encounter for therapeutic drug level monitoring: Secondary | ICD-10-CM

## 2016-07-10 LAB — POCT INR: INR: 2

## 2016-07-12 NOTE — Telephone Encounter (Signed)
LMOVM for pt to return call 

## 2016-07-13 NOTE — Telephone Encounter (Signed)
Spoke w/ pt and gave him MD recommendations. Pt verbalized understanding.

## 2016-07-17 ENCOUNTER — Ambulatory Visit (INDEPENDENT_AMBULATORY_CARE_PROVIDER_SITE_OTHER): Admitting: *Deleted

## 2016-07-17 ENCOUNTER — Telehealth: Payer: Self-pay | Admitting: *Deleted

## 2016-07-17 DIAGNOSIS — Z5181 Encounter for therapeutic drug level monitoring: Secondary | ICD-10-CM

## 2016-07-17 LAB — POCT INR: INR: 2

## 2016-07-17 NOTE — Telephone Encounter (Signed)
INR 2. 0 / please with instructions/ Rick [name]

## 2016-07-17 NOTE — Telephone Encounter (Signed)
Done.  See coumadin note. 

## 2016-07-20 ENCOUNTER — Other Ambulatory Visit: Payer: Self-pay | Admitting: Cardiology

## 2016-07-22 DIAGNOSIS — I509 Heart failure, unspecified: Secondary | ICD-10-CM | POA: Diagnosis not present

## 2016-07-22 DIAGNOSIS — R0602 Shortness of breath: Secondary | ICD-10-CM | POA: Diagnosis not present

## 2016-07-24 ENCOUNTER — Telehealth: Payer: Self-pay | Admitting: *Deleted

## 2016-07-24 ENCOUNTER — Ambulatory Visit (INDEPENDENT_AMBULATORY_CARE_PROVIDER_SITE_OTHER): Admitting: *Deleted

## 2016-07-24 DIAGNOSIS — Z5181 Encounter for therapeutic drug level monitoring: Secondary | ICD-10-CM

## 2016-07-24 LAB — POCT INR: INR: 3.4

## 2016-07-24 NOTE — Telephone Encounter (Signed)
INR 3.4 per Opal Sidles [phone]

## 2016-07-24 NOTE — Telephone Encounter (Signed)
Done.  See coumadin note. 

## 2016-07-26 ENCOUNTER — Ambulatory Visit (INDEPENDENT_AMBULATORY_CARE_PROVIDER_SITE_OTHER): Payer: Medicare Other | Admitting: Internal Medicine

## 2016-07-31 ENCOUNTER — Ambulatory Visit (INDEPENDENT_AMBULATORY_CARE_PROVIDER_SITE_OTHER): Admitting: *Deleted

## 2016-07-31 DIAGNOSIS — Z5181 Encounter for therapeutic drug level monitoring: Secondary | ICD-10-CM

## 2016-07-31 LAB — POCT INR: INR: 3.9

## 2016-08-04 ENCOUNTER — Other Ambulatory Visit: Payer: Self-pay | Admitting: Internal Medicine

## 2016-08-07 ENCOUNTER — Ambulatory Visit (INDEPENDENT_AMBULATORY_CARE_PROVIDER_SITE_OTHER): Admitting: *Deleted

## 2016-08-07 ENCOUNTER — Telehealth: Payer: Self-pay | Admitting: *Deleted

## 2016-08-07 DIAGNOSIS — Z5181 Encounter for therapeutic drug level monitoring: Secondary | ICD-10-CM

## 2016-08-07 LAB — POCT INR: INR: 3.9

## 2016-08-07 NOTE — Telephone Encounter (Signed)
Spoke with [facility] nurse.  See coumadin note.

## 2016-08-07 NOTE — Telephone Encounter (Signed)
INR 3.9 per Larena Sox w/ [place]. [facility] [phone]

## 2016-08-14 ENCOUNTER — Telehealth: Payer: Self-pay | Admitting: Cardiology

## 2016-08-14 ENCOUNTER — Encounter: Admitting: *Deleted

## 2016-08-14 LAB — POCT INR: INR: 3.3

## 2016-08-14 NOTE — Telephone Encounter (Signed)
Spoke with pt and reminded pt of remote transmission that is due today. Pt verbalized that he is on hospice and therapies have been turned off. He will not check ICD today.

## 2016-08-15 ENCOUNTER — Ambulatory Visit (INDEPENDENT_AMBULATORY_CARE_PROVIDER_SITE_OTHER): Admitting: *Deleted

## 2016-08-15 ENCOUNTER — Telehealth: Payer: Self-pay | Admitting: *Deleted

## 2016-08-15 DIAGNOSIS — Z5181 Encounter for therapeutic drug level monitoring: Secondary | ICD-10-CM

## 2016-08-15 NOTE — Telephone Encounter (Signed)
Done.  See coumadin note. 

## 2016-08-15 NOTE — Telephone Encounter (Signed)
Checked yesterday   No coumadin was given yesterday Pt  39.7 inr 3.3

## 2016-08-18 ENCOUNTER — Encounter: Payer: Self-pay | Admitting: Cardiology

## 2016-08-22 ENCOUNTER — Ambulatory Visit (INDEPENDENT_AMBULATORY_CARE_PROVIDER_SITE_OTHER): Admitting: *Deleted

## 2016-08-22 DIAGNOSIS — Z5181 Encounter for therapeutic drug level monitoring: Secondary | ICD-10-CM

## 2016-08-22 LAB — POCT INR: INR: 2

## 2016-08-29 ENCOUNTER — Ambulatory Visit (INDEPENDENT_AMBULATORY_CARE_PROVIDER_SITE_OTHER): Admitting: *Deleted

## 2016-08-29 DIAGNOSIS — Z5181 Encounter for therapeutic drug level monitoring: Secondary | ICD-10-CM

## 2016-08-29 LAB — POCT INR: INR: 1.5

## 2016-09-05 ENCOUNTER — Telehealth: Payer: Self-pay | Admitting: *Deleted

## 2016-09-05 ENCOUNTER — Ambulatory Visit (INDEPENDENT_AMBULATORY_CARE_PROVIDER_SITE_OTHER): Admitting: *Deleted

## 2016-09-05 DIAGNOSIS — Z5181 Encounter for therapeutic drug level monitoring: Secondary | ICD-10-CM

## 2016-09-05 LAB — POCT INR: INR: 1.4

## 2016-09-05 NOTE — Telephone Encounter (Signed)
Opal Sidles with Hospice [phone] called with PT/INR  PT 17.1 INR 1.4

## 2016-09-05 NOTE — Telephone Encounter (Signed)
Done.  See coumadin note. 

## 2016-09-11 ENCOUNTER — Ambulatory Visit (HOSPITAL_COMMUNITY)
Admission: RE | Admit: 2016-09-11 | Discharge: 2016-09-11 | Disposition: A | Discharge status: Home / Self Care | Payer: Self-pay | Source: Ambulatory Visit | Attending: Cardiology | Admitting: Cardiology

## 2016-09-11 VITALS — BP 98/68 | HR 63 | Wt 190.4 lb

## 2016-09-11 DIAGNOSIS — Z8249 Family history of ischemic heart disease and other diseases of the circulatory system: Secondary | ICD-10-CM | POA: Insufficient documentation

## 2016-09-11 DIAGNOSIS — I252 Old myocardial infarction: Secondary | ICD-10-CM | POA: Insufficient documentation

## 2016-09-11 DIAGNOSIS — N183 Chronic kidney disease, stage 3 unspecified: Secondary | ICD-10-CM

## 2016-09-11 DIAGNOSIS — I130 Hypertensive heart and chronic kidney disease with heart failure and stage 1 through stage 4 chronic kidney disease, or unspecified chronic kidney disease: Secondary | ICD-10-CM | POA: Insufficient documentation

## 2016-09-11 DIAGNOSIS — Z8673 Personal history of transient ischemic attack (TIA), and cerebral infarction without residual deficits: Secondary | ICD-10-CM | POA: Insufficient documentation

## 2016-09-11 DIAGNOSIS — F419 Anxiety disorder, unspecified: Secondary | ICD-10-CM | POA: Insufficient documentation

## 2016-09-11 DIAGNOSIS — Z7982 Long term (current) use of aspirin: Secondary | ICD-10-CM | POA: Insufficient documentation

## 2016-09-11 DIAGNOSIS — I472 Ventricular tachycardia, unspecified: Secondary | ICD-10-CM

## 2016-09-11 DIAGNOSIS — G4733 Obstructive sleep apnea (adult) (pediatric): Secondary | ICD-10-CM | POA: Insufficient documentation

## 2016-09-11 DIAGNOSIS — Z87891 Personal history of nicotine dependence: Secondary | ICD-10-CM | POA: Insufficient documentation

## 2016-09-11 DIAGNOSIS — F1010 Alcohol abuse, uncomplicated: Secondary | ICD-10-CM | POA: Insufficient documentation

## 2016-09-11 DIAGNOSIS — I272 Pulmonary hypertension, unspecified: Secondary | ICD-10-CM | POA: Insufficient documentation

## 2016-09-11 DIAGNOSIS — Z86711 Personal history of pulmonary embolism: Secondary | ICD-10-CM | POA: Insufficient documentation

## 2016-09-11 DIAGNOSIS — Z66 Do not resuscitate: Secondary | ICD-10-CM | POA: Insufficient documentation

## 2016-09-11 DIAGNOSIS — I2510 Atherosclerotic heart disease of native coronary artery without angina pectoris: Secondary | ICD-10-CM | POA: Insufficient documentation

## 2016-09-11 DIAGNOSIS — E785 Hyperlipidemia, unspecified: Secondary | ICD-10-CM | POA: Insufficient documentation

## 2016-09-11 DIAGNOSIS — K7460 Unspecified cirrhosis of liver: Secondary | ICD-10-CM | POA: Insufficient documentation

## 2016-09-11 DIAGNOSIS — Z951 Presence of aortocoronary bypass graft: Secondary | ICD-10-CM | POA: Insufficient documentation

## 2016-09-11 DIAGNOSIS — I5043 Acute on chronic combined systolic (congestive) and diastolic (congestive) heart failure: Secondary | ICD-10-CM

## 2016-09-11 DIAGNOSIS — Z7901 Long term (current) use of anticoagulants: Secondary | ICD-10-CM | POA: Insufficient documentation

## 2016-09-11 DIAGNOSIS — Z806 Family history of leukemia: Secondary | ICD-10-CM | POA: Insufficient documentation

## 2016-09-11 DIAGNOSIS — I509 Heart failure, unspecified: Secondary | ICD-10-CM | POA: Insufficient documentation

## 2016-09-11 DIAGNOSIS — I255 Ischemic cardiomyopathy: Secondary | ICD-10-CM | POA: Insufficient documentation

## 2016-09-11 DIAGNOSIS — Z79899 Other long term (current) drug therapy: Secondary | ICD-10-CM | POA: Insufficient documentation

## 2016-09-11 MED ORDER — TORSEMIDE 20 MG PO TABS: 60.0000 mg | ORAL_TABLET | Freq: Two times a day (BID) | ORAL | 6 refills | Status: AC

## 2016-09-11 NOTE — Patient Instructions (Signed)
Continue taking torsemide 60 mg (3 tabs) twice daily. May take extra 20 mg tablet once daily if weight greater than 194 lbs.  No labs today.  Follow up 4 weeks.  Do the following things EVERYDAY: 1) Weigh yourself in the morning before breakfast. Write it down and keep it in a log. 2) Take your medicines as prescribed 3) Eat low salt foods-Limit salt (sodium) to 2000 mg per day.  4) Stay as active as you can everyday 5) Limit all fluids for the day to less than 2 liters

## 2016-09-11 NOTE — Progress Notes (Signed)
Patient ID: Rick [name], male   DOB: 1945/07/16, 71 y.o.   MRN: [id number]    Advanced Heart Failure Clinic Note   Referring Physician: Dr Harl Bowie  Primary Care: Wende Neighbors, MD Primary Cardiologist: Dr Harl Bowie Primary HF: Dr. Haroldine Laws  HPI: Rick [name] is a 71 y.o. male with history of ischemic cardiomyopathy status post CABG in 2003 as well as previous stenting. Most recent 2-D echo in 2016 EF 25% with restrictive diastolic dysfunction, moderate RV dysfunction PAS P 64, status post ICD followed by Dr. Rayann Heman with normal check in 02/2015. In June 2017 had subdural hema  Pt admitted 5/4 - 10/13/15 with recurrent volume overload in setting of dietary non-compliance. Echo during that admission showed EF worsening to 15-20% from 25%.  Seen in Dr. Harl Bowie office 10/20/15. Mild volume overload with dietary indiscretion. Torsemide increased to 60/40 mg. Weight at that visit 159 lbs  Admitted 6/10 through 11/20/15 with subdural hematoma and heart failure. Had evacuation of subdural hematoma. Required short term inotropes.  He was discharged off bb and ace. Discharge weight was 147 pounds.   Admitted 8/31 through 02/08/16 with marked volume overload. Diuresed with IV lasix transitioned to torsemide 60 mg tiwce a day + metolazone . No BB with low output. Had episodes of SVT so he was started on amio 200 mg tiwce daily. Met with Pallitative for GOC. Plans to continue full support. Discharge weight was 168 pounds.   Admitted 12/27 through 06/09/2016 with recurrent low output. Hospital course complicated by low output and VT. Required short term norepi and milrinone. Eventually norepi and milrinone weaned off. Diuresed 28 pounds. Due to declining condition Palliative Care consulted. ICD deactivated. Hospice referral made for d/c. Discharge weight was 174 pounds.  Today he returns for HF follow up. Got SOB walking into clinic today, gets SOB with stairs. This is his baseline. Taking all medications, followed by  [facility] [place]. Weights at home 187-192 pounds. Says he starts to feel bad if weight gets around 194 pounds.  Eating a low salt diet, drinking more than 2L a day. Sleeping on one pillow, no PND but has a hard time sleeping. Has not needed to take any extra torsemide. Denies syncope and presyncope.    Echo 10/08/15 LVEF 15-20%, Severe LV dilation, Severe hypokinesis with areas of akinesis, Trivial AI, Mild MR, Mod LAE, Severe RV dilation, Severe TR, PA peak pressure 36 mm Hg Echo 03/04/15 LVEF 25%,  Severe hypokinesis, Trivial AI, Mild MR, RV mod dilated and mod reduced, Severe RAE, Mod TR, PA peak pressure 64 mm Hg.    Past Medical History:  Diagnosis Date  . AICD (automatic cardioverter/defibrillator) present   . Anxiety   . CAD (coronary artery disease)   . CHF (congestive heart failure) ([place])   . DOE (dyspnea on exertion)   . Essential hypertension   . Family history of adverse reaction to anesthesia    "sister would go into seizures"  . Hyperlipidemia   . Ischemic cardiomyopathy    EF 20% by echo 2013  . Myocardial infarction acute 2003  . 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 Other pulmonary embolism and infarction    On coumadin  . Pulmonary hypertension    56 mm Hg  . Sleep apnea    "tried machine a couple nights; couldn't do it" (11/03/2015)  . Stroke [facility])    left side facial droop since; don't know when it was" (11/03/2015)  . Tricuspid regurgitation    Severe  Current Outpatient Prescriptions  Medication Sig Dispense Refill  . acetaminophen (TYLENOL) 325 MG tablet Take 2 tablets (650 mg total) by mouth every 4 (four) hours as needed for headache or mild pain.    [name] amiodarone (PACERONE) 100 MG tablet Take 2 tablets (200 mg total) by mouth 2 (two) times daily. 60 tablet 3  . docusate sodium (COLACE) 100 MG capsule Take 100 mg by mouth 2 (two) times daily.    . magnesium hydroxide (MILK OF MAGNESIA) 400 MG/5ML suspension Take 15 mLs by mouth 2 (two) times daily as  needed for moderate constipation.    . metolazone (ZAROXOLYN) 2.5 MG tablet Take 1 tablet (2.5 mg total) by mouth 2 (two) times a week. Every Wed and Sat 10 tablet 2  . oxyCODONE (OXY IR/ROXICODONE) 5 MG immediate release tablet Take 1 tablet (5 mg total) by mouth every 4 (four) hours as needed for moderate pain. 50 tablet 0  . potassium chloride SA (K-DUR,KLOR-CON) 20 MEQ tablet Take 40 mEq by mouth 2 (two) times daily.    [name] torsemide (DEMADEX) 20 MG tablet Take 3 tablets (60 mg total) by mouth 2 (two) times daily. Take 1 extra tablet for weight greater than 194 lbs. 210 tablet 6  . warfarin (COUMADIN) 3 MG tablet Take 1-1.5 tablets (3-4.5 mg total) by mouth See admin instructions. Take 3 mg Daily except Monday and Thursday take 4.5 mg (1.5 tablets) 40 tablet 3  . guaiFENesin-dextromethorphan (ROBITUSSIN DM) 100-10 MG/5ML syrup Take 5 mLs by mouth every 4 (four) hours as needed for cough. (Patient not taking: Reported on 09/11/2016) 118 mL 0   No current facility-administered medications for this encounter.    No Known Allergies  Social History   Social History  . Marital status: Married    Spouse name: N/A  . Number of children: 4  . Years of education: N/A   Occupational History  . Disabled.    Social History Main Topics  . Smoking status: Former Smoker    Packs/day: 0.50    Years: 38.00    Types: Cigarettes    Start date: 12/30/1956    Quit date: 06/06/1995  . Smokeless tobacco: Never Used  . Alcohol use 1.8 oz/week    3 Cans of beer per week     Comment: 11/03/2015 "Quit from [id number]; weaning myself off again"  . Drug use: No  . Sexual activity: Not Currently   Other Topics Concern  . Not on file   Social History Narrative   Lives with wife and brother in [place] and grandson in [place].        Family History  Problem Relation Age of Onset  . Heart disease Mother     CAD  . Leukemia Father     Vitals:   09/11/16 1021  BP: 98/68  Pulse: 63  SpO2: 100%    Weight: 190 lb 6.4 oz (86.4 kg)   Wt Readings from Last 3 Encounters:  09/11/16 190 lb 6.4 oz (86.4 kg)  06/09/16 174 lb 9.7 oz (79.2 kg)  05/31/16 210 lb 8 oz (95.5 kg)     PHYSICAL EXAM: General: NAD, walked into clinic with wife. Feels SOB with walking.   HEENT: normal, atraumatic  Neck: supple. JVP to ear, prominent CV wave.  Carotids 2+ bilat; no bruits. No thyromegaly Cor: PMI nondisplaced. Regular rate and rhythm. 3/6 systolic murmur.  Lungs: Clear in all lobes.  Abdomen: soft, NT, ND, no HSM. No bruits or masses. +BS  Extremities: no cyanosis, clubbing, rash. Trace pedal and pretibial edema.  Neuro: alert and oriented x3. cranial nerves grossly intact. moves all 4 extremities w/o difficulty. Affect pleasant.   ASSESSMENT & PLAN: 1. End Stage Biventricular CHF: ICM . ICD deactivated 06/2016. Active with [facility].  - NYHA II. Volume status ok, at baseline. He can take an extra torsemide if his weight gets above 194 pounds.  -  Continue current dose of potassium and metolazone.   - No bb with low output.  - No Spiro or ACE-I with AKI.  2. CAD s/p CABG:  - Denies chest pain - Continue ASA.  - Not on Statin.  3. ETOH abuse:  - Denies ETOH use.  4. Hx of CVA - On coumadin. No signs of overt bleeding.  5. Subdural hematoma: S/P evacuation hematoma on 11/15/15. 6. CKD Stage III: creatinine stable baseline 1.5-1.8 No labs today- patient is comfort care.   7. OSA:  - No CPAP at night, wears o2 at night when sleeping.  8. H/O VT:   - Continue Amio 200 mg twice a day. ICD has been deactivated.  9. DNR/DNI  Follow up in 4 months. His wife reiterated today that the goal of his care is comfort. No lab sticks today.   Arbutus Leas NP-C  09/11/2016

## 2016-09-12 ENCOUNTER — Ambulatory Visit (INDEPENDENT_AMBULATORY_CARE_PROVIDER_SITE_OTHER): Payer: Self-pay | Admitting: *Deleted

## 2016-09-12 DIAGNOSIS — Z5181 Encounter for therapeutic drug level monitoring: Secondary | ICD-10-CM

## 2016-09-12 LAB — POCT INR: INR: 2.1

## 2016-09-18 ENCOUNTER — Ambulatory Visit: Payer: Self-pay | Admitting: *Deleted

## 2016-09-18 DIAGNOSIS — Z5181 Encounter for therapeutic drug level monitoring: Secondary | ICD-10-CM

## 2016-10-03 DEATH — deceased

## 2017-02-09 ENCOUNTER — Encounter: Payer: Medicare Other | Admitting: Internal Medicine

## 2017-11-10 IMAGING — DX DG CHEST 2V
2 series · 2 of 2 positions shown · non-contrast
Comparison: 09/01/2015 chest radiograph.

CLINICAL DATA: Lower extremity swelling

EXAM:
CHEST  2 VIEW

[chest pa]
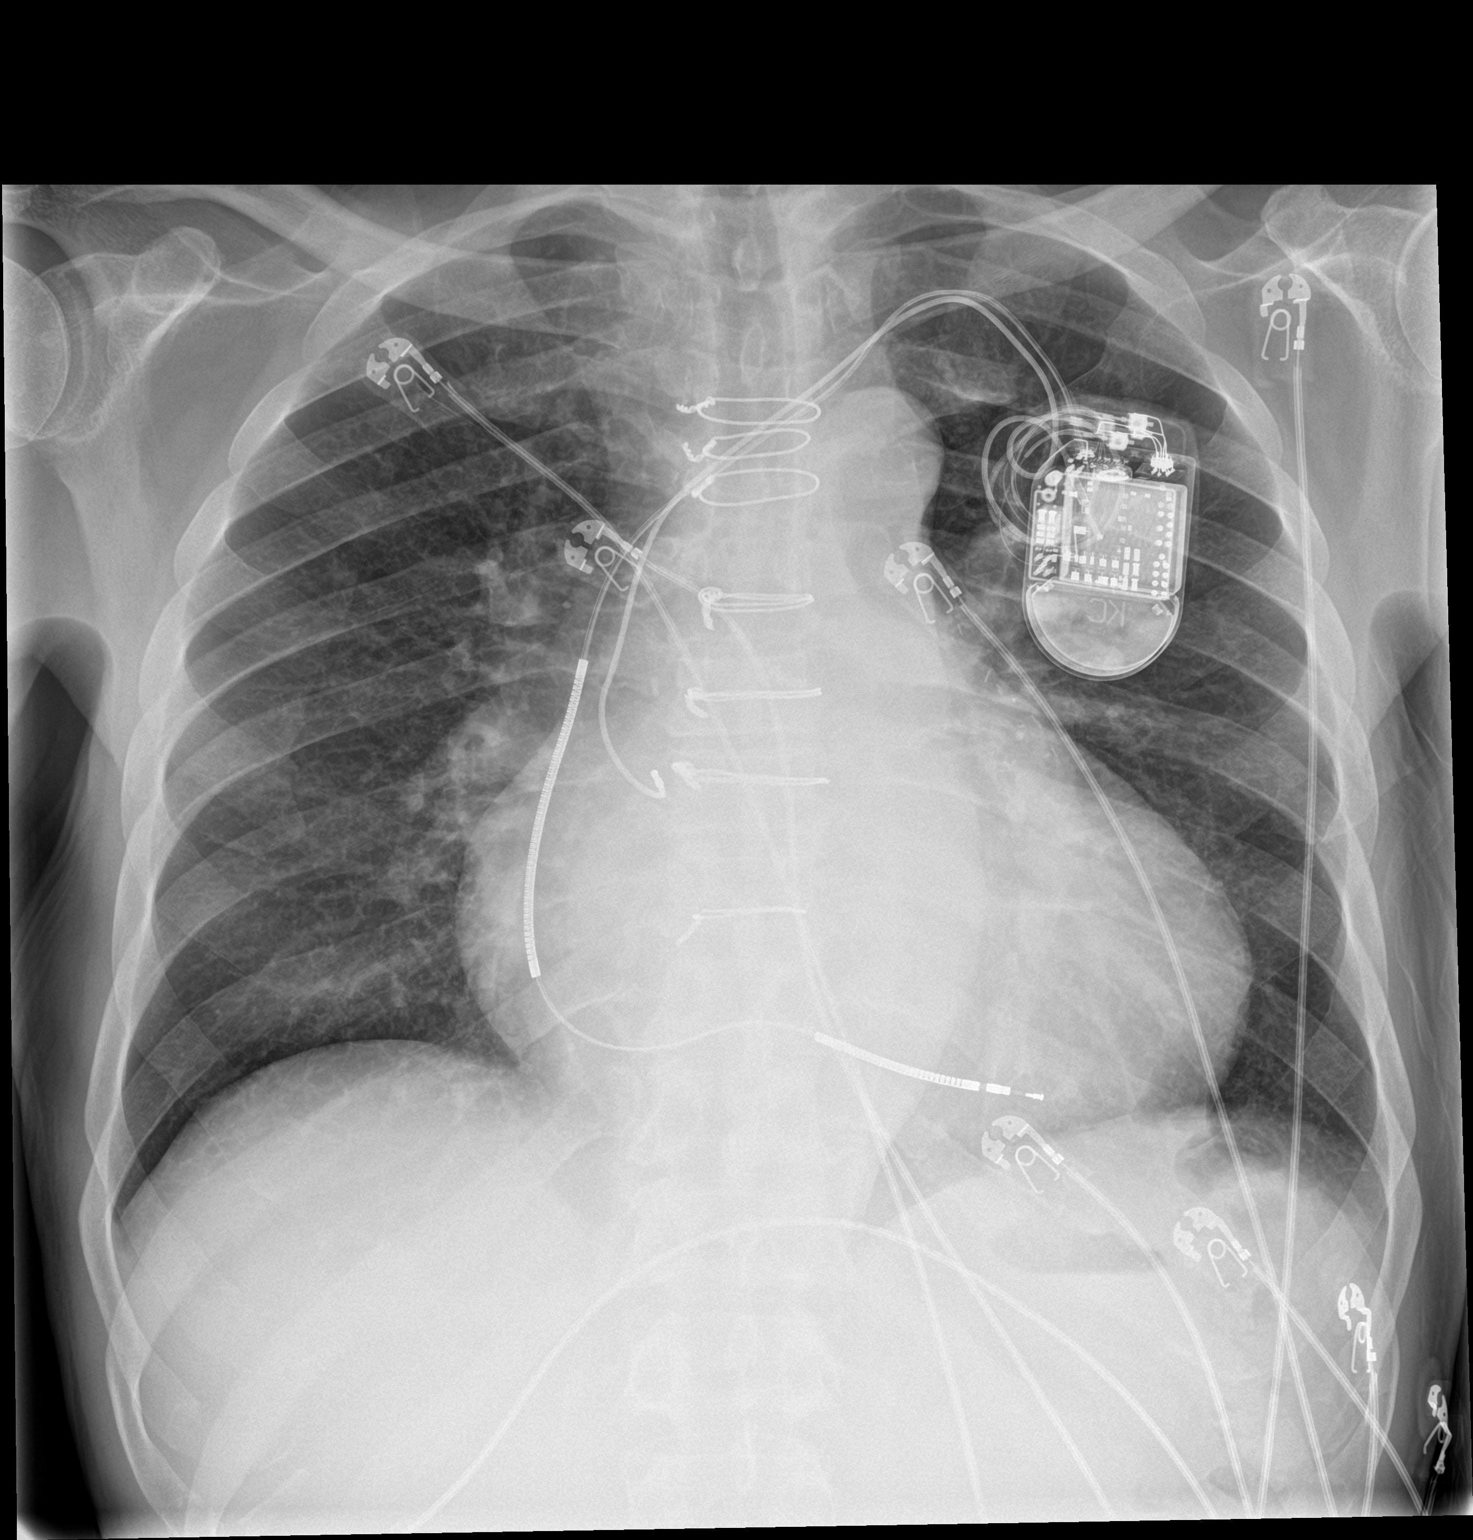

[chest lat]
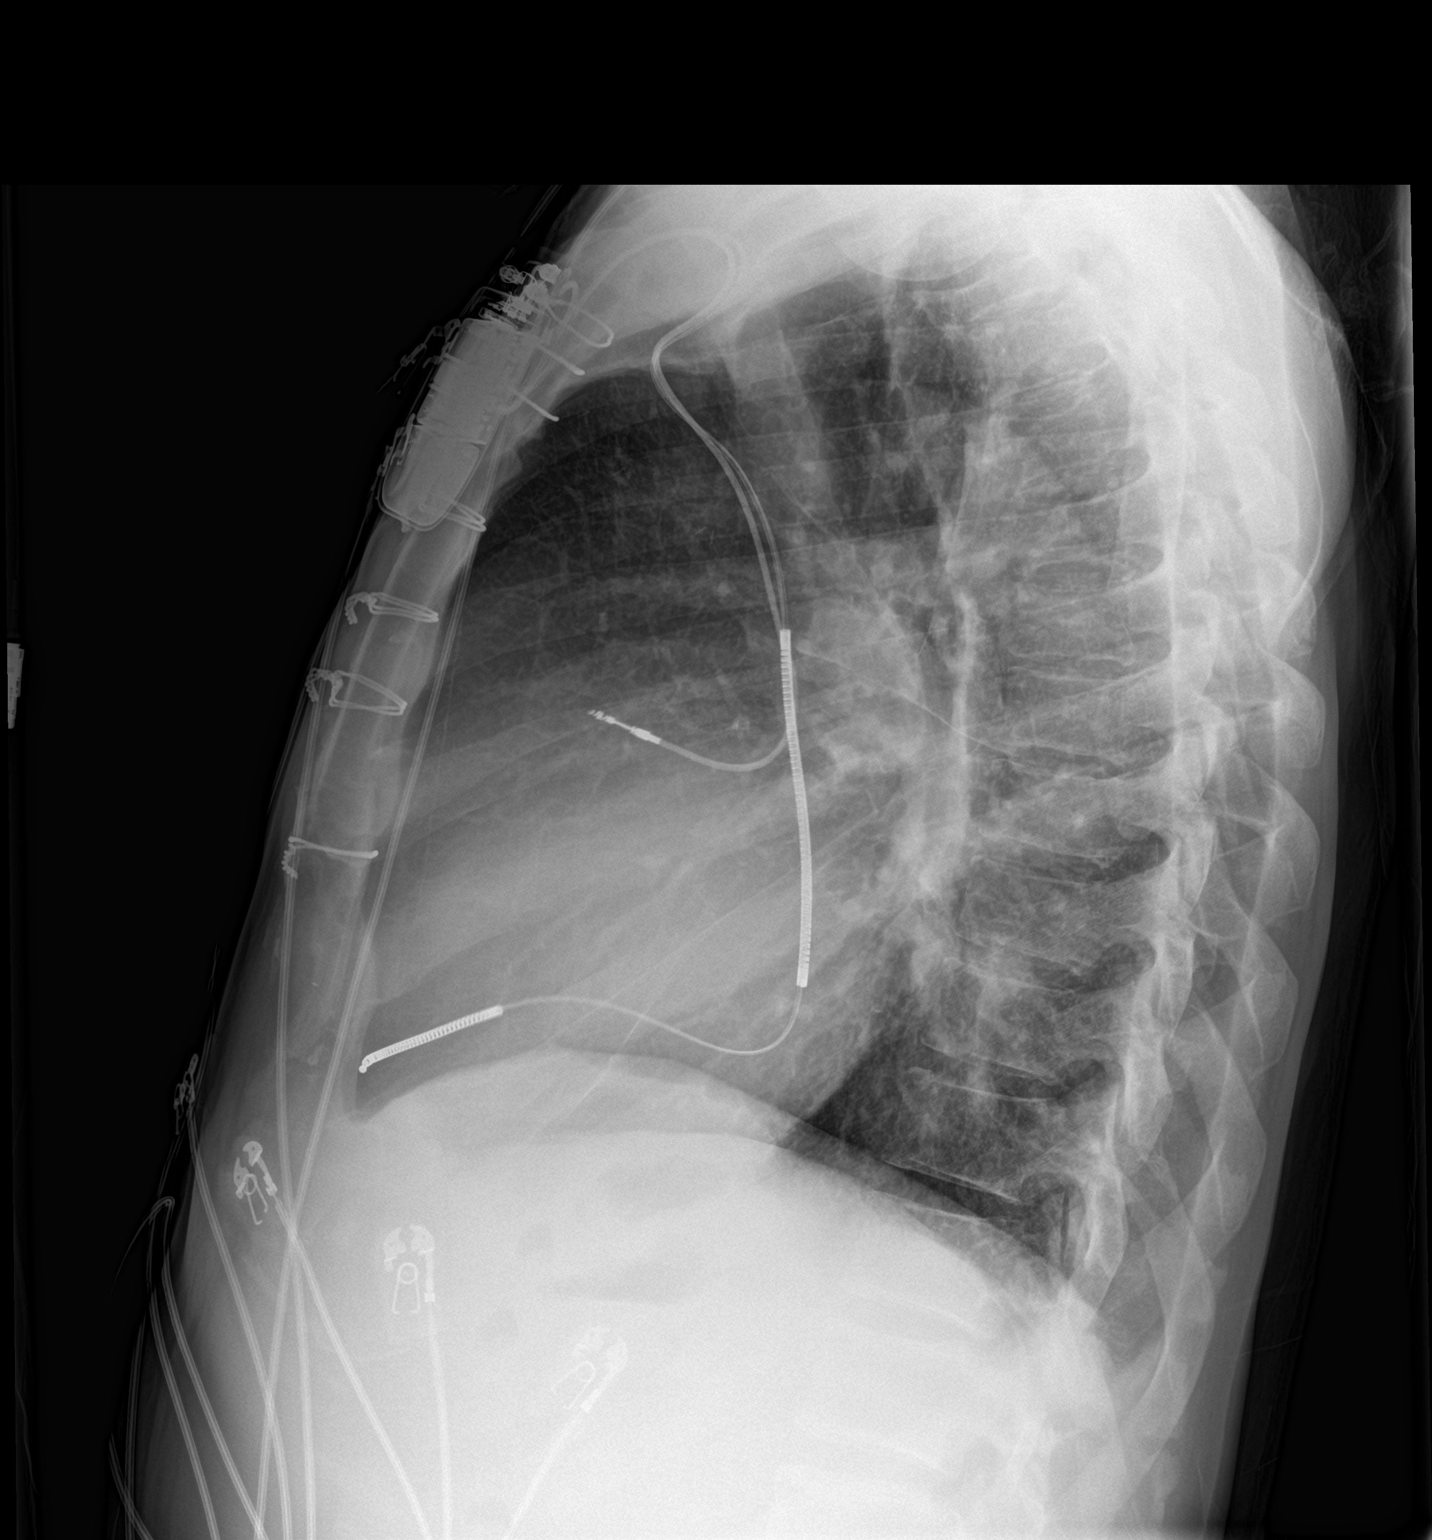

[2 of 2 positions shown; findings below may reference images not displayed]

FINDINGS: Sternotomy wires appear aligned and intact. Stable configuration of
2 lead left subclavian ICD. Stable cardiomediastinal silhouette with
mild-to-moderate cardiomegaly. No pneumothorax. No pleural effusion.
No overt pulmonary edema. No acute consolidative airspace disease.
IMPRESSION: Stable cardiomegaly without overt pulmonary edema.

## 2017-12-18 IMAGING — CT CT HEAD W/O CM
2 series · 15 of 30 positions shown, 19 images · non-contrast
Comparison: None.

CLINICAL DATA: Fell 2 weeks ago, followup 1 cm LEFT subdural
hematoma seen at outside institution. History of hypertension.

EXAM:
CT HEAD WITHOUT CONTRAST
TECHNIQUE: Contiguous axial images were obtained from the base of the skull
through the vertex without intravenous contrast.

[Series 2: head 5.0 h30s · axial · 0.46mm/px · z∈[-29,+56]mm · 13 of 21 slices shown, 17 images (1 of 2)]
[im 2/21  brain]
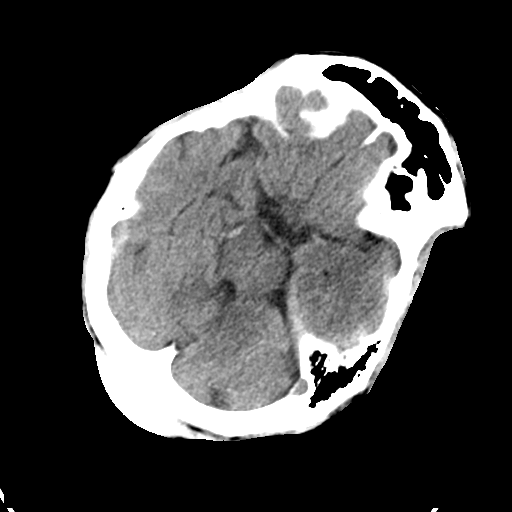
[im 2/21  bone]
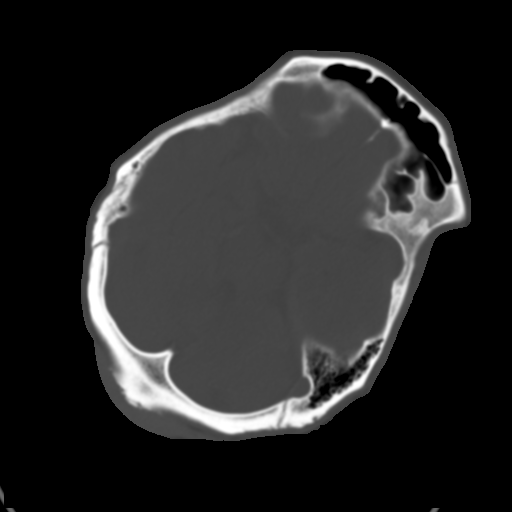
[im 3/21  brain]
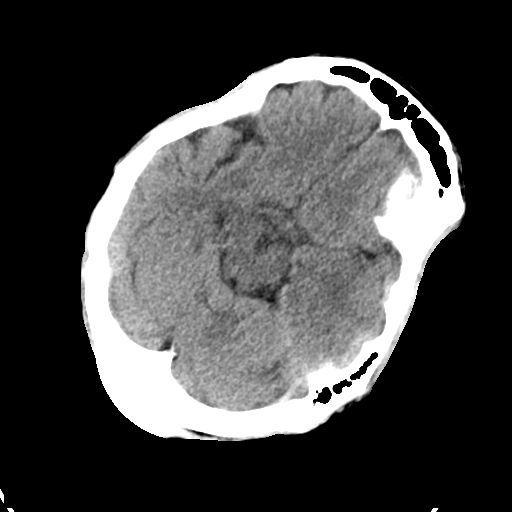
[im 5/21  brain]
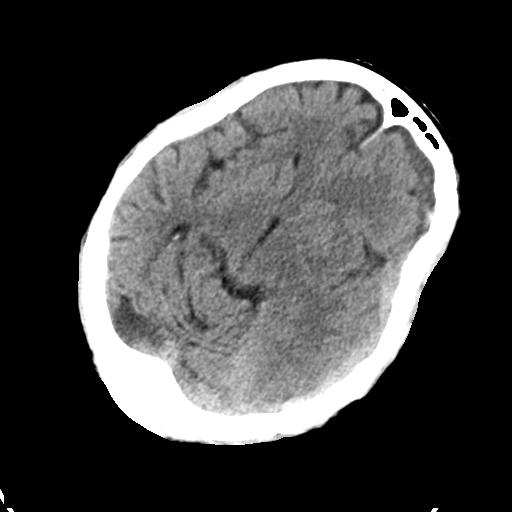
[im 6/21  brain]
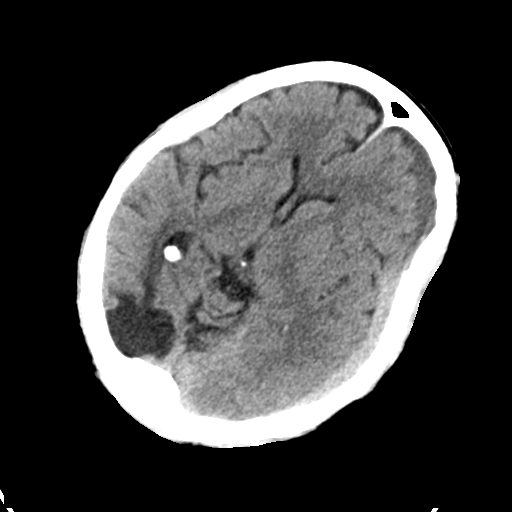
[im 8/21  brain]
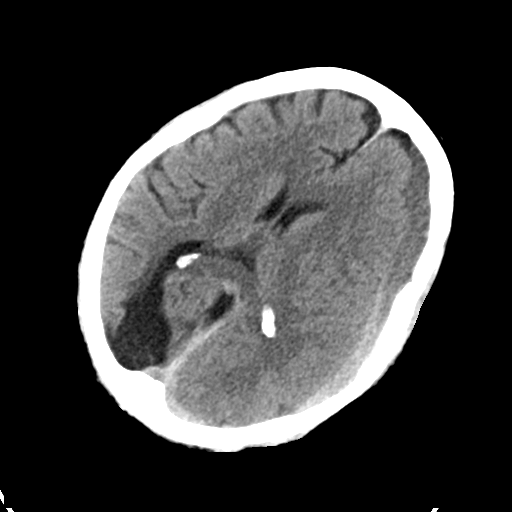
[im 8/21  bone]
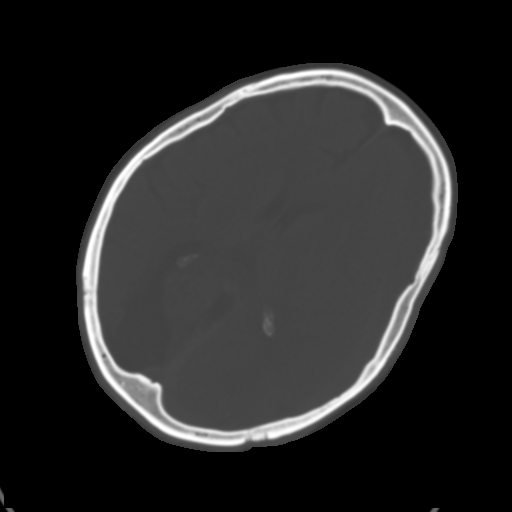
[im 9/21  brain]
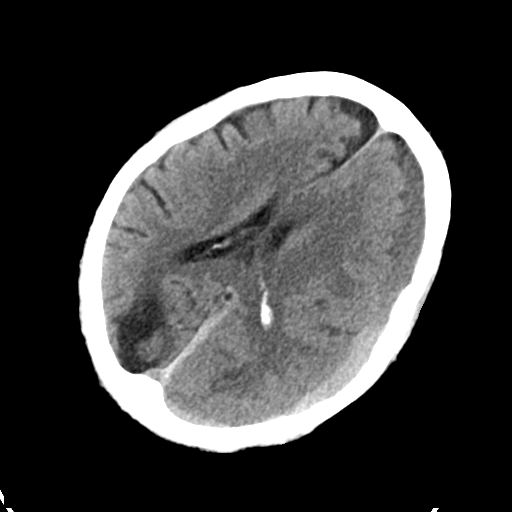
[im 11/21  brain]
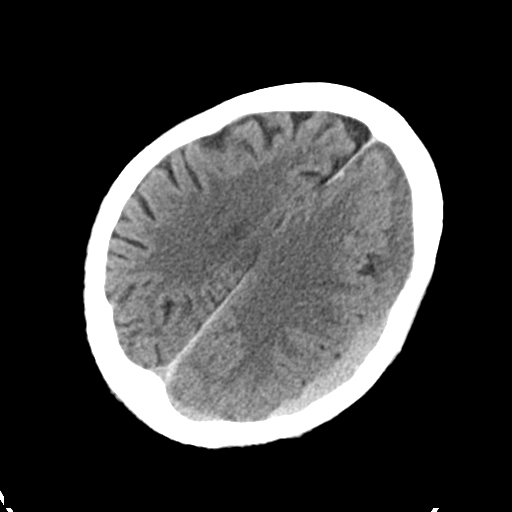
[im 12/21  brain]
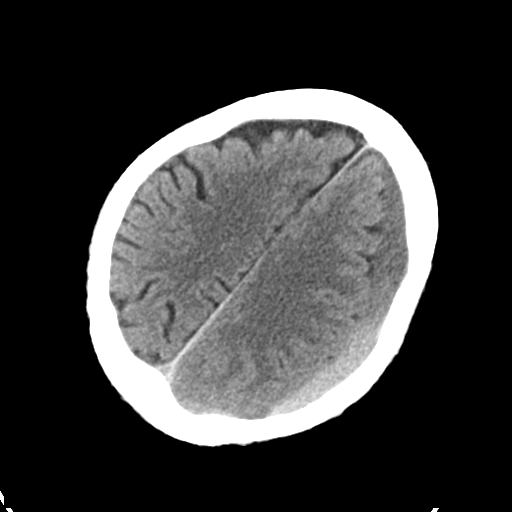
[im 13/21  brain]
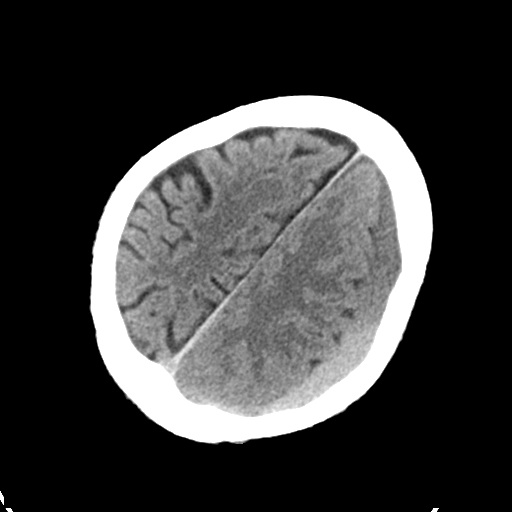
[im 13/21  bone]
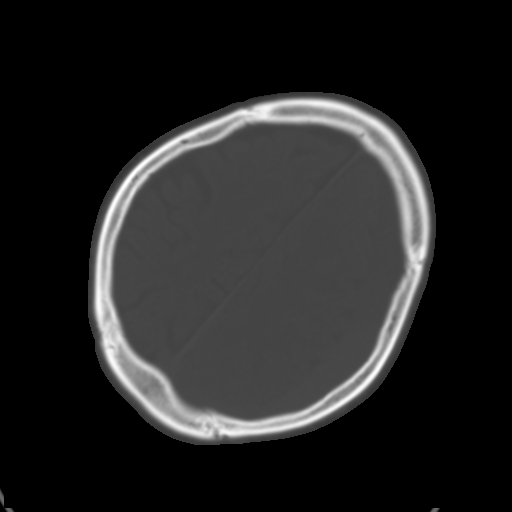
[im 15/21  brain]
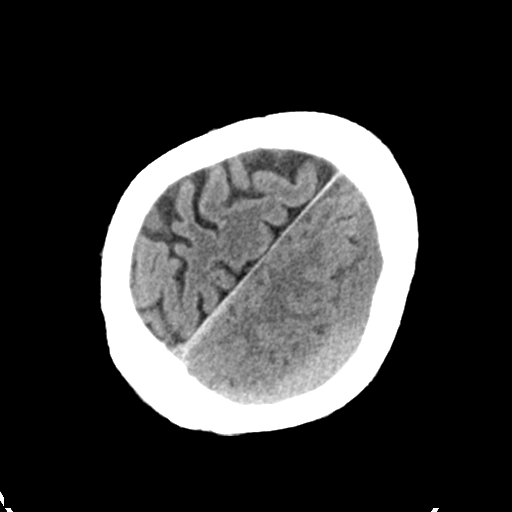
[im 16/21  brain]
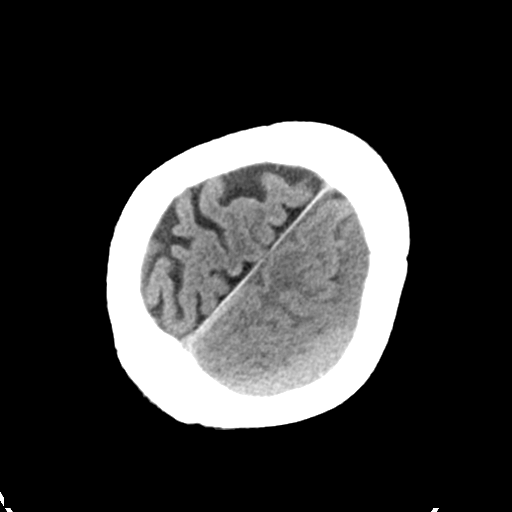
[im 18/21  brain]
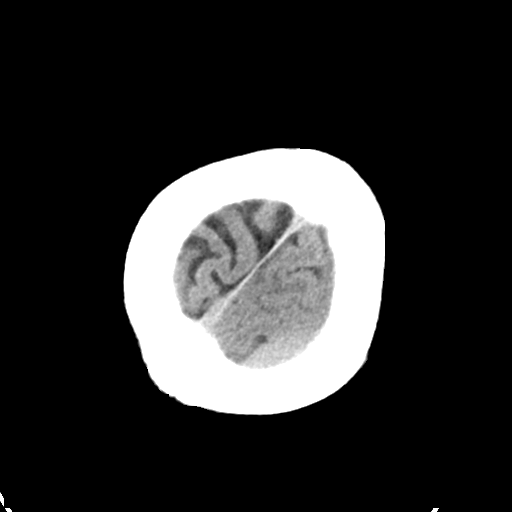
[im 19/21  brain]
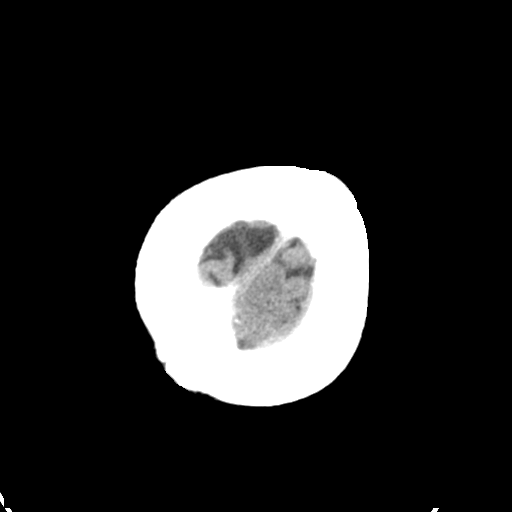
[im 19/21  bone]
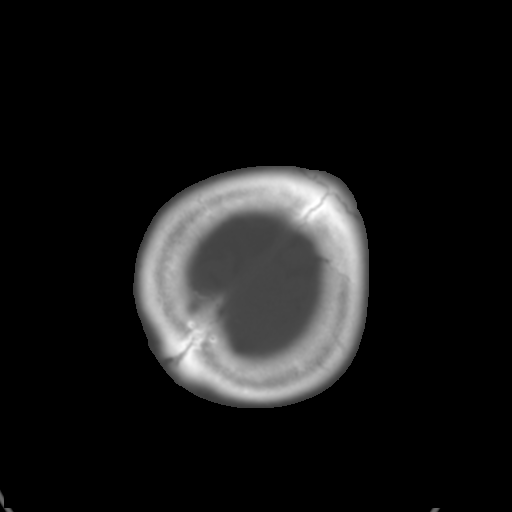

[Series 6: head 5.0 h30s · axial · 0.51mm/px · z∈[-58,-43]mm · 2 of 20 slices shown (2 of 2)]
[im 2/20  brain]
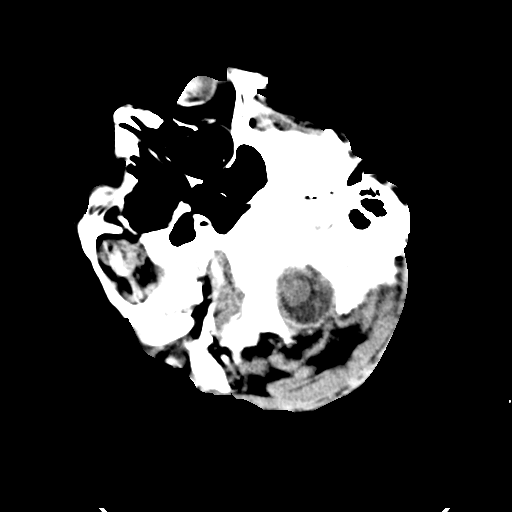
[im 5/20  brain]
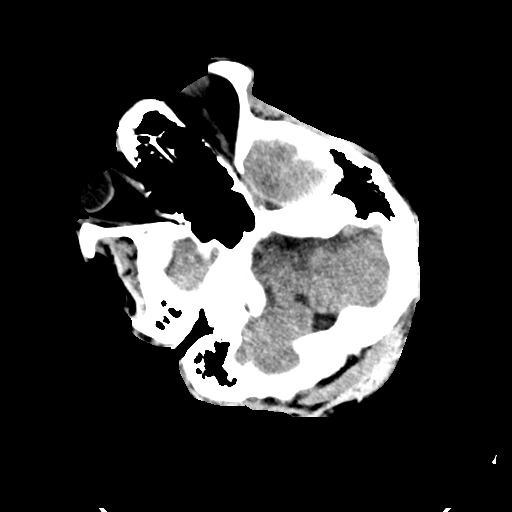

[15 of 30 positions shown; findings below may reference images not displayed]

FINDINGS: INTRACRANIAL CONTENTS: 11 mm intermediate density with hematocrit
level LEFT holohemispheric subdural hematoma. Dense 4 mm LEFT
subdural hematoma extending to the LEFT falx. 10 mm LEFT-to-RIGHT
midline shift with LEFT temporal horn effacement. No ventricular
entrapment or hydrocephalus. RIGHT occipital lobe encephalomalacia
with porencephalic. No intraparenchymal hemorrhage or acute large
vascular territory infarcts. Basal cisterns are patent. Mild
calcific atherosclerosis of the carotid siphons.

ORBITS: The included ocular globes and orbital contents are
non-suspicious.

SINUSES: The mastoid aircells and included paranasal sinuses are
well-aerated.

SKULL/SOFT TISSUES: No skull fracture. No significant soft tissue
swelling.
IMPRESSION: Acute to subacute LEFT holo hemispheric 11 mm subdural hematoma
resulting in 10 mm LEFT-to-RIGHT midline shift. 4 mm LEFT
falcotentorial subdural hematoma.

RIGHT occipital lobe encephalomalacia compatible with old RIGHT
posterior cerebral artery territory infarct.
